# Patient Record
Sex: Male | Born: 1945 | Race: White | Hispanic: No | Smoking: Former smoker
Health system: Southern US, Community
[De-identification: ages and names within clinical notes are randomized; demographics above are authoritative.]

## PROBLEM LIST (undated history)

## (undated) DIAGNOSIS — F329 Major depressive disorder, single episode, unspecified: Secondary | ICD-10-CM

## (undated) DIAGNOSIS — G4730 Sleep apnea, unspecified: Secondary | ICD-10-CM

## (undated) DIAGNOSIS — K219 Gastro-esophageal reflux disease without esophagitis: Secondary | ICD-10-CM

## (undated) DIAGNOSIS — F419 Anxiety disorder, unspecified: Secondary | ICD-10-CM

## (undated) DIAGNOSIS — F32A Depression, unspecified: Secondary | ICD-10-CM

## (undated) DIAGNOSIS — N182 Chronic kidney disease, stage 2 (mild): Secondary | ICD-10-CM

## (undated) DIAGNOSIS — K5790 Diverticulosis of intestine, part unspecified, without perforation or abscess without bleeding: Secondary | ICD-10-CM

## (undated) DIAGNOSIS — J449 Chronic obstructive pulmonary disease, unspecified: Secondary | ICD-10-CM

## (undated) DIAGNOSIS — I10 Essential (primary) hypertension: Secondary | ICD-10-CM

## (undated) DIAGNOSIS — E7800 Pure hypercholesterolemia, unspecified: Secondary | ICD-10-CM

## (undated) DIAGNOSIS — Z9289 Personal history of other medical treatment: Secondary | ICD-10-CM

## (undated) DIAGNOSIS — E079 Disorder of thyroid, unspecified: Secondary | ICD-10-CM

## (undated) DIAGNOSIS — D126 Benign neoplasm of colon, unspecified: Secondary | ICD-10-CM

## (undated) DIAGNOSIS — I2510 Atherosclerotic heart disease of native coronary artery without angina pectoris: Secondary | ICD-10-CM

## (undated) DIAGNOSIS — D649 Anemia, unspecified: Secondary | ICD-10-CM

## (undated) DIAGNOSIS — Z720 Tobacco use: Secondary | ICD-10-CM

## (undated) DIAGNOSIS — M1990 Unspecified osteoarthritis, unspecified site: Secondary | ICD-10-CM

## (undated) DIAGNOSIS — K449 Diaphragmatic hernia without obstruction or gangrene: Secondary | ICD-10-CM

## (undated) HISTORY — DX: Sleep apnea, unspecified: G47.30

## (undated) HISTORY — DX: Essential (primary) hypertension: I10

## (undated) HISTORY — PX: UPPER GASTROINTESTINAL ENDOSCOPY: SHX188

## (undated) HISTORY — DX: Depression, unspecified: F32.A

## (undated) HISTORY — DX: Benign neoplasm of colon, unspecified: D12.6

## (undated) HISTORY — DX: Gastro-esophageal reflux disease without esophagitis: K21.9

## (undated) HISTORY — DX: Personal history of other medical treatment: Z92.89

## (undated) HISTORY — DX: Anemia, unspecified: D64.9

## (undated) HISTORY — DX: Diverticulosis of intestine, part unspecified, without perforation or abscess without bleeding: K57.90

## (undated) HISTORY — DX: Major depressive disorder, single episode, unspecified: F32.9

## (undated) HISTORY — DX: Tobacco use: Z72.0

## (undated) HISTORY — DX: Disorder of thyroid, unspecified: E07.9

## (undated) HISTORY — DX: Chronic obstructive pulmonary disease, unspecified: J44.9

## (undated) HISTORY — DX: Chronic kidney disease, stage 2 (mild): N18.2

## (undated) HISTORY — DX: Anxiety disorder, unspecified: F41.9

## (undated) HISTORY — DX: Atherosclerotic heart disease of native coronary artery without angina pectoris: I25.10

## (undated) HISTORY — PX: COLONOSCOPY: SHX174

## (undated) HISTORY — DX: Unspecified osteoarthritis, unspecified site: M19.90

## (undated) HISTORY — PX: HAND SURGERY: SHX662

## (undated) HISTORY — DX: Diaphragmatic hernia without obstruction or gangrene: K44.9

## (undated) HISTORY — DX: Pure hypercholesterolemia, unspecified: E78.00

---

## 1994-08-06 HISTORY — PX: HERNIA REPAIR: SHX51

## 1997-08-06 HISTORY — PX: CORONARY ARTERY BYPASS GRAFT: SHX141

## 2007-01-07 ENCOUNTER — Ambulatory Visit (HOSPITAL_COMMUNITY): Admission: RE | Admit: 2007-01-07 | Discharge: 2007-01-07 | Payer: Self-pay | Admitting: Cardiovascular Disease

## 2007-02-14 ENCOUNTER — Ambulatory Visit: Payer: Self-pay | Admitting: Emergency Medicine

## 2007-03-24 ENCOUNTER — Ambulatory Visit: Payer: Self-pay | Admitting: Emergency Medicine

## 2007-06-17 ENCOUNTER — Ambulatory Visit: Payer: Self-pay | Admitting: Emergency Medicine

## 2007-09-02 ENCOUNTER — Telehealth (INDEPENDENT_AMBULATORY_CARE_PROVIDER_SITE_OTHER): Payer: Self-pay | Admitting: *Deleted

## 2007-09-04 DIAGNOSIS — Z87891 Personal history of nicotine dependence: Secondary | ICD-10-CM | POA: Insufficient documentation

## 2007-09-04 DIAGNOSIS — I2510 Atherosclerotic heart disease of native coronary artery without angina pectoris: Secondary | ICD-10-CM

## 2007-09-04 DIAGNOSIS — K219 Gastro-esophageal reflux disease without esophagitis: Secondary | ICD-10-CM | POA: Insufficient documentation

## 2007-09-04 DIAGNOSIS — E7800 Pure hypercholesterolemia, unspecified: Secondary | ICD-10-CM

## 2007-09-04 DIAGNOSIS — I25709 Atherosclerosis of coronary artery bypass graft(s), unspecified, with unspecified angina pectoris: Secondary | ICD-10-CM | POA: Insufficient documentation

## 2007-09-04 DIAGNOSIS — E119 Type 2 diabetes mellitus without complications: Secondary | ICD-10-CM

## 2007-09-04 DIAGNOSIS — J449 Chronic obstructive pulmonary disease, unspecified: Secondary | ICD-10-CM

## 2008-01-01 ENCOUNTER — Ambulatory Visit: Payer: Self-pay | Admitting: Emergency Medicine

## 2008-03-17 ENCOUNTER — Telehealth (INDEPENDENT_AMBULATORY_CARE_PROVIDER_SITE_OTHER): Payer: Self-pay | Admitting: *Deleted

## 2008-07-09 ENCOUNTER — Ambulatory Visit: Payer: Self-pay | Admitting: Emergency Medicine

## 2009-01-05 ENCOUNTER — Ambulatory Visit: Payer: Self-pay | Admitting: Emergency Medicine

## 2009-01-21 ENCOUNTER — Ambulatory Visit: Payer: Self-pay | Admitting: Emergency Medicine

## 2009-07-27 ENCOUNTER — Ambulatory Visit: Payer: Self-pay | Admitting: Emergency Medicine

## 2009-11-10 ENCOUNTER — Ambulatory Visit: Payer: Self-pay | Admitting: Emergency Medicine

## 2010-05-23 ENCOUNTER — Ambulatory Visit: Payer: Self-pay | Admitting: Emergency Medicine

## 2010-07-25 ENCOUNTER — Telehealth (INDEPENDENT_AMBULATORY_CARE_PROVIDER_SITE_OTHER): Payer: Self-pay | Admitting: *Deleted

## 2010-09-07 NOTE — Progress Notes (Signed)
Summary: [id number] x 1  Phone Note Call from Patient Call back at Home Phone [phone]   Caller: Patient Call For: byrum Reason for Call: Talk to Nurse Summary of Call: FYI: Patient was to try adding Symbicort 160/4.62mcg 2 puffs two times a day to see if it improves breathing. Patient calling to let Dr. Delton Coombes know symbicort is helping. [facility] [place]  Initial call taken by: Lehman Prom,  July 25, 2010 10:28 AM  Follow-up for Phone Call        [facility] Vernie Murders  July 25, 2010 11:46 AM  Spoke with pt.  He states that his breathing has improved alot since started symbicort with the spiriva.  He states needs rx sent to pharm.  This is okay per last ov instructions- [facility] [place]. Follow-up by: Vernie Murders,  July 25, 2010 2:54 PM    New/Updated Medications: SYMBICORT 160-4.5 MCG/ACT AERO (BUDESONIDE-FORMOTEROL FUMARATE) 2 puffs every 12 hrs, rinse mouth after each use Prescriptions: SYMBICORT 160-4.5 MCG/ACT AERO (BUDESONIDE-FORMOTEROL FUMARATE) 2 puffs every 12 hrs, rinse mouth after each use  #1 x 5   Entered by:   Vernie Murders   Authorized by:   [name] MD   Signed by:   Vernie Murders on 07/25/2010   Method used:   [facility]  [address] [phone][facility] (retail)       [address]       [place], [place]  [address]       Ph: [phone] or [phone]       Fax: [phone]   RxID:   [id number]

## 2010-09-07 NOTE — Assessment & Plan Note (Signed)
Summary: COPD   Visit Type:  Follow-up Primary Provider/Referring Provider:  [facility] [place]  CC:  COPD. The pt restarted Spiriva and says he noticed his breathing was better at first. He does c/o sob with extreme exertion.Marland Kitchen  History of Present Illness: Oscar Robinson is a 65 year old gentleman with known coronary artery disease status post CABG.  Also has a history of significant tobacco abuse and airflow limitation on pulmonary function testing.  He has a positive bronchodilator response.   Here for ROV 01/05/09 -- Has done well in general, does relate an experience when he was mowing a yard. Felt significant exertional dyspnea, not sure if he wheezed. Had to stop to rest. No significant cough. No significant PND or phlegm. Otherwise has felt baseline. Exercises regularly. Taking Symbicort regularly, doesn't bother his throat as badly as before. He has some concern today about the warnings regarding LABA use in asthmatics.   ROV 01/21/09 -- last visit changed Symbicort to Spiriva. He feels that he was better on the Spiriva. Was able to do more, exert more. No new problems since last visit.   ROV 07/27/09 -- Was treated about 2 -3 weeks ago for an acute exacerbation of COPD in setting of a URI. About to finish prednisone and amoxicillin. Now beginning to notice more mucous production for last 2 days, still clear phlegm. Had been on Spiriva, but stopped it when he was given the above + as needed Proventil.   ROV 11/10/09 -- returns for f/u COPD. Last time he was recovering from an AE-COPD. We restarted Spiriva at that visit. He believes that it is probably helping his breathing. He has been able to restart his exercise program. He doesn't have a SABA available at this time.   Current Medications (verified): 1)  Fish Oil 1200 Mg  Caps (Omega-3 Fatty Acids) .Marland Kitchen.. 1 Po Two Times A Day 2)  Metformin Hcl 500 Mg  Tabs (Metformin Hcl) .... Take 1 Tablet By Mouth Two Times A Day 3)   Multivitamins   Tabs (Multiple Vitamin) .... Take 1 Tablet By Mouth Once A Day 4)  Bayer Low Strength 81 Mg  Tbec (Aspirin) .... Take 2 Tabs By Mouth Once Daily 5)  Nexium 40 Mg  Cpdr (Esomeprazole Magnesium) .... Take 1 Tablet By Mouth Once A Day 6)  Toprol Xl 50 Mg  Tb24 (Metoprolol Succinate) .... Take 1 Tablet By Mouth Once A Day 7)  Selenium 100 Mcg  Tabs (Selenium) .... Take 1 Tablet By Mouth Two Times A Day 8)  Altace 10 Mg  Caps (Ramipril) .... Take 1 Tablet By Mouth Once A Day 9)  Welchol 625 Mg  Tabs (Colesevelam Hcl) .... Take 3 Tabs By Mouth Two Times A Day 10)  Humalog 100 Unit/ml  Soln (Insulin Lispro (Human)) .... Use As Directed 11)  Pravachol 40 Mg Tabs (Pravastatin Sodium) .Marland Kitchen.. 1 By Mouth Daily 12)  Doxepin Hcl 50 Mg  Caps (Doxepin Hcl) .... Take 2 Tabs By Mouth At Bedtime 13)  Xanax 0.5 Mg  Tabs (Alprazolam) .... Take 1/2 Tab By Mouth At Bedtime 14)  Lantus 100 Unit/ml  Soln (Insulin Glargine) .... Use As Directed 15)  Spiriva Handihaler 18 Mcg Caps (Tiotropium Bromide Monohydrate) .... Inhale Contents of 1 Capsule Once A Day  Allergies (verified): No Known Drug Allergies  Vital Signs:  Patient profile:   65 year old male Height:      70 inches (177.80 cm) Weight:      191  pounds (86.82 kg) BMI:     27.50 O2 Sat:      95 % on Room air Temp:     98.7 degrees F (37.06 degrees C) oral Pulse rate:   86 / minute BP sitting:   112 / 76  (left arm) Cuff size:   regular  Vitals Entered By: Oscar Robinson CMA (November 10, 2009 3:44 PM)  O2 Sat at Rest %:  95 O2 Flow:  Room air  Physical Exam  General:  well developed, well nourished, in no acute distress Head:  normocephalic and atraumatic Nose:  no deformity, discharge, inflammation, or lesions Mouth:  no deformity or lesions Neck:  no masses, thyromegaly, or abnormal cervical nodes Lungs:  distant, no wheezes Heart:  regular rate and rhythm, S1, S2 without murmurs, rubs, gallops, or clicks Abdomen:  not  examined Msk:  no deformity or scoliosis noted with normal posture Extremities:  no clubbing, cyanosis, edema, or deformity noted Psych:  alert and cooperative; normal mood and affect; normal attention span and concentration   Impression & Recommendations:  Problem # 1:  CHRONIC OBSTRUCTIVE PULMONARY DISEASE (ICD-496) Continue Spiriva + SABA (ProAir given) ROV 6 months or as needed   Medications Added to Medication List This Visit: 1)  Pravachol 40 Mg Tabs (Pravastatin sodium) .Marland Kitchen.. 1 by mouth daily  Other Orders: Est. Patient Level III ([address]109)  Patient Instructions: 1)  Continue Spiriva once daily  2)  Use ProAir 2 puffs up to every 4 hours if needed for shortness of breath 3)  Follow up with Dr Delton Coombes in 6 months or as needed.

## 2010-09-07 NOTE — Assessment & Plan Note (Signed)
Summary: COPD   Visit Type:  Follow-up Copy to:  Dr Sherlyn Lick, Mady Haagensen Primary Jevante Robinson/Referring Oscar Robinson:  [facility] [place]  CC:  6 month follow up, pt is requeting flu vaccine and pneumonia vaccine, pt c/o  sob with exertion, and exercises 5 days a week.  History of Present Illness: [name] is a 65 year old gentleman with known coronary artery disease status post CABG.  Also has a history of significant tobacco abuse and airflow limitation on pulmonary function testing.  He has a positive bronchodilator response.   Here for ROV 01/05/09 -- Has done well in general, does relate an experience when he was mowing a yard. Felt significant exertional dyspnea, not sure if he wheezed. Had to stop to rest. No significant cough. No significant PND or phlegm. Otherwise has felt baseline. Exercises regularly. Taking Symbicort regularly, doesn't bother his throat as badly as before. He has some concern today about the warnings regarding LABA use in asthmatics.   ROV 01/21/09 -- last visit changed Symbicort to Spiriva. He feels that he was better on the Spiriva. Was able to do more, exert more. No new problems since last visit.   ROV 07/27/09 -- Was treated about 2 -3 weeks ago for an acute exacerbation of COPD in setting of a URI. About to finish prednisone and amoxicillin. Now beginning to notice more mucous production for last 2 days, still clear phlegm. Had been on Spiriva, but stopped it when he was given the above + as needed Proventil.   ROV 11/10/09 -- returns for f/u COPD. Last time he was recovering from an AE-COPD. We restarted Spiriva at that visit. He believes that it is probably helping his breathing. He has been able to restart his exercise program. He doesn't have a SABA available at this time.   ROV 05/23/10 -- follows up today for his COPD, dyspnea. He is doing an exercise program. 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 No wheeze or cough during the day. He does cough at night when he lays flat. No real wt gain. No CP. Occas ankle swelling esp when he eats salt.   Preventive Screening-Counseling & Management  Alcohol-Tobacco     Smoking Status: quit     Packs/Day: 2.0     Year Started: 1962     Year Quit: 1999  Current Medications (verified): 1)  Fish Oil 1200 Mg  Caps (Omega-3 Fatty Acids) .Marland Kitchen.. 1 Po Two Times A Day 2)  Metformin Hcl 500 Mg  Tabs (Metformin Hcl) .... Take 2  Tablest By Mouth Two Times A Day 3)  Multivitamins   Tabs (Multiple Vitamin) .... Take 1 Tablet By Mouth Once A Day 4)  Bayer Low Strength 81 Mg  Tbec (Aspirin) .... Take 2 Tabs By Mouth Once Daily 5)  Nexium 40 Mg  Cpdr (Esomeprazole Magnesium) .... Take 1 Tablet By Mouth Once A Day 6)  Toprol Xl 50 Mg  Tb24 (Metoprolol Succinate) .... Take 1 Tablet By Mouth Once A Day 7)  Selenium 100 Mcg  Tabs (Selenium) .... Take 1 Tablet By Mouth Two Times A Day 8)  Altace 10 Mg  Caps (Ramipril) .... Take 1 Tablet By Mouth Once A Day 9)  Welchol 625 Mg  Tabs (Colesevelam Hcl) .... Take 3 Tabs By Mouth Two Times A Day 10)  Humalog 100 Unit/ml  Soln (Insulin Lispro (  Human)) .... Use As Directed 11)  Pravachol 40 Mg Tabs (Pravastatin Sodium) .Marland Kitchen.. 1 By Mouth Daily 12)  Doxepin Hcl 50 Mg  Caps (Doxepin Hcl) .... Take 2 Tabs By Mouth At Bedtime 13)  Xanax 0.5 Mg  Tabs (Alprazolam) .... Take 1/2 Tab By Mouth At Bedtime 14)  Lantus 100 Unit/ml  Soln (Insulin Glargine) .... Use As Directed 15)  Spiriva Handihaler 18 Mcg Caps (Tiotropium Bromide Monohydrate) .... Inhale Contents of 1 Capsule Once A Day  Allergies (verified): No Known Drug Allergies  Social History: Packs/Day:  2.0  Vital Signs:  Patient profile:   65 year old male Height:      70 inches Weight:      184.8 pounds BMI:     26.61 O2 Sat:      94 % on Room air Temp:     98.4 degrees F oral Pulse rate:   91 / minute BP  sitting:   120 / 70  (left arm)  Vitals Entered By: Renold Genta RCP, LPN (May 23, 2010 2:14 PM)  O2 Flow:  Room air  CC: 6 month follow up, pt is requeting flu vaccine and pneumonia vaccine, pt c/o  sob with exertion, exercises 5 days a week Is Patient Diabetic? Yes Comments Medications reviewed with patient Renold Genta RCP, LPN  May 23, 2010 2:14 PM    Physical Exam  General:  well developed, well nourished, in no acute distress Head:  normocephalic and atraumatic Nose:  no deformity, discharge, inflammation, or lesions Mouth:  no deformity or lesions Neck:  no masses, thyromegaly, or abnormal cervical nodes Lungs:  distant, no wheezes Heart:  regular rate and rhythm, S1, S2 without murmurs, rubs, gallops, or clicks Abdomen:  not examined Msk:  no deformity or scoliosis noted with normal posture Extremities:  no clubbing, cyanosis, edema, or deformity noted Psych:  alert and cooperative; normal mood and affect; normal attention span and concentration   Impression & Recommendations:  Problem # 1:  CHRONIC OBSTRUCTIVE PULMONARY DISEASE (ICD-496) Walking oximetry today Continue your Spiriva once daily  We will try adding Symbicort 160/4.49mcg 2 puffs two times a day to see if this improves your breathing. if you feel benefit from the Symbicort please call our ofice so that we can send a prescription to your pharmacy.  Flu Shot and Pneumovax today Follow up with Dr Delton Coombes in 6 months or as needed.   Problem # 2:  CORONARY ARTERY DISEASE (ICD-414.00)  His updated medication list for this problem includes:    Bayer Low Strength 81 Mg Tbec (Aspirin) .Marland Kitchen... Take 2 tabs by mouth once daily    Toprol Xl 50 Mg Tb24 (Metoprolol succinate) .Marland Kitchen... Take 1 tablet by mouth once a day    Altace 10 Mg Caps (Ramipril) .Marland Kitchen... Take 1 tablet by mouth once a day  Medications Added to Medication List This Visit: 1)  Metformin Hcl 500 Mg Tabs (Metformin hcl) .... Take 2  tablest by mouth  two times a day  Other Orders: Est. Patient Level IV (16109) Admin 1st Vaccine (60454) Flu Vaccine 15yrs + (09811) Pneumococcal Vaccine (91478) Admin of Any Addtl Vaccine (29562) Admin 1st Vaccine (State) [phone]) Admin of Any Addtl Vaccine (State) [phone])  Patient Instructions: 1)  Walking oximetry today 2)  Continue your Spiriva once daily  3)  We will try adding Symbicort 160/4.50mcg 2 puffs two times a day to see if this improves your breathing. if you feel benefit from the Symbicort please call  our ofice so that we can send a prescription to your pharmacy.  4)  Flu Shot and Pneumovax today 5)  Follow up with Dr Delton Coombes in 6 months or as needed.  6)  If you continue to have symptoms even on the new medication, we may decide to consult with your cardiologist to insure that they are not associated with your heart.    Pneumovax Vaccine    Vaccine Type: Pneumovax    Site: right deltoid    Mfr: Merck    Dose: 0.5 ml    Route: IM    Given by: Renold Genta RCP, LPN    Exp. Date: 11/20/2012    Lot #: [id number]    VIS given: 07/11/09 version given May 23, 2010.     Flu Vaccine Consent Questions     Do you have a history of severe allergic reactions to this vaccine? no    Any prior history of allergic reactions to egg and/or gelatin? no    Do you have a sensitivity to the preservative Thimersol? no    Do you have a past history of Guillan-Barre Syndrome? no    Do you currently have an acute febrile illness? no    Have you ever had a severe reaction to latex? no    Vaccine information given and explained to patient? yes    Are you currently pregnant? no    Lot Number:[id number]   Exp Date:02/03/2011   Site Given  Left Deltoid IMu1 Armita Galloway RCP, LPN  May 23, 2010 4:10 PM   Orders Added: 1)  Est. Patient Level IV [99214] 2)  Admin 1st Vaccine [90471] 3)  Flu Vaccine 71yrs + [16109] 4)  Pneumococcal Vaccine [90732] 5)  Admin of Any Addtl Vaccine [90472] 6)  Admin  1st Vaccine [facility]) [60454U] 7)  Admin of Any Addtl Vaccine [facility]) [phone]

## 2010-12-05 ENCOUNTER — Other Ambulatory Visit: Payer: Self-pay | Admitting: Emergency Medicine

## 2010-12-19 NOTE — Assessment & Plan Note (Signed)
[facility]                             PULMONARY OFFICE NOTE   NAME:Oscar Robinson                      MRN:          [id number]  DATE:02/14/2007                            DOB:          [birth year]    REASON FOR CONSULTATION:  We were asked by Dr. Susa Griffins to  evaluate [name] for progressive dyspnea.   SUBJECTIVE:  Oscar Robinson is a pleasant 65 year old gentleman with a  history of coronary artery disease status post bypass in 1999, he also  has diabetes mellitus.  He is a former smoker with approximately 60 pack  total history and quit in 1999.  He is referred today for some slowly  progressive dyspnea on exertion.  He noticed that it was becoming  problematic with heavy lifting and heavy exertion about 2 months ago,  but in retrospect he believes that he may have altered his work habits  earlier than that due to difficulties with his breathing.  He is still  able to do cardiovascular exercise at a reasonable pace.  He denies any  cough.  He used to hear some wheezing and he used to cough when he  smoked, but this stopped in 1999.   PAST MEDICAL HISTORY:  1. Diabetes mellitus.  2. Coronary artery disease status post CABG in 1999.  3. Hypercholesterolemia.  4. GERD.   His evaluation of his dyspnea has included stress perfusion imaging  which was reassuring in May of this year.  He has also had pulmonary  function testing performed on January 07, 2007, this showed airflow  limitation with a bronchodilator response as detailed below.  He is  referred for evaluation of his presumed COPD.   ALLERGIES:  NO KNOWN DRUG ALLERGIES.   CURRENT MEDICATIONS:  1. Fish oil 1200 mg daily.  2. Metformin 500 mg b.i.d.  3. Vitamin once daily.  4. Aspirin 243 mg daily.  5. Nexium 40 mg daily.  6. Toprol XL 50 mg daily.  7. Selenium 100 mg b.i.d.  8. Altace 10 mg daily.  9. Welchol 625 mg three tablets b.i.d.  10.Humalog sliding scale insulin.  11.Pravastatin 20 mg daily.  12.Alprazolam 0.5 mg one-half tablet nightly.  13.Lantus insulin 15 units subcutaneously once daily.  14.Doxepin 100 mg nightly.   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  He quit smoking in 1999, he has a 60 pack year  total history.  He occasionally drinks beer or wine.  He denies any  significant occupational exposures or recent travel.   FAMILY HISTORY:  Noncontributory.   REVIEW OF SYSTEMS:  As per the HPI.   PHYSICAL EXAMINATION:  GENERAL:  This is a pleasant, well-appearing  gentleman in no distress.  Weight is 181 pounds, temperature is 97.9,  blood pressure 124/72, heart rate 107, SPO2 96% on room air.  HEENT:  Oropharynx is clear.  NECK:  Without stridor or lymphadenopathy.  LUNGS:  Have a few bilateral lower lobe crackles, there is no wheezing.  HEART:  Regular without murmur.  ABDOMEN:  Soft,  nontender, nondistended, positive bowel sounds.  EXTREMITIES:  Have no cyanosis, clubbing, or edema.  NEUROLOGIC:  He has a nonfocal exam.   Pulmonary function testing performed on January 07, 2007 showed severe  airflow limitation with an FEV1 of 1.56 or 49% of predicted and a  positive bronchodilator response.  Lung volumes and diffusion capacity  were not tested.   IMPRESSION:  1. Newly diagnosed airflow limitation that likely represents chronic      obstructive pulmonary disease with a reactive airways component.  2. Known coronary artery disease with a recent reassuring stress      imaging study.   PLANS:  1. Chest x-ray to establish baseline.  2. I will initiate a trial of Symbicort 160/4.5 mcg two puffs b.i.d.      given his bronchodilator responsiveness on his PFTs.  3. Walking oximetry today to ensure that he does not have occult      desaturation.  4. Followup in one month to assess his progress on a bronchodilator.     [name], MD  Electronically Signed    RSB/MedQ  DD:  03/03/2007  DT: 03/03/2007  Job #: [id number]   cc:   Gerlene Burdock A. Alanda Amass, M.D.  Talmadge Coventry, M.D.

## 2010-12-19 NOTE — Assessment & Plan Note (Signed)
[facility]                             PULMONARY OFFICE NOTE   NAME:Santiesteban, NAFTALI CARCHI                      MRN:          [id number]  DATE:03/24/2007                            DOB:          [birth year]    SUBJECTIVE:  Mr. Pannone is a 65 year old gentleman with history of  coronary disease and airflow limitation identified on pulmonary function  testing back in June of this year.  At our last visit, I started him  empirically on Symbicort 160/4.5 two puffs b.i.d. to see if he derived  any benefit.  He had been having dyspnea with heavy exertion.  He tells  me today that he is not exactly sure whether Symbicort has been helpful  because he has not done the heavy exertion that was giving him problems.  He has been able to do his usual cardiovascular workouts that include a  treadmill and elliptical trainer, but these activities were not giving  him problems when we met.  He has supplemental short-acting beta  agonists that he has not required.   CURRENT MEDICATIONS:  1. Fish oil 1200 mg daily.  2. Metformin 500 mg b.i.d.  3. Vitamin once daily.  4. Aspirin 81 mg three tablets daily.  5. Nexium 40 mg daily.  6. Toprol-XL 50 mg daily.  7. Selenium 100 mg b.i.d.  8. Altace 10 mg daily.  9. Welchol 625 mg three tablets b.i.d.  10.Humalog sliding scale insulin.  11.Pravastatin 20 mg daily.  12.Doxepin 100 mg q.h.s.  13.Alprazolam 0.25 mg q.h.s.  14.Lantus insulin 50 units daily.  15.Symbicort 160/4.5 two puffs b.i.d.   PHYSICAL EXAMINATION:  GENERAL:  This is a pleasant, well-appearing  gentleman who is in no distress.  VITAL SIGNS:  His weight is 183 pounds, temperature 96.9, blood pressure  124/78, heart rate 89, SpO2 98% on room air.  HEENT:  Oropharynx is benign.  Moist.  NECK:  Supple without lymphadenopathy or stridor.  LUNGS:  Clear without any wheezing.  He does have a few lower lobe  inspiratory crackles that clear with deep inspiration.  HEART:  Regular without murmur.  ABDOMEN:  Soft, nontender, nondistended.  Positive bowel sounds.  EXTREMITIES:  No clubbing, cyanosis, or edema.   CHEST X-RAY:  A chest x-ray performed today shows some very mild  interstitial prominence without any evidence of overt pulmonary edema or  obvious infiltrates.  He is status post CABG.   IMPRESSION:  1. COPD with a reactive airways component, currently on Symbicort.  It      is not clear as to whether he has derived any true benefit, but I      suspect that he will need a standing bronchodilator.  I will      continue him on Symbicort at this time, and I have asked him to go      back to performing some of the heavy activity that he was doing      when he first noticed his dyspnea.  I believe this is probably the      best way for [place] to determine whether  the medication is giving him      any true benefit.  If he continues to have shortness of breath with      these activities, then it may be necessary to change him to      anticholinergic medication such as Spiriva.  I will follow up with      him in 3 months to assess our progress.  He will call me to me know      if he is continuing to have shortness of breath with activity.  If      he has any worsening of symptoms, we will see each other sooner.     [name], MD  Electronically Signed    RSB/MedQ  DD: 03/24/2007  DT: 03/24/2007  Job #: [id number]   cc:   Talmadge Coventry, M.D.  Leavy A. Alanda Amass, M.D.

## 2010-12-19 NOTE — Assessment & Plan Note (Signed)
[facility]                             PULMONARY OFFICE NOTE   NAME:Oscar Robinson, Oscar Robinson                      MRN:          [id number]  DATE:06/17/2007                            DOB:          July 07, 1946    PULMONARY FOLLOWUP VISIT   SUBJECTIVE:  Oscar Robinson is a 65 year old gentleman with known coronary  artery disease status post CABG.  Also has a history of significant  tobacco abuse and airflow limitation on pulmonary function testing.  He  has a positive bronchodilator response, and for this reason, I started  him on Symbicort.  He returns today and tells me that he has been able  to be quite active.  In fact, he is currently remodeling a house that he  has purchased, that includes painting, knocking out walls, and other  significant labor.  He wears a mask when it is dusty to avoid inhaled  exposures.  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  He tells me that he does get short of breath when he  does this much exertion.  He has been using his Symbicort reliably.  He  is still not clear as to whether the Symbicort has given him any true  benefit with regard to his breathing or his exertional tolerance.  He  does not have a rescue inhaler.  He has not noticed any wheezing or  other new symptoms.  He has not had any exacerbation since our last  visit.   MEDICATIONS:  1. Fish oil 1200 mg daily.  2. Metformin 500 mg b.i.d.  3. Vitamin once daily.  4. Aspirin 81 mg 3 tablets once daily.  5. Nexium 40 mg daily.  6. Toprol XL 50 mg daily.  7. Selenium 100 mg b.i.d.  8. Altace 10 mg daily.  9. Welchol 625 mg 3 tablets b.i.d.  10.Humalog sliding-scale insulin.  11.Pravastatin 20 mg daily.  12.Doxepin 100 mg nightly.  13.Alprazolam 0.25 mg nightly.  14.Lantus 15 units daily.  15.Symbicort 150/4.5 two puffs b.i.d.   EXAM:  GENERAL:  This is a pleasant well-appearing man who is in no  distress.  His weight is  185 pounds, temperature 97.8, blood pressure 140/86, heart  rate 79, SpO2 99% on room air.  HEENT:  Benign.  He has no oral lesions.  Posterior pharynx is clear.  NECK:  Supple without lymphadenopathy or stridor.  LUNGS:  Distant, but clear to auscultation bilaterally.  He has no  crackles or wheezing.  HEART:  Regular without a murmur.  ABDOMEN:  Benign.  EXTREMITIES:  No cyanosis, clubbing, or edema.   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  He has been treated with Symbicort and has taken it  reliably.  It is still not clear to me whether he has had a true benefit  from being on standing bronchodilator.  I have asked him to stop the  medication for 2 to 3 weeks and to keep track of his exertional  tolerance and his symptoms.  If he misses the  Symbicort, then he will  restart it after 3 weeks has gone by.  I gave him Ventolin to use as a  rescue inhaler in the interim.  He will also keep track of how often he  needs this medication.  He will call my office to let me know if he  restarts the Symbicort.  Otherwise, I will follow up with him in 4  months or sooner if he has any trouble in the interim.     [name], MD  Electronically Signed    RSB/MedQ  DD: 06/17/2007  DT: 06/17/2007  Job #: [id number]   cc:   Oscar Robinson, M.D.  Oscar Robinson, M.D.

## 2011-02-06 ENCOUNTER — Other Ambulatory Visit: Payer: Self-pay | Admitting: Emergency Medicine

## 2011-04-09 ENCOUNTER — Other Ambulatory Visit: Payer: Self-pay | Admitting: Emergency Medicine

## 2011-09-13 ENCOUNTER — Telehealth: Payer: Self-pay | Admitting: Emergency Medicine

## 2011-09-13 NOTE — Telephone Encounter (Signed)
I spoke with the pt. He is c/o sore throat, chest congestion, increased SOB, and productive cough with yellow phlegm/ He was last seen 05-2010. He states that he was given azithromycin yesterday by his PCP but he does not feel any relief.  I advised that the medication has not been in his system long enough to take effect. I advised to continue the medication and also scheduled the pt for an appt to see RB on Monday for f/u. I advised the pt if he gets worse over the weekend to call on call or go to urgent care or ER. Pt states understanding. Carron Curie, CMA

## 2011-09-14 ENCOUNTER — Encounter: Payer: Self-pay | Admitting: Emergency Medicine

## 2011-09-17 ENCOUNTER — Encounter: Payer: Self-pay | Admitting: Emergency Medicine

## 2011-09-17 ENCOUNTER — Ambulatory Visit (INDEPENDENT_AMBULATORY_CARE_PROVIDER_SITE_OTHER): Payer: BC Managed Care – PPO | Admitting: Emergency Medicine

## 2011-09-17 VITALS — BP 136/72 | HR 70 | Temp 97.8°F | Ht 70.0 in | Wt 186.2 lb

## 2011-09-17 DIAGNOSIS — J449 Chronic obstructive pulmonary disease, unspecified: Secondary | ICD-10-CM

## 2011-09-17 MED ORDER — PREDNISONE 20 MG PO TABS: 40.0000 mg | ORAL_TABLET | Freq: Every day | ORAL | Status: AC

## 2011-09-17 NOTE — Progress Notes (Signed)
Oscar Robinson is a 66 year old gentleman with known coronary artery disease status post CABG. Also has a history of significant tobacco abuse and airflow limitation on pulmonary function testing. He has a positive bronchodilator response.   Here for ROV 01/05/09 -- Has done well in general, does relate an experience when he was mowing a yard. Felt significant exertional dyspnea, not sure if he wheezed. Had to stop to rest. No significant cough. No significant PND or phlegm. Otherwise has felt baseline. Exercises regularly. Taking Symbicort regularly, doesn't bother his throat as badly as before. He has some concern today about the warnings regarding LABA use in asthmatics.   ROV 01/21/09 -- last visit changed Symbicort to Spiriva. He feels that he was better on the Spiriva. Was able to do more, exert more. No new problems since last visit.   ROV 07/27/09 -- Was treated about 2 -3 weeks ago for an acute exacerbation of COPD in setting of a URI. About to finish prednisone and amoxicillin. Now beginning to notice more mucous production for last 2 days, still clear phlegm. Had been on Spiriva, but stopped it when he was given the above + as needed Proventil.   ROV 11/10/09 -- returns for f/u COPD. Last time he was recovering from an AE-COPD. We restarted Spiriva at that visit. He believes that it is probably helping his breathing. He has been able to restart his exercise program. He doesn't have a SABA available at this time.   ROV 05/23/10 -- follows up today for his COPD, dyspnea. He is doing an exercise program. 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 No wheeze or cough during the day. He does cough at night when he lays flat. No real wt gain. No CP. Occas ankle swelling esp when he eats salt.   ROV 09/17/11 -- follows up today for his COPD, dyspnea. Last seen 05/2010. Has been doing fairly well, was exposed to  URI about 3 weeks ago, sore throat, congestion that progressed to dyspnea. Was rx with azithro and has improved, cough and breathing both better. Did not receive steroids. Feels back to baseline. He is not very reliable with symbicort because it sometimes gives him sore throat. Tells me also that sometimes he skips the Spiriva and takes the Symbicort (seems to help his breathing more).    EXAM:   Gen: Pleasant, well-nourished, in no distress,  normal affect  ENT: No lesions,  mouth clear,  oropharynx clear, no postnasal drip  Neck: No JVD, no TMG, no carotid bruits  Lungs: No use of accessory muscles, some end-exp wheeze B  Cardiovascular: RRR, heart sounds normal, no murmur or gallops, no peripheral edema  Musculoskeletal: No deformities, no cyanosis or clubbing  Neuro: alert, non focal  Skin: Warm, no lesions or rashes  CHRONIC OBSTRUCTIVE PULMONARY DISEASE Likely had an AE, treated w azithro w some improvement but not back to baseline and w some wheeze. Will give brief pred burst and follow. He is changing back and forth between symbicort and Spiriva whenever his throat gets sore. He thinks the Symbicort helps him more but also causes the throat sx. Will give him Dulera samples to try to see if better tolerated. He will perform the change after he finishes the Pred. Will call us if Elwin Sleight is preferred.

## 2011-09-17 NOTE — Assessment & Plan Note (Signed)
Likely had an AE, treated w azithro w some improvement but not back to baseline and w some wheeze. Will give brief pred burst and follow. He is changing back and forth between symbicort and Spiriva whenever his throat gets sore. He thinks the Symbicort helps him more but also causes the throat sx. Will give him Dulera samples to try to see if better tolerated. He will perform the change after he finishes the Pred. Will call us if Elwin Sleight is preferred.

## 2011-09-17 NOTE — Patient Instructions (Signed)
Take prednisone for 5 days and then stop After the prednisoen is finished, try changing to [facility] 2 puffs twice a day to see if this is easier on your throat. If so then we can consider changing to [facility] - call our office to let us know if you want to switch Stay on Spiriva daily for now; we may decide to stop it  Follow with Dr Delton Coombes in 6 months or sooner if you have any problems

## 2011-09-19 ENCOUNTER — Telehealth: Payer: Self-pay | Admitting: Emergency Medicine

## 2011-09-19 NOTE — Telephone Encounter (Signed)
I spoke pt and he states his BS was high this morning. I advised him that prednisone can do this. Pt states instead of taking both pred tablets at the same time he is wanting to take 1 in the am and 1 in the pm. I advised him that would be finePt voiced his understanding and had no questions

## 2011-10-09 ENCOUNTER — Other Ambulatory Visit: Payer: Self-pay | Admitting: Emergency Medicine

## 2011-12-17 ENCOUNTER — Telehealth: Payer: Self-pay | Admitting: Emergency Medicine

## 2011-12-17 NOTE — Telephone Encounter (Signed)
Have him stay on the symbicort bid and tell him he needs to take it every day including the weekends

## 2011-12-17 NOTE — Telephone Encounter (Signed)
Pt advised. Kaliann Coryell, CMA  

## 2011-12-17 NOTE — Telephone Encounter (Signed)
I spoke with pt and calling to give update on inhalers. he states he can't tell a difference in using the symbicort and dulera. His breathing is better using it then going w/o it. He is currently on the symbicort. He has noticed when he doesn't use it he has a harder time breathing at night. He states he normally does not use his inhalers on the weekend. Please advise RB thanks

## 2012-03-06 ENCOUNTER — Other Ambulatory Visit: Payer: Self-pay | Admitting: Emergency Medicine

## 2012-03-06 MED ORDER — TIOTROPIUM BROMIDE MONOHYDRATE 18 MCG IN CAPS: 18.0000 ug | ORAL_CAPSULE | Freq: Every day | RESPIRATORY_TRACT | Status: DC

## 2012-03-18 ENCOUNTER — Ambulatory Visit (INDEPENDENT_AMBULATORY_CARE_PROVIDER_SITE_OTHER): Payer: Medicare Other | Admitting: Emergency Medicine

## 2012-03-18 ENCOUNTER — Encounter: Payer: Self-pay | Admitting: Emergency Medicine

## 2012-03-18 VITALS — BP 126/72 | HR 90 | Temp 97.9°F | Ht 70.0 in | Wt 189.8 lb

## 2012-03-18 DIAGNOSIS — J449 Chronic obstructive pulmonary disease, unspecified: Secondary | ICD-10-CM

## 2012-03-18 NOTE — Assessment & Plan Note (Addendum)
Fairly stable on current regimen - does have some occas hoarse voice, cough that could relate to symbicort.  - continue the symbicort + spiriva - discussed possible PSG - he wants to wait, reconsider next time - rov 6 mon

## 2012-03-18 NOTE — Progress Notes (Signed)
Oscar Robinson is a 66 year old gentleman with known coronary artery disease status post CABG. Also has a history of significant tobacco abuse and airflow limitation on pulmonary function testing. He has a positive bronchodilator response.   ROV 05/23/10 -- follows up today for his COPD, dyspnea. He is doing an exercise program. 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 No wheeze or cough during the day. He does cough at night when he lays flat. No real wt gain. No CP. Occas ankle swelling esp when he eats salt.   ROV 09/17/11 -- follows up today for his COPD, dyspnea. Last seen 05/2010. Has been doing fairly well, was exposed to URI about 3 weeks ago, sore throat, congestion that progressed to dyspnea. Was rx with azithro and has improved, cough and breathing both better. Did not receive steroids. Feels back to baseline. He is not very reliable with symbicort because it sometimes gives him sore throat. Tells me also that sometimes he skips the Spiriva and takes the Symbicort (seems to help his breathing more).    ROV 03/18/12 -- Hx COPD with BD responsiveness and chronic dyspnea, CAD.  He has been doing fairly well, is having congestion at night, doesn't wake him from sleep. Continues to take Symbicort and Spiriva, sometimes skips the pm dose Symbicort. Uses SABA very rarely. He snores and has had witnessed apneas by his wife.    EXAM:  Filed Vitals:   03/18/12 1454  BP: 126/72  Pulse: 90  Temp: 97.9 F (36.6 C)   Gen: Pleasant, well-nourished, in no distress,  normal affect  ENT: No lesions,  mouth clear,  oropharynx clear, no postnasal drip  Neck: No JVD, no TMG, no carotid bruits  Lungs: No use of accessory muscles, some end-exp wheeze B  Cardiovascular: RRR, heart sounds normal, no murmur or gallops, no peripheral edema  Musculoskeletal: No deformities, no cyanosis or clubbing  Neuro:  alert, non focal  Skin: Warm, no lesions or rashes  CHRONIC OBSTRUCTIVE PULMONARY DISEASE Fairly stable on current regimen - does have some occas hoarse voice, cough that could relate to symbicort.  - continue the symbicort + spiriva - discussed possible PSG - he wants to wait, reconsider next time - rov 6 mon

## 2012-03-18 NOTE — Patient Instructions (Addendum)
Please continue your Spiriva and Symbicort as you are taking them We will discuss possible sleep study next visit.  Follow with Dr Delton Coombes in 6 months or sooner if you have any problems

## 2012-04-03 ENCOUNTER — Other Ambulatory Visit: Payer: Self-pay | Admitting: Orthopedic Surgery

## 2012-04-08 ENCOUNTER — Encounter (HOSPITAL_BASED_OUTPATIENT_CLINIC_OR_DEPARTMENT_OTHER): Payer: Self-pay | Admitting: *Deleted

## 2012-04-08 NOTE — Progress Notes (Signed)
Pt sees cardiology in [place]-will get notes and ekg- Will need [address]

## 2012-04-08 NOTE — Progress Notes (Signed)
Called [facility]-no cxr or ekg in over a yr-pt will come in to get all this done

## 2012-04-09 ENCOUNTER — Ambulatory Visit (HOSPITAL_COMMUNITY): Admission: RE | Admit: 2012-04-09 | Payer: Medicare Other | Source: Ambulatory Visit

## 2012-04-09 ENCOUNTER — Encounter (HOSPITAL_BASED_OUTPATIENT_CLINIC_OR_DEPARTMENT_OTHER)
Admission: RE | Admit: 2012-04-09 | Discharge: 2012-04-09 | Disposition: A | Discharge status: Home / Self Care | Payer: Medicare Other | Source: Ambulatory Visit

## 2012-04-09 ENCOUNTER — Other Ambulatory Visit: Payer: Self-pay | Admitting: Orthopedic Surgery

## 2012-04-09 ENCOUNTER — Ambulatory Visit
Admission: RE | Admit: 2012-04-09 | Discharge: 2012-04-09 | Disposition: A | Discharge status: Home / Self Care | Payer: Medicare Other | Source: Ambulatory Visit | Attending: Orthopedic Surgery | Admitting: Orthopedic Surgery

## 2012-04-09 DIAGNOSIS — Z01811 Encounter for preprocedural respiratory examination: Secondary | ICD-10-CM

## 2012-04-09 LAB — BASIC METABOLIC PANEL
CO2: 27 mEq/L (ref 19–32)
Chloride: 101 mEq/L (ref 96–112)
Creatinine, Ser: 0.99 mg/dL (ref 0.50–1.35)
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
## 2012-04-10 ENCOUNTER — Encounter (HOSPITAL_BASED_OUTPATIENT_CLINIC_OR_DEPARTMENT_OTHER): Payer: Self-pay | Admitting: *Deleted

## 2012-04-10 ENCOUNTER — Ambulatory Visit (HOSPITAL_BASED_OUTPATIENT_CLINIC_OR_DEPARTMENT_OTHER): Payer: Medicare Other | Admitting: *Deleted

## 2012-04-10 ENCOUNTER — Ambulatory Visit (HOSPITAL_BASED_OUTPATIENT_CLINIC_OR_DEPARTMENT_OTHER)
Admission: RE | Admit: 2012-04-10 | Discharge: 2012-04-10 | Disposition: A | Discharge status: Home / Self Care | Payer: Medicare Other | Source: Ambulatory Visit | Attending: Orthopedic Surgery | Admitting: Orthopedic Surgery

## 2012-04-10 ENCOUNTER — Encounter (HOSPITAL_BASED_OUTPATIENT_CLINIC_OR_DEPARTMENT_OTHER)
Admission: RE | Disposition: A | Discharge status: Home / Self Care | Payer: Self-pay | Source: Ambulatory Visit | Attending: Orthopedic Surgery

## 2012-04-10 DIAGNOSIS — Z951 Presence of aortocoronary bypass graft: Secondary | ICD-10-CM | POA: Insufficient documentation

## 2012-04-10 DIAGNOSIS — K219 Gastro-esophageal reflux disease without esophagitis: Secondary | ICD-10-CM | POA: Insufficient documentation

## 2012-04-10 DIAGNOSIS — E119 Type 2 diabetes mellitus without complications: Secondary | ICD-10-CM | POA: Insufficient documentation

## 2012-04-10 DIAGNOSIS — Z01812 Encounter for preprocedural laboratory examination: Secondary | ICD-10-CM | POA: Insufficient documentation

## 2012-04-10 DIAGNOSIS — I2510 Atherosclerotic heart disease of native coronary artery without angina pectoris: Secondary | ICD-10-CM | POA: Insufficient documentation

## 2012-04-10 DIAGNOSIS — M720 Palmar fascial fibromatosis [Dupuytren]: Secondary | ICD-10-CM | POA: Insufficient documentation

## 2012-04-10 DIAGNOSIS — Z01810 Encounter for preprocedural cardiovascular examination: Secondary | ICD-10-CM | POA: Insufficient documentation

## 2012-04-10 DIAGNOSIS — J4489 Other specified chronic obstructive pulmonary disease: Secondary | ICD-10-CM | POA: Insufficient documentation

## 2012-04-10 DIAGNOSIS — Z794 Long term (current) use of insulin: Secondary | ICD-10-CM | POA: Insufficient documentation

## 2012-04-10 DIAGNOSIS — J449 Chronic obstructive pulmonary disease, unspecified: Secondary | ICD-10-CM | POA: Insufficient documentation

## 2012-04-10 HISTORY — PX: DUPUYTREN CONTRACTURE RELEASE: SHX1478

## 2012-04-10 SURGERY — RELEASE, DUPUYTREN CONTRACTURE
Anesthesia: General | Site: Hand | Laterality: Right | Wound class: Clean

## 2012-04-10 MED ORDER — ONDANSETRON HCL 4 MG/2ML IJ SOLN
INTRAMUSCULAR | Status: DC | PRN
  Administered 2012-04-10: 4 mg via INTRAVENOUS

## 2012-04-10 MED ORDER — LACTATED RINGERS IV SOLN
INTRAVENOUS | Status: DC
  Administered 2012-04-10 (×3): via INTRAVENOUS

## 2012-04-10 MED ORDER — CEPHALEXIN 500 MG PO CAPS: 500.0000 mg | ORAL_CAPSULE | Freq: Three times a day (TID) | ORAL | Status: AC

## 2012-04-10 MED ORDER — EPHEDRINE SULFATE 50 MG/ML IJ SOLN
INTRAMUSCULAR | Status: DC | PRN
  Administered 2012-04-10 (×3): 5 mg via INTRAVENOUS

## 2012-04-10 MED ORDER — ROPIVACAINE HCL 5 MG/ML IJ SOLN
INTRAMUSCULAR | Status: DC | PRN
  Administered 2012-04-10: 25 mL

## 2012-04-10 MED ORDER — CHLORHEXIDINE GLUCONATE 4 % EX LIQD: 60.0000 mL | Freq: Once | CUTANEOUS | Status: DC

## 2012-04-10 MED ORDER — MIDAZOLAM HCL 2 MG/2ML IJ SOLN
1.0000 mg | INTRAMUSCULAR | Status: DC | PRN
  Administered 2012-04-10: 2 mg via INTRAVENOUS

## 2012-04-10 MED ORDER — LIDOCAINE HCL (CARDIAC) 20 MG/ML IV SOLN
INTRAVENOUS | Status: DC | PRN
  Administered 2012-04-10: 50 mg via INTRAVENOUS

## 2012-04-10 MED ORDER — LIDOCAINE HCL 1 % IJ SOLN
INTRAMUSCULAR | Status: DC | PRN
  Administered 2012-04-10: 2 mL via INTRADERMAL

## 2012-04-10 MED ORDER — CEFAZOLIN SODIUM-DEXTROSE 2-3 GM-% IV SOLR
2.0000 g | Freq: Once | INTRAVENOUS | Status: AC
  Administered 2012-04-10: 2 g via INTRAVENOUS

## 2012-04-10 MED ORDER — METOCLOPRAMIDE HCL 5 MG/ML IJ SOLN
INTRAMUSCULAR | Status: DC | PRN
  Administered 2012-04-10: 10 mg via INTRAVENOUS

## 2012-04-10 MED ORDER — FENTANYL CITRATE 0.05 MG/ML IJ SOLN
INTRAMUSCULAR | Status: DC | PRN
  Administered 2012-04-10 (×2): 25 ug via INTRAVENOUS

## 2012-04-10 MED ORDER — SILVER SULFADIAZINE 1 % EX CREA
TOPICAL_CREAM | CUTANEOUS | Status: DC | PRN
  Administered 2012-04-10: 1 via TOPICAL

## 2012-04-10 MED ORDER — OXYCODONE-ACETAMINOPHEN 5-325 MG PO TABS: ORAL_TABLET | ORAL | Status: DC

## 2012-04-10 MED ORDER — DEXAMETHASONE SODIUM PHOSPHATE 10 MG/ML IJ SOLN
INTRAMUSCULAR | Status: DC | PRN
  Administered 2012-04-10: 4 mg via INTRAVENOUS

## 2012-04-10 MED ORDER — PROPOFOL 10 MG/ML IV BOLUS
INTRAVENOUS | Status: DC | PRN
  Administered 2012-04-10: 20 mg via INTRAVENOUS
  Administered 2012-04-10: 200 mg via INTRAVENOUS

## 2012-04-10 MED ORDER — FENTANYL CITRATE 0.05 MG/ML IJ SOLN
50.0000 ug | INTRAMUSCULAR | Status: DC | PRN
  Administered 2012-04-10: 100 ug via INTRAVENOUS

## 2012-04-10 SURGICAL SUPPLY — 54 items
BANDAGE ADHESIVE 1X3 (GAUZE/BANDAGES/DRESSINGS) IMPLANT
BANDAGE CONFORM 3  STR LF (GAUZE/BANDAGES/DRESSINGS) ×1 IMPLANT
BANDAGE ELASTIC 3 VELCRO ST LF (GAUZE/BANDAGES/DRESSINGS) ×2 IMPLANT
BANDAGE GAUZE ELAST BULKY 4 IN (GAUZE/BANDAGES/DRESSINGS) ×4 IMPLANT
BLADE MINI RND TIP GREEN BEAV (BLADE) IMPLANT
BLADE SURG 15 STRL LF DISP TIS (BLADE) ×1 IMPLANT
BLADE SURG 15 STRL SS (BLADE) ×2
BNDG CMPR 9X4 STRL LF SNTH (GAUZE/BANDAGES/DRESSINGS) ×1
BNDG CMPR MD 5X2 ELC HKLP STRL (GAUZE/BANDAGES/DRESSINGS) ×1
BNDG ELASTIC 2 VLCR STRL LF (GAUZE/BANDAGES/DRESSINGS) ×1 IMPLANT
BNDG ESMARK 4X9 LF (GAUZE/BANDAGES/DRESSINGS) ×1 IMPLANT
BRUSH SCRUB EZ PLAIN DRY (MISCELLANEOUS) ×2 IMPLANT
CLOTH BEACON ORANGE TIMEOUT ST (SAFETY) ×2 IMPLANT
CORDS BIPOLAR (ELECTRODE) ×2 IMPLANT
COVER MAYO STAND STRL (DRAPES) ×2 IMPLANT
COVER TABLE BACK 60X90 (DRAPES) ×2 IMPLANT
CUFF TOURNIQUET SINGLE 18IN (TOURNIQUET CUFF) ×1 IMPLANT
DECANTER SPIKE VIAL GLASS SM (MISCELLANEOUS) IMPLANT
DRAPE EXTREMITY T 121X128X90 (DRAPE) ×2 IMPLANT
DRAPE SURG 17X23 STRL (DRAPES) ×2 IMPLANT
DRSG EMULSION OIL 3X3 NADH (GAUZE/BANDAGES/DRESSINGS) ×3 IMPLANT
GLOVE BIO SURGEON STRL SZ 6.5 (GLOVE) ×2 IMPLANT
GLOVE BIOGEL M STRL SZ7.5 (GLOVE) ×2 IMPLANT
GLOVE BIOGEL PI IND STRL 6.5 (GLOVE) IMPLANT
GLOVE BIOGEL PI INDICATOR 6.5 (GLOVE) ×1
GLOVE ORTHO TXT STRL SZ7.5 (GLOVE) ×2 IMPLANT
GOWN PREVENTION PLUS XLARGE (GOWN DISPOSABLE) ×3 IMPLANT
GOWN PREVENTION PLUS XXLARGE (GOWN DISPOSABLE) ×4 IMPLANT
LOOP VESSEL MAXI BLUE (MISCELLANEOUS) ×2 IMPLANT
NDL HYPO 25X1 1.5 SAFETY (NEEDLE) IMPLANT
NEEDLE 27GAX1X1/2 (NEEDLE) IMPLANT
NEEDLE HYPO 25X1 1.5 SAFETY (NEEDLE) IMPLANT
NS IRRIG 1000ML POUR BTL (IV SOLUTION) ×2 IMPLANT
PACK BASIN DAY SURGERY FS (CUSTOM PROCEDURE TRAY) ×2 IMPLANT
PAD CAST 3X4 CTTN HI CHSV (CAST SUPPLIES) ×1 IMPLANT
PADDING CAST ABS 4INX4YD NS (CAST SUPPLIES) ×1
PADDING CAST ABS COTTON 4X4 ST (CAST SUPPLIES) ×1 IMPLANT
PADDING CAST COTTON 3X4 STRL (CAST SUPPLIES) ×4
SPLINT PLASTER CAST XFAST 3X15 (CAST SUPPLIES) ×4 IMPLANT
SPLINT PLASTER XTRA FASTSET 3X (CAST SUPPLIES) ×1
SPONGE GAUZE 4X4 12PLY (GAUZE/BANDAGES/DRESSINGS) ×3 IMPLANT
STOCKINETTE 4X48 STRL (DRAPES) ×2 IMPLANT
STRIP CLOSURE SKIN 1/2X4 (GAUZE/BANDAGES/DRESSINGS) ×1 IMPLANT
SUT ETHILON 5 0 P 3 18 (SUTURE) ×3
SUT NYLON ETHILON 5-0 P-3 1X18 (SUTURE) ×2 IMPLANT
SUT SILK 4 0 PS 2 (SUTURE) ×2 IMPLANT
SUT VIC AB 4-0 P2 18 (SUTURE) IMPLANT
SYR 3ML 23GX1 SAFETY (SYRINGE) IMPLANT
SYR BULB 3OZ (MISCELLANEOUS) ×1 IMPLANT
SYR CONTROL 10ML LL (SYRINGE) IMPLANT
TOWEL OR 17X24 6PK STRL BLUE (TOWEL DISPOSABLE) ×4 IMPLANT
TRAY DSU PREP LF (CUSTOM PROCEDURE TRAY) ×2 IMPLANT
UNDERPAD 30X30 INCONTINENT (UNDERPADS AND DIAPERS) ×2 IMPLANT
WATER STERILE IRR 1000ML POUR (IV SOLUTION) ×1 IMPLANT

## 2012-04-10 NOTE — Transfer of Care (Signed)
Immediate Anesthesia Transfer of Care Note  Patient: Oscar Robinson  Procedure(s) Performed: Procedure(s) (LRB) with comments: DUPUYTREN CONTRACTURE RELEASE (Right) - palm, ring and small fingers dupuytrens contracture release  Patient Location: PACU  Anesthesia Type: GA combined with regional for post-op pain  Level of Consciousness: sedated and patient cooperative  Airway & Oxygen Therapy: Patient Spontanous Breathing and Patient connected to face mask oxygen  Post-op Assessment: Report given to PACU RN and Post -op Vital signs reviewed and stable  Post vital signs: Reviewed and stable  Complications: No apparent anesthesia complications

## 2012-04-10 NOTE — Anesthesia Postprocedure Evaluation (Signed)
Anesthesia Post Note  Patient: Oscar Robinson  Procedure(s) Performed: Procedure(s) (LRB): DUPUYTREN CONTRACTURE RELEASE (Right)  Anesthesia type: General  Patient location: PACU  Post pain: Pain level controlled  Post assessment: Patient's Cardiovascular Status Stable  Last Vitals:  Filed Vitals:   04/10/12 1644  BP: 144/77  Pulse: 60  Temp:   Resp: 15    Post vital signs: Reviewed and stable  Level of consciousness: alert  Complications: No apparent anesthesia complications

## 2012-04-10 NOTE — Brief Op Note (Signed)
04/10/2012  3:02 PM  PATIENT:  Oscar Robinson  66 y.o. male  PRE-OPERATIVE DIAGNOSIS:  dupuytrens contracture right palm (long, ring and smalpretendinous fibers), ring finger and recurrent dupuytren's contracture of right small finger   POST-OPERATIVE DIAGNOSIS:  same as preop  PROCEDURE:  Procedure(s) (LRB) with comments: DUPUYTREN CONTRACTURE RELEASE (Right) - palm, ring and small fingers dupuytrens contracture release  SURGEON:  Surgeon(s) and Role:    * Wyn Forster., MD - Primary  PHYSICIAN ASSISTANT:   ASSISTANTS: Mallory Shirk.A-C   ANESTHESIA:   general  EBL:  Total I/O In: 1000 [I.V.:1000] Out: -   BLOOD ADMINISTERED:none  DRAINS: none   LOCAL MEDICATIONS USED:  Ropivacaine plexus block  SPECIMEN:  Excision  DISPOSITION OF SPECIMEN:  PATHOLOGY  COUNTS:  YES  TOURNIQUET:  * Missing tourniquet times found for documented tourniquets in log:  [id number] *  DICTATION: .Other Dictation: Dictation Number [id number]  PLAN OF CARE: Discharge to home after PACU  PATIENT DISPOSITION:  PACU - hemodynamically stable.

## 2012-04-10 NOTE — Anesthesia Preprocedure Evaluation (Addendum)
Anesthesia Evaluation  Patient identified by MRN, date of birth, ID band Patient awake    Reviewed: Allergy & Precautions, H&P , NPO status , Patient's Chart, lab work & pertinent test results, reviewed documented beta blocker date and time   Airway Mallampati: II TM Distance: >3 FB Neck ROM: full    Dental   Pulmonary COPD breath sounds clear to auscultation        Cardiovascular + CAD and + CABG Rhythm:regular     Neuro/Psych negative neurological ROS  negative psych ROS   GI/Hepatic Neg liver ROS, GERD-  Medicated and Controlled,  Endo/Other  negative endocrine ROSdiabetes, Insulin Dependent  Renal/GU negative Renal ROS  negative genitourinary   Musculoskeletal   Abdominal   Peds  Hematology negative hematology ROS (+)   Anesthesia Other Findings See surgeon's H&P   Reproductive/Obstetrics negative OB ROS                           Anesthesia Physical Anesthesia Plan  ASA: III  Anesthesia Plan: General   Post-op Pain Management:    Induction: Intravenous  Airway Management Planned: LMA  Additional Equipment:   Intra-op Plan:   Post-operative Plan: Extubation in OR  Informed Consent: I have reviewed the patients History and Physical, chart, labs and discussed the procedure including the risks, benefits and alternatives for the proposed anesthesia with the patient or authorized representative who has indicated his/her understanding and acceptance.   Dental Advisory Given  Plan Discussed with: CRNA and Surgeon  Anesthesia Plan Comments:         Anesthesia Quick Evaluation

## 2012-04-10 NOTE — Op Note (Signed)
[id number] 

## 2012-04-10 NOTE — Progress Notes (Signed)
Assisted Dr. Frederick with right, ultrasound guided, supraclavicular block. Side rails up, monitors on throughout procedure. See vital signs in flow sheet. Tolerated Procedure well. 

## 2012-04-10 NOTE — Anesthesia Procedure Notes (Addendum)
Anesthesia Regional Block:  Supraclavicular block  Pre-Anesthetic Checklist: ,, timeout performed, Correct Patient, Correct Site, Correct Laterality, Correct Procedure, Correct Position, site marked, Risks and benefits discussed,  Surgical consent,  Pre-op evaluation,  At surgeon's request and post-op pain management  Laterality: Right  Prep: chloraprep       Needles:   Needle Type: Other   (Arrow Echogenic)   Needle Length: 9cm  Needle Gauge: 21    Additional Needles:  Procedures: ultrasound guided Supraclavicular block Narrative:  Start time: 04/10/2012 11:22 AM End time: 04/10/2012 11:30 AM Injection made incrementally with aspirations every 5 mL.  Performed by: Personally  Anesthesiologist: C Frederick  Additional Notes: Ultrasound guidance used to: id relevant anatomy, confirm needle position, local anesthetic spread, avoidance of vascular puncture. Picture saved. No complications. Block performed personally by Janetta Hora. Gelene Mink, MD    Supraclavicular block Procedure Name: LMA Insertion Date/Time: 04/10/2012 12:50 PM Performed by: Meyer Russel Pre-anesthesia Checklist: Patient identified, Emergency Drugs available, Suction available and Patient being monitored Patient Re-evaluated:Patient Re-evaluated prior to inductionOxygen Delivery Method: Circle System Utilized Preoxygenation: Pre-oxygenation with 100% oxygen Intubation Type: IV induction Ventilation: Mask ventilation without difficulty LMA: LMA inserted LMA Size: 4.0 Number of attempts: 1 Airway Equipment and Method: bite block Placement Confirmation: positive ETCO2 and breath sounds checked- equal and bilateral Tube secured with: Tape Dental Injury: Teeth and Oropharynx as per pre-operative assessment

## 2012-04-11 ENCOUNTER — Encounter (HOSPITAL_BASED_OUTPATIENT_CLINIC_OR_DEPARTMENT_OTHER): Payer: Self-pay | Admitting: Orthopedic Surgery

## 2012-04-11 NOTE — Op Note (Signed)
NAME:  KHAMARION, BJELLAND NO.:  [id number]  MEDICAL RECORD NO.:  [id number]  LOCATION:  XRAY                         FACILITY:  [facility]  PHYSICIAN:  Katy Fitch. Ermie Glendenning, M.D. 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  OPERATIONS: 1. Resection of palmar fibromatosis, pretendinous fibers long, ring,     and small fingers, right hand. 2. Resection of a very complex Dupuytren palmar fibromatosis from ring     finger with V-Y advancement flaps. 3. Resection of extremely difficult recurrent Dupuytren fibromatosis     of small finger involving flexion contractures of proximal     interphalangeal and distal interphalangeal joints with arduous     dissection of ulnar neurovascular bundles to relieve flexion     contracture.  OPERATING SURGEON:  Katy Fitch. Tonny Isensee, MD  ASSISTANT:  Marveen Reeks. Dasnoit, PA-C.  ANESTHESIA:  General by LMA supplemented by a ropivacaine plexus block placed preoperatively by Dr. Gelene Mink with ultrasound control.  INDICATIONS:  Krishiv Sandler is a 66 year old gentleman who was referred by his primary care physician and his endocrinologist for evaluation and management of a complex persistent/recurrent Dupuytren palmar fibromatosis involving his right hand.  He has had prior surgery at [facility] involving the palm and small  finger.  He had had left hand surgery performed by Dr. Thedore Mins in [facility] for disease of the palm and small finger.  He returned with a very significant flexion contracture of his right ring finger involving the PIP joint which is now flexed 80 degrees with flexion contractures of the MP joints of the long, ring, and small fingers due to palmar disease in the pretendinous fibers.  He had a 50-degree flexion contracture of his small finger, DIP joint and a 40-degree flexion contracture of the PIP joint due to recurrent disease in the dermis and ulnar aspect of the finger.  Preoperatively, I had a very detailed informed consent with Mr. Mogel reviewing his multiple medical problems as well as the complexity of his Dupuytren palmar fibromatosis predicament.  He is fully aware that he presents an extraordinarily difficult combination of problems including diabetes, severe palmar fibromatosis, coronary artery disease, and obstructive airway disease.  We have advised him that we will make a best effort to palliate his palmar fibromatosis by resection of his palmar disease relieving his MP flexion contractures and correcting his ring finger proximal interphalangeal flexion contracture as best as possible.  He understands that a repeat exploration of the small finger with the abundant scar and profound Dupuytren diathesis that he brings to our office is fraught with significant risk.  Preoperatively, we advised him that he could end up with an ischemic finger or even lose  part or all of the small finger with repeat exploration.  We have no idea as to any technical issues that were experienced with his prior surgery at [facility].  After informed consent, he is brought to the operating at this time anticipating maximum palliation of this predicament.  Questions regarding the anticipated surgery invited and answered in detail.  No guarantees were made or  implied.  PROCEDURE:  Irving Bloor was brought to room #1 of [facility] and placed in supine position on the operating table.  Preoperatively, he was interviewed by Dr. Gelene Mink, who after detailed anesthesia informed consent, placed a ropivacaine plexus block for perioperative comfort.  In room #1 under Dr. Thornton Dales direct supervision, general anesthesia by LMA technique was induced followed by routine Betadine scrub and paint of the right upper extremity.  2 g of Ancef were administered as an IV prophylactic antibiotic preoperatively.  Following routine surgical time-out, the right arm and hand were exsanguinated with an Esmarch bandage and arterial tourniquet inflated to 220 mmHg.  Brunner zigzag incisions were planned with resection of the prior surgical scars were necessary.  The procedure commenced with meticulous dissection of the palmar fascia and the palm identifying the superficial palmar arch and the common digital vessels to the long, ring, and small fingers.  There was profound scarring involving the common digital nerve and artery to the ring and small fingers which required nearly 1 hour of dissection to safely expose the common digital nerve, digital artery, radial proper digital nerve, and artery of the small finger and radial proper digital artery and nerve of the ring finger.  Mr. [place] had extensive disease infiltrating the dermis and had quite a significant involvement of the flexor retinaculum with origin of several spiral bands from the A2 pulley which is an unusual variation of Dupuytren's.  After meticulous resection of all pathologic fascia in the ring finger to the level of the distal middle phalanx, we were able to fully extend the MP and PIP joints to 0 degrees.  No flexor sheath release was accomplished.  The skin wounds were then repaired with a V-Y advancement flap technique with corner sutures of 5-0 nylon and interrupted sutures of  5-0 nylon. Attention was then directed to the small finger, where a very difficult dissection of the ulnar and central aspect of the finger was accomplished identifying the ulnar proper digital nerve and artery invested in profound scar over the proximal phalanx followed by removal of several lateral sheet fascial nodules and a central cord as well as a ulnar fascial nodule that was causing the majority of the flexion contracture of the DIP joint.  Mr. Adelstein's pathologic fibromatosis is infiltrating into the dermis and is particularly difficult at the sites of recurrence.  At some point in the future, he may be benefited by a partial revision amputation of the small finger if he develops recurrent severe flexion contracture of the PIP or DIP joints.  At the conclusion of the dissection of the small finger, the tourniquet was released with immediate capillary refill and recovery of turgor and the pulp.  The skin wounds of the small finger then repaired with a corner suture of 5-0 nylon and interrupted suture of 5-0 nylon.  The wound was held with the hand elevated for approximately 10 minutes.  The tourniquet time was 1 hour and 58 minutes for the complete dissection of the palm and ring and small fingers.  A voluminous gauze dressing was applied with  Adaptic, Silvadene, sterile gauze, sterile Kerlix, Webril, and a volar plaster splint maintaining the MP joints in full extension.  There were no apparent complications.  For aftercare, he will be discharged to the care of a close friend.  He will resume all his routine medications for diabetes, cholesterol, coronary artery disease, obstructive pulmonary disease, and reflux.     Katy Fitch Vani Gunner, M.D.     RVS/MEDQ  D:  04/10/2012  T:  04/11/2012  Job:  [id number]  cc:   Dorisann Frames, M.D. [name], MD

## 2012-09-16 ENCOUNTER — Ambulatory Visit (INDEPENDENT_AMBULATORY_CARE_PROVIDER_SITE_OTHER): Payer: Medicare Other | Admitting: Emergency Medicine

## 2012-09-16 ENCOUNTER — Encounter: Payer: Self-pay | Admitting: Emergency Medicine

## 2012-09-16 VITALS — BP 100/60 | HR 126 | Temp 99.5°F | Ht 68.0 in | Wt 193.2 lb

## 2012-09-16 DIAGNOSIS — J449 Chronic obstructive pulmonary disease, unspecified: Secondary | ICD-10-CM

## 2012-09-16 MED ORDER — DOXYCYCLINE HYCLATE 100 MG PO TABS: 100.0000 mg | ORAL_TABLET | Freq: Two times a day (BID) | ORAL | Status: DC

## 2012-09-16 MED ORDER — PREDNISONE 10 MG PO TABS: ORAL_TABLET | ORAL | Status: DC

## 2012-09-16 NOTE — Patient Instructions (Addendum)
Please start new prednisone prescription and take to completion Take doxycycline 100mg  twice a day for 7 days Try using decongestants that contain either chlorpheniramine or brompheniramine Continue your inhaled medications as you are taking them.  Follow with Dr Delton Coombes in 1 month

## 2012-09-16 NOTE — Progress Notes (Signed)
Oscar Robinson is a 67 year old gentleman with known coronary artery disease status post CABG. Also has a history of significant tobacco abuse and airflow limitation on pulmonary function testing. He has a positive bronchodilator response.   ROV 05/23/10 -- follows up today for his COPD, dyspnea. He is doing an exercise program. 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 No wheeze or cough during the day. He does cough at night when he lays flat. No real wt gain. No CP. Occas ankle swelling esp when he eats salt.   ROV 09/17/11 -- follows up today for his COPD, dyspnea. Last seen 05/2010. Has been doing fairly well, was exposed to URI about 3 weeks ago, sore throat, congestion that progressed to dyspnea. Was rx with azithro and has improved, cough and breathing both better. Did not receive steroids. Feels back to baseline. He is not very reliable with symbicort because it sometimes gives him sore throat. Tells me also that sometimes he skips the Spiriva and takes the Symbicort (seems to help his breathing more).    ROV 03/18/12 --  COPD, dyspnea.  He has been doing fairly well, is having congestion at night, doesn't wake him from sleep. Continues to take Symbicort and Spiriva, sometimes skips the pm dose Symbicort. Uses SABA very rarely. He snores and has had witnessed apneas by his wife.   ROV 09/16/12 -- COPD, dyspnea, BD responsiveness. Presents today noting that he developed cough, congestion, malaise about 10 days ago. PCP dis flu swab > negative. Was started on pred taper a week ago - seemed to help him, then was exposed to person with URI over the last weekend. He is now having more cough again, chest pain from the cough. The phlegm is yellow to pink, no frank blood.     EXAM:  Filed Vitals:   09/16/12 1421  BP: 100/60  Pulse: 126  Temp: 99.5 F (37.5 C)   Gen: Pleasant, well-nourished, in no  distress,  normal affect  ENT: No lesions,  mouth clear,  oropharynx clear, no postnasal drip  Neck: No JVD, no TMG, no carotid bruits  Lungs: No use of accessory muscles, some end-exp wheeze B  Cardiovascular: RRR, heart sounds normal, no murmur or gallops, no peripheral edema  Musculoskeletal: No deformities, no cyanosis or clubbing  Neuro: alert, non focal  Skin: Warm, no lesions or rashes  CHRONIC OBSTRUCTIVE PULMONARY DISEASE With an AE, only partially treated with a pred taper. Likely needs a longer taper and rx for bronchitis.  - doxy x 7 days - longer pred taper - same BDs - rov 1 month

## 2012-09-16 NOTE — Assessment & Plan Note (Signed)
With an AE, only partially treated with a pred taper. Likely needs a longer taper and rx for bronchitis.  - doxy x 7 days - longer pred taper - same BDs - rov 1 month

## 2012-10-20 ENCOUNTER — Ambulatory Visit: Payer: Medicare Other | Admitting: Emergency Medicine

## 2012-10-28 ENCOUNTER — Other Ambulatory Visit: Payer: Self-pay | Admitting: Emergency Medicine

## 2012-11-06 ENCOUNTER — Other Ambulatory Visit: Payer: Self-pay | Admitting: *Deleted

## 2012-11-06 MED ORDER — TIOTROPIUM BROMIDE MONOHYDRATE 18 MCG IN CAPS: 18.0000 ug | ORAL_CAPSULE | Freq: Every day | RESPIRATORY_TRACT | Status: DC

## 2012-11-17 ENCOUNTER — Ambulatory Visit (INDEPENDENT_AMBULATORY_CARE_PROVIDER_SITE_OTHER): Payer: Medicare Other | Admitting: Emergency Medicine

## 2012-11-17 ENCOUNTER — Encounter: Payer: Self-pay | Admitting: Emergency Medicine

## 2012-11-17 VITALS — BP 130/80 | HR 78 | Temp 97.7°F | Ht 69.0 in | Wt 189.4 lb

## 2012-11-17 DIAGNOSIS — J449 Chronic obstructive pulmonary disease, unspecified: Secondary | ICD-10-CM

## 2012-11-17 NOTE — Progress Notes (Signed)
Oscar Robinson is a 67 year old gentleman with known coronary artery disease status post CABG. Also has a history of significant tobacco abuse and airflow limitation on pulmonary function testing. He has a positive bronchodilator response.   ROV 05/23/10 -- follows up today for his COPD, dyspnea. He is doing an exercise program. 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 No wheeze or cough during the day. He does cough at night when he lays flat. No real wt gain. No CP. Occas ankle swelling esp when he eats salt.   ROV 09/17/11 -- follows up today for his COPD, dyspnea. Last seen 05/2010. Has been doing fairly well, was exposed to URI about 3 weeks ago, sore throat, congestion that progressed to dyspnea. Was rx with azithro and has improved, cough and breathing both better. Did not receive steroids. Feels back to baseline. He is not very reliable with symbicort because it sometimes gives him sore throat. Tells me also that sometimes he skips the Spiriva and takes the Symbicort (seems to help his breathing more).    ROV 03/18/12 --  COPD, dyspnea.  He has been doing fairly well, is having congestion at night, doesn't wake him from sleep. Continues to take Symbicort and Spiriva, sometimes skips the pm dose Symbicort. Uses SABA very rarely. He snores and has had witnessed apneas by his wife.   ROV 09/16/12 -- COPD, dyspnea, BD responsiveness. Presents today noting that he developed cough, congestion, malaise about 10 days ago. PCP did flu swab > negative. Was started on pred taper a week ago - seemed to help him, then was exposed to person with URI over the last weekend. He is now having more cough again, chest pain from the cough. The phlegm is yellow to pink, no frank blood.    ROV 11/17/12 -- COPD, dyspnea, BD responsiveness.  F/u after URI's as above, AE-COPD. We extended his pred taper and treated w doxy >>  feels much better, probably back to baseline. He is on spiriva and symbicort. 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 He is coughing rarely, has some mucous production esp when he is supine at night.    EXAM:  Filed Vitals:   11/17/12 1432  BP: 130/80  Pulse: 78  Temp: 97.7 F (36.5 C)   Gen: Pleasant, well-nourished, in no distress,  normal affect  ENT: No lesions,  mouth clear,  oropharynx clear, no postnasal drip  Neck: No JVD, no TMG, no carotid bruits  Lungs: No use of accessory muscles, some end-exp wheeze B  Cardiovascular: RRR, heart sounds normal, no murmur or gallops, no peripheral edema  Musculoskeletal: No deformities, no cyanosis or clubbing  Neuro: alert, non focal  Skin: Warm, no lesions or rashes  CHRONIC OBSTRUCTIVE PULMONARY DISEASE - continue Spiriva + Symbicort - SABA prn.  - continue exercise regimen - 6 mon

## 2012-11-17 NOTE — Patient Instructions (Addendum)
Please continue your Spiriva and Symbicort as you are taking them Use albuterol as needed Follow with Dr Delton Coombes in 6 months or sooner if you have any problems.

## 2012-11-17 NOTE — Assessment & Plan Note (Signed)
-   continue Spiriva + Symbicort - SABA prn.  - continue exercise regimen - 6 mon

## 2013-01-14 ENCOUNTER — Other Ambulatory Visit: Payer: Self-pay | Admitting: Emergency Medicine

## 2013-05-01 ENCOUNTER — Ambulatory Visit (INDEPENDENT_AMBULATORY_CARE_PROVIDER_SITE_OTHER): Payer: Medicare Other | Admitting: Emergency Medicine

## 2013-05-01 ENCOUNTER — Encounter: Payer: Self-pay | Admitting: Emergency Medicine

## 2013-05-01 VITALS — BP 130/90 | HR 94 | Temp 98.4°F | Ht 69.0 in | Wt 193.2 lb

## 2013-05-01 DIAGNOSIS — Z23 Encounter for immunization: Secondary | ICD-10-CM

## 2013-05-01 DIAGNOSIS — J449 Chronic obstructive pulmonary disease, unspecified: Secondary | ICD-10-CM

## 2013-05-01 DIAGNOSIS — I2510 Atherosclerotic heart disease of native coronary artery without angina pectoris: Secondary | ICD-10-CM

## 2013-05-01 NOTE — Patient Instructions (Addendum)
We will perform spirometry and walking oximetry today We will try changing your symbicort to [facility] to see if you prefer this.  We will refer you to see Dr Anne Fu with cardiology  Call Dr Janean Sark at [phone] to initiate an appointment with him Follow with Dr Delton Coombes in 1 month

## 2013-05-01 NOTE — Progress Notes (Signed)
Oscar Robinson is a 67 year old gentleman with known coronary artery disease status post CABG. Also has a history of significant tobacco abuse and airflow limitation on pulmonary function testing. Oscar Robinson has a positive bronchodilator response.   ROV 05/23/10 -- follows up today for his COPD, dyspnea. Oscar Robinson is doing an exercise program. 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 No wheeze or cough during the day. Oscar Robinson does cough at night when Oscar Robinson lays flat. No real wt gain. No CP. Occas ankle swelling esp when Oscar Robinson eats salt.   ROV 09/17/11 -- follows up today for his COPD, dyspnea. Last seen 05/2010. Has been doing fairly well, was exposed to URI about 3 weeks ago, sore throat, congestion that progressed to dyspnea. Was rx with azithro and has improved, cough and breathing both better. Did not receive steroids. Feels back to baseline. Oscar Robinson is not very reliable with symbicort because it sometimes gives him sore throat. Tells me also that sometimes Oscar Robinson skips the Spiriva and takes the Symbicort (seems to help his breathing more).    ROV 03/18/12 --  COPD, dyspnea.  Oscar Robinson has been doing fairly well, is having congestion at night, doesn't wake him from sleep. Continues to take Symbicort and Spiriva, sometimes skips the pm dose Symbicort. Uses SABA very rarely. Oscar Robinson snores and has had witnessed apneas by his wife.   ROV 09/16/12 -- COPD, dyspnea, BD responsiveness. Presents today noting that Oscar Robinson developed cough, congestion, malaise about 10 days ago. PCP did flu swab > negative. Was started on pred taper a week ago - seemed to help him, then was exposed to person with URI over the last weekend. Oscar Robinson is now having more cough again, chest pain from the cough. The phlegm is yellow to pink, no frank blood.    ROV 11/17/12 -- COPD, dyspnea, BD responsiveness.  F/u after URI's as above, AE-COPD. We extended his pred taper and treated w doxy >>  feels much better, probably back to baseline. Oscar Robinson is on spiriva and symbicort. 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 Oscar Robinson is coughing rarely, has some mucous production esp when Oscar Robinson is supine at night.   ROV 05/01/13 -- COPD, dyspnea, BD responsiveness. Oscar Robinson has a hx CAD/CABG in '99. Returns for f/u. Oscar Robinson is noticing more exertional SOB over the last several months. No new cough, wheeze or adjunct symptoms. Denies CP or angina. 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 Oscar Robinson has gained 5-10 lbs. Spiriva + symbicort are his maintenance. Oscar Robinson has a cardiologist in [place], but not sure Oscar Robinson want to follow there.     EXAM:  Filed Vitals:   05/01/13 1132  BP: 130/90  Pulse: 94  Temp: 98.4 F (36.9 C)   Gen: Pleasant, well-nourished, in no distress,  normal affect  ENT: No lesions,  mouth clear,  oropharynx clear, no postnasal drip  Neck: No JVD, no TMG, no carotid bruits  Lungs: No use of accessory muscles, some end-exp wheeze B  Cardiovascular: RRR, heart sounds normal, no murmur or gallops, no peripheral edema  Musculoskeletal: No deformities, no cyanosis or clubbing  Neuro: alert, non focal  Skin: Warm, no lesions or rashes  CHRONIC OBSTRUCTIVE PULMONARY DISEASE - repeat spirometry today to compare w priors - trial change symbicort to breo - walking oximetry today  - will refer to Dr Anne Fu w cardiology to eval of his cad  as a component of dyspnea.  - flu shot  - rov 1  CORONARY ARTERY DISEASE No angina sx, but Oscar Robinson does have exertional dyspnea w hx CABG - Oscar Robinson does not want to be seen in [place], will refer to see Dr Donato Schultz to be evaluated

## 2013-05-01 NOTE — Assessment & Plan Note (Signed)
-   repeat spirometry today to compare w priors - trial change symbicort to breo - walking oximetry today  - will refer to Dr Anne Fu w cardiology to eval of his cad as a component of dyspnea.  - flu shot  - rov 1

## 2013-05-01 NOTE — Assessment & Plan Note (Signed)
No angina sx, but he does have exertional dyspnea w hx CABG - he does not want to be seen in [place], will refer to see Dr Donato Schultz to be evaluated

## 2013-05-20 ENCOUNTER — Encounter: Payer: Self-pay | Admitting: Cardiology

## 2013-05-22 ENCOUNTER — Ambulatory Visit (INDEPENDENT_AMBULATORY_CARE_PROVIDER_SITE_OTHER): Payer: Medicare Other | Admitting: Cardiology

## 2013-05-22 ENCOUNTER — Encounter: Payer: Self-pay | Admitting: Cardiology

## 2013-05-22 VITALS — BP 144/76 | HR 94 | Ht 69.0 in | Wt 189.0 lb

## 2013-05-22 DIAGNOSIS — E119 Type 2 diabetes mellitus without complications: Secondary | ICD-10-CM

## 2013-05-22 DIAGNOSIS — I208 Other forms of angina pectoris: Secondary | ICD-10-CM

## 2013-05-22 DIAGNOSIS — I2510 Atherosclerotic heart disease of native coronary artery without angina pectoris: Secondary | ICD-10-CM

## 2013-05-22 DIAGNOSIS — R0600 Dyspnea, unspecified: Secondary | ICD-10-CM

## 2013-05-22 DIAGNOSIS — Z87891 Personal history of nicotine dependence: Secondary | ICD-10-CM

## 2013-05-22 DIAGNOSIS — J449 Chronic obstructive pulmonary disease, unspecified: Secondary | ICD-10-CM

## 2013-05-22 DIAGNOSIS — R0609 Other forms of dyspnea: Secondary | ICD-10-CM

## 2013-05-22 DIAGNOSIS — E7800 Pure hypercholesterolemia, unspecified: Secondary | ICD-10-CM

## 2013-05-22 MED ORDER — AMLODIPINE BESYLATE 5 MG PO TABS: 5.0000 mg | ORAL_TABLET | Freq: Every day | ORAL | Status: DC

## 2013-05-22 MED ORDER — NITROGLYCERIN 0.4 MG SL SUBL: 0.4000 mg | SUBLINGUAL_TABLET | SUBLINGUAL | Status: DC | PRN

## 2013-05-22 NOTE — Progress Notes (Signed)
[address] [place], [place]  [address] Phone: [phone] Fax:  [phone]  Date:  05/22/2013   ID:  Oscar Robinson, DOB 02/22/1946, MRN [id number]  PCP:  Nonnie Done., MD   History of Present Illness: Oscar Robinson is a 67 y.o. male with coronary artery disease status post bypass surgery 1999, significant tobacco use, COPD with recent increasing shortness of breath, dyspnea on exertion here at the request of Dr. Ortencia Kick for further evaluation.  Dyspnea with exertional with no chest pain, currently moderate in severity, relieved with rest with no obvious exacerbating factors, non-positional. No palpitations. Prior to CABG had symptoms of dyspnea mostly. Fatigue.   Last stress test was about 2 years ago he thinks in [place] at [facility]. [place] Spaniel it was OK.    Wt Readings from Last 3 Encounters:  05/22/13 189 lb (85.73 kg)  05/01/13 193 lb 3.2 oz (87.635 kg)  11/17/12 189 lb 6.4 oz (85.911 kg)     Past Medical History  Diagnosis Date  . GERD (gastroesophageal reflux disease)   . Hypercholesteremia   . COPD (chronic obstructive pulmonary disease)   . Diabetes mellitus   . Tobacco abuse   . Coronary artery disease     Past Surgical History  Procedure Laterality Date  . Coronary artery bypass graft  1999  . Hand surgery      lt palm,ring finger,  . Hernia repair  1996    lt/rt ing hernia  . Dupuytren contracture release  04/10/2012    Procedure: DUPUYTREN CONTRACTURE RELEASE;  Surgeon: Wyn Forster., MD;  Location: [facility];  Service: Orthopedics;  Laterality: Right;  palm, ring and small fingers dupuytrens contracture release    Current Outpatient Prescriptions  Medication Sig Dispense Refill  . ALPRAZolam (XANAX) 0.5 MG tablet Take 0.25 mg by mouth at bedtime.      Marland Kitchen amLODipine (NORVASC) 5 MG tablet Take 5 mg by mouth daily.      Marland Kitchen aspirin 81 MG tablet Take 162 mg by mouth daily.       . budesonide-formoterol  (SYMBICORT) 160-4.5 MCG/ACT inhaler Inhale 2 puffs into the lungs 2 (two) times daily.      Marland Kitchen co-enzyme Q-10 30 MG capsule Take 30 mg by mouth daily.      . colesevelam (WELCHOL) 625 MG tablet Take 1,875 mg by mouth 2 (two) times daily with a meal.      . doxepin (SINEQUAN) 50 MG capsule Take 100 mg by mouth daily.      Marland Kitchen esomeprazole (NEXIUM) 40 MG capsule Take 40 mg by mouth daily.      . Fluticasone Furoate-Vilanterol (BREO ELLIPTA) 100-25 MCG/INH AEPB Inhale into the lungs every morning.      . insulin glargine (LANTUS) 100 UNIT/ML injection Inject into the skin as directed. 25 units      . insulin lispro (HUMALOG) 100 UNIT/ML injection Inject into the skin as directed.      . metFORMIN (GLUCOPHAGE) 500 MG tablet Take 500 mg by mouth daily with breakfast.       . metoprolol succinate (TOPROL-XL) 50 MG 24 hr tablet Take 50 mg by mouth daily. Take with or immediately following a meal.      . Multiple Vitamin (MULTIVITAMIN) capsule Take 1 capsule by mouth daily.      . Omega-3 Fatty Acids (FISH OIL) 1200 MG CAPS Take 1 capsule by mouth 2 (two) times daily.      Marland Kitchen  pravastatin (PRAVACHOL) 40 MG tablet Take 40 mg by mouth daily.      Marland Kitchen PROAIR HFA 108 (90 BASE) MCG/ACT inhaler 1-2 puffs every 4 (four) hours as needed.      . ramipril (ALTACE) 10 MG capsule Take 10 mg by mouth daily.      Marland Kitchen tiotropium (SPIRIVA HANDIHALER) 18 MCG inhalation capsule Place 1 capsule (18 mcg total) into inhaler and inhale daily.  30 capsule  5   No current facility-administered medications for this visit.    Allergies:   No Known Allergies  Social History:  The patient  reports that he quit smoking about 15 years ago. His smoking use included Cigarettes. He has a 70 pack-year smoking history. He has never used smokeless tobacco. He reports that he drinks alcohol.   No family history on file.  ROS:  Please see the history of present illness.   Denies any bleeding, syncope, stroke like symptoms, dysphagia, orthopnea,  PND. No rashes or   All other systems reviewed and negative.   PHYSICAL EXAM: VS:  BP 144/76  Pulse 94  Ht 5\' 9"  (1.753 m)  Wt 189 lb (85.73 kg)  BMI 27.9 kg/m2 Well nourished, well developed, in no acute distress HEENT: normal, [place]/AT, EOMI Neck: no JVD, normal carotid upstroke, no bruit Cardiac:  normal S1, S2; RRR; no murmur Lungs:  clear to auscultation bilaterally, no wheezing, rhonchi or rales although he does have prolonged expiratory phase/distant breath sounds noted Abd: soft, nontender, no hepatomegaly, no bruits Ext: no edema, 2+ distal pulses Skin: warm and dry GU: deferred Neuro: no focal abnormalities noted, AAO x 3  EKG:    sinus rhythm, 73 with nonspecific ST-T wave changes   CATH: ?2002 - May have had collateral flow. No PCI  ASSESSMENT AND PLAN:  1. Angina/shortness of breath-dyspnea on exertion was a previous anginal equivalent for him prior to his bypass surgery in 1999. I would like to check a nuclear stress test to ensure that he does not have any evidence of ischemia. Continue with current aggressive secondary prevention. 2. COPD-Dr. Delton Coombes. Medications reviewed 3. Hypertension-I will refill his amlodipine for him. 4. Angina-nitroglycerin refilled. 5. Coronary artery disease-as described above-prior bypass 1999 6. Diabetes-insulin-dependent, primary physician.  Signed, Donato Schultz, MD [facility]  05/22/2013 11:48 AM

## 2013-05-22 NOTE — Patient Instructions (Addendum)
Your physician has requested that you have en exercise stress myoview. For further information please visit [email or web address] Please follow instruction sheet, as given.  Your physician wants you to follow-up in: 6 months with Dr. Anne Fu. You will receive a reminder letter in the mail two months in advance. If you don't receive a letter, please call our office to schedule the follow-up appointment.  Your physician recommends that you continue on your current medications as directed. Please refer to the Current Medication list given to you today.

## 2013-05-22 NOTE — Addendum Note (Signed)
Addended by: Donato Schultz C on: 05/22/2013 01:22 PM   Modules accepted: Level of Service

## 2013-06-10 ENCOUNTER — Ambulatory Visit: Payer: Medicare Other | Admitting: Emergency Medicine

## 2013-06-11 ENCOUNTER — Ambulatory Visit (HOSPITAL_COMMUNITY): Payer: Medicare Other | Attending: Cardiology | Admitting: Radiology

## 2013-06-11 ENCOUNTER — Encounter: Payer: Self-pay | Admitting: Cardiology

## 2013-06-11 ENCOUNTER — Ambulatory Visit (INDEPENDENT_AMBULATORY_CARE_PROVIDER_SITE_OTHER): Payer: Medicare Other | Admitting: Cardiology

## 2013-06-11 ENCOUNTER — Encounter (HOSPITAL_COMMUNITY): Payer: Medicare Other

## 2013-06-11 ENCOUNTER — Other Ambulatory Visit: Payer: Self-pay | Admitting: Cardiology

## 2013-06-11 VITALS — BP 145/96 | HR 79 | Ht 69.0 in

## 2013-06-11 VITALS — BP 141/82 | Ht 69.0 in | Wt 188.0 lb

## 2013-06-11 DIAGNOSIS — Z87891 Personal history of nicotine dependence: Secondary | ICD-10-CM | POA: Insufficient documentation

## 2013-06-11 DIAGNOSIS — Z01812 Encounter for preprocedural laboratory examination: Secondary | ICD-10-CM

## 2013-06-11 DIAGNOSIS — Z794 Long term (current) use of insulin: Secondary | ICD-10-CM | POA: Insufficient documentation

## 2013-06-11 DIAGNOSIS — J449 Chronic obstructive pulmonary disease, unspecified: Secondary | ICD-10-CM

## 2013-06-11 DIAGNOSIS — Z951 Presence of aortocoronary bypass graft: Secondary | ICD-10-CM | POA: Insufficient documentation

## 2013-06-11 DIAGNOSIS — J4489 Other specified chronic obstructive pulmonary disease: Secondary | ICD-10-CM | POA: Insufficient documentation

## 2013-06-11 DIAGNOSIS — R9439 Abnormal result of other cardiovascular function study: Secondary | ICD-10-CM

## 2013-06-11 DIAGNOSIS — R0602 Shortness of breath: Secondary | ICD-10-CM | POA: Insufficient documentation

## 2013-06-11 DIAGNOSIS — E785 Hyperlipidemia, unspecified: Secondary | ICD-10-CM | POA: Insufficient documentation

## 2013-06-11 DIAGNOSIS — I25810 Atherosclerosis of coronary artery bypass graft(s) without angina pectoris: Secondary | ICD-10-CM

## 2013-06-11 DIAGNOSIS — I10 Essential (primary) hypertension: Secondary | ICD-10-CM | POA: Insufficient documentation

## 2013-06-11 DIAGNOSIS — R0600 Dyspnea, unspecified: Secondary | ICD-10-CM

## 2013-06-11 DIAGNOSIS — I2510 Atherosclerotic heart disease of native coronary artery without angina pectoris: Secondary | ICD-10-CM

## 2013-06-11 DIAGNOSIS — R0609 Other forms of dyspnea: Secondary | ICD-10-CM | POA: Insufficient documentation

## 2013-06-11 DIAGNOSIS — R0989 Other specified symptoms and signs involving the circulatory and respiratory systems: Secondary | ICD-10-CM | POA: Insufficient documentation

## 2013-06-11 DIAGNOSIS — R5381 Other malaise: Secondary | ICD-10-CM | POA: Insufficient documentation

## 2013-06-11 DIAGNOSIS — E119 Type 2 diabetes mellitus without complications: Secondary | ICD-10-CM | POA: Insufficient documentation

## 2013-06-11 DIAGNOSIS — I208 Other forms of angina pectoris: Secondary | ICD-10-CM

## 2013-06-11 LAB — BASIC METABOLIC PANEL
BUN: 13 mg/dL (ref 6–23)
CO2: 29 mEq/L (ref 19–32)
Chloride: 101 mEq/L (ref 96–112)
Glucose, Bld: 165 mg/dL — ABNORMAL HIGH (ref 70–99)
Potassium: 4.5 mEq/L (ref 3.5–5.1)
Sodium: 136 mEq/L (ref 135–145)

## 2013-06-11 LAB — CBC WITH DIFFERENTIAL/PLATELET
Basophils Absolute: 0 10*3/uL (ref 0.0–0.1)
Eosinophils Relative: 2.2 % (ref 0.0–5.0)
HCT: 35.1 % — ABNORMAL LOW (ref 39.0–52.0)
Hemoglobin: 11.4 g/dL — ABNORMAL LOW (ref 13.0–17.0)
Lymphs Abs: 2.3 10*3/uL (ref 0.7–4.0)
MCV: 84.2 fl (ref 78.0–100.0)
Monocytes Absolute: 0.9 10*3/uL (ref 0.1–1.0)
Monocytes Relative: 10.7 % (ref 3.0–12.0)
Neutro Abs: 4.6 10*3/uL (ref 1.4–7.7)
Platelets: 235 10*3/uL (ref 150.0–400.0)
RDW: 16.5 % — ABNORMAL HIGH (ref 11.5–14.6)
WBC: 8.1 10*3/uL (ref 4.5–10.5)

## 2013-06-11 LAB — PROTIME-INR: Prothrombin Time: 10.5 s (ref 10.2–12.4)

## 2013-06-11 MED ORDER — SODIUM CHLORIDE 0.9 % IV SOLN: INTRAVENOUS | Status: DC

## 2013-06-11 MED ORDER — TECHNETIUM TC 99M SESTAMIBI GENERIC - CARDIOLITE
33.0000 | Freq: Once | INTRAVENOUS | Status: AC | PRN
  Administered 2013-06-11: 33 via INTRAVENOUS

## 2013-06-11 MED ORDER — TECHNETIUM TC 99M SESTAMIBI GENERIC - CARDIOLITE
11.0000 | Freq: Once | INTRAVENOUS | Status: AC | PRN
  Administered 2013-06-11: 11 via INTRAVENOUS

## 2013-06-11 NOTE — Patient Instructions (Signed)
Your physician recommends that you have labs today: BMET, CBC w/Diff, PTINR  Your physician recommends that you continue on your current medications as directed. Please refer to the Current Medication list given to you today.  Your physician has requested that you have a  Left cardiac catheterization. Cardiac catheterization is used to diagnose and/or treat various heart conditions. Doctors may recommend this procedure for a number of different reasons. The most common reason is to evaluate chest pain. Chest pain can be a symptom of coronary artery disease (CAD), and cardiac catheterization can show whether plaque is narrowing or blocking your heart's arteries. This procedure is also used to evaluate the valves, as well as measure the blood flow and oxygen levels in different parts of your heart. For further information please visit [email or web address] Please follow instruction sheet, as given.

## 2013-06-11 NOTE — Progress Notes (Signed)
     [address] [place], [place]  [address] Phone: [phone] Fax:  [phone]  Date:  06/11/2013   ID:  Oscar Robinson, DOB 11/25/1945, MRN [id number]  PCP:  SLATOSKY,JOHN J., MD   History of Present Illness: Oscar Robinson is a 67 y.o. male with coronary artery disease status post bypass surgery 1999, significant tobacco use, COPD with recent increasing shortness of breath, dyspnea on exertion here at the request of Dr. Byrum for further evaluation.  Dyspnea with exertional with no chest pain, currently moderate in severity, relieved with rest with no obvious exacerbating factors, non-positional. No palpitations. Prior to CABG had symptoms of dyspnea mostly. Fatigue.   Last stress test was about 2 years ago he thinks in [place] at [facility]. Said it was OK.   Today, 06/11/13 he underwent nuclear stress test where he had marked ST segment depression diffusely during exercise, relieved with rest as well as marked shortness of breath, pale. Blood pressure also decreased during exercise.   Wt Readings from Last 3 Encounters:  06/11/13 188 lb (85.276 kg)  05/22/13 189 lb (85.73 kg)  05/01/13 193 lb 3.2 oz (87.635 kg)     Past Medical History  Diagnosis Date  . GERD (gastroesophageal reflux disease)   . Hypercholesteremia   . COPD (chronic obstructive pulmonary disease)   . Diabetes mellitus   . Tobacco abuse   . Coronary artery disease     Past Surgical History  Procedure Laterality Date  . Coronary artery bypass graft  1999  . Hand surgery      lt palm,ring finger,  . Hernia repair  1996    lt/rt ing hernia  . Dupuytren contracture release  04/10/2012    Procedure: DUPUYTREN CONTRACTURE RELEASE;  Surgeon: Robert V Sypher Jr., MD;  Location: [facility];  Service: Orthopedics;  Laterality: Right;  palm, ring and small fingers dupuytrens contracture release    Current Outpatient Prescriptions  Medication Sig Dispense Refill    . ALPRAZolam (XANAX) 0.5 MG tablet Take 0.25 mg by mouth at bedtime.      . amLODipine (NORVASC) 5 MG tablet Take 1 tablet (5 mg total) by mouth daily.  30 tablet  6  . aspirin 81 MG tablet Take 162 mg by mouth daily.       . co-enzyme Q-10 30 MG capsule Take 30 mg by mouth daily.      . colesevelam (WELCHOL) 625 MG tablet Take 1,875 mg by mouth 2 (two) times daily with a meal.      . doxepin (SINEQUAN) 50 MG capsule Take 100 mg by mouth daily.      . Fluticasone Furoate-Vilanterol (BREO ELLIPTA) 100-25 MCG/INH AEPB Inhale into the lungs every morning.      . insulin glargine (LANTUS) 100 UNIT/ML injection Inject into the skin as directed. 25 units      . insulin lispro (HUMALOG) 100 UNIT/ML injection Inject into the skin as directed.      . metFORMIN (GLUCOPHAGE) 500 MG tablet Take 500 mg by mouth daily with breakfast.       . metoprolol succinate (TOPROL-XL) 50 MG 24 hr tablet Take 50 mg by mouth daily. Take with or immediately following a meal.      . Multiple Vitamin (MULTIVITAMIN) capsule Take 1 capsule by mouth daily.      . nitroGLYCERIN (NITROSTAT) 0.4 MG SL tablet Place 1 tablet (0.4 mg total) under the tongue every 5 (  five) minutes as needed for chest pain.  30 tablet  5  . Omega-3 Fatty Acids (FISH OIL) 1200 MG CAPS Take 1 capsule by mouth 2 (two) times daily.      . pravastatin (PRAVACHOL) 40 MG tablet Take 40 mg by mouth daily.      . ramipril (ALTACE) 10 MG capsule Take 10 mg by mouth daily.      . tiotropium (SPIRIVA HANDIHALER) 18 MCG inhalation capsule Place 1 capsule (18 mcg total) into inhaler and inhale daily.  30 capsule  5   No current facility-administered medications for this visit.    Allergies:   No Known Allergies  Social History:  The patient  reports that he quit smoking about 15 years ago. His smoking use included Cigarettes. He has a 70 pack-year smoking history. He has never used smokeless tobacco. He reports that he drinks alcohol.   Family History   Problem Relation Age of Onset  . Drug abuse Brother     Cocaine  . Heart disease Neg Hx   . Heart failure Neg Hx   . Diabetes Neg Hx   . Hypertension Neg Hx     ROS:  Please see the history of present illness.   Denies any bleeding, syncope, stroke like symptoms, dysphagia, orthopnea, PND. No rashes or   All other systems reviewed and negative.   PHYSICAL EXAM: VS:  BP 145/96  Pulse 79  Ht 5' 9" (1.753 m) Well nourished, well developed, in no acute distress HEENT: normal, [place]/AT, EOMI Neck: no JVD, normal carotid upstroke, no bruit Cardiac:  normal S1, S2; RRR; no murmur Lungs:  clear to auscultation bilaterally, no wheezing, rhonchi or rales although he does have prolonged expiratory phase/distant breath sounds noted Abd: soft, nontender, no hepatomegaly, no bruits Ext: no edema, 2+ distal pulses Skin: warm and dry GU: deferred Neuro: no focal abnormalities noted, AAO x 3  EKG:    sinus rhythm, 73 with nonspecific ST-T wave changes   CATH: ?2002 - May have had collateral flow. No PCI could not find records in Epic.  ASSESSMENT AND PLAN:  1. Angina/shortness of breath-dyspnea on exertion was a previous anginal equivalent for him prior to his bypass surgery in 1999.Nuclear stress test today, exercise treadmill portion, was concerning for multivessel disease with ST segment depression diffusely, blood pressure decrease and market shortness of breath. I will be proceeding with heart catheterization. I discussed with him risks, benefits including stroke, heart attack, death, renal impairment, bleeding. I would like to proceed with femoral artery approach since bypass graft anatomy is currently unknown. Continue with current aggressive secondary prevention. 2. COPD-Dr. Byrum. Medications reviewed 3. Hypertension-stable. 4. Angina-nitroglycerin refilled. 5. Coronary artery disease-as described above-prior bypass 1999 6. Diabetes-insulin-dependent, primary physician.  Signed, Mark  Skains, MD FACC  06/11/2013 3:00 PM     

## 2013-06-11 NOTE — Progress Notes (Signed)
[facility] SITE 3 NUCLEAR MED [address] [place], [place] [address] [phone]    Cardiology Nuclear Med Study  Oscar Robinson is a 67 y.o. male     MRN : [id number]     DOB: May 23, 1946  Procedure Date: 06/11/2013  Nuclear Med Background Indication for Stress Test:  Evaluation for Ischemia and Graft Patency History:  COPD and '99 CABG, 3/4 yrs ago MPI: [place] Cardiac Risk Factors: History of Smoking, Hypertension, IDDM Type 2 and Lipids  Symptoms:  DOE, Fatigue and SOB   Nuclear Pre-Procedure Caffeine/Decaff Intake:  None NPO After: 8:00pm   Lungs:  clear O2 Sat: 99% on room air. IV 0.9% NS with Angio Cath:  22g  IV Site: R Hand  IV Started by:  Bonnita Levan, RN  Chest Size (in):  44 Cup Size: n/a  Height: 5\' 9"  (1.753 m)  Weight:  188 lb (85.276 kg)  BMI:  Body mass index is 27.75 kg/(m^2). Tech Comments:  Check pic    Nuclear Med Study 1 or 2 day study: 1 day  Stress Test Type:  Stress  Reading MD: Donato Schultz, MD  Order Authorizing Provider:  Donato Schultz, MD  Resting Radionuclide: Technetium 73m Sestamibi  Resting Radionuclide Dose: 12 mCi   Stress Radionuclide:  Technetium 79m Sestamibi  Stress Radionuclide Dose: 32 mCi           Stress Protocol Rest HR: 70 Stress HR: 131  Rest BP: 141/82 Stress BP: 161/68  Exercise Time (min): 8:00 METS: 10.10   Predicted Max HR: 154 bpm % Max HR: 85.06 bpm Rate Pressure Product: 10272   Dose of Adenosine (mg):  n/a Dose of Lexiscan: n/a mg  Dose of Atropine (mg): n/a Dose of Dobutamine: n/a mcg/kg/min (at max HR)  Stress Test Technologist: Milana Na, EMT-P  Nuclear Technologist:  Domenic Polite, CNMT     Rest Procedure:  Myocardial perfusion imaging was performed at rest 45 minutes following the intravenous administration of Technetium 82m Sestamibi. Rest ECG: NSR - Normal EKG  Stress Procedure:  The patient exercised on the treadmill utilizing the Bruce Protocol for 8:00 minutes. The patient  stopped due to fatigue, extreme sob, and denied any chest pain.  Technetium 1m Sestamibi was injected at peak exercise and myocardial perfusion imaging was performed after a brief delay. Stress ECG: Significant ST abnormalities consistent with ischemia.  QPS Raw Data Images:  Normal; no motion artifact; normal heart/lung ratio. Stress Images:  There is decreased basal septal uptake at both rest and stress. No ischemia.  Rest Images:  There is decreased basal septal uptake at both rest and stress. No ischemia. Subtraction (SDS):  Possible basal septal scar. No ischemia.  Transient Ischemic Dilatation (Normal <1.22):  0.95 Normal Lung/Heart Ratio (Normal <0.45):  0.31 Normal  Quantitative Gated Spect Images QGS EDV:  85 ml QGS ESV:  37 ml  Impression Exercise Capacity:  Fair exercise capacity. BP Response:  Hypotensive blood pressure response. Clinical Symptoms:  There is dyspnea. ECG Impression:  Significant ST abnormalities consistent with ischemia. Comparison with Prior Nuclear Study: No images to compare  Overall Impression:  High risk stress nuclear study with BP decrease during exercise, ST depression diffusely as well as significant dyspnea on exertion. .  LV Ejection Fraction: 57.  LV Wall Motion:  Septal wall post op changes (Prior CABG)   Discussed in clinic. Will proceed with cath. See note for details.

## 2013-06-12 ENCOUNTER — Encounter (HOSPITAL_COMMUNITY): Payer: Self-pay | Admitting: Pharmacy Technician

## 2013-06-15 ENCOUNTER — Encounter (HOSPITAL_COMMUNITY)
Admission: RE | Disposition: A | Discharge status: Home / Self Care | Payer: Self-pay | Source: Ambulatory Visit | Attending: Cardiology

## 2013-06-15 ENCOUNTER — Other Ambulatory Visit: Payer: Self-pay | Admitting: Cardiology

## 2013-06-15 ENCOUNTER — Ambulatory Visit (HOSPITAL_COMMUNITY)
Admission: RE | Admit: 2013-06-15 | Discharge: 2013-06-15 | Disposition: A | Discharge status: Home / Self Care | Payer: Medicare Other | Source: Ambulatory Visit | Attending: Cardiology | Admitting: Cardiology

## 2013-06-15 DIAGNOSIS — J449 Chronic obstructive pulmonary disease, unspecified: Secondary | ICD-10-CM

## 2013-06-15 DIAGNOSIS — K219 Gastro-esophageal reflux disease without esophagitis: Secondary | ICD-10-CM | POA: Insufficient documentation

## 2013-06-15 DIAGNOSIS — F17200 Nicotine dependence, unspecified, uncomplicated: Secondary | ICD-10-CM | POA: Insufficient documentation

## 2013-06-15 DIAGNOSIS — Z951 Presence of aortocoronary bypass graft: Secondary | ICD-10-CM | POA: Insufficient documentation

## 2013-06-15 DIAGNOSIS — I2510 Atherosclerotic heart disease of native coronary artery without angina pectoris: Secondary | ICD-10-CM

## 2013-06-15 DIAGNOSIS — I10 Essential (primary) hypertension: Secondary | ICD-10-CM | POA: Insufficient documentation

## 2013-06-15 DIAGNOSIS — R0600 Dyspnea, unspecified: Secondary | ICD-10-CM

## 2013-06-15 DIAGNOSIS — R9439 Abnormal result of other cardiovascular function study: Secondary | ICD-10-CM

## 2013-06-15 DIAGNOSIS — I25709 Atherosclerosis of coronary artery bypass graft(s), unspecified, with unspecified angina pectoris: Secondary | ICD-10-CM | POA: Diagnosis present

## 2013-06-15 DIAGNOSIS — R0602 Shortness of breath: Secondary | ICD-10-CM | POA: Insufficient documentation

## 2013-06-15 DIAGNOSIS — I208 Other forms of angina pectoris: Secondary | ICD-10-CM | POA: Diagnosis present

## 2013-06-15 DIAGNOSIS — E7800 Pure hypercholesterolemia, unspecified: Secondary | ICD-10-CM | POA: Insufficient documentation

## 2013-06-15 DIAGNOSIS — I209 Angina pectoris, unspecified: Secondary | ICD-10-CM | POA: Insufficient documentation

## 2013-06-15 DIAGNOSIS — J4489 Other specified chronic obstructive pulmonary disease: Secondary | ICD-10-CM

## 2013-06-15 DIAGNOSIS — Z794 Long term (current) use of insulin: Secondary | ICD-10-CM | POA: Insufficient documentation

## 2013-06-15 DIAGNOSIS — E119 Type 2 diabetes mellitus without complications: Secondary | ICD-10-CM | POA: Insufficient documentation

## 2013-06-15 HISTORY — PX: LEFT HEART CATHETERIZATION WITH CORONARY/GRAFT ANGIOGRAM: SHX5450

## 2013-06-15 SURGERY — LEFT HEART CATHETERIZATION WITH CORONARY/GRAFT ANGIOGRAM
Anesthesia: LOCAL

## 2013-06-15 MED ORDER — SODIUM CHLORIDE 0.9 % IJ SOLN: 3.0000 mL | Freq: Two times a day (BID) | INTRAMUSCULAR | Status: DC

## 2013-06-15 MED ORDER — DIAZEPAM 5 MG PO TABS
5.0000 mg | ORAL_TABLET | ORAL | Status: AC
  Administered 2013-06-15: 5 mg via ORAL
  Filled 2013-06-15: qty 1

## 2013-06-15 MED ORDER — LIDOCAINE HCL (PF) 1 % IJ SOLN
INTRAMUSCULAR | Status: AC
  Filled 2013-06-15: qty 30

## 2013-06-15 MED ORDER — NITROGLYCERIN 0.2 MG/ML ON CALL CATH LAB
INTRAVENOUS | Status: AC
  Filled 2013-06-15: qty 1

## 2013-06-15 MED ORDER — SODIUM CHLORIDE 0.9 % IV SOLN
INTRAVENOUS | Status: DC
  Administered 2013-06-15: 08:00:00 via INTRAVENOUS

## 2013-06-15 MED ORDER — SODIUM CHLORIDE 0.9 % IV SOLN: 250.0000 mL | INTRAVENOUS | Status: DC | PRN

## 2013-06-15 MED ORDER — HEPARIN (PORCINE) IN NACL 2-0.9 UNIT/ML-% IJ SOLN
INTRAMUSCULAR | Status: AC
  Filled 2013-06-15: qty 1000

## 2013-06-15 MED ORDER — ASPIRIN 81 MG PO CHEW
81.0000 mg | CHEWABLE_TABLET | ORAL | Status: AC
  Administered 2013-06-15: 81 mg via ORAL

## 2013-06-15 MED ORDER — MIDAZOLAM HCL 2 MG/2ML IJ SOLN
INTRAMUSCULAR | Status: AC
  Filled 2013-06-15: qty 2

## 2013-06-15 MED ORDER — FENTANYL CITRATE 0.05 MG/ML IJ SOLN
INTRAMUSCULAR | Status: AC
  Filled 2013-06-15: qty 2

## 2013-06-15 MED ORDER — SODIUM CHLORIDE 0.9 % IJ SOLN: 3.0000 mL | INTRAMUSCULAR | Status: DC | PRN

## 2013-06-15 MED ORDER — ONDANSETRON HCL 4 MG/2ML IJ SOLN: 4.0000 mg | Freq: Four times a day (QID) | INTRAMUSCULAR | Status: DC | PRN

## 2013-06-15 MED ORDER — SODIUM CHLORIDE 0.9 % IV SOLN: 1.0000 mL/kg/h | INTRAVENOUS | Status: DC

## 2013-06-15 MED ORDER — ACETAMINOPHEN 325 MG PO TABS: 650.0000 mg | ORAL_TABLET | ORAL | Status: DC | PRN

## 2013-06-15 MED ORDER — ASPIRIN 81 MG PO CHEW
CHEWABLE_TABLET | ORAL | Status: AC
  Administered 2013-06-15: 81 mg via ORAL
  Filled 2013-06-15: qty 1

## 2013-06-15 NOTE — H&P (View-Only) (Signed)
[address] [place], [place]  [address] Phone: [phone] Fax:  [phone]  Date:  06/11/2013   ID:  Oscar Robinson, DOB 09/23/1945, MRN [id number]  PCP:  Nonnie Done., MD   History of Present Illness: Oscar Robinson is a 67 y.o. male with coronary artery disease status post bypass surgery 1999, significant tobacco use, COPD with recent increasing shortness of breath, dyspnea on exertion here at the request of Dr. Delton Coombes for further evaluation.  Dyspnea with exertional with no chest pain, currently moderate in severity, relieved with rest with no obvious exacerbating factors, non-positional. No palpitations. Prior to CABG had symptoms of dyspnea mostly. Fatigue.   Last stress test was about 2 years ago he thinks in [place] at [facility]. [place] Spaniel it was OK.   Today, 06/11/13 he underwent nuclear stress test where he had marked ST segment depression diffusely during exercise, relieved with rest as well as marked shortness of breath, pale. Blood pressure also decreased during exercise.   Wt Readings from Last 3 Encounters:  06/11/13 188 lb (85.276 kg)  05/22/13 189 lb (85.73 kg)  05/01/13 193 lb 3.2 oz (87.635 kg)     Past Medical History  Diagnosis Date  . GERD (gastroesophageal reflux disease)   . Hypercholesteremia   . COPD (chronic obstructive pulmonary disease)   . Diabetes mellitus   . Tobacco abuse   . Coronary artery disease     Past Surgical History  Procedure Laterality Date  . Coronary artery bypass graft  1999  . Hand surgery      lt palm,ring finger,  . Hernia repair  1996    lt/rt ing hernia  . Dupuytren contracture release  04/10/2012    Procedure: DUPUYTREN CONTRACTURE RELEASE;  Surgeon: Wyn Forster., MD;  Location: [facility];  Service: Orthopedics;  Laterality: Right;  palm, ring and small fingers dupuytrens contracture release    Current Outpatient Prescriptions  Medication Sig Dispense Refill    . ALPRAZolam (XANAX) 0.5 MG tablet Take 0.25 mg by mouth at bedtime.      Marland Kitchen amLODipine (NORVASC) 5 MG tablet Take 1 tablet (5 mg total) by mouth daily.  30 tablet  6  . aspirin 81 MG tablet Take 162 mg by mouth daily.       Marland Kitchen co-enzyme Q-10 30 MG capsule Take 30 mg by mouth daily.      . colesevelam (WELCHOL) 625 MG tablet Take 1,875 mg by mouth 2 (two) times daily with a meal.      . doxepin (SINEQUAN) 50 MG capsule Take 100 mg by mouth daily.      . Fluticasone Furoate-Vilanterol (BREO ELLIPTA) 100-25 MCG/INH AEPB Inhale into the lungs every morning.      . insulin glargine (LANTUS) 100 UNIT/ML injection Inject into the skin as directed. 25 units      . insulin lispro (HUMALOG) 100 UNIT/ML injection Inject into the skin as directed.      . metFORMIN (GLUCOPHAGE) 500 MG tablet Take 500 mg by mouth daily with breakfast.       . metoprolol succinate (TOPROL-XL) 50 MG 24 hr tablet Take 50 mg by mouth daily. Take with or immediately following a meal.      . Multiple Vitamin (MULTIVITAMIN) capsule Take 1 capsule by mouth daily.      . nitroGLYCERIN (NITROSTAT) 0.4 MG SL tablet Place 1 tablet (0.4 mg total) under the tongue every 5 (  five) minutes as needed for chest pain.  30 tablet  5  . Omega-3 Fatty Acids (FISH OIL) 1200 MG CAPS Take 1 capsule by mouth 2 (two) times daily.      . pravastatin (PRAVACHOL) 40 MG tablet Take 40 mg by mouth daily.      . ramipril (ALTACE) 10 MG capsule Take 10 mg by mouth daily.      Marland Kitchen tiotropium (SPIRIVA HANDIHALER) 18 MCG inhalation capsule Place 1 capsule (18 mcg total) into inhaler and inhale daily.  30 capsule  5   No current facility-administered medications for this visit.    Allergies:   No Known Allergies  Social History:  The patient  reports that he quit smoking about 15 years ago. His smoking use included Cigarettes. He has a 70 pack-year smoking history. He has never used smokeless tobacco. He reports that he drinks alcohol.   Family History   Problem Relation Age of Onset  . Drug abuse Brother     Cocaine  . Heart disease Neg Hx   . Heart failure Neg Hx   . Diabetes Neg Hx   . Hypertension Neg Hx     ROS:  Please see the history of present illness.   Denies any bleeding, syncope, stroke like symptoms, dysphagia, orthopnea, PND. No rashes or   All other systems reviewed and negative.   PHYSICAL EXAM: VS:  BP 145/96  Pulse 79  Ht 5\' 9"  (1.753 m) Well nourished, well developed, in no acute distress HEENT: normal, [place]/AT, EOMI Neck: no JVD, normal carotid upstroke, no bruit Cardiac:  normal S1, S2; RRR; no murmur Lungs:  clear to auscultation bilaterally, no wheezing, rhonchi or rales although he does have prolonged expiratory phase/distant breath sounds noted Abd: soft, nontender, no hepatomegaly, no bruits Ext: no edema, 2+ distal pulses Skin: warm and dry GU: deferred Neuro: no focal abnormalities noted, AAO x 3  EKG:    sinus rhythm, 73 with nonspecific ST-T wave changes   CATH: ?2002 - May have had collateral flow. No PCI could not find records in Epic.  ASSESSMENT AND PLAN:  1. Angina/shortness of breath-dyspnea on exertion was a previous anginal equivalent for him prior to his bypass surgery in 1999.Nuclear stress test today, exercise treadmill portion, was concerning for multivessel disease with ST segment depression diffusely, blood pressure decrease and market shortness of breath. I will be proceeding with heart catheterization. I discussed with him risks, benefits including stroke, heart attack, death, renal impairment, bleeding. I would like to proceed with femoral artery approach since bypass graft anatomy is currently unknown. Continue with current aggressive secondary prevention. 2. COPD-Dr. Delton Coombes. Medications reviewed 3. Hypertension-stable. 4. Angina-nitroglycerin refilled. 5. Coronary artery disease-as described above-prior bypass 1999 6. Diabetes-insulin-dependent, primary physician.  Signed, Donato Schultz, MD [facility]  06/11/2013 3:00 PM

## 2013-06-15 NOTE — Interval H&P Note (Signed)
History and Physical Interval Note:  06/15/2013 8:57 AM  Oscar Robinson  has presented today for surgery, with the diagnosis of Chest pain  The various methods of treatment have been discussed with the patient and family. After consideration of risks, benefits and other options for treatment, the patient has consented to  Procedure(s): LEFT HEART CATHETERIZATION WITH CORONARY/GRAFT ANGIOGRAM (N/A) as a surgical intervention .  The patient's history has been reviewed, patient examined, no change in status, stable for surgery.  I have reviewed the patient's chart and labs.  Questions were answered to the patient's satisfaction.     SKAINS, MARK

## 2013-06-15 NOTE — Interval H&P Note (Signed)
Cath Lab Visit (complete for each Cath Lab visit)  Clinical Evaluation Leading to the Procedure:   ACS: no  Non-ACS:    Anginal Classification: CCS III  Anti-ischemic medical therapy: Maximal Therapy (2 or more classes of medications)  Non-Invasive Test Results: Intermediate-risk stress test findings: cardiac mortality 1-3%/year (Perfusion normal, but ST depression diffuse with decreased BP during exercise, dyspnea.   Prior CABG: Previous CABG      History and Physical Interval Note:  06/15/2013 9:55 AM  Oscar Robinson  has presented today for surgery, with the diagnosis of Chest pain  The various methods of treatment have been discussed with the patient and family. After consideration of risks, benefits and other options for treatment, the patient has consented to  Procedure(s): LEFT HEART CATHETERIZATION WITH CORONARY/GRAFT ANGIOGRAM (N/A) as a surgical intervention .  The patient's history has been reviewed, patient examined, no change in status, stable for surgery.  I have reviewed the patient's chart and labs.  Questions were answered to the patient's satisfaction.     Arek Spadafore

## 2013-06-15 NOTE — CV Procedure (Signed)
    CARDIAC CATHETERIZATION  PROCEDURE:  Left heart catheterization with selective coronary angiography, left ventriculogram. Selective bypass angiography, LIMA.  INDICATIONS:  67 year old male with coronary artery disease status post. 1999, LIMA to LAD/diagonal sequential with previous high-grade LAD lesion/diagonal lesion who has been experiencing increasing shortness of breath with activity. Has COPD followed by Dr. Delton Coombes. He underwent nuclear stress test which showed no perfusion abnormalities however he had diffuse ST segment depression as well as decreased blood pressure during exercise and significant shortness of breath that was worrisome for global ischemia. He was taken to the cardiac catheterization lab to further evaluate.  The risks, benefits, and details of the procedure were explained to the patient.  The patient verbalized understanding and wanted to proceed.  Informed written consent was obtained.  PROCEDURE TECHNIQUE:  After Xylocaine anesthesia and visualization of the femoral head via fluoroscopy, a 14F sheath was placed in the right femoral artery with a single anterior needle wall stick.   Left coronary angiography was done using a Judkins L4 catheter.  Right coronary angiography was done using a Judkins R4 catheter. This catheter was also used to subselect the LIMA artery after wire guidance into the subclavian. There were no other bypass is present. Left ventriculography was done using a pigtail catheter.    CONTRAST:  Total of 85 ml.  FLOUROSCOPY TIME: 2.1 minutes.   COMPLICATIONS:  None.    HEMODYNAMICS:  Aortic pressure was 100/22mmHg; LV systolic pressure was ; LVEDP .  There was no gradient between the left ventricle and aorta.    ANGIOGRAPHIC DATA:    Left main: Patent left main branching into the LAD as well as circumflex artery. No angiographically significant disease.  Left anterior descending (LAD): There is a lesion at the first diagonal  bifurcation. First diagonal has approximately 80-90% ostial stenosis. The LAD surrounding this diagonal lesion in some views appears only mild in severity. In the RAO caudal view however the LAD lesion surrounding this diagonal branch appears to have stenosis of approximately 70-80%. LIMA to LAD is a fairly small caliber vessel. Competitive flow is noted. Once again, there does not appear to be a perfusion defect along the anteroseptal wall on nuclear stress test indicative of normal flow through this region.  Circumflex artery (CIRC): Widely patent vessel giving rise to 3 obtuse marginal branches. No angiographically significant disease.  Right coronary artery (RCA): Widely patent vessel, dominant. No angiographically significant sees.  LEFT VENTRICULOGRAM:  Left ventricular angiogram was done in the 30 RAO projection and revealed normal left ventricular wall motion and systolic function with an estimated ejection fraction of 60%.   IMPRESSIONS:  1. Native vessel LAD/diagonal bifurcation disease with patent LIMA to LAD/diagonal albeit small caliber vessel. Normal perfusion pattern on nuclear stress. (Concern was ST segment depression as well as decrease in blood pressure during exercise, dyspnea)  2. Normal left ventricular systolic function.  LVEDP 10 mmHg.  Ejection fraction 60%.  RECOMMENDATION:  Continue with medical management. Promoting exercise, antianginal medications continue with metoprolol, amlodipine. Pravastatin. With lack of perfusion abnormality, I would not promote PCI of native LAD or angioplasty of diagonal branch at this juncture.

## 2013-06-15 NOTE — Progress Notes (Signed)
Called the patient left message to have patient call the office. Trying to get clarification as to wether the patient had the Cath. with [facility].

## 2013-06-16 ENCOUNTER — Ambulatory Visit (INDEPENDENT_AMBULATORY_CARE_PROVIDER_SITE_OTHER): Payer: Medicare Other | Admitting: Emergency Medicine

## 2013-06-16 ENCOUNTER — Encounter: Payer: Self-pay | Admitting: Emergency Medicine

## 2013-06-16 ENCOUNTER — Ambulatory Visit: Payer: Medicare Other | Admitting: Emergency Medicine

## 2013-06-16 ENCOUNTER — Telehealth: Payer: Self-pay | Admitting: Cardiology

## 2013-06-16 VITALS — BP 140/80 | HR 110 | Ht 69.0 in | Wt 189.8 lb

## 2013-06-16 DIAGNOSIS — I2510 Atherosclerotic heart disease of native coronary artery without angina pectoris: Secondary | ICD-10-CM

## 2013-06-16 DIAGNOSIS — J449 Chronic obstructive pulmonary disease, unspecified: Secondary | ICD-10-CM

## 2013-06-16 LAB — GLUCOSE, CAPILLARY: Glucose-Capillary: 83 mg/dL (ref 70–99)

## 2013-06-16 NOTE — Assessment & Plan Note (Signed)
-   appreciate Dr Anne Fu help

## 2013-06-16 NOTE — Progress Notes (Signed)
Note- Patient received Sestamibi 11 mCi for the Rest imaging.  Leonia Corona, RT-N

## 2013-06-16 NOTE — Patient Instructions (Signed)
Please continue your Spiriva and Symbicort Follow with Dr Delton Coombes in 4 months or sooner if you have any problems.

## 2013-06-16 NOTE — Telephone Encounter (Signed)
New Problem  Pt is returning call.

## 2013-06-16 NOTE — Progress Notes (Signed)
Oscar Robinson is a 67 year old gentleman with known coronary artery disease status post CABG. Also has a history of significant tobacco abuse and airflow limitation on pulmonary function testing. He has a positive bronchodilator response.   ROV 05/23/10 -- follows up today for his COPD, dyspnea. He is doing an exercise program. 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 No wheeze or cough during the day. He does cough at night when he lays flat. No real wt gain. No CP. Occas ankle swelling esp when he eats salt.   ROV 09/17/11 -- follows up today for his COPD, dyspnea. Last seen 05/2010. Has been doing fairly well, was exposed to URI about 3 weeks ago, sore throat, congestion that progressed to dyspnea. Was rx with azithro and has improved, cough and breathing both better. Did not receive steroids. Feels back to baseline. He is not very reliable with symbicort because it sometimes gives him sore throat. Tells me also that sometimes he skips the Spiriva and takes the Symbicort (seems to help his breathing more).    ROV 03/18/12 --  COPD, dyspnea.  He has been doing fairly well, is having congestion at night, doesn't wake him from sleep. Continues to take Symbicort and Spiriva, sometimes skips the pm dose Symbicort. Uses SABA very rarely. He snores and has had witnessed apneas by his wife.   ROV 09/16/12 -- COPD, dyspnea, BD responsiveness. Presents today noting that he developed cough, congestion, malaise about 10 days ago. PCP did flu swab > negative. Was started on pred taper a week ago - seemed to help him, then was exposed to person with URI over the last weekend. He is now having more cough again, chest pain from the cough. The phlegm is yellow to pink, no frank blood.    ROV 11/17/12 -- COPD, dyspnea, BD responsiveness.  F/u after URI's as above, AE-COPD. We extended his pred taper and treated w doxy >>  feels much better, probably back to baseline. He is on spiriva and symbicort. 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 He is coughing rarely, has some mucous production esp when he is supine at night.   ROV 05/01/13 -- COPD, dyspnea, BD responsiveness. He has a hx CAD/CABG in '99. Returns for f/u. He is noticing more exertional SOB over the last several months. No new cough, wheeze or adjunct symptoms. Denies CP or angina. 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 He has gained 5-10 lbs. Spiriva + symbicort are his maintenance. He has a cardiologist in [place], but not sure he want to follow there.   ROV 06/16/13 -- COPD, dyspnea, BD responsiveness. He has a hx CAD/CABG in '99.  He met and liked Dr Anne Fu, has undergone nuclear stress and now L cath. Trial of breo seems to have been beneficial, but he isn't sure it's a lot better than symbicort.    EXAM:  Filed Vitals:   06/16/13 1128  BP: 140/80  Pulse: 110  Height: 5\' 9"  (1.753 m)  Weight: 189 lb 12.8 oz (86.093 kg)  SpO2: 97%   Gen: Pleasant, well-nourished, in no distress,  normal affect  ENT: No lesions,  mouth clear,  oropharynx clear, no postnasal drip  Neck: No JVD, no TMG, no carotid bruits  Lungs: No use of accessory muscles, some end-exp wheeze B  Cardiovascular: RRR, heart sounds normal, no murmur or gallops, no  peripheral edema  Musculoskeletal: No deformities, no cyanosis or clubbing  Neuro: alert, non focal  Skin: Warm, no lesions or rashes  CHRONIC OBSTRUCTIVE PULMONARY DISEASE - continue symbicort and spiriva - saba prn - rov 4   CORONARY ARTERY DISEASE - appreciate Dr Anne Fu help

## 2013-06-16 NOTE — Assessment & Plan Note (Signed)
-   continue symbicort and spiriva - saba prn - rov 4

## 2013-06-18 NOTE — Telephone Encounter (Signed)
New Problem  Pt called state after the cath he has swelling and bruising near the wound please assist

## 2013-06-18 NOTE — Telephone Encounter (Signed)
Pt calls today b/c he worked out for 20 minutes yesterday on the [place] then experienced swelling at the cath site. He also noted a small bruise at the site. No drainage noted, no fever. He states the swelling is back down today.  Recommended pt exercise less strenuously for the next few days & I would forward this to Dr. Anne Fu for review. Mylo Red RN

## 2013-06-18 NOTE — Telephone Encounter (Signed)
I spoke with pt & he will follow Dr. Anne Fu recommendations Mylo Red RN

## 2013-06-18 NOTE — Telephone Encounter (Signed)
Agree. Easy walking only for the next 3 days. Let us know if worsens.

## 2013-06-18 NOTE — Telephone Encounter (Signed)
New Problem  °Pt called state after the cath he has swelling and bruising near the wound please assist  ° °

## 2013-07-10 ENCOUNTER — Encounter: Payer: Self-pay | Admitting: Cardiology

## 2013-07-10 ENCOUNTER — Ambulatory Visit (INDEPENDENT_AMBULATORY_CARE_PROVIDER_SITE_OTHER): Payer: Medicare Other | Admitting: Cardiology

## 2013-07-10 VITALS — BP 145/91 | HR 86 | Ht 69.5 in | Wt 190.4 lb

## 2013-07-10 DIAGNOSIS — R0600 Dyspnea, unspecified: Secondary | ICD-10-CM

## 2013-07-10 DIAGNOSIS — R0609 Other forms of dyspnea: Secondary | ICD-10-CM

## 2013-07-10 DIAGNOSIS — I2510 Atherosclerotic heart disease of native coronary artery without angina pectoris: Secondary | ICD-10-CM

## 2013-07-10 LAB — BASIC METABOLIC PANEL
BUN: 16 mg/dL (ref 6–23)
CO2: 28 mEq/L (ref 19–32)
Chloride: 102 mEq/L (ref 96–112)
Glucose, Bld: 123 mg/dL — ABNORMAL HIGH (ref 70–99)
Potassium: 4.6 mEq/L (ref 3.5–5.1)
Sodium: 136 mEq/L (ref 135–145)

## 2013-07-10 LAB — HEPATIC FUNCTION PANEL
ALT: 21 U/L (ref 0–53)
AST: 26 U/L (ref 0–37)
Bilirubin, Direct: 0.1 mg/dL (ref 0.0–0.3)
Total Bilirubin: 0.4 mg/dL (ref 0.3–1.2)
Total Protein: 6.9 g/dL (ref 6.0–8.3)

## 2013-07-10 LAB — LIPID PANEL
Cholesterol: 124 mg/dL (ref 0–200)
LDL Cholesterol: 59 mg/dL (ref 0–99)

## 2013-07-10 NOTE — Progress Notes (Signed)
[address] [place], [place]  [address] Phone: [phone] Fax:  [phone]  Date:  07/10/2013   ID:  Oscar Robinson, DOB March 02, 1946, MRN [id number]  PCP:  Nonnie Done., MD   History of Present Illness: Oscar Robinson is a 67 y.o. male with coronary artery disease status post bypass surgery 1999, significant tobacco use, COPD with recent increasing shortness of breath, dyspnea on exertion here at the request of Dr. Delton Coombes for further evaluation.  Dyspnea with exertional with no chest pain, currently moderate in severity, relieved with rest with no obvious exacerbating factors, non-positional. No palpitations. Prior to CABG had symptoms of dyspnea mostly. Fatigue.   Last stress test was about 2 years ago he thinks in [place] at [facility]. York Spaniel it was OK.   06/11/13 he underwent nuclear stress test where he had marked ST segment depression diffusely during exercise, relieved with rest as well as marked shortness of breath, pale. Blood pressure also decreased during exercise.  07/10/13 -native vessel LAD/diagonal bifurcation disease with patent LIMA to LAD/diagonal albeit small caliber vessel. Normal perfusion pattern on nuclear stress test. My concern previously was ST segment depression as well as decrease in blood pressure during exercise. This is described above. Ejection fraction 60%. We decided to continue with medical management. Metoprolol, amlodipine, pravastatin. No PCI.   Wt Readings from Last 3 Encounters:  07/10/13 190 lb 6.4 oz (86.365 kg)  06/16/13 189 lb 12.8 oz (86.093 kg)  06/15/13 188 lb (85.276 kg)     Past Medical History  Diagnosis Date  . GERD (gastroesophageal reflux disease)   . Hypercholesteremia   . COPD (chronic obstructive pulmonary disease)   . Diabetes mellitus   . Tobacco abuse   . Coronary artery disease     Past Surgical History  Procedure Laterality Date  . Coronary artery bypass graft  1999  . Hand  surgery      lt palm,ring finger,  . Hernia repair  1996    lt/rt ing hernia  . Dupuytren contracture release  04/10/2012    Procedure: DUPUYTREN CONTRACTURE RELEASE;  Surgeon: Wyn Forster., MD;  Location: [facility];  Service: Orthopedics;  Laterality: Right;  palm, ring and small fingers dupuytrens contracture release    Current Outpatient Prescriptions  Medication Sig Dispense Refill  . ALPRAZolam (XANAX) 0.5 MG tablet Take 0.25 mg by mouth at bedtime.      Marland Kitchen amLODipine (NORVASC) 5 MG tablet Take 1 tablet (5 mg total) by mouth daily.  30 tablet  6  . aspirin 81 MG tablet Take 162 mg by mouth daily.       . Coenzyme Q10 (CO Q 10) 100 MG CAPS Take 100 mg by mouth daily.      . colesevelam (WELCHOL) 625 MG tablet Take 1,875 mg by mouth 2 (two) times daily with a meal.      . doxepin (SINEQUAN) 50 MG capsule Take 100 mg by mouth daily.      . Esomeprazole Magnesium (NEXIUM PO) Take 22.3 mg by mouth daily. OTC      . insulin glargine (LANTUS) 100 UNIT/ML injection Inject 25 Units into the skin daily.       . insulin lispro (HUMALOG KWIKPEN) 100 UNIT/ML SOPN Inject 8-15 Units into the skin 3 (three) times daily.      . metFORMIN (GLUCOPHAGE) 500 MG tablet Take 500 mg by mouth 2 (two) times daily.       Marland Kitchen  metoprolol succinate (TOPROL-XL) 50 MG 24 hr tablet Take 50 mg by mouth daily. Take with or immediately following a meal.      . Multiple Vitamin (MULTIVITAMIN) capsule Take 1 capsule by mouth daily.      . nitroGLYCERIN (NITROSTAT) 0.4 MG SL tablet Place 1 tablet (0.4 mg total) under the tongue every 5 (five) minutes as needed for chest pain.  30 tablet  5  . Omega-3 Fatty Acids (FISH OIL) 1200 MG CAPS Take 1 capsule by mouth 2 (two) times daily.      . pravastatin (PRAVACHOL) 40 MG tablet Take 40 mg by mouth daily.      . ramipril (ALTACE) 10 MG capsule Take 10 mg by mouth daily.      Marland Kitchen tiotropium (SPIRIVA HANDIHALER) 18 MCG inhalation capsule Place 1 capsule (18 mcg  total) into inhaler and inhale daily.  30 capsule  5   Current Facility-Administered Medications  Medication Dose Route Frequency Provider Last Rate Last Dose  . 0.9 %  sodium chloride infusion   Intravenous Continuous Donato Schultz, MD        Allergies:   No Known Allergies  Social History:  The patient  reports that he quit smoking about 15 years ago. His smoking use included Cigarettes. He has a 70 pack-year smoking history. He has never used smokeless tobacco. He reports that he drinks alcohol.   Family History  Problem Relation Age of Onset  . Drug abuse Brother     Cocaine  . Heart disease Neg Hx   . Heart failure Neg Hx   . Diabetes Neg Hx   . Hypertension Neg Hx     ROS:  Please see the history of present illness.   Denies any bleeding, syncope, stroke like symptoms, dysphagia, orthopnea, PND. No rashes or   All other systems reviewed and negative.   PHYSICAL EXAM: VS:  BP 145/91  Pulse 86  Ht 5' 9.5" (1.765 m)  Wt 190 lb 6.4 oz (86.365 kg)  BMI 27.72 kg/m2 Well nourished, well developed, in no acute distress HEENT: normal, [place]/AT, EOMI Neck: no JVD, normal carotid upstroke, no bruit Cardiac:  normal S1, S2; RRR; no murmur Lungs:  clear to auscultation bilaterally, no wheezing, rhonchi or rales although he does have prolonged expiratory phase/distant breath sounds noted Abd: soft, nontender, no hepatomegaly, no bruits Ext: no edema, 2+ distal pulses Skin: warm and dry GU: deferred Neuro: no focal abnormalities noted, AAO x 3  EKG:    sinus rhythm, 73 with nonspecific ST-T wave changes     ASSESSMENT AND PLAN:  1. Angina/shortness of breath-dyspnea on exertion was a previous anginal equivalent for him prior to his bypass surgery in 1999. cardiac catheterization as above. Reassuring. It is likely that his underlying COPD as driving the majority of his dyspnea. Continue with medical management. Continue with current aggressive secondary prevention. 2. COPD-Dr. Delton Coombes.  Medications reviewed 3. Hypertension-stable. 4. Angina-nitroglycerin refilled. 5. Coronary artery disease-as described above-prior bypass 1999 6. Diabetes-insulin-dependent, primary physician.  Signed, Donato Schultz, MD [facility]  07/10/2013 9:55 AM

## 2013-07-10 NOTE — Patient Instructions (Addendum)
Your physician recommends that you continue on your current medications as directed. Please refer to the Current Medication list given to you today.  Your physician recommends that you have labs today: BMET, Lipid   Your physician wants you to follow-up in: 6 months with Dr. Anne Fu. You will receive a reminder letter in the mail two months in advance. If you don't receive a letter, please call our office to schedule the follow-up appointment.

## 2013-07-16 ENCOUNTER — Ambulatory Visit: Payer: Medicare Other | Admitting: Emergency Medicine

## 2013-07-16 ENCOUNTER — Other Ambulatory Visit: Payer: Self-pay

## 2013-07-16 MED ORDER — RAMIPRIL 10 MG PO CAPS: 10.0000 mg | ORAL_CAPSULE | Freq: Every day | ORAL | Status: DC

## 2013-07-17 ENCOUNTER — Encounter: Payer: Self-pay | Admitting: Cardiology

## 2013-07-22 ENCOUNTER — Other Ambulatory Visit: Payer: Self-pay | Admitting: Emergency Medicine

## 2013-08-28 ENCOUNTER — Other Ambulatory Visit: Payer: Self-pay | Admitting: Emergency Medicine

## 2013-09-03 ENCOUNTER — Other Ambulatory Visit: Payer: Self-pay

## 2013-09-03 MED ORDER — METOPROLOL SUCCINATE ER 50 MG PO TB24: 50.0000 mg | ORAL_TABLET | Freq: Every day | ORAL | Status: DC

## 2013-09-08 ENCOUNTER — Other Ambulatory Visit: Payer: Self-pay | Admitting: *Deleted

## 2013-09-08 MED ORDER — PRAVASTATIN SODIUM 40 MG PO TABS: 40.0000 mg | ORAL_TABLET | Freq: Every day | ORAL | Status: DC

## 2013-11-03 ENCOUNTER — Ambulatory Visit (INDEPENDENT_AMBULATORY_CARE_PROVIDER_SITE_OTHER): Payer: Medicare Other | Admitting: Emergency Medicine

## 2013-11-03 ENCOUNTER — Encounter: Payer: Self-pay | Admitting: Emergency Medicine

## 2013-11-03 ENCOUNTER — Other Ambulatory Visit: Payer: Self-pay

## 2013-11-03 VITALS — BP 140/80 | HR 97 | Ht 70.0 in | Wt 192.6 lb

## 2013-11-03 DIAGNOSIS — J449 Chronic obstructive pulmonary disease, unspecified: Secondary | ICD-10-CM

## 2013-11-03 DIAGNOSIS — J4489 Other specified chronic obstructive pulmonary disease: Secondary | ICD-10-CM

## 2013-11-03 DIAGNOSIS — I2510 Atherosclerotic heart disease of native coronary artery without angina pectoris: Secondary | ICD-10-CM

## 2013-11-03 MED ORDER — COLESEVELAM HCL 625 MG PO TABS: 1875.0000 mg | ORAL_TABLET | Freq: Two times a day (BID) | ORAL | Status: DC

## 2013-11-03 NOTE — Progress Notes (Signed)
[name] is a 68 year old gentleman with known coronary artery disease status post CABG. Also has a history of significant tobacco abuse and airflow limitation on pulmonary function testing. He has a positive bronchodilator response.   ROV 05/23/10 -- follows up today for his COPD, dyspnea. He is doing an exercise program. 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 No wheeze or cough during the day. He does cough at night when he lays flat. No real wt gain. No CP. Occas ankle swelling esp when he eats salt.   ROV 09/17/11 -- follows up today for his COPD, dyspnea. Last seen 05/2010. Has been doing fairly well, was exposed to URI about 3 weeks ago, sore throat, congestion that progressed to dyspnea. Was rx with azithro and has improved, cough and breathing both better. Did not receive steroids. Feels back to baseline. He is not very reliable with symbicort because it sometimes gives him sore throat. Tells me also that sometimes he skips the Spiriva and takes the Symbicort (seems to help his breathing more).    ROV 03/18/12 --  COPD, dyspnea.  He has been doing fairly well, is having congestion at night, doesn't wake him from sleep. Continues to take Symbicort and Spiriva, sometimes skips the pm dose Symbicort. Uses SABA very rarely. He snores and has had witnessed apneas by his wife.   ROV 09/16/12 -- COPD, dyspnea, BD responsiveness. Presents today noting that he developed cough, congestion, malaise about 10 days ago. PCP did flu swab > negative. Was started on pred taper a week ago - seemed to help him, then was exposed to person with URI over the last weekend. He is now having more cough again, chest pain from the cough. The phlegm is yellow to pink, no frank blood.    ROV 11/17/12 -- COPD, dyspnea, BD responsiveness.  F/u after URI's as above, AE-COPD. We extended his pred taper and treated w doxy >>  feels much better, probably back to baseline. He is on spiriva and symbicort. 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 He is coughing rarely, has some mucous production esp when he is supine at night.   ROV 05/01/13 -- COPD, dyspnea, BD responsiveness. He has a hx CAD/CABG in '99. Returns for f/u. He is noticing more exertional SOB over the last several months. No new cough, wheeze or adjunct symptoms. Denies CP or angina. 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 He has gained 5-10 lbs. Spiriva + symbicort are his maintenance. He has a cardiologist in [place], but not sure he want to follow there.   ROV 06/16/13 -- COPD, dyspnea, BD responsiveness. He has a hx CAD/CABG in '99.  He met and liked Dr Marlou Porch, has undergone nuclear stress and now L cath. Trial of breo seems to have been beneficial, but he isn't sure it's a lot better than symbicort.   ROV 11/03/13 -- COPD, dyspnea, BD responsiveness, CAD/CABG. He continues to have dyspnea with exertion about the same as before, not better for sure. Most noticeable with walking an incline. Remains on symbicort and spiriva but only taking the symbicort once a day. He cannot tolerate smoke, fumes. Is sensitive to pollen, dust.   EXAM:  Filed Vitals:   11/03/13 1500  BP: 140/80  Pulse: 97  Height: 5' 10"  (1.778 m)  Weight: 192 lb 9.6 oz (87.363 kg)  SpO2: 95%  Gen: Pleasant, well-nourished, in no distress,  normal affect  ENT: No lesions,  mouth clear,  oropharynx clear, no postnasal drip  Neck: No JVD, no TMG, no carotid bruits  Lungs: No use of accessory muscles, some end-exp wheeze B  Cardiovascular: RRR, heart sounds normal, no murmur or gallops, no peripheral edema  Musculoskeletal: No deformities, no cyanosis or clubbing  Neuro: alert, non focal  Skin: Warm, no lesions or rashes  CHRONIC OBSTRUCTIVE PULMONARY DISEASE - will continue Symbicort and Spiriva, albuterol prn - walking oximetry today - avoid dusts and pollen.  - rov  6  CORONARY ARTERY DISEASE - medical management planned by Dr Marlou Porch.

## 2013-11-03 NOTE — Assessment & Plan Note (Signed)
-   medical management planned by Dr Marlou Porch.

## 2013-11-03 NOTE — Patient Instructions (Addendum)
Walking oximetry today Increase your symbicort to 2 puffs twice a day Continue your spiriva daily We can refer you to see Dr Apolonio Schneiders with [facility] if you would like to see him. Please let us know.  Follow with Dr [name] in 6 months or sooner if you have any problems.

## 2013-11-03 NOTE — Assessment & Plan Note (Signed)
-   will continue Symbicort and Spiriva, albuterol prn - walking oximetry today - avoid dusts and pollen.  - rov 6

## 2013-11-16 ENCOUNTER — Other Ambulatory Visit: Payer: Self-pay | Admitting: Cardiology

## 2013-11-16 MED ORDER — COLESEVELAM HCL 625 MG PO TABS: 1875.0000 mg | ORAL_TABLET | Freq: Two times a day (BID) | ORAL | Status: DC

## 2013-11-18 ENCOUNTER — Ambulatory Visit (INDEPENDENT_AMBULATORY_CARE_PROVIDER_SITE_OTHER): Payer: Medicare Other | Admitting: Cardiology

## 2013-11-18 ENCOUNTER — Encounter: Payer: Self-pay | Admitting: Cardiology

## 2013-11-18 VITALS — BP 142/83 | HR 77 | Ht 70.0 in | Wt 187.0 lb

## 2013-11-18 DIAGNOSIS — R0600 Dyspnea, unspecified: Secondary | ICD-10-CM

## 2013-11-18 DIAGNOSIS — I2510 Atherosclerotic heart disease of native coronary artery without angina pectoris: Secondary | ICD-10-CM

## 2013-11-18 DIAGNOSIS — I2089 Other forms of angina pectoris: Secondary | ICD-10-CM

## 2013-11-18 DIAGNOSIS — E119 Type 2 diabetes mellitus without complications: Secondary | ICD-10-CM

## 2013-11-18 DIAGNOSIS — J449 Chronic obstructive pulmonary disease, unspecified: Secondary | ICD-10-CM

## 2013-11-18 DIAGNOSIS — R0609 Other forms of dyspnea: Secondary | ICD-10-CM

## 2013-11-18 DIAGNOSIS — R0989 Other specified symptoms and signs involving the circulatory and respiratory systems: Secondary | ICD-10-CM

## 2013-11-18 DIAGNOSIS — I208 Other forms of angina pectoris: Secondary | ICD-10-CM

## 2013-11-18 DIAGNOSIS — R1310 Dysphagia, unspecified: Secondary | ICD-10-CM

## 2013-11-18 NOTE — Patient Instructions (Addendum)
Your physician recommends that you continue on your current medications as directed. Please refer to the Current Medication list given to you today.  A chest x-ray takes a picture of the organs and structures inside the chest, including the heart, lungs, and blood vessels. This test can show several things, including, whether the heart is enlarges; whether fluid is building up in the lungs; and whether pacemaker / defibrillator leads are still in place. (To be done at the [facility] office you do not need an app you may walk in from the hours of 8am-5pm)  [place], Oscar Robinson, Oscar Robinson   You have been referred to [place] a scheduler form our office will call you to schedule  Your physician wants you to follow-up in: 6 months You will receive a reminder letter in the mail two months in advance. If you don't receive a letter, please call our office to schedule the follow-up appointment.

## 2013-11-18 NOTE — Progress Notes (Signed)
[facility]. [address] [place], [place]  [address] Phone: [phone] Fax:  [phone]  Date:  11/18/2013   ID:  Oscar Robinson, DOB 04/16/1946, MRN [id number]  PCP:  [name], MD   History of Present Illness: Oscar Robinson is a 68 y.o. male with coronary artery disease status post bypass surgery 1999, significant tobacco use, COPD with recent increasing shortness of breath, dyspnea on exertion here at the request of [name] for further evaluation.  Dyspnea with exertional with no chest pain, currently moderate in severity, relieved with rest with no obvious exacerbating factors, non-positional. No palpitations. Prior to CABG had symptoms of dyspnea mostly. Fatigue.   Last stress test was about 2 years ago he thinks in [place] at [facility]. [name] it was OK.   06/11/13 he underwent nuclear stress test where he had marked ST segment depression diffusely during exercise, relieved with rest as well as marked shortness of breath, pale. Blood pressure also decreased during exercise.  07/10/13 -native vessel LAD/diagonal bifurcation disease with patent LIMA to LAD/diagonal albeit small caliber vessel. Normal perfusion pattern on nuclear stress test. My concern previously was ST segment depression as well as decrease in blood pressure during exercise. This is described above. Ejection fraction 60%. We decided to continue with medical management. Metoprolol, amlodipine, pravastatin. No PCI.  11/18/13-he has been having some complaints of dysphagia, food getting stuck in his upper throat. One of his friends died of esophageal cancer. He is a former smoker. He still having shortness of breath. Getting treated for COPD by [name]. Prior cardiac catheterization reviewed with him.   Wt Readings from Last 3 Encounters:  11/18/13 187 lb (84.823 kg)  11/03/13 192 lb 9.6 oz (87.363 kg)  07/10/13 190 lb 6.4 oz (86.365 kg)     Past Medical History  Diagnosis Date  .  GERD (gastroesophageal reflux disease)   . Hypercholesteremia   . COPD (chronic obstructive pulmonary disease)   . Diabetes mellitus   . Tobacco abuse   . Coronary artery disease     Past Surgical History  Procedure Laterality Date  . Coronary artery bypass graft  1999  . Hand surgery      lt palm,ring finger,  . Hernia repair  1996    lt/rt ing hernia  . Dupuytren contracture release  04/10/2012    Procedure: DUPUYTREN CONTRACTURE RELEASE;  Surgeon: Cammie Sickle., MD;  Location: [facility];  Service: Orthopedics;  Laterality: Right;  palm, ring and small fingers dupuytrens contracture release    Current Outpatient Prescriptions  Medication Sig Dispense Refill  . ALPRAZolam (XANAX) 0.5 MG tablet Take 0.25 mg by mouth at bedtime.      Marland Kitchen amLODipine (NORVASC) 5 MG tablet Take 1 tablet (5 mg total) by mouth daily.  30 tablet  6  . aspirin 81 MG tablet Take 162 mg by mouth daily.       . Coenzyme Q10 (CO Q 10) 100 MG CAPS Take 100 mg by mouth daily.      . colesevelam (WELCHOL) 625 MG tablet Take 3 tablets (1,875 mg total) by mouth 2 (two) times daily with a meal.  360 tablet  3  . doxepin (SINEQUAN) 50 MG capsule Take 100 mg by mouth daily.      . Esomeprazole Magnesium (NEXIUM PO) Take 22.3 mg by mouth daily. OTC      . insulin glargine (LANTUS) 100 UNIT/ML injection Inject  25 Units into the skin daily.       . insulin lispro (HUMALOG KWIKPEN) 100 UNIT/ML SOPN Inject 8-15 Units into the skin 3 (three) times daily.      . metFORMIN (GLUCOPHAGE) 500 MG tablet Take 500 mg by mouth 2 (two) times daily.       . metoprolol succinate (TOPROL-XL) 50 MG 24 hr tablet Take 1 tablet (50 mg total) by mouth daily. Take with or immediately following a meal.  60 tablet  6  . Multiple Vitamin (MULTIVITAMIN) capsule Take 1 capsule by mouth daily.      . nitroGLYCERIN (NITROSTAT) 0.4 MG SL tablet Place 1 tablet (0.4 mg total) under the tongue every 5 (five) minutes as needed for chest  pain.  30 tablet  5  . Omega-3 Fatty Acids (FISH OIL) 1200 MG CAPS Take 1 capsule by mouth 2 (two) times daily.      . pravastatin (PRAVACHOL) 40 MG tablet Take 1 tablet (40 mg total) by mouth daily.  30 tablet  6  . ramipril (ALTACE) 10 MG capsule Take 1 capsule (10 mg total) by mouth daily.  30 capsule  6  . SPIRIVA HANDIHALER 18 MCG inhalation capsule PLACE ONE CAPSULE INTO INHALER AND INHALE DAILY  30 capsule  0  . SYMBICORT 160-4.5 MCG/ACT inhaler 2 puffs 2 (two) times daily.       Current Facility-Administered Medications  Medication Dose Route Frequency Provider Last Rate Last Dose  . 0.9 %  sodium chloride infusion   Intravenous Continuous Candee Furbish, MD        Allergies:   No Known Allergies  Social History:  The patient  reports that he quit smoking about 16 years ago. His smoking use included Cigarettes. He has a 70 pack-year smoking history. He has never used smokeless tobacco. He reports that he drinks alcohol.   Family History  Problem Relation Age of Onset  . Drug abuse Brother     Cocaine  . Heart disease Neg Hx   . Heart failure Neg Hx   . Diabetes Neg Hx   . Hypertension Neg Hx     ROS:  Please see the history of present illness.  Food has been getting food stuck at times, feels like upper throat.  Denies any bleeding, syncope, stroke like symptoms, dysphagia, orthopnea, PND. No rashes or   All other systems reviewed and negative.   PHYSICAL EXAM: VS:  BP 142/83  Pulse 77  Ht 5\' 10"  (1.778 m)  Wt 187 lb (84.823 kg)  BMI 26.83 kg/m2 Well nourished, well developed, in no acute distress HEENT: normal, /AT, EOMI Neck: no JVD, normal carotid upstroke, no bruit Cardiac:  normal S1, S2; RRR; no murmur Lungs:  clear to auscultation bilaterally, no wheezing, rhonchi or rales although he does have prolonged expiratory phase/distant breath sounds noted Abd: soft, nontender, no hepatomegaly, no bruits Ext: no edema, 2+ distal pulses Skin: warm and dry GU:  deferred Neuro: no focal abnormalities noted, AAO x 3, mildly anxious  EKG:    sinus rhythm, 73 with nonspecific ST-T wave changes     ASSESSMENT AND PLAN:  1. Shortness of breath-dyspnea on exertion was a previous anginal equivalent for him prior to his bypass surgery in 1999. cardiac catheterization as above. Reassuring. It is likely that his underlying COPD as driving the majority of his dyspnea. Continue with medical management. Continue with current aggressive secondary prevention. 2. COPD-[name]. Medications reviewed. With his dyspnea, I will check a  chest x-ray. 3. Hypertension-stable. 4. Angina-nitroglycerin refilled. 5. Coronary artery disease-as described above-prior bypass 1999 6. Diabetes-insulin-dependent, primary physician. 7. Dysphagia - will refer to gastroenterology. I did inform him that usually his primary physician would perform this task however he did ask that I go ahead and refer him to a gastroenterologist here in [place].   Signed, Candee Furbish, MD [facility]  11/18/2013 3:41 PM

## 2013-11-19 ENCOUNTER — Encounter: Payer: Self-pay | Admitting: Internal Medicine

## 2013-11-25 ENCOUNTER — Ambulatory Visit (INDEPENDENT_AMBULATORY_CARE_PROVIDER_SITE_OTHER)
Admission: RE | Admit: 2013-11-25 | Discharge: 2013-11-25 | Disposition: A | Discharge status: Home / Self Care | Payer: Medicare Other | Source: Ambulatory Visit | Attending: Cardiology | Admitting: Cardiology

## 2013-11-25 DIAGNOSIS — R0600 Dyspnea, unspecified: Secondary | ICD-10-CM

## 2013-11-25 DIAGNOSIS — R0609 Other forms of dyspnea: Secondary | ICD-10-CM

## 2013-11-25 DIAGNOSIS — R0989 Other specified symptoms and signs involving the circulatory and respiratory systems: Secondary | ICD-10-CM

## 2013-12-21 ENCOUNTER — Other Ambulatory Visit: Payer: Self-pay | Admitting: *Deleted

## 2013-12-21 MED ORDER — AMLODIPINE BESYLATE 5 MG PO TABS: 5.0000 mg | ORAL_TABLET | Freq: Every day | ORAL | Status: DC

## 2014-01-04 ENCOUNTER — Encounter: Payer: Self-pay | Admitting: Internal Medicine

## 2014-01-07 ENCOUNTER — Ambulatory Visit (INDEPENDENT_AMBULATORY_CARE_PROVIDER_SITE_OTHER): Payer: Medicare Other | Admitting: Internal Medicine

## 2014-01-07 ENCOUNTER — Encounter: Payer: Self-pay | Admitting: Internal Medicine

## 2014-01-07 VITALS — BP 140/76 | HR 76 | Ht 70.0 in | Wt 189.8 lb

## 2014-01-07 DIAGNOSIS — Z1211 Encounter for screening for malignant neoplasm of colon: Secondary | ICD-10-CM

## 2014-01-07 DIAGNOSIS — K219 Gastro-esophageal reflux disease without esophagitis: Secondary | ICD-10-CM

## 2014-01-07 DIAGNOSIS — R1310 Dysphagia, unspecified: Secondary | ICD-10-CM

## 2014-01-07 MED ORDER — MOVIPREP 100 G PO SOLR: ORAL | Status: DC

## 2014-01-07 NOTE — Progress Notes (Signed)
Patient ID: Oscar Robinson, male   DOB: Dec 27, 1945, 68 y.o.   MRN: [id number] HPI: Oscar Robinson is a 68 year old male with a past medical history of CAD status post CABG in 1999, tobacco use, COPD, and GERD who seen in consultation at the request of Dr. Wendie Agreste for evaluation of esophageal dysphagia. He is here alone today. He reports trouble with solid food dysphagia over the last year. This has been more noticeable over the last several months. He does take Nexium for chronic reflux disease and reports is controlled heartburn. He reports a good appetite and no weight loss. No early satiety. No odynophagia. Occasionally he has to force solid foods down with liquids, and occasionally liquid well "bubble back up". He reports normal bowel habits without diarrhea or constipation. No blood in his stool or melena. He recalls a normal colonoscopy for screening 10 years ago in [place], [place]. He denies a family history of GI tract malignancy Today he denies recent chest pain or dyspnea. He only seems to be improving. He is using inhalers and is not on oxygen  Past Medical History  Diagnosis Date  . GERD (gastroesophageal reflux disease)   . Hypercholesteremia   . COPD (chronic obstructive pulmonary disease)   . Diabetes mellitus   . Tobacco abuse   . Coronary artery disease   . Anxiety   . Depression     Past Surgical History  Procedure Laterality Date  . Coronary artery bypass graft  1999  . Hand surgery      lt palm,ring finger,  . Hernia repair  1996    lt/rt ing hernia  . Dupuytren contracture release  04/10/2012    Procedure: DUPUYTREN CONTRACTURE RELEASE;  Surgeon: Cammie Sickle., MD;  Location: [facility];  Service: Orthopedics;  Laterality: Right;  palm, ring and small fingers dupuytrens contracture release    Current Outpatient Prescriptions  Medication Sig Dispense Refill  . ALPRAZolam (XANAX) 0.5 MG tablet Take 0.25 mg by mouth at bedtime.      Marland Kitchen  amLODipine (NORVASC) 5 MG tablet Take 1 tablet (5 mg total) by mouth daily.  30 tablet  5  . aspirin 81 MG tablet Take 162 mg by mouth daily.       . Coenzyme Q10 (CO Q 10) 100 MG CAPS Take 100 mg by mouth daily.      . colesevelam (WELCHOL) 625 MG tablet Take 3 tablets (1,875 mg total) by mouth 2 (two) times daily with a meal.  360 tablet  3  . doxepin (SINEQUAN) 50 MG capsule Take 100 mg by mouth daily.      Marland Kitchen doxycycline (VIBRAMYCIN) 100 MG capsule Take 100 mg by mouth 2 (two) times daily.      . Esomeprazole Magnesium (NEXIUM PO) Take 22.3 mg by mouth daily. OTC      . insulin glargine (LANTUS) 100 UNIT/ML injection Inject 25 Units into the skin daily.       . insulin lispro (HUMALOG KWIKPEN) 100 UNIT/ML SOPN Inject 8-15 Units into the skin 3 (three) times daily.      . metFORMIN (GLUCOPHAGE) 500 MG tablet Take 500 mg by mouth 2 (two) times daily.       . metoprolol succinate (TOPROL-XL) 50 MG 24 hr tablet Take 1 tablet (50 mg total) by mouth daily. Take with or immediately following a meal.  60 tablet  6  . Multiple Vitamin (MULTIVITAMIN) capsule Take 1 capsule by mouth daily.      Marland Kitchen  nitroGLYCERIN (NITROSTAT) 0.4 MG SL tablet Place 1 tablet (0.4 mg total) under the tongue every 5 (five) minutes as needed for chest pain.  30 tablet  5  . Omega-3 Fatty Acids (FISH OIL) 1200 MG CAPS Take 1 capsule by mouth 2 (two) times daily.      . pravastatin (PRAVACHOL) 40 MG tablet Take 1 tablet (40 mg total) by mouth daily.  30 tablet  6  . ramipril (ALTACE) 10 MG capsule Take 1 capsule (10 mg total) by mouth daily.  30 capsule  6  . SPIRIVA HANDIHALER 18 MCG inhalation capsule PLACE ONE CAPSULE INTO INHALER AND INHALE DAILY  30 capsule  0  . SYMBICORT 160-4.5 MCG/ACT inhaler 2 puffs 2 (two) times daily.      Marland Kitchen MOVIPREP 100 G SOLR Use per prep instruction  1 kit  0   Current Facility-Administered Medications  Medication Dose Route Frequency Provider Last Rate Last Dose  . 0.9 %  sodium chloride infusion    Intravenous Continuous Candee Furbish, MD        No Known Allergies  Family History  Problem Relation Age of Onset  . Drug abuse Brother     Cocaine  . Heart disease Neg Hx   . Heart failure Neg Hx   . Diabetes Neg Hx   . Hypertension Neg Hx   . Colon cancer Neg Hx   . Esophageal cancer Neg Hx   . Prostate cancer Neg Hx   . Pancreatic cancer Neg Hx   . Kidney disease Neg Hx   . Liver disease Neg Hx     History  Substance Use Topics  . Smoking status: Former Smoker -- 2.00 packs/day for 35 years    Types: Cigarettes    Quit date: 09/27/1997  . Smokeless tobacco: Never Used  . Alcohol Use: Yes     Comment: occ    ROS: As per history of present illness, otherwise negative  BP 140/76  Pulse 76  Ht 5' 10"  (1.778 m)  Wt 189 lb 12.8 oz (86.093 kg)  BMI 27.23 kg/m2 Constitutional: Well-developed and well-nourished. No distress. HEENT: Normocephalic and atraumatic. Oropharynx is clear and moist. No oropharyngeal exudate. Conjunctivae are normal.  No scleral icterus. Neck: Neck supple. Trachea midline. Cardiovascular: Normal rate, regular rhythm and intact distal pulses. No M/R/G Pulmonary/chest: Effort normal and breath sounds normal. No wheezing, rales or rhonchi. Abdominal: Soft, nontender, nondistended. Bowel sounds active throughout.  Extremities: no clubbing, cyanosis, with trace pretibial edema Lymphadenopathy: No cervical adenopathy noted. Neurological: Alert and oriented to person place and time. Skin: Skin is warm and dry. No rashes noted. Psychiatric: Normal mood and affect. Behavior is normal.  RELEVANT LABS AND IMAGING: CBC    Component Value Date/Time   WBC 8.1 06/11/2013 1531   RBC 4.17* 06/11/2013 1531   HGB 11.4* 06/11/2013 1531   HCT 35.1* 06/11/2013 1531   PLT 235.0 06/11/2013 1531   MCV 84.2 06/11/2013 1531   MCHC 32.5 06/11/2013 1531   RDW 16.5* 06/11/2013 1531   LYMPHSABS 2.3 06/11/2013 1531   MONOABS 0.9 06/11/2013 1531   EOSABS 0.2 06/11/2013 1531    BASOSABS 0.0 06/11/2013 1531    CMP     Component Value Date/Time   NA 136 07/10/2013 1014   K 4.6 07/10/2013 1014   CL 102 07/10/2013 1014   CO2 28 07/10/2013 1014   GLUCOSE 123* 07/10/2013 1014   BUN 16 07/10/2013 1014   CREATININE 1.1 07/10/2013 1014   CALCIUM  9.3 07/10/2013 1014   PROT 6.9 07/10/2013 1014   ALBUMIN 3.7 07/10/2013 1014   AST 26 07/10/2013 1014   ALT 21 07/10/2013 1014   ALKPHOS 52 07/10/2013 1014   BILITOT 0.4 07/10/2013 1014   GFRNONAA 84* 04/09/2012 1500   GFRAA >90 04/09/2012 1500    ASSESSMENT/PLAN: 68 year old male with a past medical history of CAD status post CABG in 1999, tobacco use, COPD, and GERD who seen in consultation at the request of Dr. Wendie Agreste for evaluation of esophageal dysphagia.   1.  GERD with progressive esophageal dysphagia -- given his history of reflux disease and now dysphagia, I recommended upper endoscopy. He may require dilation. We discussed EGD including dilation along with the risks and benefits and he is agreeable to proceed. He will continue Nexium once daily for now. He is advised to take this 30 minutes before breakfast. Hold aspirin 5 days before dilation  2.  CRC screening -- last screening colonoscopy 10 years ago. Average risk. He would be due repeat screening at this time. Colonoscopy will be performed on the same day as his upper endoscopy, and after discussion of the risks and benefits he is agreeable to proceed. We will request records from his first colonoscopy.

## 2014-01-07 NOTE — Patient Instructions (Addendum)
You have been scheduled for a Endoscopy/colonoscopy with propofol. Please follow written instructions given to you at your visit today.  Please pick up your prep kit at the pharmacy within the next 1-3 days. If you use inhalers (even only as needed), please bring them with you on the day of your procedure. Your physician has requested that you go to www.startemmi.com and enter the access code given to you at your visit today. This web site gives a general overview about your procedure. However, you should still follow specific instructions given to you by our office regarding your preparation for the procedure.  We have sent the following medications to your pharmacy for you to pick up at your convenience: Moviprep; you were given instructions today at your office visit   [place]

## 2014-01-08 ENCOUNTER — Telehealth: Payer: Self-pay | Admitting: Internal Medicine

## 2014-01-08 NOTE — Telephone Encounter (Signed)
Explained to the patient that he is due for a screening colonoscopy.  He is confused about his instructions.  I went over with him and his wife in detail and all questions answered.  He will keep both procedures as scheduled for Tuesday

## 2014-01-12 ENCOUNTER — Ambulatory Visit (AMBULATORY_SURGERY_CENTER): Payer: Medicare Other | Admitting: Internal Medicine

## 2014-01-12 ENCOUNTER — Other Ambulatory Visit: Payer: Self-pay | Admitting: Gastroenterology

## 2014-01-12 ENCOUNTER — Encounter: Payer: Self-pay | Admitting: Internal Medicine

## 2014-01-12 VITALS — BP 149/81 | HR 73 | Temp 96.8°F | Resp 20 | Ht 70.0 in | Wt 189.0 lb

## 2014-01-12 DIAGNOSIS — K2970 Gastritis, unspecified, without bleeding: Secondary | ICD-10-CM

## 2014-01-12 DIAGNOSIS — D131 Benign neoplasm of stomach: Secondary | ICD-10-CM

## 2014-01-12 DIAGNOSIS — K222 Esophageal obstruction: Secondary | ICD-10-CM

## 2014-01-12 DIAGNOSIS — K2990 Gastroduodenitis, unspecified, without bleeding: Secondary | ICD-10-CM

## 2014-01-12 DIAGNOSIS — D126 Benign neoplasm of colon, unspecified: Secondary | ICD-10-CM

## 2014-01-12 DIAGNOSIS — K219 Gastro-esophageal reflux disease without esophagitis: Secondary | ICD-10-CM

## 2014-01-12 DIAGNOSIS — R1310 Dysphagia, unspecified: Secondary | ICD-10-CM

## 2014-01-12 DIAGNOSIS — Z1211 Encounter for screening for malignant neoplasm of colon: Secondary | ICD-10-CM

## 2014-01-12 LAB — GLUCOSE, CAPILLARY
GLUCOSE-CAPILLARY: 67 mg/dL — AB (ref 70–99)
Glucose-Capillary: 100 mg/dL — ABNORMAL HIGH (ref 70–99)
Glucose-Capillary: 73 mg/dL (ref 70–99)
Glucose-Capillary: 133 mg/dL — ABNORMAL HIGH (ref 70–99)

## 2014-01-12 MED ORDER — SODIUM CHLORIDE 0.9 % IV SOLN: 500.0000 mL | INTRAVENOUS | Status: DC

## 2014-01-12 MED ORDER — ESOMEPRAZOLE MAGNESIUM 40 MG PO CPDR: 40.0000 mg | DELAYED_RELEASE_CAPSULE | Freq: Every day | ORAL | Status: DC

## 2014-01-12 NOTE — Op Note (Signed)
[facility]  [address]. [place], [address]   ENDOSCOPY PROCEDURE REPORT  PATIENT: Oscar Robinson, Oscar Robinson.  MR#: [id number] BIRTHDATE: 07/13/1946 , 67  yrs. old GENDER: Male ENDOSCOPIST: Jerene Bears, MD REFERRED BY:  Cecille Amsterdam, M.D. PROCEDURE DATE:  01/12/2014 PROCEDURE:  EGD w/ biopsy; EGD with balloon dilation ASA CLASS:     Class III INDICATIONS:  Dysphagia.   history of GERD. MEDICATIONS: MAC sedation, administered by CRNA and propofol (Diprivan) 300mg  IV TOPICAL ANESTHETIC: none  DESCRIPTION OF PROCEDURE: After the risks benefits and alternatives of the procedure were thoroughly explained, informed consent was obtained.  The LB [id number] endoscope was introduced through the mouth and advanced to the second portion of the duodenum. Without limitations.  The instrument was slowly withdrawn as the mucosa was fully examined.   ESOPHAGUS: LA Grade C reflux esophagitis was found in the lower third of the esophagus.   A stricture was found 35 cm from the incisors.  There was one small-mouthed diverticulum at the level of the stricture.  The stenosis was traversable with the endoscope. A 4 cm hiatal hernia was noted.   Dilation was performed with TTS balloon under direct visualization to 15 mm and 16.5 mm  STOMACH: There was mild antral gastropathy noted.  Cold forcep biopsies were taken at the antrum and angularis.  1 small 4 mm polyp in the gastric body, biopsy performed and polyp was removed completely.  DUODENUM: The duodenal mucosa showed no abnormalities in the bulb and second portion of the duodenum.  Retroflexed views revealed a hiatal hernia.     The scope was then withdrawn from the patient and the procedure completed.  COMPLICATIONS: There were no complications. ENDOSCOPIC IMPRESSION: 1.   Esophagitis consistent with reflux esophagitis in the lower third of the esophagus 2.   Stricture was found 35 cm from the incisors; dilation to  16.5 mm with TTS balloon 3.   4 cm hiatal hernia 4.   There was mild antral gastropathy noted; small gastric polyp; biopsies 5.   The duodenal mucosa showed no abnormalities in the bulb and second portion of the duodenum  RECOMMENDATIONS: 1.  Increase Nexium to 40 mg twice daily, 30 minutes before the first and last meal of the day for reflux esophagitis 2.  Office follow-up next available 3.  Await pathology results  eSigned:  Jerene Bears, MD 01/12/2014 4:05 PM        CC:The Patient and Cecille Amsterdam, MD

## 2014-01-12 NOTE — Progress Notes (Signed)
Called to room to assist during endoscopic procedure.  Patient ID and intended procedure confirmed with present staff. Received instructions for my participation in the procedure from the performing physician.  

## 2014-01-12 NOTE — Patient Instructions (Signed)
YOU HAD AN ENDOSCOPIC PROCEDURE TODAY AT [facility]: Refer to the procedure report that was given to you for any specific questions about what was found during the examination.  If the procedure report does not answer your questions, please call your gastroenterologist to clarify.  If you requested that your care partner not be given the details of your procedure findings, then the procedure report has been included in a sealed envelope for you to review at your convenience later.  YOU SHOULD EXPECT: Some feelings of bloating in the abdomen. Passage of more gas than usual.  Walking can help get rid of the air that was put into your GI tract during the procedure and reduce the bloating. If you had a lower endoscopy (such as a colonoscopy or flexible sigmoidoscopy) you may notice spotting of blood in your stool or on the toilet paper. If you underwent a bowel prep for your procedure, then you may not have a normal bowel movement for a few days.  DIET: FOLLOW DILATION HANDOUT.  ACTIVITY: Your care partner should take you home directly after the procedure.  You should plan to take it easy, moving slowly for the rest of the day.  You can resume normal activity the day after the procedure however you should NOT DRIVE or use heavy machinery for 24 hours (because of the sedation medicines used during the test).    SYMPTOMS TO REPORT IMMEDIATELY: A gastroenterologist can be reached at any hour.  During normal business hours, 8:30 AM to 5:00 PM Monday through Friday, call [phone].  After hours and on weekends, please call the GI answering service at [phone] who will take a message and have the physician on call contact you.   Following lower endoscopy (colonoscopy or flexible sigmoidoscopy):  Excessive amounts of blood in the stool  Significant tenderness or worsening of abdominal pains  Swelling of the abdomen that is new, acute  Fever of 100F or higher  Following upper  endoscopy (EGD)  Vomiting of blood or coffee ground material  New chest pain or pain under the shoulder blades  Painful or persistently difficult swallowing  New shortness of breath  Fever of 100F or higher  Black, tarry-looking stools  FOLLOW UP: If any biopsies were taken you will be contacted by phone or by letter within the next 1-3 weeks.  Call your gastroenterologist if you have not heard about the biopsies in 3 weeks.  Our staff will call the home number listed on your records the next business day following your procedure to check on you and address any questions or concerns that you may have at that time regarding the information given to you following your procedure. This is a courtesy call and so if there is no answer at the home number and we have not heard from you through the emergency physician on call, we will assume that you have returned to your regular daily activities without incident.  SIGNATURES/CONFIDENTIALITY: You and/or your care partner have signed paperwork which will be entered into your electronic medical record.  These signatures attest to the fact that that the information above on your After Visit Summary has been reviewed and is understood.  Full responsibility of the confidentiality of this discharge information lies with you and/or your care-partner.   Resume medications. Information given on Dilation Diet, Esophagitis, Hiatal Hernia, Polyps, Diverticulosis and High Fiber Diet with discharge instructions.

## 2014-01-12 NOTE — Progress Notes (Addendum)
1600 pt. CBG= 67 NO s/s of hypoglycemia pt. Stated that he feels ok, D5w hung. Dr. Hilarie Fredrickson aware. 1630 CBG=73, 1632 pt. Given 8oz OJ and one pack of graham crackers given. Pt. Stated he feels o.k. 1641 CBG=133. PT. Stable.

## 2014-01-12 NOTE — Progress Notes (Signed)
Report to PACU, RN, vss, BBS= Clear.  

## 2014-01-12 NOTE — Op Note (Signed)
[facility] [address] [place], [address]   COLONOSCOPY PROCEDURE REPORT  PATIENT: Oscar Robinson, Oscar Robinson.  MR#: [id number] BIRTHDATE: 1946/04/07 , 67  yrs. old GENDER: Male ENDOSCOPIST: Jerene Bears, MD PROCEDURE DATE:  01/12/2014 PROCEDURE:   Colonoscopy with snare polypectomy First Screening Colonoscopy - Avg.  risk and is 50 yrs.  old or older - No.  Prior Negative Screening - Now for repeat screening. 10 or more years since last screening  History of Adenoma - Now for follow-up colonoscopy & has been > or = to 3 yrs.  N/A  Polyps Removed Today? Yes. ASA CLASS:   Class III INDICATIONS:average risk screening and Last colonoscopy performed 10 years ago. MEDICATIONS: MAC sedation, administered by CRNA and propofol (Diprivan) 120mg  IV  DESCRIPTION OF PROCEDURE:   After the risks benefits and alternatives of the procedure were thoroughly explained, informed consent was obtained.  A digital rectal exam revealed no rectal mass.   The LB [id number]  endoscope was introduced through the anus and advanced to the cecum, which was identified by both the appendix and ileocecal valve. No adverse events experienced. The quality of the prep was good, using MoviPrep  The instrument was then slowly withdrawn as the colon was fully examined.   COLON FINDINGS: Two sessile polyps ranging between 3-78mm in size were found in the ascending colon and descending colon. Polypectomy was performed using cold snare.  All resections were complete and all polyp tissue was completely retrieved.   Moderate diverticulosis was noted at the splenic flexure, in the descending colon, and sigmoid colon.  Retroflexed views revealed no abnormalities. The time to cecum=2 minutes 39 seconds.  Withdrawal time=8 minutes 54 seconds.  The scope was withdrawn and the procedure completed. COMPLICATIONS: There were no complications.  ENDOSCOPIC IMPRESSION: 1.   Two sessile polyps ranging between  3-46mm in size were found in the ascending colon and descending colon; Polypectomy was performed using cold snare 2.   Moderate diverticulosis was noted at the splenic flexure, in the descending colon, and sigmoid colon  RECOMMENDATIONS: 1.  Await pathology results 2.  High fiber diet 3.  If the polyps removed today are proven to be adenomatous (pre-cancerous) polyps, you will need a repeat colonoscopy in 5 years.  Otherwise you should continue to follow colorectal cancer screening guidelines for "routine risk" patients with colonoscopy in 10 years.  You will receive a letter within 1-2 weeks with the results of your biopsy as well as final recommendations.  Please call my office if you have not received a letter after 3 weeks.  eSigned:  Jerene Bears, MD 01/12/2014 4:10 PM      cc: The Patient and Cecille Amsterdam, MD

## 2014-01-13 ENCOUNTER — Telehealth: Payer: Self-pay | Admitting: *Deleted

## 2014-01-13 NOTE — Telephone Encounter (Signed)
  Follow up Call-  Call back number 01/12/2014  Post procedure Call Back phone  # [phone] hm  Permission to leave phone message Yes     Patient questions:  Do you have a fever, pain , or abdominal swelling? no Pain Score  0 *  Have you tolerated food without any problems? yes  Have you been able to return to your normal activities? yes  Do you have any questions about your discharge instructions: Diet   no Medications  no Follow up visit  no  Do you have questions or concerns about your Care? no  Actions: * If pain score is 4 or above: No action needed, pain <4.

## 2014-01-19 ENCOUNTER — Encounter: Payer: Self-pay | Admitting: Internal Medicine

## 2014-02-08 ENCOUNTER — Other Ambulatory Visit: Payer: Self-pay | Admitting: Emergency Medicine

## 2014-02-18 ENCOUNTER — Other Ambulatory Visit: Payer: Self-pay | Admitting: Emergency Medicine

## 2014-03-09 ENCOUNTER — Encounter: Payer: Self-pay | Admitting: Emergency Medicine

## 2014-03-09 ENCOUNTER — Ambulatory Visit (INDEPENDENT_AMBULATORY_CARE_PROVIDER_SITE_OTHER): Payer: Medicare Other | Admitting: Emergency Medicine

## 2014-03-09 ENCOUNTER — Telehealth: Payer: Self-pay | Admitting: Emergency Medicine

## 2014-03-09 VITALS — BP 142/74 | HR 72 | Temp 97.2°F | Ht 69.0 in | Wt 193.0 lb

## 2014-03-09 DIAGNOSIS — F3289 Other specified depressive episodes: Secondary | ICD-10-CM

## 2014-03-09 DIAGNOSIS — R0989 Other specified symptoms and signs involving the circulatory and respiratory systems: Secondary | ICD-10-CM

## 2014-03-09 DIAGNOSIS — J449 Chronic obstructive pulmonary disease, unspecified: Secondary | ICD-10-CM

## 2014-03-09 DIAGNOSIS — R0609 Other forms of dyspnea: Secondary | ICD-10-CM

## 2014-03-09 DIAGNOSIS — F32A Depression, unspecified: Secondary | ICD-10-CM | POA: Insufficient documentation

## 2014-03-09 DIAGNOSIS — R0600 Dyspnea, unspecified: Secondary | ICD-10-CM

## 2014-03-09 DIAGNOSIS — Z23 Encounter for immunization: Secondary | ICD-10-CM

## 2014-03-09 DIAGNOSIS — F329 Major depressive disorder, single episode, unspecified: Secondary | ICD-10-CM

## 2014-03-09 DIAGNOSIS — I208 Other forms of angina pectoris: Secondary | ICD-10-CM

## 2014-03-09 NOTE — Progress Notes (Signed)
[name] is a 68 year old gentleman with known coronary artery disease status post CABG. Also has a history of significant tobacco abuse and airflow limitation on pulmonary function testing. He has a positive bronchodilator response.   ROV 05/23/10 -- follows up today for his COPD, dyspnea. He is doing an exercise program. 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 No wheeze or cough during the day. He does cough at night when he lays flat. No real wt gain. No CP. Occas ankle swelling esp when he eats salt.   ROV 09/17/11 -- follows up today for his COPD, dyspnea. Last seen 05/2010. Has been doing fairly well, was exposed to URI about 3 weeks ago, sore throat, congestion that progressed to dyspnea. Was rx with azithro and has improved, cough and breathing both better. Did not receive steroids. Feels back to baseline. He is not very reliable with symbicort because it sometimes gives him sore throat. Tells me also that sometimes he skips the Spiriva and takes the Symbicort (seems to help his breathing more).    ROV 03/18/12 --  COPD, dyspnea.  He has been doing fairly well, is having congestion at night, doesn't wake him from sleep. Continues to take Symbicort and Spiriva, sometimes skips the pm dose Symbicort. Uses SABA very rarely. He snores and has had witnessed apneas by his wife.   ROV 09/16/12 -- COPD, dyspnea, BD responsiveness. Presents today noting that he developed cough, congestion, malaise about 10 days ago. PCP did flu swab > negative. Was started on pred taper a week ago - seemed to help him, then was exposed to person with URI over the last weekend. He is now having more cough again, chest pain from the cough. The phlegm is yellow to pink, no frank blood.    ROV 11/17/12 -- COPD, dyspnea, BD responsiveness.  F/u after URI's as above, AE-COPD. We extended his pred taper and treated w doxy >>  feels much better, probably back to baseline. He is on spiriva and symbicort. 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 He is coughing rarely, has some mucous production esp when he is supine at night.   ROV 05/01/13 -- COPD, dyspnea, BD responsiveness. He has a hx CAD/CABG in '99. Returns for f/u. He is noticing more exertional SOB over the last several months. No new cough, wheeze or adjunct symptoms. Denies CP or angina. 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 He has gained 5-10 lbs. Spiriva + symbicort are his maintenance. He has a cardiologist in [place], but not sure he want to follow there.   ROV 06/16/13 -- COPD, dyspnea, BD responsiveness. He has a hx CAD/CABG in '99.  He met and liked Dr Marlou Porch, has undergone nuclear stress and now L cath. Trial of breo seems to have been beneficial, but he isn't sure it's a lot better than symbicort.   ROV 11/03/13 -- COPD, dyspnea, BD responsiveness, CAD/CABG. He continues to have dyspnea with exertion about the same as before, not better for sure. Most noticeable with walking an incline. Remains on symbicort and spiriva but only taking the symbicort once a day. He cannot tolerate smoke, fumes. Is sensitive to pollen, dust.   Acute OV 03/09/14 -- hx of COPD and BD responsiveness, exertional dyspnea. He was evaluated by Dr Marlou Porch since last visit > his cath was reassuring, argued against his dyspnea being related  to coronary disease. He is still having exertional SOB, difficulty w tasks. This is probably worse over the last year. He is on symbicort and spiriva.   EXAM:  Filed Vitals:   03/09/14 1638  BP: 142/74  Pulse: 72  Temp: 97.2 F (36.2 C)  TempSrc: Oral  Height: $Remove'5\' 9"'jcObbBH$  (1.753 m)  Weight: 193 lb (87.544 kg)  SpO2: 98%   Gen: Pleasant, well-nourished, in no distress,  normal affect  ENT: No lesions,  mouth clear,  oropharynx clear, no postnasal drip  Neck: No JVD, no TMG, no carotid bruits  Lungs: No use of accessory muscles, no  wheezes  Cardiovascular: RRR, heart sounds normal, no murmur or gallops, no peripheral edema  Musculoskeletal: No deformities, no cyanosis or clubbing  Neuro: alert, non focal  Skin: Warm, no lesions or rashes   Depression Pt describes more depression, some of it related to his physical condition and limitations. He would like to speak to a therapist.  - I will refer him to [facility]; if they don't do psychotherapy then I will send him to [facility], Dr Molinda Bailiff.   CHRONIC OBSTRUCTIVE PULMONARY DISEASE Severe disease. His dyspnea has increased steadily. I am concerned that this is related to his lung disease. His cards eval was reassuring.  - will repeat his PFT to compare w 2008 - walking oximetry today - Prevnar today - continue same BD, will consider a change next time - if PFT are stable then will order a CPST - rov next available

## 2014-03-09 NOTE — Assessment & Plan Note (Signed)
Pt describes more depression, some of it related to his physical condition and limitations. He would like to speak to a therapist.  - I will refer him to [facility]; if they don't do psychotherapy then I will send him to [facility], Dr Molinda Bailiff.

## 2014-03-09 NOTE — Telephone Encounter (Signed)
Called spoke with pt. appt scheduled to see RB today at 4:30. Nothing further needed

## 2014-03-09 NOTE — Patient Instructions (Signed)
Please continue your Spiriva and Symbicort We will perform walking oximetry today We will repeat your pulmonary function testing  We will give prevnar-13 vaccine today.  Follow with Dr [name] next available with full PFT.

## 2014-03-09 NOTE — Assessment & Plan Note (Addendum)
Severe disease. His dyspnea has increased steadily. I am concerned that this is related to his lung disease. His cards eval was reassuring.  - will repeat his PFT to compare w 2008 - walking oximetry today - Prevnar today - continue same BD, will consider a change next time - if PFT are stable then will order a CPST - rov next available

## 2014-03-15 ENCOUNTER — Telehealth: Payer: Self-pay | Admitting: Emergency Medicine

## 2014-03-15 NOTE — Telephone Encounter (Signed)
lmomtcb x1 

## 2014-03-16 NOTE — Telephone Encounter (Signed)
Called spoke with patient, he has already spoke with [facility] - the call was regarding to the referral to psychiatry made at last ov. Nothing further needed; will sign off.

## 2014-03-17 ENCOUNTER — Telehealth: Payer: Self-pay | Admitting: Emergency Medicine

## 2014-03-17 ENCOUNTER — Other Ambulatory Visit: Payer: Self-pay

## 2014-03-17 MED ORDER — RAMIPRIL 10 MG PO CAPS: 10.0000 mg | ORAL_CAPSULE | Freq: Every day | ORAL | Status: DC

## 2014-03-17 NOTE — Telephone Encounter (Signed)
General 03/16/2014 11:05 AM [name]        Note    Oscar Robinson is not taking new pt's was given name of tom hedding at the same office pt give the name and # to call and set up this appt [place] pt and made him aware of above. Nothing further needed

## 2014-04-01 ENCOUNTER — Ambulatory Visit: Payer: Medicare Other | Admitting: Emergency Medicine

## 2014-04-06 ENCOUNTER — Other Ambulatory Visit: Payer: Self-pay | Admitting: *Deleted

## 2014-04-06 MED ORDER — PRAVASTATIN SODIUM 40 MG PO TABS: 40.0000 mg | ORAL_TABLET | Freq: Every day | ORAL | Status: DC

## 2014-04-07 ENCOUNTER — Ambulatory Visit (INDEPENDENT_AMBULATORY_CARE_PROVIDER_SITE_OTHER): Payer: Medicare Other | Admitting: Emergency Medicine

## 2014-04-07 ENCOUNTER — Encounter: Payer: Self-pay | Admitting: Emergency Medicine

## 2014-04-07 VITALS — BP 118/68 | HR 75 | Ht 69.0 in | Wt 191.0 lb

## 2014-04-07 DIAGNOSIS — I208 Other forms of angina pectoris: Secondary | ICD-10-CM

## 2014-04-07 DIAGNOSIS — R0609 Other forms of dyspnea: Secondary | ICD-10-CM

## 2014-04-07 DIAGNOSIS — R0600 Dyspnea, unspecified: Secondary | ICD-10-CM

## 2014-04-07 DIAGNOSIS — G4733 Obstructive sleep apnea (adult) (pediatric): Secondary | ICD-10-CM

## 2014-04-07 DIAGNOSIS — R0989 Other specified symptoms and signs involving the circulatory and respiratory systems: Secondary | ICD-10-CM

## 2014-04-07 DIAGNOSIS — J449 Chronic obstructive pulmonary disease, unspecified: Secondary | ICD-10-CM

## 2014-04-07 LAB — PULMONARY FUNCTION TEST
DL/VA % pred: 57 %
DL/VA: 2.61 ml/min/mmHg/L
DLCO unc % pred: 47 %
DLCO unc: 14.73 ml/min/mmHg
FEF 25-75 PRE: 0.52 L/s
FEF 25-75 Post: 0.87 L/sec
FEF2575-%CHANGE-POST: 68 %
FEF2575-%Pred-Post: 34 %
FEF2575-%Pred-Pre: 20 %
FEV1-%CHANGE-POST: 25 %
FEV1-%PRED-POST: 49 %
FEV1-%PRED-PRE: 39 %
FEV1-POST: 1.6 L
FEV1-PRE: 1.28 L
FEV1FVC-%CHANGE-POST: 9 %
FEV1FVC-%PRED-PRE: 69 %
FEV6-%Change-Post: 18 %
FEV6-%Pred-Post: 66 %
FEV6-%Pred-Pre: 56 %
FEV6-Post: 2.76 L
FEV6-Pre: 2.34 L
FEV6FVC-%Change-Post: 1 %
FEV6FVC-%PRED-PRE: 101 %
FEV6FVC-%Pred-Post: 103 %
FVC-%Change-Post: 14 %
FVC-%Pred-Post: 65 %
FVC-%Pred-Pre: 57 %
FVC-Post: 2.84 L
FVC-Pre: 2.49 L
PRE FEV6/FVC RATIO: 96 %
Post FEV1/FVC ratio: 56 %
Post FEV6/FVC ratio: 97 %
Pre FEV1/FVC ratio: 51 %
RV % PRED: 145 %
RV: 3.41 L
TLC % pred: 89 %
TLC: 6.1 L

## 2014-04-07 NOTE — Assessment & Plan Note (Signed)
Suspected OSA, will check PSG

## 2014-04-07 NOTE — Patient Instructions (Signed)
Please stop Spiriva anad Symbicort for now We will start 2 new medications >  - Anoro one inhalation daily - Arnuity one inhalation daily.  Use albuterol 2 puffs as needed for shortness of breath We will perform a sleep study to look for sleep apnea.  Follow with Dr [name] in 1 month

## 2014-04-07 NOTE — Assessment & Plan Note (Signed)
Severe copd, clinical and PFT worsening, dynamic hyperinflation.  - stop spiriva and symbicort - start Anoro + Arnuity each qd - rov to discuss results

## 2014-04-07 NOTE — Progress Notes (Signed)
Oscar Robinson is a 68 year old gentleman with known coronary artery disease status post CABG. Also has a history of significant tobacco abuse and airflow limitation on pulmonary function testing. He has a positive bronchodilator response.   ROV 05/23/10 -- follows up today for his COPD, dyspnea. He is doing an exercise program. 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 No wheeze or cough during the day. He does cough at night when he lays flat. No real wt gain. No CP. Occas ankle swelling esp when he eats salt.   ROV 09/17/11 -- follows up today for his COPD, dyspnea. Last seen 05/2010. Has been doing fairly well, was exposed to URI about 3 weeks ago, sore throat, congestion that progressed to dyspnea. Was rx with azithro and has improved, cough and breathing both better. Did not receive steroids. Feels back to baseline. He is not very reliable with symbicort because it sometimes gives him sore throat. Tells me also that sometimes he skips the Spiriva and takes the Symbicort (seems to help his breathing more).    ROV 03/18/12 --  COPD, dyspnea.  He has been doing fairly well, is having congestion at night, doesn't wake him from sleep. Continues to take Symbicort and Spiriva, sometimes skips the pm dose Symbicort. Uses SABA very rarely. He snores and has had witnessed apneas by his wife.   ROV 09/16/12 -- COPD, dyspnea, BD responsiveness. Presents today noting that he developed cough, congestion, malaise about 10 days ago. PCP did flu swab > negative. Was started on pred taper a week ago - seemed to help him, then was exposed to person with URI over the last weekend. He is now having more cough again, chest pain from the cough. The phlegm is yellow to pink, no frank blood.    ROV 11/17/12 -- COPD, dyspnea, BD responsiveness.  F/u after URI's as above, AE-COPD. We extended his pred taper and treated w doxy >>  feels much better, probably back to baseline. He is on spiriva and symbicort. 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 He is coughing rarely, has some mucous production esp when he is supine at night.   ROV 05/01/13 -- COPD, dyspnea, BD responsiveness. He has a hx CAD/CABG in '99. Returns for f/u. He is noticing more exertional SOB over the last several months. No new cough, wheeze or adjunct symptoms. Denies CP or angina. 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 He has gained 5-10 lbs. Spiriva + symbicort are his maintenance. He has a cardiologist in [place], but not sure he want to follow there.   ROV 06/16/13 -- COPD, dyspnea, BD responsiveness. He has a hx CAD/CABG in '99.  He met and liked Dr Marlou Porch, has undergone nuclear stress and now L cath. Trial of breo seems to have been beneficial, but he isn't sure it's a lot better than symbicort.   ROV 11/03/13 -- COPD, dyspnea, BD responsiveness, CAD/CABG. He continues to have dyspnea with exertion about the same as before, not better for sure. Most noticeable with walking an incline. Remains on symbicort and spiriva but only taking the symbicort once a day. He cannot tolerate smoke, fumes. Is sensitive to pollen, dust.   Acute OV 03/09/14 -- hx of COPD and BD responsiveness, exertional dyspnea. He was evaluated by Dr Marlou Porch since last visit > his cath was reassuring, argued against his dyspnea being related  to coronary disease. He is still having exertional SOB, difficulty w tasks. This is probably worse over the last year. He is on symbicort and spiriva.   ROV 04/07/14 -- followup visit for COPD with asthmatic features, exertional dyspnea. He has been managed with Symbicort and Spiriva. He has been more dyspneic, planned to get repeat PFT as below.   Spirometry 01/07/07 - severe airflow limitation with an FEV1 of 1.56 L or 49% of predicted with a positive bronchodilator response. Repeat PFT 04/07/14 showed very severe airflow limitation with an FEV1 of 1.28 L or 49%  dictated that improved to 1.60 L or 49% predicted after bronchodilator. His lungs are hyperinflated and his diffusion capacity is decreased.   EXAM:  Filed Vitals:   04/07/14 1611  BP: 118/68  Pulse: 75  Height: $Remove'5\' 9"'fhGXvHp$  (1.753 m)  Weight: 191 lb (86.637 kg)  SpO2: 96%   Gen: Pleasant, well-nourished, in no distress,  normal affect  ENT: No lesions,  mouth clear,  oropharynx clear, no postnasal drip  Neck: No JVD, no TMG, no carotid bruits  Lungs: No use of accessory muscles, no wheezes  Cardiovascular: RRR, heart sounds normal, no murmur or gallops, no peripheral edema  Musculoskeletal: No deformities, no cyanosis or clubbing  Neuro: alert, non focal  Skin: Warm, no lesions or rashes   CHRONIC OBSTRUCTIVE PULMONARY DISEASE Severe copd, clinical and PFT worsening, dynamic hyperinflation.  - stop spiriva and symbicort - start Anoro + Arnuity each qd - rov to discuss results   Obstructive sleep apnea Suspected OSA, will check PSG

## 2014-04-07 NOTE — Progress Notes (Signed)
PFT done today. 

## 2014-05-03 ENCOUNTER — Other Ambulatory Visit: Payer: Self-pay | Admitting: Cardiology

## 2014-05-06 ENCOUNTER — Other Ambulatory Visit: Payer: Self-pay | Admitting: Cardiology

## 2014-05-07 ENCOUNTER — Ambulatory Visit (INDEPENDENT_AMBULATORY_CARE_PROVIDER_SITE_OTHER): Payer: Medicare Other | Admitting: Emergency Medicine

## 2014-05-07 ENCOUNTER — Encounter: Payer: Self-pay | Admitting: Emergency Medicine

## 2014-05-07 VITALS — BP 150/82 | HR 92 | Temp 97.4°F | Ht 69.0 in | Wt 194.4 lb

## 2014-05-07 DIAGNOSIS — G4733 Obstructive sleep apnea (adult) (pediatric): Secondary | ICD-10-CM

## 2014-05-07 DIAGNOSIS — Z23 Encounter for immunization: Secondary | ICD-10-CM

## 2014-05-07 DIAGNOSIS — J449 Chronic obstructive pulmonary disease, unspecified: Secondary | ICD-10-CM

## 2014-05-07 DIAGNOSIS — I208 Other forms of angina pectoris: Secondary | ICD-10-CM

## 2014-05-07 MED ORDER — ALBUTEROL SULFATE HFA 108 (90 BASE) MCG/ACT IN AERS: 2.0000 | INHALATION_SPRAY | RESPIRATORY_TRACT | Status: DC | PRN

## 2014-05-07 MED ORDER — LOSARTAN POTASSIUM 50 MG PO TABS: 50.0000 mg | ORAL_TABLET | Freq: Every day | ORAL | Status: DC

## 2014-05-07 NOTE — Progress Notes (Signed)
Oscar Robinson is a 68 year old gentleman with known coronary artery disease status post CABG. Also has a history of significant tobacco abuse and airflow limitation on pulmonary function testing. He has a positive bronchodilator response.   ROV 05/23/10 -- follows up today for his COPD, dyspnea. He is doing an exercise program. 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 No wheeze or cough during the day. He does cough at night when he lays flat. No real wt gain. No CP. Occas ankle swelling esp when he eats salt.   ROV 09/17/11 -- follows up today for his COPD, dyspnea. Last seen 05/2010. Has been doing fairly well, was exposed to URI about 3 weeks ago, sore throat, congestion that progressed to dyspnea. Was rx with azithro and has improved, cough and breathing both better. Did not receive steroids. Feels back to baseline. He is not very reliable with symbicort because it sometimes gives him sore throat. Tells me also that sometimes he skips the Spiriva and takes the Symbicort (seems to help his breathing more).    ROV 03/18/12 --  COPD, dyspnea.  He has been doing fairly well, is having congestion at night, doesn't wake him from sleep. Continues to take Symbicort and Spiriva, sometimes skips the pm dose Symbicort. Uses SABA very rarely. He snores and has had witnessed apneas by his wife.   ROV 09/16/12 -- COPD, dyspnea, BD responsiveness. Presents today noting that he developed cough, congestion, malaise about 10 days ago. PCP did flu swab > negative. Was started on pred taper a week ago - seemed to help him, then was exposed to person with URI over the last weekend. He is now having more cough again, chest pain from the cough. The phlegm is yellow to pink, no frank blood.    ROV 11/17/12 -- COPD, dyspnea, BD responsiveness.  F/u after URI's as above, AE-COPD. We extended his pred taper and treated w doxy >>  feels much better, probably back to baseline. He is on spiriva and symbicort. 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 He is coughing rarely, has some mucous production esp when he is supine at night.   ROV 05/01/13 -- COPD, dyspnea, BD responsiveness. He has a hx CAD/CABG in '99. Returns for f/u. He is noticing more exertional SOB over the last several months. No new cough, wheeze or adjunct symptoms. Denies CP or angina. 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 He has gained 5-10 lbs. Spiriva + symbicort are his maintenance. He has a cardiologist in [place], but not sure he want to follow there.   ROV 06/16/13 -- COPD, dyspnea, BD responsiveness. He has a hx CAD/CABG in '99.  He met and liked Dr Marlou Porch, has undergone nuclear stress and now L cath. Trial of breo seems to have been beneficial, but he isn't sure it's a lot better than symbicort.   ROV 11/03/13 -- COPD, dyspnea, BD responsiveness, CAD/CABG. He continues to have dyspnea with exertion about the same as before, not better for sure. Most noticeable with walking an incline. Remains on symbicort and spiriva but only taking the symbicort once a day. He cannot tolerate smoke, fumes. Is sensitive to pollen, dust.   Acute OV 03/09/14 -- hx of COPD and BD responsiveness, exertional dyspnea. He was evaluated by Dr Marlou Porch since last visit > his cath was reassuring, argued against his dyspnea being related  to coronary disease. He is still having exertional SOB, difficulty w tasks. This is probably worse over the last year. He is on symbicort and spiriva.   ROV 04/07/14 -- followup visit for COPD with asthmatic features, exertional dyspnea. He has been managed with Symbicort and Spiriva. He has been more dyspneic, planned to get repeat PFT as below.   Spirometry 01/07/07 - severe airflow limitation with an FEV1 of 1.56 L or 49% of predicted with a positive bronchodilator response. Repeat PFT 04/07/14 showed very severe airflow limitation with an FEV1 of 1.28 L or 49%  dictated that improved to 1.60 L or 49% predicted after bronchodilator. His lungs are hyperinflated and his diffusion capacity is decreased.   ROV 05/07/14 -- followup visit for COPD with asthmatic features, exertional dyspnea. He did a try of Anoro + Arnuity to see if they were better. May have been some improvement but not a large difference.  He is still feeling limited "can't do what I used to". He tells me that he could only carry a 4x4 50 ft without stopping. His voice is hoarse (on ramipril).   EXAM:  Filed Vitals:   05/07/14 1601  BP: 150/82  Pulse: 92  Temp: 97.4 F (36.3 C)  TempSrc: Oral  Height: _0  (1.753 m)  Weight: 194 lb 6.4 oz (88.179 kg)  SpO2: 98%   Gen: Pleasant, well-nourished, in no distress,  normal affect  ENT: No lesions,  mouth clear,  oropharynx clear, no postnasal drip  Neck: No JVD, no TMG, no carotid bruits  Lungs: No use of accessory muscles, no wheezes  Cardiovascular: RRR, heart sounds normal, no murmur or gallops, no peripheral edema  Musculoskeletal: No deformities, no cyanosis or clubbing  Neuro: alert, non focal  Skin: Warm, no lesions or rashes   COPD (chronic obstructive pulmonary disease) Go back to your Spiriva daily and Symbicort twice a day We will start albuterol 2 puffs up to every four hours if needed for shortness of breath.  We will stop ramipril Start losartan 33m daily.  Follow with Dr BLamonte Sakaiin 3 months so we can review your sleep study, or sooner if you have any problems.  Obstructive sleep apnea Sleep study soon, follow up to review.

## 2014-05-07 NOTE — Assessment & Plan Note (Signed)
Go back to your Spiriva daily and Symbicort twice a day We will start albuterol 2 puffs up to every four hours if needed for shortness of breath.  We will stop ramipril Start losartan 50mg  daily.  Follow with Dr [name] in 3 months so we can review your sleep study, or sooner if you have any problems.

## 2014-05-07 NOTE — Patient Instructions (Signed)
Go back to your Spiriva daily and Symbicort twice a day We will start albuterol 2 puffs up to every four hours if needed for shortness of breath.  Get your sleep study as planned.  We will stop ramipril Start losartan 50mg  daily.  Follow with Dr [name] in 3 months so we can review your sleep study, or sooner if you have any problems.

## 2014-05-07 NOTE — Assessment & Plan Note (Signed)
Sleep study soon, follow up to review.

## 2014-05-07 NOTE — Addendum Note (Signed)
Addended by: Mathis Dad on: 05/07/2014 04:51 PM   Modules accepted: Orders

## 2014-05-10 ENCOUNTER — Ambulatory Visit (HOSPITAL_BASED_OUTPATIENT_CLINIC_OR_DEPARTMENT_OTHER): Payer: Medicare Other | Attending: Pulmonary Disease | Admitting: Radiology

## 2014-05-10 VITALS — Ht 70.0 in | Wt 190.0 lb

## 2014-05-10 DIAGNOSIS — I2510 Atherosclerotic heart disease of native coronary artery without angina pectoris: Secondary | ICD-10-CM | POA: Diagnosis not present

## 2014-05-10 DIAGNOSIS — G479 Sleep disorder, unspecified: Secondary | ICD-10-CM | POA: Diagnosis not present

## 2014-05-10 DIAGNOSIS — R0683 Snoring: Secondary | ICD-10-CM | POA: Insufficient documentation

## 2014-05-10 DIAGNOSIS — J449 Chronic obstructive pulmonary disease, unspecified: Secondary | ICD-10-CM | POA: Insufficient documentation

## 2014-05-10 DIAGNOSIS — G4710 Hypersomnia, unspecified: Secondary | ICD-10-CM | POA: Diagnosis not present

## 2014-05-10 DIAGNOSIS — G4761 Periodic limb movement disorder: Secondary | ICD-10-CM | POA: Insufficient documentation

## 2014-05-10 DIAGNOSIS — G4733 Obstructive sleep apnea (adult) (pediatric): Secondary | ICD-10-CM

## 2014-05-10 DIAGNOSIS — E119 Type 2 diabetes mellitus without complications: Secondary | ICD-10-CM | POA: Diagnosis not present

## 2014-05-10 DIAGNOSIS — G4730 Sleep apnea, unspecified: Secondary | ICD-10-CM | POA: Diagnosis present

## 2014-05-11 ENCOUNTER — Other Ambulatory Visit: Payer: Self-pay | Admitting: *Deleted

## 2014-05-11 MED ORDER — ALBUTEROL SULFATE HFA 108 (90 BASE) MCG/ACT IN AERS: 2.0000 | INHALATION_SPRAY | RESPIRATORY_TRACT | Status: DC | PRN

## 2014-05-18 DIAGNOSIS — G4710 Hypersomnia, unspecified: Secondary | ICD-10-CM

## 2014-05-18 NOTE — Sleep Study (Signed)
[place]  NAME: Oscar Robinson DATE OF BIRTH:  12-03-1945 MEDICAL RECORD NUMBER [id number]  LOCATION: [facility]  PHYSICIAN: Chesley Mires, M.D. DATE OF STUDY: 05/10/2014  SLEEP STUDY TYPE: Polysomnogram               REFERRING PHYSICIAN: Collene Gobble, MD  INDICATION FOR STUDY:  Oscar Robinson is a 68 y.o. male who presents to the sleep lab for evaluation of hypersomnia with obstructive sleep apnea.  He reports snoring, sleep disruption, apnea, and daytime sleepiness.  He has a history of COPD, CAD, and DM.  EPWORTH SLEEPINESS SCORE: 2. HEIGHT: 5\' 10"  (177.8 cm)  WEIGHT: 190 lb (86.183 kg)    Body mass index is 27.26 kg/(m^2).  NECK SIZE: 15 in.  MEDICATIONS:  Current Outpatient Prescriptions on File Prior to Visit  Medication Sig Dispense Refill  . ALPRAZolam (XANAX) 0.5 MG tablet Take 0.25 mg by mouth at bedtime.      Marland Kitchen amLODipine (NORVASC) 5 MG tablet Take 1 tablet (5 mg total) by mouth daily.  30 tablet  5  . aspirin 81 MG tablet Take 162 mg by mouth daily.       . Coenzyme Q10 (CO Q 10) 100 MG CAPS Take 100 mg by mouth daily.      . colesevelam (WELCHOL) 625 MG tablet Take 3 tablets (1,875 mg total) by mouth 2 (two) times daily with a meal.  360 tablet  3  . doxepin (SINEQUAN) 50 MG capsule Take 100 mg by mouth daily.      Marland Kitchen esomeprazole (NEXIUM) 40 MG capsule Take 1 capsule (40 mg total) by mouth daily at 12 noon.  30 capsule  6  . insulin glargine (LANTUS) 100 UNIT/ML injection Inject 25 Units into the skin daily.       . insulin lispro (HUMALOG KWIKPEN) 100 UNIT/ML SOPN Inject 8-15 Units into the skin 3 (three) times daily.      Marland Kitchen losartan (COZAAR) 50 MG tablet Take 1 tablet (50 mg total) by mouth daily.  30 tablet  2  . meloxicam (MOBIC) 15 MG tablet Take 15 mg by mouth daily.      . metFORMIN (GLUCOPHAGE) 500 MG tablet Take 500 mg by mouth 2 (two) times daily.       . metoprolol succinate (TOPROL-XL) 50 MG 24 hr tablet Take  1 tablet (50 mg total) by mouth daily. Take with or immediately following a meal.  60 tablet  6  . Multiple Vitamin (MULTIVITAMIN) capsule Take 1 capsule by mouth daily.      . nitroGLYCERIN (NITROSTAT) 0.4 MG SL tablet Place 1 tablet (0.4 mg total) under the tongue every 5 (five) minutes as needed for chest pain.  30 tablet  5  . Omega-3 Fatty Acids (FISH OIL) 1200 MG CAPS Take 1 capsule by mouth 2 (two) times daily.      . pravastatin (PRAVACHOL) 40 MG tablet TAKE ONE TABLET BY MOUTH ONCE DAILY  30 tablet  0  . ramipril (ALTACE) 10 MG capsule Take 1 capsule (10 mg total) by mouth daily.  30 capsule  6  . SPIRIVA HANDIHALER 18 MCG inhalation capsule INHALE ONE DOSE BY MOUTH ONCE DAILY  30 capsule  6  . SYMBICORT 160-4.5 MCG/ACT inhaler 2 puffs 2 (two) times daily.       Current Facility-Administered Medications on File Prior to Visit  Medication Dose Route Frequency Provider Last Rate Last Dose  . 0.9 %  sodium chloride infusion   Intravenous Continuous Candee Furbish, MD        SLEEP ARCHITECTURE:  Total recording time: 415.5 minutes.  Total sleep time was: 242.5 minutes.  Sleep efficiency: 58.4%.  Sleep latency: 11 minutes.  REM latency: 265 minutes.  Stage N1: 17.5%.  Stage N2: 76.1%.  Stage N3: 0%.  Stage R:  6.4%.  Supine sleep: 6 minutes.  Non-supine sleep: 236.5 minutes.  CARDIAC DATA:  Average heart rate: 75 beats per minute. Rhythm strip: sinus rhythm with occasional PAC's.  RESPIRATORY DATA: Average respiratory rate: 15. Snoring: moderate. Average AHI: 2.   Apnea index: 0.  Hypopnea index: 2. Obstructive apnea index: 0.  Central apnea index: 0.  Mixed apnea index: 0. REM AHI: 31.  NREM AHI: 0. Supine AHI: 0. Non-supine AHI: 1.3.  MOVEMENT/PARASOMNIA:  Periodic limb movement: 26.7.  Period limb movements with arousals: 1. Restroom trips: two.  OXYGEN DATA:  Baseline oxygenation: 96%. Lowest SaO2: 87%. Time spent below SaO2 90%: 1.3 minutes. Supplemental oxygen used:  none.  IMPRESSION/ RECOMMENDATION:   While his overall AHI was in normal range, he did appear to have difficulty with sleep disordered breathing during REM sleep.  However, this was not sufficient enough to qualify him for a diagnosis of sleep apnea.  If there is strong suspicion for presence of sleep apnea, then he could return to the sleep lab for a repeat study.  He had an increase in his periodic limb movement index, and clinical correlation would be needed to determine the significance of this.   Chesley Mires, M.D. Diplomate, [employer] of Sleep Medicine  ELECTRONICALLY SIGNED ON:  05/18/2014, 3:27 PM [place] PH: [phone]   FX: [phone] [place]

## 2014-05-21 ENCOUNTER — Other Ambulatory Visit: Payer: Self-pay

## 2014-05-31 ENCOUNTER — Other Ambulatory Visit: Payer: Self-pay | Admitting: Cardiology

## 2014-06-20 ENCOUNTER — Other Ambulatory Visit: Payer: Self-pay | Admitting: Cardiology

## 2014-07-02 ENCOUNTER — Other Ambulatory Visit: Payer: Self-pay | Admitting: Cardiology

## 2014-07-15 ENCOUNTER — Encounter (HOSPITAL_COMMUNITY): Payer: Self-pay | Admitting: Cardiology

## 2014-07-21 ENCOUNTER — Other Ambulatory Visit: Payer: Self-pay | Admitting: Endocrinology

## 2014-07-21 DIAGNOSIS — E041 Nontoxic single thyroid nodule: Secondary | ICD-10-CM

## 2014-07-22 ENCOUNTER — Ambulatory Visit
Admission: RE | Admit: 2014-07-22 | Discharge: 2014-07-22 | Disposition: A | Discharge status: Home / Self Care | Payer: Medicare Other | Source: Ambulatory Visit | Attending: Endocrinology | Admitting: Endocrinology

## 2014-07-22 ENCOUNTER — Other Ambulatory Visit: Payer: Self-pay | Admitting: Cardiology

## 2014-07-22 DIAGNOSIS — E041 Nontoxic single thyroid nodule: Secondary | ICD-10-CM

## 2014-07-26 ENCOUNTER — Other Ambulatory Visit: Payer: Self-pay | Admitting: Endocrinology

## 2014-07-26 DIAGNOSIS — E041 Nontoxic single thyroid nodule: Secondary | ICD-10-CM

## 2014-07-29 ENCOUNTER — Other Ambulatory Visit: Payer: Self-pay | Admitting: Cardiology

## 2014-08-12 ENCOUNTER — Other Ambulatory Visit (HOSPITAL_COMMUNITY)
Admission: RE | Admit: 2014-08-12 | Discharge: 2014-08-12 | Disposition: A | Discharge status: Home / Self Care | Payer: Medicare Other | Source: Ambulatory Visit | Attending: Interventional Radiology | Admitting: Interventional Radiology

## 2014-08-12 ENCOUNTER — Ambulatory Visit
Admission: RE | Admit: 2014-08-12 | Discharge: 2014-08-12 | Disposition: A | Discharge status: Home / Self Care | Payer: Medicare Other | Source: Ambulatory Visit | Attending: Endocrinology | Admitting: Endocrinology

## 2014-08-12 DIAGNOSIS — E041 Nontoxic single thyroid nodule: Secondary | ICD-10-CM | POA: Insufficient documentation

## 2014-08-21 ENCOUNTER — Other Ambulatory Visit: Payer: Self-pay | Admitting: Cardiology

## 2014-08-30 ENCOUNTER — Other Ambulatory Visit: Payer: Self-pay | Admitting: Cardiology

## 2014-09-13 ENCOUNTER — Ambulatory Visit (INDEPENDENT_AMBULATORY_CARE_PROVIDER_SITE_OTHER): Payer: Medicare Other | Admitting: Emergency Medicine

## 2014-09-13 ENCOUNTER — Encounter: Payer: Self-pay | Admitting: Emergency Medicine

## 2014-09-13 VITALS — BP 140/74 | HR 73 | Ht 69.0 in | Wt 194.0 lb

## 2014-09-13 DIAGNOSIS — J449 Chronic obstructive pulmonary disease, unspecified: Secondary | ICD-10-CM

## 2014-09-13 DIAGNOSIS — G4733 Obstructive sleep apnea (adult) (pediatric): Secondary | ICD-10-CM

## 2014-09-13 NOTE — Assessment & Plan Note (Signed)
Sleep study did not show enough events to qualify for obstructive sleep apnea, he did have documented obstructive events when he was in REM sleep. We will consider a repeat sleep study at some point in the future.

## 2014-09-13 NOTE — Progress Notes (Signed)
Oscar Robinson is a 69 year old gentleman with known coronary artery disease status post CABG. Also has a history of significant tobacco abuse and airflow limitation on pulmonary function testing. He has a positive bronchodilator response.   ROV 05/23/10 -- follows up today for his COPD, dyspnea. He is doing an exercise program. 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 No wheeze or cough during the day. He does cough at night when he lays flat. No real wt gain. No CP. Occas ankle swelling esp when he eats salt.   ROV 09/17/11 -- follows up today for his COPD, dyspnea. Last seen 05/2010. Has been doing fairly well, was exposed to URI about 3 weeks ago, sore throat, congestion that progressed to dyspnea. Was rx with azithro and has improved, cough and breathing both better. Did not receive steroids. Feels back to baseline. He is not very reliable with symbicort because it sometimes gives him sore throat. Tells me also that sometimes he skips the Spiriva and takes the Symbicort (seems to help his breathing more).    ROV 03/18/12 --  COPD, dyspnea.  He has been doing fairly well, is having congestion at night, doesn't wake him from sleep. Continues to take Symbicort and Spiriva, sometimes skips the pm dose Symbicort. Uses SABA very rarely. He snores and has had witnessed apneas by his wife.   ROV 09/16/12 -- COPD, dyspnea, BD responsiveness. Presents today noting that he developed cough, congestion, malaise about 10 days ago. PCP did flu swab > negative. Was started on pred taper a week ago - seemed to help him, then was exposed to person with URI over the last weekend. He is now having more cough again, chest pain from the cough. The phlegm is yellow to pink, no frank blood.    ROV 11/17/12 -- COPD, dyspnea, BD responsiveness.  F/u after URI's as above, AE-COPD. We extended his pred taper and treated w doxy >>  feels much better, probably back to baseline. He is on spiriva and symbicort. 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 He is coughing rarely, has some mucous production esp when he is supine at night.   ROV 05/01/13 -- COPD, dyspnea, BD responsiveness. He has a hx CAD/CABG in '99. Returns for f/u. He is noticing more exertional SOB over the last several months. No new cough, wheeze or adjunct symptoms. Denies CP or angina. 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 He has gained 5-10 lbs. Spiriva + symbicort are his maintenance. He has a cardiologist in [place], but not sure he want to follow there.   ROV 06/16/13 -- COPD, dyspnea, BD responsiveness. He has a hx CAD/CABG in '99.  He met and liked Dr Marlou Porch, has undergone nuclear stress and now L cath. Trial of breo seems to have been beneficial, but he isn't sure it's a lot better than symbicort.   ROV 11/03/13 -- COPD, dyspnea, BD responsiveness, CAD/CABG. He continues to have dyspnea with exertion about the same as before, not better for sure. Most noticeable with walking an incline. Remains on symbicort and spiriva but only taking the symbicort once a day. He cannot tolerate smoke, fumes. Is sensitive to pollen, dust.   Acute OV 03/09/14 -- hx of COPD and BD responsiveness, exertional dyspnea. He was evaluated by Dr Marlou Porch since last visit > his cath was reassuring, argued against his dyspnea being related  to coronary disease. He is still having exertional SOB, difficulty w tasks. This is probably worse over the last year. He is on symbicort and spiriva.   ROV 04/07/14 -- followup visit for COPD with asthmatic features, exertional dyspnea. He has been managed with Symbicort and Spiriva. He has been more dyspneic, planned to get repeat PFT as below.   Spirometry 01/07/07 - severe airflow limitation with an FEV1 of 1.56 L or 49% of predicted with a positive bronchodilator response. Repeat PFT 04/07/14 showed very severe airflow limitation with an FEV1 of 1.28 L or 49%  dictated that improved to 1.60 L or 49% predicted after bronchodilator. His lungs are hyperinflated and his diffusion capacity is decreased.   ROV 05/07/14 -- followup visit for COPD with asthmatic features, exertional dyspnea. He did a try of Anoro + Arnuity to see if they were better. May have been some improvement but not a large difference.  He is still feeling limited "can't do what I used to". He tells me that he could only carry a 4x4 50 ft without stopping. His voice is hoarse (on ramipril).   ROV 09/13/14 -- follow-up visit for COPD with a positive bronchodilator response, exertional shortness of breath. Last time we changed him back to Spiriva and Symbicort. We also stopped his ramipril and changed it to losartan.  He had a sleep study done in October that showed evidence for sleep apnea while he was in REM sleep but overall not enough to qualify for official diagnosis of OSA. He is doing a treadmill and an elliptical trainer. Still quite bothered that he can't do the activity that he used to do. He does have a hx of CABG, ? Whether this may limit his exertion. He had a cath end of 2014 that was reassuring.                                                                                                                                       EXAM:  Filed Vitals:   09/13/14 1645  BP: 140/74  Pulse: 73  Height: $Remove'5\' 9"'[id number]$  (1.753 m)  Weight: 194 lb (87.998 kg)  SpO2: 100%   Gen: Pleasant, well-nourished, in no distress,  normal affect  ENT: No lesions,  mouth clear,  oropharynx clear, no postnasal drip  Neck: No JVD, no TMG, no carotid bruits  Lungs: No use of accessory muscles, no wheezes  Cardiovascular: RRR, heart sounds normal, no murmur or gallops, no peripheral edema  Musculoskeletal: No deformities, no cyanosis or clubbing  Neuro: alert, non focal  Skin: Warm, no lesions or rashes   COPD (chronic obstructive pulmonary disease) He initially continues to be very functional on  Spiriva and Symbicort. He is active but he makes note of the fact that he cannot do what he used to be able to do several years ago. I believe that he is actually stable but that he  is concerned about the new limitations and been placed on his exercise tolerance by his lung disease. We have talked about this before and in fact he underwent cardiac catheterization in 2014.   - We will continue his current inhaled medications - Walking oximetry today - Consider pulmonary rehabilitation at some point in the future - ROV 6   Obstructive sleep apnea Sleep study did not show enough events to qualify for obstructive sleep apnea, he did have documented obstructive events when he was in REM sleep. We will consider a repeat sleep study at some point in the future.

## 2014-09-13 NOTE — Assessment & Plan Note (Signed)
He initially continues to be very functional on Spiriva and Symbicort. He is active but he makes note of the fact that he cannot do what he used to be able to do several years ago. I believe that he is actually stable but that he is concerned about the new limitations and been placed on his exercise tolerance by his lung disease. We have talked about this before and in fact he underwent cardiac catheterization in 2014.   - We will continue his current inhaled medications - Walking oximetry today - Consider pulmonary rehabilitation at some point in the future - ROV 6

## 2014-09-13 NOTE — Patient Instructions (Signed)
Please continue your current medications Walking oximetry today Follow with Dr [name] in 6 months or sooner if you have any problems

## 2014-09-28 ENCOUNTER — Other Ambulatory Visit: Payer: Self-pay | Admitting: Cardiology

## 2014-10-14 ENCOUNTER — Other Ambulatory Visit: Payer: Self-pay | Admitting: Cardiology

## 2014-10-18 ENCOUNTER — Other Ambulatory Visit: Payer: Self-pay | Admitting: Cardiology

## 2014-10-19 ENCOUNTER — Other Ambulatory Visit: Payer: Self-pay | Admitting: *Deleted

## 2014-10-19 MED ORDER — PRAVASTATIN SODIUM 40 MG PO TABS: 40.0000 mg | ORAL_TABLET | Freq: Every day | ORAL | Status: DC

## 2014-11-03 ENCOUNTER — Other Ambulatory Visit: Payer: Self-pay | Admitting: *Deleted

## 2014-11-03 MED ORDER — METOPROLOL SUCCINATE ER 50 MG PO TB24: 50.0000 mg | ORAL_TABLET | Freq: Every day | ORAL | Status: DC

## 2014-11-16 ENCOUNTER — Telehealth: Payer: Self-pay | Admitting: Cardiology

## 2014-11-16 NOTE — Telephone Encounter (Signed)
Pt aware orders will be placed at the time of his appointment tomorrow.

## 2014-11-16 NOTE — Telephone Encounter (Signed)
New Message       Pt calling wanting to have labs to check his cholesterol for tomorrow and there is no order in Epic. Please call back and advise.

## 2014-11-17 ENCOUNTER — Encounter: Payer: Self-pay | Admitting: Cardiology

## 2014-11-17 ENCOUNTER — Ambulatory Visit (INDEPENDENT_AMBULATORY_CARE_PROVIDER_SITE_OTHER): Payer: Medicare Other | Admitting: Cardiology

## 2014-11-17 VITALS — BP 128/74 | HR 84 | Ht 69.0 in | Wt 189.1 lb

## 2014-11-17 DIAGNOSIS — I2510 Atherosclerotic heart disease of native coronary artery without angina pectoris: Secondary | ICD-10-CM

## 2014-11-17 DIAGNOSIS — J438 Other emphysema: Secondary | ICD-10-CM

## 2014-11-17 DIAGNOSIS — E7800 Pure hypercholesterolemia, unspecified: Secondary | ICD-10-CM

## 2014-11-17 DIAGNOSIS — R0600 Dyspnea, unspecified: Secondary | ICD-10-CM | POA: Diagnosis not present

## 2014-11-17 DIAGNOSIS — Z87891 Personal history of nicotine dependence: Secondary | ICD-10-CM

## 2014-11-17 NOTE — Patient Instructions (Signed)
Medication Instructions:  Your physician recommends that you continue on your current medications as directed. Please refer to the Current Medication list given to you today.  Follow-Up: Your physician wants you to follow-up in: 6 months. You will receive a reminder letter in the mail two months in advance. If you don't receive a letter, please call our office to schedule the follow-up appointment.

## 2014-11-17 NOTE — Progress Notes (Signed)
[facility]. [address], [address] [place], [place]  [address] Phone: [phone] Fax:  [phone]  Date:  11/17/2014   ID:  Oscar Robinson, DOB [birth year], MRN [id number]  PCP:  [name], MD   History of Present Illness: Oscar Robinson is a 69 y.o. male with coronary artery disease status post bypass surgery 1999, significant tobacco use, COPD with recent increasing shortness of breath, dyspnea on exertion here at the request of [name] for further evaluation.  Dyspnea with exertional with no chest pain, currently moderate in severity, relieved with rest with no obvious exacerbating factors, non-positional. No palpitations. Prior to CABG had symptoms of dyspnea mostly. Fatigue.   Last stress test was about 2 years ago he thinks in [place] at [facility]. [name] it was OK.   06/11/13 he underwent nuclear stress test where he had marked ST segment depression diffusely during exercise, relieved with rest as well as marked shortness of breath, pale. Blood pressure also decreased during exercise.  07/10/13 -native vessel LAD/diagonal bifurcation disease with patent LIMA to LAD/diagonal albeit small caliber vessel. Normal perfusion pattern on nuclear stress test. My concern previously was ST segment depression as well as decrease in blood pressure during exercise. This is described above. Ejection fraction 60%. We decided to continue with medical management. Metoprolol, amlodipine, pravastatin. No PCI.  11/18/13-he has been having some complaints of dysphagia, food getting stuck in his upper throat. One of his friends died of esophageal cancer. He is a former smoker. He still having shortness of breath. Getting treated for COPD by [name]. Prior cardiac catheterization reviewed with him.   11/17/14- still having dyspnea on exertion. No significant changes however over the past year. 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 He is quite active. In review of [name] note, consideration to  pulmonary rehabilitation in future. Prior cardiac catheterization reviewed once again.  Wt Readings from Last 3 Encounters:  11/17/14 189 lb 1.9 oz (85.784 kg)  09/13/14 194 lb (87.998 kg)  05/10/14 190 lb (86.183 kg)     Past Medical History  Diagnosis Date  . GERD (gastroesophageal reflux disease)   . Hypercholesteremia   . COPD (chronic obstructive pulmonary disease)   . Diabetes mellitus   . Tobacco abuse   . Coronary artery disease   . Anxiety   . Depression   . Arthritis     Past Surgical History  Procedure Laterality Date  . Coronary artery bypass graft  1999  . Hand surgery      lt palm,ring finger,  . Hernia repair  1996    lt/rt ing hernia  . Dupuytren contracture release  04/10/2012    Procedure: DUPUYTREN CONTRACTURE RELEASE;  Surgeon: Cammie Sickle., MD;  Location: [facility];  Service: Orthopedics;  Laterality: Right;  palm, ring and small fingers dupuytrens contracture release  . Left heart catheterization with coronary/graft angiogram N/A 06/15/2013    Procedure: LEFT HEART CATHETERIZATION WITH Beatrix Fetters;  Surgeon: Candee Furbish, MD;  Location: [facility];  Service: Cardiovascular;  Laterality: N/A;    Current Outpatient Prescriptions  Medication Sig Dispense Refill  . albuterol (PROAIR HFA) 108 (90 BASE) MCG/ACT inhaler Inhale 2 puffs into the lungs every 4 (four) hours as needed for wheezing or shortness of breath. 1 Inhaler 3  . ALPRAZolam (XANAX) 0.5 MG tablet Take 0.25 mg by mouth at bedtime.    Marland Kitchen amLODipine (NORVASC) 5 MG tablet TAKE ONE TABLET BY  MOUTH ONCE DAILY 30 tablet 2  . aspirin 81 MG tablet Take 162 mg by mouth daily.     . Coenzyme Q10 (CO Q 10) 100 MG CAPS Take 100 mg by mouth daily.    . colesevelam (WELCHOL) 625 MG tablet Take 3 tablets (1,875 mg total) by mouth 2 (two) times daily with a meal. 360 tablet 3  . doxepin (SINEQUAN) 50 MG capsule Take 100 mg by mouth daily.    Marland Kitchen esomeprazole (NEXIUM) 40 MG  capsule Take 1 capsule (40 mg total) by mouth daily at 12 noon. 30 capsule 6  . insulin glargine (LANTUS) 100 UNIT/ML injection Inject 25 Units into the skin daily.     . insulin lispro (HUMALOG KWIKPEN) 100 UNIT/ML SOPN Inject 8-15 Units into the skin 3 (three) times daily.    . metFORMIN (GLUCOPHAGE) 500 MG tablet Take 500 mg by mouth 2 (two) times daily.     . metoprolol succinate (TOPROL-XL) 50 MG 24 hr tablet Take 1 tablet (50 mg total) by mouth daily. Take with or immediately following a meal. 30 tablet 0  . Multiple Vitamin (MULTIVITAMIN) capsule Take 1 capsule by mouth daily.    . nitroGLYCERIN (NITROSTAT) 0.4 MG SL tablet Place 1 tablet (0.4 mg total) under the tongue every 5 (five) minutes as needed for chest pain. 30 tablet 5  . Omega-3 Fatty Acids (FISH OIL) 1200 MG CAPS Take 1 capsule by mouth 2 (two) times daily.    . pravastatin (PRAVACHOL) 40 MG tablet Take 1 tablet (40 mg total) by mouth daily. 30 tablet 0  . ramipril (ALTACE) 10 MG capsule Take 1 capsule (10 mg total) by mouth daily. 30 capsule 6  . SPIRIVA HANDIHALER 18 MCG inhalation capsule INHALE ONE DOSE BY MOUTH ONCE DAILY 30 capsule 6  . SYMBICORT 160-4.5 MCG/ACT inhaler 2 puffs 2 (two) times daily.     Current Facility-Administered Medications  Medication Dose Route Frequency Provider Last Rate Last Dose  . 0.9 %  sodium chloride infusion   Intravenous Continuous Jerline Pain, MD        Allergies:   No Known Allergies  Social History:  The patient  reports that he quit smoking about 17 years ago. His smoking use included Cigarettes. He has a 70 pack-year smoking history. He has never used smokeless tobacco. He reports that he drinks alcohol. He reports that he does not use illicit drugs.   Family History  Problem Relation Age of Onset  . Drug abuse Brother     Cocaine  . Heart disease Neg Hx   . Heart failure Neg Hx   . Diabetes Neg Hx   . Hypertension Neg Hx   . Colon cancer Neg Hx   . Esophageal cancer Neg  Hx   . Prostate cancer Neg Hx   . Pancreatic cancer Neg Hx   . Kidney disease Neg Hx   . Liver disease Neg Hx     ROS:  Please see the history of present illness. Denies any bleeding, syncope, stroke like symptoms, dysphagia, orthopnea, PND. No rashes or   All other systems reviewed and negative.   PHYSICAL EXAM: VS:  BP 128/74 mmHg  Pulse 84  Ht 5\' 9"  (1.753 m)  Wt 189 lb 1.9 oz (85.784 kg)  BMI 27.92 kg/m2 Well nourished, well developed, in no acute distress HEENT: normal, [place]/AT, EOMI Neck: no JVD, normal carotid upstroke, no bruit Cardiac:  normal S1, S2; RRR; no murmur Lungs:  clear to  auscultation bilaterally, no wheezing, rhonchi or rales although he does have prolonged expiratory phase/distant breath sounds noted Abd: soft, nontender, no hepatomegaly, no bruits Ext: no edema, 2+ distal pulses Skin: warm and dry GU: deferred Neuro: no focal abnormalities noted, AAO x 3, mildly anxious  EKG:     11/17/14-normal rhythm, no other abnormalities, very subtle nonspecific T-wave changes in aVL.sinus rhythm, 73 with nonspecific ST-T wave changes     ASSESSMENT AND PLAN:  1. Shortness of breath-dyspnea on exertion was a previous anginal equivalent for him prior to his bypass surgery in 1999. Cardiac catheterization as above. Reassuring. It is likely that his underlying COPD as driving the majority of his dyspnea. (49% FEV1) Continue with medical management. Continue with current aggressive secondary prevention. Thankfully, nuclear stress perfusion did not show any significant defects. 2. COPD-[name]. Medications reviewed.  3. Hypertension-stable. Medications reviewed 4. Angina-nitroglycerin refilled. 5. Coronary artery disease-as described above-prior bypass 1999 6.  hyperlipidemia-excellent lipids. Multidrug regimen noted as above. 7. Diabetes-insulin-dependent, primary physician. 8.  we will see him back in 6 months   Signed, Candee Furbish, MD [facility]  11/17/2014 3:05 PM

## 2014-11-18 ENCOUNTER — Other Ambulatory Visit: Payer: Self-pay | Admitting: Cardiology

## 2014-11-21 ENCOUNTER — Other Ambulatory Visit: Payer: Self-pay | Admitting: Cardiology

## 2014-11-29 ENCOUNTER — Other Ambulatory Visit: Payer: Self-pay | Admitting: Cardiology

## 2014-11-29 MED ORDER — COLESEVELAM HCL 625 MG PO TABS: 1875.0000 mg | ORAL_TABLET | Freq: Two times a day (BID) | ORAL | Status: DC

## 2014-12-02 ENCOUNTER — Other Ambulatory Visit: Payer: Self-pay | Admitting: Emergency Medicine

## 2015-01-11 ENCOUNTER — Other Ambulatory Visit: Payer: Self-pay | Admitting: Cardiology

## 2015-01-20 ENCOUNTER — Other Ambulatory Visit: Payer: Self-pay | Admitting: Emergency Medicine

## 2015-01-31 ENCOUNTER — Other Ambulatory Visit: Payer: Self-pay

## 2015-02-02 ENCOUNTER — Other Ambulatory Visit: Payer: Self-pay | Admitting: Endocrinology

## 2015-02-02 DIAGNOSIS — E049 Nontoxic goiter, unspecified: Secondary | ICD-10-CM

## 2015-03-17 ENCOUNTER — Encounter: Payer: Self-pay | Admitting: Emergency Medicine

## 2015-03-17 ENCOUNTER — Ambulatory Visit (INDEPENDENT_AMBULATORY_CARE_PROVIDER_SITE_OTHER): Payer: Medicare Other | Admitting: Emergency Medicine

## 2015-03-17 VITALS — BP 120/66 | HR 79 | Ht 69.0 in | Wt 189.8 lb

## 2015-03-17 DIAGNOSIS — J438 Other emphysema: Secondary | ICD-10-CM | POA: Diagnosis not present

## 2015-03-17 DIAGNOSIS — I2510 Atherosclerotic heart disease of native coronary artery without angina pectoris: Secondary | ICD-10-CM | POA: Diagnosis not present

## 2015-03-17 DIAGNOSIS — G4733 Obstructive sleep apnea (adult) (pediatric): Secondary | ICD-10-CM | POA: Diagnosis not present

## 2015-03-17 DIAGNOSIS — R0600 Dyspnea, unspecified: Secondary | ICD-10-CM

## 2015-03-17 NOTE — Assessment & Plan Note (Signed)
Both his coronary disease and his COPD appear to be managed. I suspect that he is experiencing progressive exertional dyspnea due to slow loss of lung function with age. All the same like to perform a cardiopulmonary exercise test to see if either is pulmonary or cardiac disease is limiting his function. If so then we will pursue this further

## 2015-03-17 NOTE — Assessment & Plan Note (Signed)
Does not qualify for CPAP

## 2015-03-17 NOTE — Progress Notes (Signed)
Oscar Robinson is a 69 year old gentleman with known coronary artery disease status post CABG. Also has a history of significant tobacco abuse and airflow limitation on pulmonary function testing. He has a positive bronchodilator response.   ROV 05/23/10 -- follows up today for his COPD, dyspnea. He is doing an exercise program. 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 No wheeze or cough during the day. He does cough at night when he lays flat. No real wt gain. No CP. Occas ankle swelling esp when he eats salt.   ROV 09/17/11 -- follows up today for his COPD, dyspnea. Last seen 05/2010. Has been doing fairly well, was exposed to URI about 3 weeks ago, sore throat, congestion that progressed to dyspnea. Was rx with azithro and has improved, cough and breathing both better. Did not receive steroids. Feels back to baseline. He is not very reliable with symbicort because it sometimes gives him sore throat. Tells me also that sometimes he skips the Spiriva and takes the Symbicort (seems to help his breathing more).    ROV 03/18/12 --  COPD, dyspnea.  He has been doing fairly well, is having congestion at night, doesn't wake him from sleep. Continues to take Symbicort and Spiriva, sometimes skips the pm dose Symbicort. Uses SABA very rarely. He snores and has had witnessed apneas by his wife.   ROV 09/16/12 -- COPD, dyspnea, BD responsiveness. Presents today noting that he developed cough, congestion, malaise about 10 days ago. PCP did flu swab > negative. Was started on pred taper a week ago - seemed to help him, then was exposed to person with URI over the last weekend. He is now having more cough again, chest pain from the cough. The phlegm is yellow to pink, no frank blood.    ROV 11/17/12 -- COPD, dyspnea, BD responsiveness.  F/u after URI's as above, AE-COPD. We extended his pred taper and treated w doxy >>  feels much better, probably back to baseline. He is on spiriva and symbicort. 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 He is coughing rarely, has some mucous production esp when he is supine at night.   ROV 05/01/13 -- COPD, dyspnea, BD responsiveness. He has a hx CAD/CABG in '99. Returns for f/u. He is noticing more exertional SOB over the last several months. No new cough, wheeze or adjunct symptoms. Denies CP or angina. 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 He has gained 5-10 lbs. Spiriva + symbicort are his maintenance. He has a cardiologist in [place], but not sure he want to follow there.   ROV 06/16/13 -- COPD, dyspnea, BD responsiveness. He has a hx CAD/CABG in '99.  He met and liked Dr Marlou Porch, has undergone nuclear stress and now L cath. Trial of breo seems to have been beneficial, but he isn't sure it's a lot better than symbicort.   ROV 11/03/13 -- COPD, dyspnea, BD responsiveness, CAD/CABG. He continues to have dyspnea with exertion about the same as before, not better for sure. Most noticeable with walking an incline. Remains on symbicort and spiriva but only taking the symbicort once a day. He cannot tolerate smoke, fumes. Is sensitive to pollen, dust.   Acute OV 03/09/14 -- hx of COPD and BD responsiveness, exertional dyspnea. He was evaluated by Dr Marlou Porch since last visit > his cath was reassuring, argued against his dyspnea being related  to coronary disease. He is still having exertional SOB, difficulty w tasks. This is probably worse over the last year. He is on symbicort and spiriva.   ROV 04/07/14 -- followup visit for COPD with asthmatic features, exertional dyspnea. He has been managed with Symbicort and Spiriva. He has been more dyspneic, planned to get repeat PFT as below.   Spirometry 01/07/07 - severe airflow limitation with an FEV1 of 1.56 L or 49% of predicted with a positive bronchodilator response. Repeat PFT 04/07/14 showed very severe airflow limitation with an FEV1 of 1.28 L or 49%  dictated that improved to 1.60 L or 49% predicted after bronchodilator. His lungs are hyperinflated and his diffusion capacity is decreased.   ROV 05/07/14 -- followup visit for COPD with asthmatic features, exertional dyspnea. He did a try of Anoro + Arnuity to see if they were better. May have been some improvement but not a large difference.  He is still feeling limited "can't do what I used to". He tells me that he could only carry a 4x4 50 ft without stopping. His voice is hoarse (on ramipril).   ROV 09/13/14 -- follow-up visit for COPD with a positive bronchodilator response, exertional shortness of breath. Last time we changed him back to Spiriva and Symbicort. We also stopped his ramipril and changed it to losartan.  He had a sleep study done in October that showed evidence for sleep apnea while he was in REM sleep but overall not enough to qualify for official diagnosis of OSA. He is doing a treadmill and an elliptical trainer. Still quite bothered that he can't do the activity that he used to do. He does have a hx of CABG, ? Whether this may limit his exertion. He had a cath end of 2014 that was reassuring.      ROV 03/17/15 -- history of COPD and asthmatic component with a bronchodilator response.  Also history of mild sleep apnea (did not qualify for therapy).  He follows with Dr Marlou Porch and has had reassuring eval including cath in 2014. He continues to have exertional SOB, does not feel that he is able to do as much as he should be able to do. He has had to slow down. 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 He uses his Symbicort and Spiriva as prescribed.                                                      EXAM:  Filed Vitals:   03/17/15 1554  BP: 120/66  Pulse: 79  Height: $Remove'5\' 9"'jIjMnda$  (1.753 m)  Weight: 189 lb 12.8 oz (86.093 kg)  SpO2: 98%   Gen: Pleasant, well-nourished, in no distress,  normal affect  ENT: No  lesions,  mouth clear,  oropharynx clear, no postnasal drip  Neck: No JVD, no TMG, no carotid bruits  Lungs: No use of accessory muscles, no wheezes  Cardiovascular: RRR, heart sounds normal, no murmur or gallops, no peripheral edema  Musculoskeletal: No deformities, no cyanosis or clubbing  Neuro: alert, non focal  Skin: Warm, no lesions or rashes   COPD (chronic obstructive pulmonary disease) Continue Spiriva and Symbicort  Dyspnea Both his coronary disease and his COPD appear to be managed. I  suspect that he is experiencing progressive exertional dyspnea due to slow loss of lung function with age. All the same like to perform a cardiopulmonary exercise test to see if either is pulmonary or cardiac disease is limiting his function. If so then we will pursue this further  Obstructive sleep apnea Does not qualify for CPAP

## 2015-03-17 NOTE — Patient Instructions (Addendum)
We will perform a cardiopulmonary exercise test Please continue your medications as you have been taking them  Follow with Dr [name] in 2 months or sooner if you have any problems.

## 2015-03-17 NOTE — Assessment & Plan Note (Signed)
Continue Spiriva and Symbicort. 

## 2015-03-24 ENCOUNTER — Ambulatory Visit (HOSPITAL_COMMUNITY): Payer: Medicare Other | Attending: Emergency Medicine

## 2015-03-24 DIAGNOSIS — R0600 Dyspnea, unspecified: Secondary | ICD-10-CM

## 2015-03-29 MED FILL — Medication: Qty: 1 | Status: AC

## 2015-04-02 ENCOUNTER — Other Ambulatory Visit: Payer: Self-pay | Admitting: Emergency Medicine

## 2015-04-25 ENCOUNTER — Telehealth: Payer: Self-pay | Admitting: Emergency Medicine

## 2015-04-25 DIAGNOSIS — R0600 Dyspnea, unspecified: Secondary | ICD-10-CM

## 2015-04-25 NOTE — Telephone Encounter (Signed)
Yes ok to reorder.

## 2015-04-25 NOTE — Telephone Encounter (Signed)
Spoke with pt. He is aware that we are going to reorder this test for him. Order has been placed. Nothing further was needed.

## 2015-04-25 NOTE — Telephone Encounter (Signed)
Patient calling to ask [name] to reschedule his Cardio Exercise Test.  He said that his blood sugars were too high when he was scheduled to have his last CArdio Stress test, so they would not do the test.

## 2015-04-26 ENCOUNTER — Telehealth: Payer: Self-pay | Admitting: Emergency Medicine

## 2015-04-26 NOTE — Telephone Encounter (Signed)
Called and left message with pt's family member to have pt return call.

## 2015-04-26 NOTE — Telephone Encounter (Signed)
Called spoke with [facility] from heart and vasc. They received order for pt repeat cardiopulm stree test. Pt was orginally scheduled for 03/24/15. During test pt became hypoglycemic and went out.  According to notes from the test 03/24/15: Patient's endocrinologist, Dr. Suzette Battiest was contacted and patient was advised to follow up as soon as possible. Dr. Christeen Douglas office has requested [employer] representative contact patient for insulin pump reset  They will not repeat the test until pt see's Dr. Suzette Battiest and is cleared to have this repeated. Please advise RB thanks

## 2015-04-26 NOTE — Telephone Encounter (Signed)
If Oscar Robinson still wants the test, then set him up to see Dr Suzette Battiest to get his OK / Clearance. Once cleared, reschedule the test

## 2015-04-27 NOTE — Telephone Encounter (Signed)
Spoke with pt and advised of Dr [name] recommendations.  Pt verbalized understanding.

## 2015-05-12 ENCOUNTER — Other Ambulatory Visit: Payer: Self-pay | Admitting: Cardiology

## 2015-05-16 ENCOUNTER — Encounter: Payer: Self-pay | Admitting: Cardiology

## 2015-05-16 ENCOUNTER — Ambulatory Visit (INDEPENDENT_AMBULATORY_CARE_PROVIDER_SITE_OTHER): Payer: Medicare Other | Admitting: Cardiology

## 2015-05-16 VITALS — BP 154/76 | HR 98 | Ht 70.0 in | Wt 194.4 lb

## 2015-05-16 DIAGNOSIS — F329 Major depressive disorder, single episode, unspecified: Secondary | ICD-10-CM

## 2015-05-16 DIAGNOSIS — J438 Other emphysema: Secondary | ICD-10-CM | POA: Diagnosis not present

## 2015-05-16 DIAGNOSIS — F32A Depression, unspecified: Secondary | ICD-10-CM

## 2015-05-16 DIAGNOSIS — I2510 Atherosclerotic heart disease of native coronary artery without angina pectoris: Secondary | ICD-10-CM

## 2015-05-16 MED ORDER — FUROSEMIDE 20 MG PO TABS: 20.0000 mg | ORAL_TABLET | Freq: Every day | ORAL | Status: DC

## 2015-05-16 NOTE — Patient Instructions (Signed)
Medication Instructions:  Please start Furosemide 20 mg a day.  Take for 3 days only). The current medical regimen is effective;  continue present plan and medications.  Follow-Up: Follow up in 6 months with Dr. Marlou Porch.  You will receive a letter in the mail 2 months before you are due.  Please call us when you receive this letter to schedule your follow up appointment.  Thank you for choosing [facility]!!

## 2015-05-16 NOTE — Progress Notes (Signed)
Oscar Robinson. [address], [address] [place],   [address] Phone: [phone] Fax:  [phone]  Date:  05/16/2015   ID:  Oscar Robinson, DOB 1945/08/24, MRN [id number]  PCP:  [name], MD   History of Present Illness: Oscar Robinson is a 69 y.o. male with coronary artery disease status post bypass surgery 1999, significant tobacco use, COPD with recent increasing shortness of breath, dyspnea on exertion here at the request of [name] for further evaluation.  Dyspnea with exertional with no chest pain, currently moderate in severity, relieved with rest with no obvious exacerbating factors, non-positional. No palpitations. Prior to CABG had symptoms of dyspnea mostly. Fatigue.   Last stress test was about 2 years ago he thinks in [place] at [facility]. [name] it was OK.   06/11/13 he underwent nuclear stress test where he had marked ST segment depression diffusely during exercise, relieved with rest as well as marked shortness of breath, pale. Blood pressure also decreased during exercise.  07/10/13 -native vessel LAD/diagonal bifurcation disease with patent LIMA to LAD/diagonal albeit small caliber vessel. Normal perfusion pattern on nuclear stress test. My concern previously was ST segment depression as well as decrease in blood pressure during exercise. This is described above. Ejection fraction 60%. We decided to continue with medical management. Metoprolol, amlodipine, pravastatin. No PCI.  11/18/13-he has been having some complaints of dysphagia, food getting stuck in his upper throat. One of his friends died of esophageal cancer. He is a former smoker. He still having shortness of breath. Getting treated for COPD by [name]. Prior cardiac catheterization reviewed with him.   11/17/14- still having dyspnea on exertion. No significant changes however over the past year. He enjoys breeding dogs. He is quite active. In review of [name] note, consideration to  pulmonary rehabilitation in future. Prior cardiac catheterization reviewed once again.   05/16/15- recent episode of severe shortness of breath. Woke up, thought he was going to die. Currently on prednisone taper. Having wild dreams. Lower extremity edema noted. Weight is slightly increased. He is worried about taking doxepin long-term. Would like to talk to a psychiatrist regarding this. He also is worried about undergoing cardiopulmonary exercise test as he "went out" with previous study. Glucose was 26 after he just started his insulin pump.  Wt Readings from Last 3 Encounters:  05/16/15 194 lb 6.4 oz (88.179 kg)  03/17/15 189 lb 12.8 oz (86.093 kg)  11/17/14 189 lb 1.9 oz (85.784 kg)     Past Medical History  Diagnosis Date  . GERD (gastroesophageal reflux disease)   . Hypercholesteremia   . COPD (chronic obstructive pulmonary disease) ([place])   . Diabetes mellitus   . Tobacco abuse   . Coronary artery disease   . Anxiety   . Depression   . Arthritis     Past Surgical History  Procedure Laterality Date  . Coronary artery bypass graft  1999  . Hand surgery      lt palm,ring finger,  . Hernia repair  1996    lt/rt ing hernia  . Dupuytren contracture release  04/10/2012    Procedure: DUPUYTREN CONTRACTURE RELEASE;  Surgeon: Cammie Sickle., MD;  Location: [facility];  Service: Orthopedics;  Laterality: Right;  palm, ring and small fingers dupuytrens contracture release  . Left heart catheterization with coronary/graft angiogram N/A 06/15/2013    Procedure: LEFT HEART CATHETERIZATION WITH Beatrix Fetters;  Surgeon: Candee Furbish, MD;  Location: [facility] CATH [facility];  Service: Cardiovascular;  Laterality: N/A;    Current Outpatient Prescriptions  Medication Sig Dispense Refill  . albuterol (PROAIR HFA) 108 (90 BASE) MCG/ACT inhaler Inhale 2 puffs into the lungs every 4 (four) hours as needed for wheezing or shortness of breath. 1 Inhaler 3  . ALPRAZolam (XANAX) 0.5  MG tablet Take 0.25 mg by mouth at bedtime.    Marland Kitchen amLODipine (NORVASC) 5 MG tablet TAKE ONE TABLET BY MOUTH ONCE DAILY 30 tablet 5  . aspirin 81 MG tablet Take 162 mg by mouth daily.     . Coenzyme Q10 (CO Q 10) 100 MG CAPS Take 100 mg by mouth daily.    . colesevelam (WELCHOL) 625 MG tablet Take 3 tablets (1,875 mg total) by mouth 2 (two) times daily with a meal. 360 tablet 3  . doxepin (SINEQUAN) 50 MG capsule Take 100 mg by mouth daily.    Marland Kitchen esomeprazole (NEXIUM) 40 MG capsule Take 1 capsule (40 mg total) by mouth daily at 12 noon. 30 capsule 6  . insulin glargine (LANTUS) 100 UNIT/ML injection Inject 25 Units into the skin daily.     . insulin lispro (HUMALOG KWIKPEN) 100 UNIT/ML SOPN Inject 8-15 Units into the skin 3 (three) times daily.    . metFORMIN (GLUCOPHAGE) 500 MG tablet Take 500 mg by mouth 2 (two) times daily.     . metoprolol succinate (TOPROL-XL) 50 MG 24 hr tablet TAKE ONE TABLET BY MOUTH ONCE DAILY; TAKE WITH OR IMMEDIATELY FOLLOWING  A MEAL. 30 tablet 10  . Multiple Vitamin (MULTIVITAMIN) capsule Take 1 capsule by mouth daily.    . nitroGLYCERIN (NITROSTAT) 0.4 MG SL tablet Place 1 tablet (0.4 mg total) under the tongue every 5 (five) minutes as needed for chest pain. 30 tablet 5  . Omega-3 Fatty Acids (FISH OIL) 1200 MG CAPS Take 1 capsule by mouth 2 (two) times daily.    . pravastatin (PRAVACHOL) 40 MG tablet TAKE ONE TABLET BY MOUTH ONCE DAILY 30 tablet 5  . predniSONE (DELTASONE) 10 MG tablet     . ramipril (ALTACE) 10 MG capsule TAKE ONE CAPSULE BY MOUTH ONCE DAILY 30 capsule 5  . SPIRIVA HANDIHALER 18 MCG inhalation capsule INHALE ONE DOSE BY MOUTH ONCE DAILY 30 capsule 5  . SYMBICORT 160-4.5 MCG/ACT inhaler 2 puffs 2 (two) times daily.    . SYMBICORT 160-4.5 MCG/ACT inhaler INHALE TWO PUFFS BY MOUTH EVERY 12 HOURS, RINSE  MOUTH  AFTER  EACH  USE 1 Inhaler 5   No current facility-administered medications for this visit.    Allergies:   No Known Allergies  Social  History:  The patient  reports that he quit smoking about 17 years ago. His smoking use included Cigarettes. He has a 70 pack-year smoking history. He has never used smokeless tobacco. He reports that he drinks alcohol. He reports that he does not use illicit drugs.   Family History  Problem Relation Age of Onset  . Drug abuse Brother     Cocaine  . Heart disease Neg Hx   . Heart failure Neg Hx   . Diabetes Neg Hx   . Hypertension Neg Hx   . Colon cancer Neg Hx   . Esophageal cancer Neg Hx   . Prostate cancer Neg Hx   . Pancreatic cancer Neg Hx   . Kidney disease Neg Hx   . Liver disease Neg Hx     ROS:  Please see the history of present  illness. Denies any bleeding, syncope, stroke like symptoms, dysphagia, orthopnea, PND. No rashes or   All other systems reviewed and negative.   PHYSICAL EXAM: VS:  BP 154/76 mmHg  Pulse 98  Ht 5\' 10"  (1.778 m)  Wt 194 lb 6.4 oz (88.179 kg)  BMI 27.89 kg/m2  SpO2 98% Well nourished, well developed, in no acute distress HEENT: normal, [place]/AT, EOMI Neck: no JVD, normal carotid upstroke, no bruit Cardiac:  normal S1, S2; RRR; no murmur Lungs:  clear to auscultation bilaterally, no wheezing, rhonchi or rales although he does have prolonged expiratory phase/distant breath sounds noted Abd: soft, nontender, no hepatomegaly, no bruits Ext: no edema, 2+ distal pulses Skin: warm and dry GU: deferred Neuro: no focal abnormalities noted, AAO x 3, mildly anxious  EKG:     11/17/14-normal rhythm, no other abnormalities, very subtle nonspecific T-wave changes in aVL.sinus rhythm, 73 with nonspecific ST-T wave changes     ASSESSMENT AND PLAN:  1. Shortness of breath-dyspnea on exertion was a previous anginal equivalent for him prior to his bypass surgery in 1999. Cardiac catheterization 2014 as above. Reassuring. It is likely that his underlying COPD as driving the majority of his dyspnea. (49% FEV1) Continue with medical management. Continue with current  aggressive secondary prevention. Thankfully, nuclear stress perfusion did not show any significant defects. He is currently taking prednisone. 2. COPD-[name]. Medications reviewed. Treating lung infection. Prednisone. Anxious with this, sugars challenging to control.  Woke up a few days ago could not breathe. Body was going to die. 3. Hypertension-mildly elevated, on prednisone. Medications reviewed. Lower extremity edema noted. We will give him 3 days of Lasix 20 mg a day. He will have extra if necessary. 4. Angina-nitroglycerin refilled previously. 5. Coronary artery disease-as described above-prior bypass 1999 6.  hyperlipidemia-excellent lipids previously. Multidrug regimen noted as above. 7. Diabetes-insulin-dependent, primary physician. 8.  psychiatry- depression- gave him Dr. Starleen Arms name.  He has been on doxepin for several years. He wants to discuss this medication with psychiatry. 9. We will see him back in 6 months   Signed, Candee Furbish, MD [facility]  05/16/2015 3:28 PM

## 2015-05-19 ENCOUNTER — Ambulatory Visit (INDEPENDENT_AMBULATORY_CARE_PROVIDER_SITE_OTHER): Payer: Medicare Other | Admitting: Emergency Medicine

## 2015-05-19 ENCOUNTER — Encounter: Payer: Self-pay | Admitting: Emergency Medicine

## 2015-05-19 VITALS — BP 130/80 | HR 92 | Ht 68.0 in | Wt 191.0 lb

## 2015-05-19 DIAGNOSIS — J438 Other emphysema: Secondary | ICD-10-CM | POA: Diagnosis not present

## 2015-05-19 DIAGNOSIS — I2510 Atherosclerotic heart disease of native coronary artery without angina pectoris: Secondary | ICD-10-CM

## 2015-05-19 MED ORDER — AZITHROMYCIN 250 MG PO TABS: ORAL_TABLET | ORAL | Status: AC

## 2015-05-19 NOTE — Progress Notes (Signed)
Oscar Robinson is a 69 year old gentleman with known coronary artery disease status post CABG. Also has a history of significant tobacco abuse and airflow limitation on pulmonary function testing. He has a positive bronchodilator response.   ROV 05/23/10 -- follows up today for his COPD, dyspnea. He is doing an exercise program. 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 No wheeze or cough during the day. He does cough at night when he lays flat. No real wt gain. No CP. Occas ankle swelling esp when he eats salt.   ROV 09/17/11 -- follows up today for his COPD, dyspnea. Last seen 05/2010. Has been doing fairly well, was exposed to URI about 3 weeks ago, sore throat, congestion that progressed to dyspnea. Was rx with azithro and has improved, cough and breathing both better. Did not receive steroids. Feels back to baseline. He is not very reliable with symbicort because it sometimes gives him sore throat. Tells me also that sometimes he skips the Spiriva and takes the Symbicort (seems to help his breathing more).    ROV 03/18/12 --  COPD, dyspnea.  He has been doing fairly well, is having congestion at night, doesn't wake him from sleep. Continues to take Symbicort and Spiriva, sometimes skips the pm dose Symbicort. Uses SABA very rarely. He snores and has had witnessed apneas by his wife.   ROV 09/16/12 -- COPD, dyspnea, BD responsiveness. Presents today noting that he developed cough, congestion, malaise about 10 days ago. PCP did flu swab > negative. Was started on pred taper a week ago - seemed to help him, then was exposed to person with URI over the last weekend. He is now having more cough again, chest pain from the cough. The phlegm is yellow to pink, no frank blood.    ROV 11/17/12 -- COPD, dyspnea, BD responsiveness.  F/u after URI's as above, AE-COPD. We extended his pred taper and treated w doxy >>  feels much better, probably back to baseline. He is on spiriva and symbicort. 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 He is coughing rarely, has some mucous production esp when he is supine at night.   ROV 05/01/13 -- COPD, dyspnea, BD responsiveness. He has a hx CAD/CABG in '99. Returns for f/u. He is noticing more exertional SOB over the last several months. No new cough, wheeze or adjunct symptoms. Denies CP or angina. 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 He has gained 5-10 lbs. Spiriva + symbicort are his maintenance. He has a cardiologist in [place], but not sure he want to follow there.   ROV 06/16/13 -- COPD, dyspnea, BD responsiveness. He has a hx CAD/CABG in '99.  He met and liked Dr Marlou Porch, has undergone nuclear stress and now L cath. Trial of breo seems to have been beneficial, but he isn't sure it's a lot better than symbicort.   ROV 11/03/13 -- COPD, dyspnea, BD responsiveness, CAD/CABG. He continues to have dyspnea with exertion about the same as before, not better for sure. Most noticeable with walking an incline. Remains on symbicort and spiriva but only taking the symbicort once a day. He cannot tolerate smoke, fumes. Is sensitive to pollen, dust.   Acute OV 03/09/14 -- hx of COPD and BD responsiveness, exertional dyspnea. He was evaluated by Dr Marlou Porch since last visit > his cath was reassuring, argued against his dyspnea being related  to coronary disease. He is still having exertional SOB, difficulty w tasks. This is probably worse over the last year. He is on symbicort and spiriva.   ROV 04/07/14 -- followup visit for COPD with asthmatic features, exertional dyspnea. He has been managed with Symbicort and Spiriva. He has been more dyspneic, planned to get repeat PFT as below.   Spirometry 01/07/07 - severe airflow limitation with an FEV1 of 1.56 L or 49% of predicted with a positive bronchodilator response. Repeat PFT 04/07/14 showed very severe airflow limitation with an FEV1 of 1.28 L or 49%  dictated that improved to 1.60 L or 49% predicted after bronchodilator. His lungs are hyperinflated and his diffusion capacity is decreased.   ROV 05/07/14 -- followup visit for COPD with asthmatic features, exertional dyspnea. He did a try of Anoro + Arnuity to see if they were better. May have been some improvement but not a large difference.  He is still feeling limited "can't do what I used to". He tells me that he could only carry a 4x4 50 ft without stopping. His voice is hoarse (on ramipril).   ROV 09/13/14 -- follow-up visit for COPD with a positive bronchodilator response, exertional shortness of breath. Last time we changed him back to Spiriva and Symbicort. We also stopped his ramipril and changed it to losartan.  He had a sleep study done in October that showed evidence for sleep apnea while he was in REM sleep but overall not enough to qualify for official diagnosis of OSA. He is doing a treadmill and an elliptical trainer. Still quite bothered that he can't do the activity that he used to do. He does have a hx of CABG, ? Whether this may limit his exertion. He had a cath end of 2014 that was reassuring.      ROV 03/17/15 -- history of COPD and asthmatic component with a bronchodilator response.  Also history of mild sleep apnea (did not qualify for therapy).  He follows with Dr Marlou Porch and has had reassuring eval including cath in 2014. He continues to have exertional SOB, does not feel that he is able to do as much as he should be able to do. He has had to slow down. 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 He uses his Symbicort and Spiriva as prescribed.              ROV 05/19/15 -- follow-up visit for COPD with a bronchodilator response, mild obstructive sleep apnea not on CPAP, chronic exertional dyspnea. I had arranged for him to have a cardio pulmonary exercise test but when he went for this it had to be canceled  due to hypoglycemia. He had just been started on an insulin pump. The test was not rescheduled. He reports today that he has experienced a URI, developed an AE-COPD and is currently finishing pred taper ordered for 2 weeks.  He is better but still has still SOB, no cough. On symbicort and spiriva. He is on lisinopril.     EXAM:  Filed Vitals:   05/19/15 1615  BP: 130/80  Pulse: 92  Height: _0  (1.727 m)  Weight: 191 lb (86.637 kg)  SpO2: 95%   Gen: Pleasant, well-nourished, in no distress,  normal affect  ENT: No lesions,  mouth clear,  oropharynx clear, no postnasal drip  Neck: No JVD, no TMG, no carotid bruits  Lungs: No  use of accessory muscles, no wheezes  Cardiovascular: RRR, heart sounds normal, no murmur or gallops, no peripheral edema  Musculoskeletal: No deformities, no cyanosis or clubbing  Neuro: alert, non focal  Skin: Warm, no lesions or rashes   COPD (chronic obstructive pulmonary disease) With recent exacerbation in the setting of an apparent URI. He was treated appropriately with prednisone but I will like to add azithromycin. We will defer his flu shot until this illness is resolved. We will order his cardio pulmonary exercise test at some point in the future after this has improved  Please finish the prednisone as ordered.  Take azithromycin for 5 days as directed.  Continue your spiriva and symbicort Get your flu shot after this illness passes.  We will revisit ordering a cardiopulmonary exercise test in the future.  Follow with Dr [name] in 1 month

## 2015-05-19 NOTE — Assessment & Plan Note (Signed)
With recent exacerbation in the setting of an apparent URI. He was treated appropriately with prednisone but I will like to add azithromycin. We will defer his flu shot until this illness is resolved. We will order his cardio pulmonary exercise test at some point in the future after this has improved  Please finish the prednisone as ordered.  Take azithromycin for 5 days as directed.  Continue your spiriva and symbicort Get your flu shot after this illness passes.  We will revisit ordering a cardiopulmonary exercise test in the future.  Follow with Dr [name] in 1 month

## 2015-05-19 NOTE — Patient Instructions (Signed)
Please finish the prednisone as ordered.  Take azithromycin for 5 days as directed.  Continue your spiriva and symbicort Get your flu shot after this illness passes.  We will revisit ordering a cardiopulmonary exercise test in the future.  Follow with Dr [name] in 1 month

## 2015-05-20 ENCOUNTER — Other Ambulatory Visit: Payer: Self-pay

## 2015-05-20 ENCOUNTER — Other Ambulatory Visit: Payer: Self-pay | Admitting: Cardiology

## 2015-05-20 MED ORDER — AMLODIPINE BESYLATE 5 MG PO TABS: 5.0000 mg | ORAL_TABLET | Freq: Every day | ORAL | Status: DC

## 2015-06-03 ENCOUNTER — Other Ambulatory Visit: Payer: Self-pay | Admitting: *Deleted

## 2015-06-03 MED ORDER — PRAVASTATIN SODIUM 40 MG PO TABS: 40.0000 mg | ORAL_TABLET | Freq: Every day | ORAL | Status: DC

## 2015-06-22 ENCOUNTER — Encounter: Payer: Self-pay | Admitting: Emergency Medicine

## 2015-06-22 ENCOUNTER — Ambulatory Visit (INDEPENDENT_AMBULATORY_CARE_PROVIDER_SITE_OTHER): Payer: Medicare Other | Admitting: Emergency Medicine

## 2015-06-22 VITALS — BP 132/66 | HR 85 | Ht 69.0 in | Wt 187.8 lb

## 2015-06-22 DIAGNOSIS — R0600 Dyspnea, unspecified: Secondary | ICD-10-CM | POA: Diagnosis not present

## 2015-06-22 DIAGNOSIS — J438 Other emphysema: Secondary | ICD-10-CM

## 2015-06-22 DIAGNOSIS — R0609 Other forms of dyspnea: Secondary | ICD-10-CM | POA: Diagnosis not present

## 2015-06-22 DIAGNOSIS — I2510 Atherosclerotic heart disease of native coronary artery without angina pectoris: Secondary | ICD-10-CM | POA: Diagnosis not present

## 2015-06-22 NOTE — Assessment & Plan Note (Signed)
I suspect this remains related to his COPD plus possibly some degree of deconditioning. I will reorder his cardiopulmonary Exercise testing and we will review when available

## 2015-06-22 NOTE — Progress Notes (Signed)
Oscar Robinson is a 69 year old gentleman with known coronary artery disease status post CABG. Also has a history of significant tobacco abuse and airflow limitation on pulmonary function testing. He has a positive bronchodilator response.   ROV 05/23/10 -- follows up today for his COPD, dyspnea. He is doing an exercise program. 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 No wheeze or cough during the day. He does cough at night when he lays flat. No real wt gain. No CP. Occas ankle swelling esp when he eats salt.   ROV 09/17/11 -- follows up today for his COPD, dyspnea. Last seen 05/2010. Has been doing fairly well, was exposed to URI about 3 weeks ago, sore throat, congestion that progressed to dyspnea. Was rx with azithro and has improved, cough and breathing both better. Did not receive steroids. Feels back to baseline. He is not very reliable with symbicort because it sometimes gives him sore throat. Tells me also that sometimes he skips the Spiriva and takes the Symbicort (seems to help his breathing more).    ROV 03/18/12 --  COPD, dyspnea.  He has been doing fairly well, is having congestion at night, doesn't wake him from sleep. Continues to take Symbicort and Spiriva, sometimes skips the pm dose Symbicort. Uses SABA very rarely. He snores and has had witnessed apneas by his wife.   ROV 09/16/12 -- COPD, dyspnea, BD responsiveness. Presents today noting that he developed cough, congestion, malaise about 10 days ago. PCP did flu swab > negative. Was started on pred taper a week ago - seemed to help him, then was exposed to person with URI over the last weekend. He is now having more cough again, chest pain from the cough. The phlegm is yellow to pink, no frank blood.    ROV 11/17/12 -- COPD, dyspnea, BD responsiveness.  F/u after URI's as above, AE-COPD. We extended his pred taper and treated w doxy >>  feels much better, probably back to baseline. He is on spiriva and symbicort. 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 He is coughing rarely, has some mucous production esp when he is supine at night.   ROV 05/01/13 -- COPD, dyspnea, BD responsiveness. He has a hx CAD/CABG in '99. Returns for f/u. He is noticing more exertional SOB over the last several months. No new cough, wheeze or adjunct symptoms. Denies CP or angina. 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 He has gained 5-10 lbs. Spiriva + symbicort are his maintenance. He has a cardiologist in [place], but not sure he want to follow there.   ROV 06/16/13 -- COPD, dyspnea, BD responsiveness. He has a hx CAD/CABG in '99.  He met and liked Dr Marlou Porch, has undergone nuclear stress and now L cath. Trial of breo seems to have been beneficial, but he isn't sure it's a lot better than symbicort.   ROV 11/03/13 -- COPD, dyspnea, BD responsiveness, CAD/CABG. He continues to have dyspnea with exertion about the same as before, not better for sure. Most noticeable with walking an incline. Remains on symbicort and spiriva but only taking the symbicort once a day. He cannot tolerate smoke, fumes. Is sensitive to pollen, dust.   Acute OV 03/09/14 -- hx of COPD and BD responsiveness, exertional dyspnea. He was evaluated by Dr Marlou Porch since last visit > his cath was reassuring, argued against his dyspnea being related  to coronary disease. He is still having exertional SOB, difficulty w tasks. This is probably worse over the last year. He is on symbicort and spiriva.   ROV 04/07/14 -- followup visit for COPD with asthmatic features, exertional dyspnea. He has been managed with Symbicort and Spiriva. He has been more dyspneic, planned to get repeat PFT as below.   Spirometry 01/07/07 - severe airflow limitation with an FEV1 of 1.56 L or 49% of predicted with a positive bronchodilator response. Repeat PFT 04/07/14 showed very severe airflow limitation with an FEV1 of 1.28 L or 49%  dictated that improved to 1.60 L or 49% predicted after bronchodilator. His lungs are hyperinflated and his diffusion capacity is decreased.   ROV 05/07/14 -- followup visit for COPD with asthmatic features, exertional dyspnea. He did a try of Anoro + Arnuity to see if they were better. May have been some improvement but not a large difference.  He is still feeling limited "can't do what I used to". He tells me that he could only carry a 4x4 50 ft without stopping. His voice is hoarse (on ramipril).   ROV 09/13/14 -- follow-up visit for COPD with a positive bronchodilator response, exertional shortness of breath. Last time we changed him back to Spiriva and Symbicort. We also stopped his ramipril and changed it to losartan.  He had a sleep study done in October that showed evidence for sleep apnea while he was in REM sleep but overall not enough to qualify for official diagnosis of OSA. He is doing a treadmill and an elliptical trainer. Still quite bothered that he can't do the activity that he used to do. He does have a hx of CABG, ? Whether this may limit his exertion. He had a cath end of 2014 that was reassuring.      ROV 03/17/15 -- history of COPD and asthmatic component with a bronchodilator response.  Also history of mild sleep apnea (did not qualify for therapy).  He follows with Dr Marlou Porch and has had reassuring eval including cath in 2014. He continues to have exertional SOB, does not feel that he is able to do as much as he should be able to do. He has had to slow down. 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 He uses his Symbicort and Spiriva as prescribed.              ROV 05/19/15 -- follow-up visit for COPD with a bronchodilator response, mild obstructive sleep apnea not on CPAP, chronic exertional dyspnea. I had arranged for him to have a cardio pulmonary exercise test but when he went for this it had to be canceled  due to hypoglycemia. He had just been started on an insulin pump. The test was not rescheduled. He reports today that he has experienced a URI, developed an AE-COPD and is currently finishing pred taper ordered for 2 weeks.  He is better but still has still SOB, no cough. On symbicort and spiriva. He is on lisinopril.   ROV 06/22/15 -- follow-up visit for COPD with associated bronchodilator response and asthmatic component. He also has mild obstructive sleep apnea not currently on CPAP. Last visit he was more symptomatic with evidence of possible bronchitis in the aftermath of a URI. He had been treated with prednisone and we added azithromycin - he improved significantly, breathing is better.     EXAM:  Filed Vitals:  06/22/15 1434  BP: 132/66  Pulse: 85  Height: 5' 9" (1.753 m)  Weight: 187 lb 12.8 oz (85.186 kg)  SpO2: 97%   Gen: Pleasant, well-nourished, in no distress,  normal affect  ENT: No lesions,  mouth clear,  oropharynx clear, no postnasal drip  Neck: No JVD, no TMG, no carotid bruits  Lungs: No use of accessory muscles, no wheezes  Cardiovascular: RRR, heart sounds normal, no murmur or gallops, no peripheral edema  Musculoskeletal: No deformities, no cyanosis or clubbing  Neuro: alert, non focal  Skin: Warm, no lesions or rashes   COPD (chronic obstructive pulmonary disease) With recent exacerbation and bronchitis. He is improved following treatment with azithromycin and prednisone. We will continue his current inhaler regimen.   Dyspnea I suspect this remains related to his COPD plus possibly some degree of deconditioning. I will reorder his cardiopulmonary Exercise testing and we will review when available

## 2015-06-22 NOTE — Patient Instructions (Addendum)
Please continue your inhaled medications as you have been taking them.  Flu shot today  We will re-order your cardiopulmonary exercise test Follow with Dr [name] in 2 months or sooner if you have any problems. We will review your test at that time.

## 2015-06-22 NOTE — Assessment & Plan Note (Signed)
With recent exacerbation and bronchitis. He is improved following treatment with azithromycin and prednisone. We will continue his current inhaler regimen.

## 2015-07-05 ENCOUNTER — Ambulatory Visit (HOSPITAL_COMMUNITY): Payer: Medicare Other | Attending: Emergency Medicine

## 2015-07-05 DIAGNOSIS — R0600 Dyspnea, unspecified: Secondary | ICD-10-CM | POA: Insufficient documentation

## 2015-07-05 DIAGNOSIS — R0609 Other forms of dyspnea: Secondary | ICD-10-CM

## 2015-07-20 ENCOUNTER — Inpatient Hospital Stay: Admission: RE | Admit: 2015-07-20 | Payer: Medicare Other | Source: Ambulatory Visit

## 2015-07-20 ENCOUNTER — Ambulatory Visit
Admission: RE | Admit: 2015-07-20 | Discharge: 2015-07-20 | Disposition: A | Discharge status: Home / Self Care | Payer: Medicare Other | Source: Ambulatory Visit | Attending: Endocrinology | Admitting: Endocrinology

## 2015-07-20 DIAGNOSIS — E049 Nontoxic goiter, unspecified: Secondary | ICD-10-CM

## 2015-07-21 ENCOUNTER — Encounter: Payer: Self-pay | Admitting: Pulmonary Disease

## 2015-07-21 ENCOUNTER — Ambulatory Visit (INDEPENDENT_AMBULATORY_CARE_PROVIDER_SITE_OTHER): Payer: Medicare Other | Admitting: Pulmonary Disease

## 2015-07-21 ENCOUNTER — Ambulatory Visit (INDEPENDENT_AMBULATORY_CARE_PROVIDER_SITE_OTHER)
Admission: RE | Admit: 2015-07-21 | Discharge: 2015-07-21 | Disposition: A | Discharge status: Home / Self Care | Payer: Medicare Other | Source: Ambulatory Visit | Attending: Pulmonary Disease | Admitting: Pulmonary Disease

## 2015-07-21 VITALS — BP 142/84 | HR 87 | Temp 98.1°F | Ht 69.0 in | Wt 187.0 lb

## 2015-07-21 DIAGNOSIS — J441 Chronic obstructive pulmonary disease with (acute) exacerbation: Secondary | ICD-10-CM | POA: Insufficient documentation

## 2015-07-21 DIAGNOSIS — R05 Cough: Secondary | ICD-10-CM | POA: Diagnosis not present

## 2015-07-21 DIAGNOSIS — R059 Cough, unspecified: Secondary | ICD-10-CM

## 2015-07-21 DIAGNOSIS — I2510 Atherosclerotic heart disease of native coronary artery without angina pectoris: Secondary | ICD-10-CM | POA: Diagnosis not present

## 2015-07-21 MED ORDER — PREDNISONE 10 MG PO TABS: ORAL_TABLET | ORAL | Status: DC

## 2015-07-21 MED ORDER — AZITHROMYCIN 250 MG PO TABS: ORAL_TABLET | ORAL | Status: DC

## 2015-07-21 NOTE — Patient Instructions (Signed)
Take the prednisone as prescribed Take Z-Pak as prescribed Take Mucinex over-the-counter if the chest congestion is difficult to clear We will call you with the results of the chest x-ray If you develop a fever, chest pain, or your breathing gets worse she needs to go to the emergency room Follow-up with [name] as mentioned

## 2015-07-21 NOTE — Assessment & Plan Note (Signed)
Despite adherence to his medications and not smoking unfortunately he's had a second exacerbation of COPD this year. I believe that this was due to a viral illness, but fortunately does not appear to be a flulike illness.  Plan: Prednisone taper Z-Pak again I'm going to get a chest x-ray because this was the second episode this year just to make sure there is nothing else going on Follow-up with [name] He was instructed to go the emergency room if he develops fever, chest pain or if his symptoms get worse.

## 2015-07-21 NOTE — Progress Notes (Signed)
Subjective:    Patient ID: Oscar Robinson, male    DOB: Jul 23, 1946, 69 y.o.   MRN: [id number]  HPI Chief Complaint  Patient presents with  . Acute Visit    RB pt presents with sinus pressure, prod cough with yellow mucus since Monday.     Oscar Robinson has been sick since Monday of this week.  He has been coughing up yellow sputum and has been having some sinus congestion this week.  He has been having more shortness of breath as well.  He was exposed to a child at work who was sick 6 days ago and he has been sick for the last 5 days.  He started with a cough.  He has had chills, no fever.  He is wearing "extra cover" at night.  He is more short of breath, some wheezing but not as bad as before.  No headache, no nausea or vomiting.    Past Medical History  Diagnosis Date  . GERD (gastroesophageal reflux disease)   . Hypercholesteremia   . COPD (chronic obstructive pulmonary disease) ([place])   . Diabetes mellitus   . Tobacco abuse   . Coronary artery disease   . Anxiety   . Depression   . Arthritis       Review of Systems  Constitutional: Positive for chills and fatigue. Negative for fever.  HENT: Negative for postnasal drip and sinus pressure.   Respiratory: Positive for cough, shortness of breath and wheezing.   Cardiovascular: Negative for chest pain, palpitations and leg swelling.       Objective:   Physical Exam Filed Vitals:   07/21/15 1000  BP: 142/84  Pulse: 87  Temp: 98.1 F (36.7 C)  TempSrc: Oral  Height: 5\' 9"  (1.753 m)  Weight: 187 lb (84.823 kg)  SpO2: 100%   RA  Gen: mildly ill appearing HENT: OP clear, TM's clear, neck supple PULM: Wheezing bilaterally, no crackles, normal air movement, normal percussion CV: RRR, no mgr, trace edema GI: BS+, soft, nontender Derm: no cyanosis or rash Psyche: normal mood and affect  Office records from [name] reviewed where he was treated for an improving COPD exacerbation last month No recent chest x-ray images  for review. CBC    Component Value Date/Time   WBC 8.1 06/11/2013 1531   RBC 4.17* 06/11/2013 1531   HGB 11.4* 06/11/2013 1531   HCT 35.1* 06/11/2013 1531   PLT 235.0 06/11/2013 1531   MCV 84.2 06/11/2013 1531   MCHC 32.5 06/11/2013 1531   RDW 16.5* 06/11/2013 1531   LYMPHSABS 2.3 06/11/2013 1531   MONOABS 0.9 06/11/2013 1531   EOSABS 0.2 06/11/2013 1531   BASOSABS 0.0 06/11/2013 1531         Assessment & Plan:  COPD exacerbation ([place]) Despite adherence to his medications and not smoking unfortunately he's had a second exacerbation of COPD this year. I believe that this was due to a viral illness, but fortunately does not appear to be a flulike illness.  Plan: Prednisone taper Z-Pak again I'm going to get a chest x-ray because this was the second episode this year just to make sure there is nothing else going on Follow-up with [name] He was instructed to go the emergency room if he develops fever, chest pain or if his symptoms get worse.     Current outpatient prescriptions:  .  albuterol (PROAIR HFA) 108 (90 BASE) MCG/ACT inhaler, Inhale 2 puffs into the lungs every 4 (four) hours as needed  for wheezing or shortness of breath., Disp: 1 Inhaler, Rfl: 3 .  ALPRAZolam (XANAX) 0.5 MG tablet, Take 0.25 mg by mouth at bedtime., Disp: , Rfl:  .  amLODipine (NORVASC) 5 MG tablet, Take 1 tablet (5 mg total) by mouth daily., Disp: 30 tablet, Rfl: 11 .  aspirin 81 MG tablet, Take 162 mg by mouth daily. , Disp: , Rfl:  .  Coenzyme Q10 (CO Q 10) 100 MG CAPS, Take 100 mg by mouth daily., Disp: , Rfl:  .  colesevelam (WELCHOL) 625 MG tablet, Take 3 tablets (1,875 mg total) by mouth 2 (two) times daily with a meal., Disp: 360 tablet, Rfl: 3 .  doxepin (SINEQUAN) 50 MG capsule, Take 100 mg by mouth daily., Disp: , Rfl:  .  esomeprazole (NEXIUM) 40 MG capsule, Take 1 capsule (40 mg total) by mouth daily at 12 noon., Disp: 30 capsule, Rfl: 6 .  HUMALOG 100 UNIT/ML injection, , Disp: ,  Rfl:  .  insulin lispro (HUMALOG KWIKPEN) 100 UNIT/ML SOPN, Inject 8-15 Units into the skin 3 (three) times daily., Disp: , Rfl:  .  metFORMIN (GLUCOPHAGE) 500 MG tablet, Take 500 mg by mouth 2 (two) times daily. , Disp: , Rfl:  .  metoprolol succinate (TOPROL-XL) 50 MG 24 hr tablet, TAKE ONE TABLET BY MOUTH ONCE DAILY; TAKE WITH OR IMMEDIATELY FOLLOWING  A MEAL., Disp: 30 tablet, Rfl: 10 .  Multiple Vitamin (MULTIVITAMIN) capsule, Take 1 capsule by mouth daily., Disp: , Rfl:  .  nitroGLYCERIN (NITROSTAT) 0.4 MG SL tablet, Place 1 tablet (0.4 mg total) under the tongue every 5 (five) minutes as needed for chest pain., Disp: 30 tablet, Rfl: 5 .  Omega-3 Fatty Acids (FISH OIL) 1200 MG CAPS, Take 1 capsule by mouth 2 (two) times daily., Disp: , Rfl:  .  pravastatin (PRAVACHOL) 40 MG tablet, Take 1 tablet (40 mg total) by mouth daily., Disp: 90 tablet, Rfl: 3 .  ramipril (ALTACE) 10 MG capsule, TAKE ONE CAPSULE BY MOUTH ONCE DAILY, Disp: 30 capsule, Rfl: 5 .  SPIRIVA HANDIHALER 18 MCG inhalation capsule, INHALE ONE DOSE BY MOUTH ONCE DAILY, Disp: 30 capsule, Rfl: 5 .  SYMBICORT 160-4.5 MCG/ACT inhaler, INHALE TWO PUFFS BY MOUTH EVERY 12 HOURS, RINSE  MOUTH  AFTER  EACH  USE, Disp: 1 Inhaler, Rfl: 5 .  azithromycin (ZITHROMAX) 250 MG tablet, 500mg  po day one, then 250 po daily x4 days, Disp: 6 each, Rfl: 0 .  predniSONE (DELTASONE) 10 MG tablet, Take 40mg  po daily for 3 days, then take 30mg  po daily for 3 days, then take 20mg  po daily for two days, then take 10mg  po daily for 2 days, Disp: 27 tablet, Rfl: 0

## 2015-08-01 ENCOUNTER — Other Ambulatory Visit: Payer: Self-pay | Admitting: Cardiology

## 2015-08-02 ENCOUNTER — Other Ambulatory Visit: Payer: Self-pay | Admitting: *Deleted

## 2015-08-02 MED ORDER — COLESEVELAM HCL 625 MG PO TABS: 1875.0000 mg | ORAL_TABLET | Freq: Two times a day (BID) | ORAL | Status: DC

## 2015-08-09 ENCOUNTER — Encounter: Payer: Self-pay | Admitting: Adult Health

## 2015-08-09 ENCOUNTER — Ambulatory Visit (INDEPENDENT_AMBULATORY_CARE_PROVIDER_SITE_OTHER): Payer: Medicare Other | Admitting: Adult Health

## 2015-08-09 ENCOUNTER — Telehealth: Payer: Self-pay | Admitting: Emergency Medicine

## 2015-08-09 VITALS — BP 112/70 | HR 98 | Temp 98.6°F | Ht 68.0 in | Wt 184.4 lb

## 2015-08-09 DIAGNOSIS — J441 Chronic obstructive pulmonary disease with (acute) exacerbation: Secondary | ICD-10-CM | POA: Diagnosis not present

## 2015-08-09 MED ORDER — DOXYCYCLINE HYCLATE 100 MG PO TABS: 100.0000 mg | ORAL_TABLET | Freq: Two times a day (BID) | ORAL | Status: DC

## 2015-08-09 NOTE — Telephone Encounter (Signed)
Spoke with pt. He has been added to TP's scheduled at 11:30am. Nothing further was needed.

## 2015-08-09 NOTE — Assessment & Plan Note (Signed)
Recurrent flare -on ACE inhibitor  Recent cxr w/ no acute process May need to change ACE as it can cause cough   Plan  Doxycycline 100mg  Twice daily  , take with food.  Mucinex DM Twice daily  As needed  Cough/congestion  Eat yogurt while on antibiotic.  Discuss with Primary MD that Altace may be causing your cough to be worse.  Please contact office for sooner follow up if symptoms do not improve or worsen or seek emergency care  Follow up [name]  In 6-8 weeks and As needed

## 2015-08-09 NOTE — Progress Notes (Signed)
Subjective:    Patient ID: Oscar Robinson, male    DOB: Mar 15, 1946, 70 y.o.   MRN: [id number]  HPI 70 year old male former smoker with COPD   08/09/2015  Acute OV  Pt presents for an acute office visit.  Patient complains of chest congestion, prod cough (green phlem), wheezing, chest tx, increase SOB, slight PND/nasal congestion for 5 days .  He had similar symptoms 2 weeks ago, tx w/ Zpack and prednisone on 12/15 . Got better . CXR neg  Remains on symbicort and spiriva .  On Ace inhibitor , discussed that this may cause his cough to be worse.  Says he was exposed to friend that was sick . He believes this is where he got sick from.  He denies any chest pain, orthopnea, PND, or increased leg swelling.    Past Medical History  Diagnosis Date  . GERD (gastroesophageal reflux disease)   . Hypercholesteremia   . COPD (chronic obstructive pulmonary disease) ([employer])   . Diabetes mellitus   . Tobacco abuse   . Coronary artery disease   . Anxiety   . Depression   . Arthritis     Current Outpatient Prescriptions on File Prior to Visit  Medication Sig Dispense Refill  . albuterol (PROAIR HFA) 108 (90 BASE) MCG/ACT inhaler Inhale 2 puffs into the lungs every 4 (four) hours as needed for wheezing or shortness of breath. 1 Inhaler 3  . ALPRAZolam (XANAX) 0.5 MG tablet Take 0.25 mg by mouth at bedtime.    Marland Kitchen amLODipine (NORVASC) 5 MG tablet Take 1 tablet (5 mg total) by mouth daily. 30 tablet 11  . aspirin 81 MG tablet Take 162 mg by mouth daily.     . Coenzyme Q10 (CO Q 10) 100 MG CAPS Take 100 mg by mouth daily.    . colesevelam (WELCHOL) 625 MG tablet Take 3 tablets (1,875 mg total) by mouth 2 (two) times daily with a meal. 360 tablet 3  . doxepin (SINEQUAN) 50 MG capsule Take 100 mg by mouth daily.    Marland Kitchen esomeprazole (NEXIUM) 40 MG capsule Take 1 capsule (40 mg total) by mouth daily at 12 noon. 30 capsule 6  . HUMALOG 100 UNIT/ML injection With meals    . metFORMIN (GLUCOPHAGE) 500 MG  tablet Take 500 mg by mouth 2 (two) times daily.     . metoprolol succinate (TOPROL-XL) 50 MG 24 hr tablet TAKE ONE TABLET BY MOUTH ONCE DAILY; TAKE WITH OR IMMEDIATELY FOLLOWING  A MEAL. 30 tablet 10  . Multiple Vitamin (MULTIVITAMIN) capsule Take 1 capsule by mouth daily.    . nitroGLYCERIN (NITROSTAT) 0.4 MG SL tablet Place 1 tablet (0.4 mg total) under the tongue every 5 (five) minutes as needed for chest pain. 30 tablet 5  . Omega-3 Fatty Acids (FISH OIL) 1200 MG CAPS Take 1 capsule by mouth 2 (two) times daily.    . pravastatin (PRAVACHOL) 40 MG tablet Take 1 tablet (40 mg total) by mouth daily. 90 tablet 3  . ramipril (ALTACE) 10 MG capsule TAKE ONE CAPSULE BY MOUTH ONCE DAILY 30 capsule 5  . SPIRIVA HANDIHALER 18 MCG inhalation capsule INHALE ONE DOSE BY MOUTH ONCE DAILY 30 capsule 5  . SYMBICORT 160-4.5 MCG/ACT inhaler INHALE TWO PUFFS BY MOUTH EVERY 12 HOURS, RINSE  MOUTH  AFTER  EACH  USE 1 Inhaler 5   No current facility-administered medications on file prior to visit.    Review of Systems Constitutional:   No  weight  loss, night sweats,  Fevers, chills, fatigue, or  lassitude.  HEENT:   No headaches,  Difficulty swallowing,  Tooth/dental problems, or  Sore throat,                No sneezing, itching, ear ache, + nasal congestion, post nasal drip,   CV:  No chest pain,  Orthopnea, PND, swelling in lower extremities, anasarca, dizziness, palpitations, syncope.   GI  No heartburn, indigestion, abdominal pain, nausea, vomiting, diarrhea, change in bowel habits, loss of appetite, bloody stools.   Resp   No chest wall deformity  Skin: no rash or lesions.  GU: no dysuria, change in color of urine, no urgency or frequency.  No flank pain, no hematuria   MS:  No joint pain or swelling.  No decreased range of motion.  No back pain.  Psych:  No change in mood or affect. No depression or anxiety.  No memory loss.          Objective:   Physical Exam Filed Vitals:   08/09/15  1156  BP: 112/70  Pulse: 98  Temp: 98.6 F (37 C)  TempSrc: Oral  Height: 5\' 8"  (1.727 m)  Weight: 184 lb 6.4 oz (83.643 kg)  SpO2: 98%   GEN: A/Ox3; pleasant , NAD, elderly   HEENT:  [place]/AT,  EACs-clear, TMs-wnl, NOSE-clear, THROAT-clear, no lesions, no postnasal drip or exudate noted.   NECK:  Supple w/ fair ROM; no JVD; normal carotid impulses w/o bruits; no thyromegaly or nodules palpated; no lymphadenopathy.  RESP  Decreased BS in basesno accessory muscle use, no dullness to percussion  CARD:  RRR, no m/r/g  , tr  peripheral edema, pulses intact, no cyanosis or clubbing.  GI:   Soft & nt; nml bowel sounds; no organomegaly or masses detected.  Musco: Warm bil, no deformities or joint swelling noted.   Neuro: alert, no focal deficits noted.    Skin: Warm, no lesions or rashes       Assessment & Plan:

## 2015-08-09 NOTE — Patient Instructions (Signed)
Doxycycline 100mg  Twice daily  , take with food.  Mucinex DM Twice daily  As needed  Cough/congestion  Eat yogurt while on antibiotic.  Discuss with Primary MD that Altace may be causing your cough to be worse.  Please contact office for sooner follow up if symptoms do not improve or worsen or seek emergency care  Follow up [name]  In 6-8 weeks and As needed

## 2015-08-23 ENCOUNTER — Ambulatory Visit (INDEPENDENT_AMBULATORY_CARE_PROVIDER_SITE_OTHER): Payer: Medicare Other | Admitting: Emergency Medicine

## 2015-08-23 ENCOUNTER — Encounter: Payer: Self-pay | Admitting: Emergency Medicine

## 2015-08-23 VITALS — BP 140/80 | HR 83 | Ht 69.0 in | Wt 188.0 lb

## 2015-08-23 DIAGNOSIS — J438 Other emphysema: Secondary | ICD-10-CM

## 2015-08-23 MED ORDER — TIOTROPIUM BROMIDE-OLODATEROL 2.5-2.5 MCG/ACT IN AERS: 2.0000 | INHALATION_SPRAY | Freq: Every day | RESPIRATORY_TRACT | Status: DC

## 2015-08-23 NOTE — Progress Notes (Signed)
Mr. Bucko is a 70 year old gentleman with known coronary artery disease status post CABG. Also has a history of significant tobacco abuse and airflow limitation on pulmonary function testing. He has a positive bronchodilator response.    ROV 09/13/14 -- follow-up visit for COPD with a positive bronchodilator response, exertional shortness of breath. Last time we changed him back to Spiriva and Symbicort. We also stopped his ramipril and changed it to losartan.  He had a sleep study done in October that showed evidence for sleep apnea while he was in REM sleep but overall not enough to qualify for official diagnosis of OSA. He is doing a treadmill and an elliptical trainer. Still quite bothered that he can't do the activity that he used to do. He does have a hx of CABG, ? Whether this may limit his exertion. He had a cath end of 2014 that was reassuring.      ROV 03/17/15 -- history of COPD and asthmatic component with a bronchodilator response.  Also history of mild sleep apnea (did not qualify for therapy).  He follows with Dr Marlou Porch and has had reassuring eval including cath in 2014. He continues to have exertional SOB, does not feel that he is able to do as much as he should be able to do. He has had to slow down. 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 He uses his Symbicort and Spiriva as prescribed.              ROV 05/19/15 -- follow-up visit for COPD with a bronchodilator response, mild obstructive sleep apnea not on CPAP, chronic exertional dyspnea. I had arranged for him to have a cardio pulmonary exercise test but when he went for this it had to be canceled due to hypoglycemia. He had just been started on an insulin pump. The test was not rescheduled. He reports today that he has experienced a URI, developed an AE-COPD and is currently finishing pred taper ordered for 2 weeks.  He is better but still has still SOB, no  cough. On symbicort and spiriva. He is on lisinopril.   ROV 06/22/15 -- follow-up visit for COPD with associated bronchodilator response and asthmatic component. He also has mild obstructive sleep apnea not currently on CPAP. Last visit he was more symptomatic with evidence of possible bronchitis in the aftermath of a URI. He had been treated with prednisone and we added azithromycin - he improved significantly, breathing is better.   ROV 08/23/15 -- Mr. Scurti has a history of COPD with an asthmatic component and bronchodilator response. He also has obstructive sleep apnea not on CPAP. He has been seen twice since our last visit once in December and once to weeks ago and treated with prednisone and antibiotics. He had been exposed to URI and caused apparent flares. He felt worse on the pred due to edema tremor etc.  He underwent a cardiac pulmonary exercise test 07/08/15 that was consistent with a ventilatory limitation and intact cardiac reserve. I reviewed personally and explained the results to him. He is on symbicort and spiriva. He has tried breo before - did mnot ultimately feel better on it.     EXAM:  Filed Vitals:   08/23/15 1458  BP: 140/80  Pulse: 83  Height: 5\' 9"  (1.753 m)  Weight: 188 lb (85.276 kg)  SpO2: 98%   Gen: Pleasant, well-nourished, in no distress,  normal  affect  ENT: No lesions,  mouth clear,  oropharynx clear, no postnasal drip  Neck: No JVD, no TMG, no carotid bruits  Lungs: No use of accessory muscles, no wheezes  Cardiovascular: RRR, heart sounds normal, no murmur or gallops, no peripheral edema  Musculoskeletal: No deformities, no cyanosis or clubbing  Neuro: alert, non focal  Skin: Warm, no lesions or rashes   COPD (chronic obstructive pulmonary disease) We've documented on cardio pulmonary exercise test in that he has a ventilatory limitation. He did have cardiac reserve which suggests that his COPD is the explanation for his exertional dyspnea.  There is no indication that he desaturated with exercise. I've explained to him that he is maximally medicated with bronchodilators and that this may not change beyond getting the best cardio pulmonary condition he can achieve. He understands this. We will do a trial of change in his stiolto to see if he gets more benefit from this.

## 2015-08-23 NOTE — Addendum Note (Signed)
Addended by: Desmond Dike C on: 08/23/2015 03:30 PM   Modules accepted: Orders

## 2015-08-23 NOTE — Assessment & Plan Note (Signed)
We've documented on cardio pulmonary exercise test in that he has a ventilatory limitation. He did have cardiac reserve which suggests that his COPD is the explanation for his exertional dyspnea. There is no indication that he desaturated with exercise. I've explained to him that he is maximally medicated with bronchodilators and that this may not change beyond getting the best cardio pulmonary condition he can achieve. He understands this. We will do a trial of change in his stiolto to see if he gets more benefit from this.

## 2015-08-23 NOTE — Patient Instructions (Addendum)
Stop Spiriva and symbicort We will do a trial of Stiolto once a day to see if it is superior to Spiriva / Symbicort.  Take albuterol 2 puffs up to every 4 hours if needed for shortness of breath.  Follow with Dr [name] in 2 months or sooner if you have any problems.

## 2015-09-30 ENCOUNTER — Ambulatory Visit: Payer: Medicare Other | Admitting: Emergency Medicine

## 2015-10-03 ENCOUNTER — Ambulatory Visit: Payer: Medicare Other | Admitting: Adult Health

## 2015-11-04 ENCOUNTER — Encounter: Payer: Self-pay | Admitting: Emergency Medicine

## 2015-11-04 ENCOUNTER — Ambulatory Visit (INDEPENDENT_AMBULATORY_CARE_PROVIDER_SITE_OTHER): Payer: Medicare Other | Admitting: Emergency Medicine

## 2015-11-04 VITALS — BP 116/60 | HR 73 | Ht 69.0 in | Wt 185.0 lb

## 2015-11-04 DIAGNOSIS — J438 Other emphysema: Secondary | ICD-10-CM

## 2015-11-04 DIAGNOSIS — G4733 Obstructive sleep apnea (adult) (pediatric): Secondary | ICD-10-CM | POA: Diagnosis not present

## 2015-11-04 MED ORDER — PREDNISONE 10 MG PO TABS: ORAL_TABLET | ORAL | Status: DC

## 2015-11-04 MED ORDER — LEVOFLOXACIN 500 MG PO TABS: 500.0000 mg | ORAL_TABLET | Freq: Every day | ORAL | Status: DC

## 2015-11-04 NOTE — Assessment & Plan Note (Addendum)
No improvement with stiolto. Continue spiriva and symbicort.  appears to have an acute exacerbation in setting of URI  > will treat with levaquin and pred taper.  Follow with TP in 2-3 weeks to insure resolution

## 2015-11-04 NOTE — Assessment & Plan Note (Signed)
Not currently on CPAP

## 2015-11-04 NOTE — Patient Instructions (Signed)
Please continue Spiriva and Symbicort as you have been taking them Please take Levaquin 500 mg daily for the next 7 days until completely gone Please take prednisone taper as directed until completely gone Please follow up with our nurse practitioner in 2-3 weeks to ensure that you have improved Follow with Dr [name] in 4 months or sooner if you have any problems.

## 2015-11-04 NOTE — Progress Notes (Signed)
Mr. Bennink is a 70 year old gentleman with known coronary artery disease status post CABG. Also has a history of significant tobacco abuse and airflow limitation on pulmonary function testing. He has a positive bronchodilator response.    ROV 09/13/14 -- follow-up visit for COPD with a positive bronchodilator response, exertional shortness of breath. Last time we changed him back to Spiriva and Symbicort. We also stopped his ramipril and changed it to losartan.  He had a sleep study done in October that showed evidence for sleep apnea while he was in REM sleep but overall not enough to qualify for official diagnosis of OSA. He is doing a treadmill and an elliptical trainer. Still quite bothered that he can't do the activity that he used to do. He does have a hx of CABG, ? Whether this may limit his exertion. He had a cath end of 2014 that was reassuring.      ROV 03/17/15 -- history of COPD and asthmatic component with a bronchodilator response.  Also history of mild sleep apnea (did not qualify for therapy).  He follows with Dr Marlou Porch and has had reassuring eval including cath in 2014. He continues to have exertional SOB, does not feel that he is able to do as much as he should be able to do. He has had to slow down. 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 He uses his Symbicort and Spiriva as prescribed.              ROV 05/19/15 -- follow-up visit for COPD with a bronchodilator response, mild obstructive sleep apnea not on CPAP, chronic exertional dyspnea. I had arranged for him to have a cardio pulmonary exercise test but when he went for this it had to be canceled due to hypoglycemia. He had just been started on an insulin pump. The test was not rescheduled. He reports today that he has experienced a URI, developed an AE-COPD and is currently finishing pred taper ordered for 2 weeks.  He is better but still has still SOB, no  cough. On symbicort and spiriva. He is on lisinopril.   ROV 06/22/15 -- follow-up visit for COPD with associated bronchodilator response and asthmatic component. He also has mild obstructive sleep apnea not currently on CPAP. Last visit he was more symptomatic with evidence of possible bronchitis in the aftermath of a URI. He had been treated with prednisone and we added azithromycin - he improved significantly, breathing is better.   ROV 08/23/15 -- Mr. Choate has a history of COPD with an asthmatic component and bronchodilator response. He also has obstructive sleep apnea not on CPAP. He has been seen twice since our last visit once in December and once to weeks ago and treated with prednisone and antibiotics. He had been exposed to URI and caused apparent flares. He felt worse on the pred due to edema tremor etc.  He underwent a cardiac pulmonary exercise test 07/08/15 that was consistent with a ventilatory limitation and intact cardiac reserve. I reviewed personally and explained the results to him. He is on symbicort and spiriva. He has tried breo before - did mnot ultimately feel better on it.   ROV 11/04/15 -- patient with a history of COPD and positive bronchodilator response. He also has a history of sleep apnea that has not been treated with CPAP. We performed cardiopulmonary exercise testing that was consistent with a ventilatory limitation on  his breathing and exertional tolerance. At his last visit we tried changing Spiriva plus Symbicort to Stiolto to see if he would benefit. He did not feel that it helped him as much as the prior combination, so he is now back to Spiriva and Symbicort. He has been able to be active. He was exposed to a URI recently, has started to have cough, sputum, no fevers, some fatigue and aches, possibly a chill.     EXAM:  Filed Vitals:   11/04/15 1457  BP: 116/60  Pulse: 73  Height: 5\' 9"  (1.753 m)  Weight: 185 lb (83.915 kg)  SpO2: 94%   Gen: Pleasant,  well-nourished, in no distress,  normal affect  ENT: No lesions,  mouth clear,  oropharynx clear, no postnasal drip  Neck: No JVD, no TMG, no carotid bruits  Lungs: No use of accessory muscles, no wheezes  Cardiovascular: RRR, heart sounds normal, no murmur or gallops, no peripheral edema  Musculoskeletal: No deformities, no cyanosis or clubbing  Neuro: alert, non focal  Skin: Warm, no lesions or rashes   Obstructive sleep apnea Not currently on CPAP  COPD (chronic obstructive pulmonary disease) No improvement with stiolto. Continue spiriva and symbicort.  appears to have an acute exacerbation in setting of URI  > will treat with levaquin and pred taper.  Follow with TP in 2-3 weeks to insure resolution

## 2015-11-07 ENCOUNTER — Other Ambulatory Visit: Payer: Self-pay | Admitting: Cardiology

## 2015-11-08 ENCOUNTER — Other Ambulatory Visit: Payer: Self-pay | Admitting: Cardiology

## 2015-11-25 ENCOUNTER — Encounter: Payer: Self-pay | Admitting: Adult Health

## 2015-11-25 ENCOUNTER — Ambulatory Visit (INDEPENDENT_AMBULATORY_CARE_PROVIDER_SITE_OTHER): Payer: Medicare Other | Admitting: Adult Health

## 2015-11-25 VITALS — BP 126/62 | HR 106 | Temp 98.2°F | Ht 68.0 in | Wt 181.0 lb

## 2015-11-25 DIAGNOSIS — J449 Chronic obstructive pulmonary disease, unspecified: Secondary | ICD-10-CM

## 2015-11-25 NOTE — Assessment & Plan Note (Signed)
Continue on current regimen  Follow up in 3-4 months with [name] and As needed.

## 2015-11-25 NOTE — Progress Notes (Signed)
Subjective:    Patient ID: Oscar Robinson, male    DOB: 1946-04-30, 70 y.o.   MRN: [id number]  HPI 70 year old male former smoker with severe COPD with a positive bronchodilator response.  Patient has mild OSA not on CPAP  Patient has coronary artery disease status post CABG  11/25/2015 Follow-up: COPD  Patient returns for a three-week follow-up. Patient was seen last visit by [name] for a COPD exacerbation. He was treated with Levaquin and a prednisone taper. Patient is feeling better with decreased shortness of breath and cough. He denies any fever, hemoptysis, orthopnea, PND or leg swelling. Patient does get winded with prolonged walking. Has some residual cough mainly dry.    Past Medical History  Diagnosis Date  . GERD (gastroesophageal reflux disease)   . Hypercholesteremia   . COPD (chronic obstructive pulmonary disease) ([place])   . Diabetes mellitus   . Tobacco abuse   . Coronary artery disease   . Anxiety   . Depression   . Arthritis    Current Outpatient Prescriptions on File Prior to Visit  Medication Sig Dispense Refill  . albuterol (PROAIR HFA) 108 (90 BASE) MCG/ACT inhaler Inhale 2 puffs into the lungs every 4 (four) hours as needed for wheezing or shortness of breath. 1 Inhaler 3  . ALPRAZolam (XANAX) 0.5 MG tablet Take 0.25 mg by mouth at bedtime.    Marland Kitchen amLODipine (NORVASC) 5 MG tablet Take 1 tablet (5 mg total) by mouth daily. 30 tablet 11  . aspirin 81 MG tablet Take 162 mg by mouth daily.     . Coenzyme Q10 (CO Q 10) 100 MG CAPS Take 100 mg by mouth daily.    . colesevelam (WELCHOL) 625 MG tablet Take 3 tablets (1,875 mg total) by mouth 2 (two) times daily with a meal. 360 tablet 3  . doxepin (SINEQUAN) 50 MG capsule Take 100 mg by mouth daily.    Marland Kitchen esomeprazole (NEXIUM) 40 MG capsule Take 1 capsule (40 mg total) by mouth daily at 12 noon. 30 capsule 6  . HUMALOG 100 UNIT/ML injection With meals    . metFORMIN (GLUCOPHAGE) 500 MG tablet Take 500 mg by mouth  2 (two) times daily.     . metoprolol succinate (TOPROL-XL) 50 MG 24 hr tablet TAKE ONE TABLET BY MOUTH ONCE DAILY TAKE  WITH  OR  IMMEDIATELY  FOLLOWING  A  MEAL 30 tablet 1  . Multiple Vitamin (MULTIVITAMIN) capsule Take 1 capsule by mouth daily.    . nitroGLYCERIN (NITROSTAT) 0.4 MG SL tablet Place 1 tablet (0.4 mg total) under the tongue every 5 (five) minutes as needed for chest pain. 30 tablet 5  . Omega-3 Fatty Acids (FISH OIL) 1200 MG CAPS Take 1 capsule by mouth 2 (two) times daily.    . pravastatin (PRAVACHOL) 40 MG tablet Take 1 tablet (40 mg total) by mouth daily. 90 tablet 3  . ramipril (ALTACE) 10 MG capsule TAKE ONE CAPSULE BY MOUTH ONCE DAILY 30 capsule 5  . SPIRIVA HANDIHALER 18 MCG inhalation capsule INHALE ONE DOSE BY MOUTH ONCE DAILY 30 capsule 5  . SYMBICORT 160-4.5 MCG/ACT inhaler INHALE TWO PUFFS BY MOUTH EVERY 12 HOURS, RINSE  MOUTH  AFTER  EACH  USE 1 Inhaler 5   No current facility-administered medications on file prior to visit.       Review of Systems Constitutional:   No  weight loss, night sweats,  Fevers, chills, fatigue, or  lassitude.  HEENT:   No  headaches,  Difficulty swallowing,  Tooth/dental problems, or  Sore throat,                No sneezing, itching, ear ache, nasal congestion, post nasal drip,   CV:  No chest pain,  Orthopnea, PND, swelling in lower extremities, anasarca, dizziness, palpitations, syncope.   GI  No heartburn, indigestion, abdominal pain, nausea, vomiting, diarrhea, change in bowel habits, loss of appetite, bloody stools.   Resp:   No chest wall deformity  Skin: no rash or lesions.  GU: no dysuria, change in color of urine, no urgency or frequency.  No flank pain, no hematuria   MS:  No joint pain or swelling.  No decreased range of motion.  No back pain.  Psych:  No change in mood or affect. No depression or anxiety.  No memory loss.         Objective:   Physical Exam  Filed Vitals:   11/25/15 1519  BP: 126/62    Pulse: 106  Temp: 98.2 F (36.8 C)  TempSrc: Oral  Height: 5\' 8"  (1.727 m)  Weight: 181 lb (82.101 kg)  SpO2: 96%    GEN: A/Ox3; pleasant , NAD, elderly   HEENT:  [place]/AT,  EACs-clear, TMs-wnl, NOSE-clear, THROAT-clear, no lesions, no postnasal drip or exudate noted.   NECK:  Supple w/ fair ROM; no JVD; normal carotid impulses w/o bruits; no thyromegaly or nodules palpated; no lymphadenopathy.  RESP  Decreased BS in bases , .no accessory muscle use, no dullness to percussion  CARD:  RRR, no m/r/g  , no peripheral edema, pulses intact, no cyanosis or clubbing.  GI:   Soft & nt; nml bowel sounds; no organomegaly or masses detected.  Musco: Warm bil, no deformities or joint swelling noted.   Neuro: alert, no focal deficits noted.    Skin: Warm, no lesions or rashes  Tammy Parrett NP-C  [facility]  11/25/2015       Assessment & Plan:

## 2015-11-25 NOTE — Patient Instructions (Addendum)
Continue on current regimen  Follow up in 3-4 months with [name] and As needed.

## 2015-12-05 ENCOUNTER — Encounter: Payer: Self-pay | Admitting: Cardiology

## 2015-12-05 ENCOUNTER — Ambulatory Visit: Payer: Medicare Other | Admitting: Cardiology

## 2015-12-05 ENCOUNTER — Ambulatory Visit (INDEPENDENT_AMBULATORY_CARE_PROVIDER_SITE_OTHER): Payer: Medicare Other | Admitting: Cardiology

## 2015-12-05 VITALS — BP 130/70 | HR 86 | Ht 69.0 in | Wt 181.1 lb

## 2015-12-05 DIAGNOSIS — R0600 Dyspnea, unspecified: Secondary | ICD-10-CM

## 2015-12-05 DIAGNOSIS — J438 Other emphysema: Secondary | ICD-10-CM

## 2015-12-05 DIAGNOSIS — R0602 Shortness of breath: Secondary | ICD-10-CM | POA: Diagnosis not present

## 2015-12-05 DIAGNOSIS — E7800 Pure hypercholesterolemia, unspecified: Secondary | ICD-10-CM

## 2015-12-05 DIAGNOSIS — Z87891 Personal history of nicotine dependence: Secondary | ICD-10-CM

## 2015-12-05 DIAGNOSIS — I2510 Atherosclerotic heart disease of native coronary artery without angina pectoris: Secondary | ICD-10-CM | POA: Diagnosis not present

## 2015-12-05 NOTE — Patient Instructions (Signed)
Medication Instructions:  The current medical regimen is effective;  continue present plan and medications.  Testing/Procedures: Your physician has requested that you have an echocardiogram. Echocardiography is a painless test that uses sound waves to create images of your heart. It provides your doctor with information about the size and shape of your heart and how well your heart's chambers and valves are working. This procedure takes approximately one hour. There are no restrictions for this procedure.  Follow-Up: Follow up in 6 months with Dr. Skains.  You will receive a letter in the mail 2 months before you are due.  Please call us when you receive this letter to schedule your follow up appointment.  If you need a refill on your cardiac medications before your next appointment, please call your pharmacy.  Thank you for choosing [facility]!!       

## 2015-12-05 NOTE — Progress Notes (Signed)
[name]. [address], [place]  [address] Phone: [phone] Fax:  [phone]  Date:  12/05/2015   ID:  Oscar Robinson, DOB 08-16-1945, MRN [id number]  PCP:  [name], MD   History of Present Illness: Oscar Robinson is a 70 y.o. male with coronary artery disease status post bypass surgery 1999, significant tobacco use, COPD with recent increasing shortness of breath, dyspnea on exertion here at the request of [name] for further evaluation.  Dyspnea with exertional with no chest pain, currently moderate in severity, relieved with rest with no obvious exacerbating factors, non-positional. No palpitations. Prior to CABG had symptoms of dyspnea mostly. Fatigue.   Last stress test was about 2 years ago he thinks in [place] at [facility]. [name] it was OK.   06/11/13 he underwent nuclear stress test where he had marked ST segment depression diffusely during exercise, relieved with rest as well as marked shortness of breath, pale. Blood pressure also decreased during exercise.  07/10/13 -native vessel LAD/diagonal bifurcation disease with patent LIMA to LAD/diagonal albeit small caliber vessel. Normal perfusion pattern on nuclear stress test. My concern previously was ST segment depression as well as decrease in blood pressure during exercise. This is described above. Ejection fraction 60%. We decided to continue with medical management. Metoprolol, amlodipine, pravastatin. No PCI.  11/18/13-he has been having some complaints of dysphagia, food getting stuck in his upper throat. One of his friends died of esophageal cancer. He is a former smoker. He still having shortness of breath. Getting treated for COPD by [name]. Prior cardiac catheterization reviewed with him.   11/17/14- still having dyspnea on exertion. No significant changes however over the past year. He enjoys breeding dogs. He is quite active. In review of [name] note, consideration to  pulmonary rehabilitation in future. Prior cardiac catheterization reviewed once again.   05/16/15- recent episode of severe shortness of breath. Woke up, thought he was going to die. Currently on prednisone taper. Having wild dreams. Lower extremity edema noted. Weight is slightly increased. He is worried about taking doxepin long-term. Would like to talk to a psychiatrist regarding this. He also is worried about undergoing cardiopulmonary exercise test as he "went out" with previous study. Glucose was 26 after he just started his insulin pump.  12/05/15-overall feeling okay. Shortness of breath chronic from COPD. Has been seeing [name], [name]. Underwent cardiopulmonary exercise test on 07/08/15 which showed fairly significant ventilatory obstruction. Chest x-ray on 07/21/15 shows COPD without any evidence of pneumonia. No chest pain. Does well.  Wt Readings from Last 3 Encounters:  12/05/15 181 lb 1.9 oz (82.155 kg)  11/25/15 181 lb (82.101 kg)  11/04/15 185 lb (83.915 kg)     Past Medical History  Diagnosis Date  . GERD (gastroesophageal reflux disease)   . Hypercholesteremia   . COPD (chronic obstructive pulmonary disease) ([place])   . Diabetes mellitus   . Tobacco abuse   . Coronary artery disease   . Anxiety   . Depression   . Arthritis     Past Surgical History  Procedure Laterality Date  . Coronary artery bypass graft  1999  . Hand surgery      lt palm,ring finger,  . Hernia repair  1996    lt/rt ing hernia  . Dupuytren contracture release  04/10/2012    Procedure: DUPUYTREN CONTRACTURE RELEASE;  Surgeon: Cammie Sickle., MD;  Location: [facility];  Service: Orthopedics;  Laterality: Right;  palm, ring and small fingers dupuytrens contracture release  . Left heart catheterization with coronary/graft angiogram N/A 06/15/2013    Procedure: LEFT HEART CATHETERIZATION WITH Beatrix Fetters;  Surgeon: Candee Furbish, MD;  Location: [facility] C[facility] LAB;  Service:  Cardiovascular;  Laterality: N/A;    Current Outpatient Prescriptions  Medication Sig Dispense Refill  . albuterol (PROAIR HFA) 108 (90 BASE) MCG/ACT inhaler Inhale 2 puffs into the lungs every 4 (four) hours as needed for wheezing or shortness of breath. 1 Inhaler 3  . ALPRAZolam (XANAX) 0.5 MG tablet Take 0.25 mg by mouth at bedtime.    Marland Kitchen amLODipine (NORVASC) 5 MG tablet Take 1 tablet (5 mg total) by mouth daily. 30 tablet 11  . aspirin 81 MG tablet Take 162 mg by mouth daily.     . Coenzyme Q10 (CO Q 10) 100 MG CAPS Take 100 mg by mouth daily.    . colesevelam (WELCHOL) 625 MG tablet Take 3 tablets (1,875 mg total) by mouth 2 (two) times daily with a meal. 360 tablet 3  . doxepin (SINEQUAN) 50 MG capsule Take 100 mg by mouth daily.    Marland Kitchen esomeprazole (NEXIUM) 40 MG capsule Take 1 capsule (40 mg total) by mouth daily at 12 noon. 30 capsule 6  . HUMALOG 100 UNIT/ML injection With meals/has a pump    . metFORMIN (GLUCOPHAGE) 500 MG tablet Take 500 mg by mouth 2 (two) times daily.     . metoprolol succinate (TOPROL-XL) 50 MG 24 hr tablet TAKE ONE TABLET BY MOUTH ONCE DAILY TAKE  WITH  OR  IMMEDIATELY  FOLLOWING  A  MEAL 30 tablet 1  . Multiple Vitamin (MULTIVITAMIN) capsule Take 1 capsule by mouth daily.    . nitroGLYCERIN (NITROSTAT) 0.4 MG SL tablet Place 1 tablet (0.4 mg total) under the tongue every 5 (five) minutes as needed for chest pain. 30 tablet 5  . Omega-3 Fatty Acids (FISH OIL) 1200 MG CAPS Take 1 capsule by mouth 2 (two) times daily.    . pravastatin (PRAVACHOL) 40 MG tablet Take 1 tablet (40 mg total) by mouth daily. 90 tablet 3  . ramipril (ALTACE) 10 MG capsule TAKE ONE CAPSULE BY MOUTH ONCE DAILY 30 capsule 5  . SPIRIVA HANDIHALER 18 MCG inhalation capsule INHALE ONE DOSE BY MOUTH ONCE DAILY 30 capsule 5  . SYMBICORT 160-4.5 MCG/ACT inhaler INHALE TWO PUFFS BY MOUTH EVERY 12 HOURS, RINSE  MOUTH  AFTER  EACH  USE 1 Inhaler 5   No current facility-administered medications for  this visit.    Allergies:   No Known Allergies  Social History:  The patient  reports that he quit smoking about 18 years ago. His smoking use included Cigarettes. He has a 70 pack-year smoking history. He has never used smokeless tobacco. He reports that he drinks alcohol. He reports that he does not use illicit drugs.   Family History  Problem Relation Age of Onset  . Drug abuse Brother     Cocaine  . Heart disease Neg Hx   . Heart failure Neg Hx   . Diabetes Neg Hx   . Hypertension Neg Hx   . Colon cancer Neg Hx   . Esophageal cancer Neg Hx   . Prostate cancer Neg Hx   . Pancreatic cancer Neg Hx   . Kidney disease Neg Hx   . Liver disease Neg Hx     ROS:  Please see the history of present illness. Denies  any bleeding, syncope, stroke like symptoms, dysphagia, orthopnea, PND. No rashes or   All other systems reviewed and negative.   PHYSICAL EXAM: VS:  BP 130/70 mmHg  Pulse 86  Ht 5\' 9"  (1.753 m)  Wt 181 lb 1.9 oz (82.155 kg)  BMI 26.73 kg/m2 Well nourished, well developed, in no acute distress HEENT: normal, [place]/AT, EOMI Neck: no JVD, normal carotid upstroke, no bruit Cardiac:  normal S1, S2; RRR; no murmur Lungs:  clear to auscultation bilaterally, mild wheezing, no rhonchi or rales although he does have prolonged expiratory phase/distant breath sounds noted Abd: soft, nontender, no hepatomegaly, no bruits Ext: no edema, 2+ distal pulses Skin: warm and dry GU: deferred Neuro: no focal abnormalities noted, AAO x 3, mildly anxious  EKG:   EKG was done today. 12/05/15-sinus rhythm, 86, no other significant changes. Nonspecific ST-T wave changes.  11/17/14-normal rhythm, no other abnormalities, very subtle nonspecific T-wave changes in aVL.sinus rhythm, 73 with nonspecific ST-T wave changes     ASSESSMENT AND PLAN:  Shortness of breath-dyspnea on exertion was a previous anginal equivalent for him prior to his bypass surgery in 1999. Cardiac catheterization 2014 as above.  Reassuring. It is likely that his underlying COPD as driving the majority of his dyspnea. (49% FEV1) Continue with medical management. Continue with current aggressive secondary prevention. Thankfully, nuclear stress perfusion did not show any significant defects. He is currently taking prednisone.  COPD-[name]. Medications reviewed.   Hypertension-overall well controlled.  Angina-nitroglycerin refilled previously.  Coronary artery disease-as described above-prior CABG 1999. Checking echocardiogram.  hyperlipidemia-excellent lipids previously. Multidrug regimen noted as above.  Diabetes-insulin-dependent, primary physician.  psychiatry- depression- gave him Dr. Starleen Arms name previously. I don't think that handed up talking with him.  He has been on doxepin for several years.   We will see him back in 6 months at his request  Signed, Candee Furbish, MD [facility]  12/05/2015 3:57 PM

## 2015-12-16 ENCOUNTER — Ambulatory Visit (HOSPITAL_COMMUNITY): Payer: Medicare Other | Attending: Cardiovascular Disease

## 2015-12-16 ENCOUNTER — Other Ambulatory Visit: Payer: Self-pay

## 2015-12-16 DIAGNOSIS — E785 Hyperlipidemia, unspecified: Secondary | ICD-10-CM | POA: Diagnosis not present

## 2015-12-16 DIAGNOSIS — Z951 Presence of aortocoronary bypass graft: Secondary | ICD-10-CM | POA: Diagnosis not present

## 2015-12-16 DIAGNOSIS — R0602 Shortness of breath: Secondary | ICD-10-CM | POA: Insufficient documentation

## 2015-12-16 DIAGNOSIS — E119 Type 2 diabetes mellitus without complications: Secondary | ICD-10-CM | POA: Insufficient documentation

## 2015-12-16 DIAGNOSIS — I2510 Atherosclerotic heart disease of native coronary artery without angina pectoris: Secondary | ICD-10-CM | POA: Insufficient documentation

## 2015-12-16 DIAGNOSIS — R0600 Dyspnea, unspecified: Secondary | ICD-10-CM | POA: Diagnosis present

## 2015-12-16 DIAGNOSIS — Z87891 Personal history of nicotine dependence: Secondary | ICD-10-CM | POA: Diagnosis not present

## 2015-12-20 ENCOUNTER — Other Ambulatory Visit: Payer: Self-pay | Admitting: Emergency Medicine

## 2015-12-22 ENCOUNTER — Telehealth: Payer: Self-pay | Admitting: Cardiology

## 2015-12-22 NOTE — Telephone Encounter (Signed)
Follow Up   Pt is returning the call for results

## 2015-12-26 NOTE — Telephone Encounter (Signed)
Received this message today.  Left message for pt to c/b for results

## 2015-12-26 NOTE — Telephone Encounter (Signed)
Follow Up ° °Pt returned call//  °

## 2015-12-26 NOTE — Telephone Encounter (Signed)
Reviewed results of echo with pt who states understanding.

## 2016-01-05 ENCOUNTER — Other Ambulatory Visit: Payer: Self-pay | Admitting: Cardiology

## 2016-01-24 ENCOUNTER — Encounter: Payer: Self-pay | Admitting: Adult Health

## 2016-01-24 ENCOUNTER — Ambulatory Visit (INDEPENDENT_AMBULATORY_CARE_PROVIDER_SITE_OTHER)
Admission: RE | Admit: 2016-01-24 | Discharge: 2016-01-24 | Disposition: A | Discharge status: Home / Self Care | Payer: Medicare Other | Source: Ambulatory Visit | Attending: Adult Health | Admitting: Adult Health

## 2016-01-24 ENCOUNTER — Ambulatory Visit (INDEPENDENT_AMBULATORY_CARE_PROVIDER_SITE_OTHER): Payer: Medicare Other | Admitting: Adult Health

## 2016-01-24 VITALS — BP 140/70 | HR 77 | Temp 97.7°F | Ht 70.0 in | Wt 183.0 lb

## 2016-01-24 DIAGNOSIS — J441 Chronic obstructive pulmonary disease with (acute) exacerbation: Secondary | ICD-10-CM

## 2016-01-24 DIAGNOSIS — I2510 Atherosclerotic heart disease of native coronary artery without angina pectoris: Secondary | ICD-10-CM | POA: Diagnosis not present

## 2016-01-24 MED ORDER — AMOXICILLIN-POT CLAVULANATE 875-125 MG PO TABS: 1.0000 | ORAL_TABLET | Freq: Two times a day (BID) | ORAL | Status: AC

## 2016-01-24 MED ORDER — LEVALBUTEROL HCL 0.63 MG/3ML IN NEBU
0.6300 mg | INHALATION_SOLUTION | Freq: Once | RESPIRATORY_TRACT | Status: AC
  Administered 2016-01-24: 0.63 mg via RESPIRATORY_TRACT

## 2016-01-24 NOTE — Addendum Note (Signed)
Addended by: Osa Craver on: 01/24/2016 02:54 PM   Modules accepted: Orders

## 2016-01-24 NOTE — Patient Instructions (Addendum)
Augmentin Twice daily  , take with food.  Finish your prednisone as directed.  Chest xray today .  Mucinex DM Twice daily  As needed  Cough/congestion  Eat yogurt while on antibiotic.  Discuss with primary MD that Altace may be aggravating your cough .  Please contact office for sooner follow up if symptoms do not improve or worsen or seek emergency care  Follow up [name]  In 2-3 months and As needed

## 2016-01-24 NOTE — Assessment & Plan Note (Addendum)
Slow to resolve COPD flare  ? ACE related cough  Check cxr  Xopenex neb. treatment given in office  Plan  Augmentin Twice daily  , take with food.  Finish your prednisone as directed.  Chest xray today .  Mucinex DM Twice daily  As needed  Cough/congestion  Eat yogurt while on antibiotic.  Discuss with primary MD that Altace may be aggravating your cough .  Please contact office for sooner follow up if symptoms do not improve or worsen or seek emergency care  Follow up [name]  In 2-3 months and As needed

## 2016-01-24 NOTE — Progress Notes (Signed)
Subjective:    Patient ID: Oscar Robinson, male    DOB: 1946/06/27, 70 y.o.   MRN: [id number]  HPI 70 year old male former smoker with severe COPD with a positive bronchodilator response.  Patient has mild OSA not on CPAP  Patient has coronary artery disease status post CABG  01/24/2016 Acute OV : COPD  Patient presents for an acute office visit. . Patient complains of prod cough with green mucus, chest congestion, SOB and wheezing. Denies any sinus pressure/drainage, fever, nausea or vomiting.  Seen PCP 1 week ago, given prednisone taper . Has few days left on taper.  Cough is only minimally improved. Complains of persistent green mucus.  Denies chest pain , orthpnea, hemoptysis, edema or fever.  Remains on Spiriva and Symbicort.     Past Medical History  Diagnosis Date  . GERD (gastroesophageal reflux disease)   . Hypercholesteremia   . COPD (chronic obstructive pulmonary disease) ([place])   . Diabetes mellitus   . Tobacco abuse   . Coronary artery disease   . Anxiety   . Depression   . Arthritis    Current Outpatient Prescriptions on File Prior to Visit  Medication Sig Dispense Refill  . albuterol (PROAIR HFA) 108 (90 BASE) MCG/ACT inhaler Inhale 2 puffs into the lungs every 4 (four) hours as needed for wheezing or shortness of breath. 1 Inhaler 3  . ALPRAZolam (XANAX) 0.5 MG tablet Take 0.25 mg by mouth at bedtime.    Marland Kitchen amLODipine (NORVASC) 5 MG tablet Take 1 tablet (5 mg total) by mouth daily. 30 tablet 11  . aspirin 81 MG tablet Take 162 mg by mouth daily.     . Coenzyme Q10 (CO Q 10) 100 MG CAPS Take 100 mg by mouth daily.    . colesevelam (WELCHOL) 625 MG tablet Take 3 tablets (1,875 mg total) by mouth 2 (two) times daily with a meal. 360 tablet 3  . doxepin (SINEQUAN) 50 MG capsule Take 100 mg by mouth daily.    Marland Kitchen esomeprazole (NEXIUM) 40 MG capsule Take 1 capsule (40 mg total) by mouth daily at 12 noon. 30 capsule 6  . HUMALOG 100 UNIT/ML injection With meals/has a  pump    . metFORMIN (GLUCOPHAGE) 500 MG tablet Take 500 mg by mouth 2 (two) times daily.     . metoprolol succinate (TOPROL-XL) 50 MG 24 hr tablet TAKE ONE TABLET BY MOUTH ONCE DAILY WITH OR IMMEDIATELY FOLLOWING A MEAL 30 tablet 11  . Multiple Vitamin (MULTIVITAMIN) capsule Take 1 capsule by mouth daily.    . nitroGLYCERIN (NITROSTAT) 0.4 MG SL tablet Place 1 tablet (0.4 mg total) under the tongue every 5 (five) minutes as needed for chest pain. 30 tablet 5  . Omega-3 Fatty Acids (FISH OIL) 1200 MG CAPS Take 1 capsule by mouth 2 (two) times daily.    . pravastatin (PRAVACHOL) 40 MG tablet Take 1 tablet (40 mg total) by mouth daily. 90 tablet 3  . ramipril (ALTACE) 10 MG capsule TAKE ONE CAPSULE BY MOUTH ONCE DAILY 30 capsule 5  . SPIRIVA HANDIHALER 18 MCG inhalation capsule INHALE ONE DOSE BY MOUTH ONCE DAILY 30 capsule 5  . SYMBICORT 160-4.5 MCG/ACT inhaler INHALE TWO PUFFS BY MOUTH EVERY 12 HOURS. RINSE MOUTH AFTER EACH USE 1 Inhaler 5   No current facility-administered medications on file prior to visit.       Review of Systems Constitutional:   No  weight loss, night sweats,  Fevers, chills, fatigue, or  lassitude.  HEENT:   No headaches,  Difficulty swallowing,  Tooth/dental problems, or  Sore throat,                No sneezing, itching, ear ache, + nasal congestion, post nasal drip,   CV:  No chest pain,  Orthopnea, PND, swelling in lower extremities, anasarca, dizziness, palpitations, syncope.   GI  No heartburn, indigestion, abdominal pain, nausea, vomiting, diarrhea, change in bowel habits, loss of appetite, bloody stools.   Resp:   No chest wall deformity  Skin: no rash or lesions.  GU: no dysuria, change in color of urine, no urgency or frequency.  No flank pain, no hematuria   MS:  No joint pain or swelling.  No decreased range of motion.  No back pain.  Psych:  No change in mood or affect. No depression or anxiety.  No memory loss.         Objective:    Physical Exam  Filed Vitals:   01/24/16 1412  BP: 140/70  Pulse: 77  Temp: 97.7 F (36.5 C)  TempSrc: Oral  Height: 5\' 10"  (1.778 m)  Weight: 183 lb (83.008 kg)  SpO2: 98%    GEN: A/Ox3; pleasant , NAD, elderly   HEENT:  [place]/AT,  EACs-clear, TMs-wnl, NOSE-clear, THROAT-clear, no lesions, no postnasal drip or exudate noted.   NECK:  Supple w/ fair ROM; no JVD; normal carotid impulses w/o bruits; no thyromegaly or nodules palpated; no lymphadenopathy.  RESP  Few trace exp wheezes , no accessory muscle use, no dullness to percussion  CARD:  RRR, no m/r/g  , no peripheral edema, pulses intact, no cyanosis or clubbing.  GI:   Soft & nt; nml bowel sounds; no organomegaly or masses detected.  Musco: Warm bil, no deformities or joint swelling noted.   Neuro: alert, no focal deficits noted.    Skin: Warm, no lesions or rashes  Tammy Parrett NP-C  [facility]  01/24/2016       Assessment & Plan:

## 2016-01-26 NOTE — Progress Notes (Signed)
Quick Note:  Called and spoke with pt. Reviewed results and recs. Pt voiced understanding and had no further questions. ______ 

## 2016-02-02 ENCOUNTER — Telehealth: Payer: Self-pay | Admitting: *Deleted

## 2016-02-02 NOTE — Telephone Encounter (Signed)
Reviewed information with Dr Marlou Porch.  While he is not convinced the cough is related to Altace he does get orders to stop it and try Losartan 50 mg a day.

## 2016-02-02 NOTE — Telephone Encounter (Signed)
I would be fine trying losartan 50 mg once a day just in case he has any semblance of ACE inhibitor cough.  Candee Furbish, MD

## 2016-02-02 NOTE — Telephone Encounter (Signed)
Pt walked in on 6/26 at 4:10 requesting "taking something besides Altace". Called to speak with patient who reports Dr [name] and his assistant told him his cough may be coming from taking altace.  He reports he has the cough with cold like s/s usually.  It has occurred mostly during the winter months and he feels like he "kept something all the time" this winter.  He reports he coughs occasionally, every now and then, some times it is productive if he has a cold or infection and sometimes it is non-productive.  He also reports sometimes he cough 3 or 4 times a night when he lays down to go to sleep.  Advised I will review with Dr Marlou Porch and call him back.

## 2016-02-03 MED ORDER — LOSARTAN POTASSIUM 50 MG PO TABS: 50.0000 mg | ORAL_TABLET | Freq: Every day | ORAL | Status: DC

## 2016-02-03 NOTE — Telephone Encounter (Signed)
Pt aware and new RX will be sent into pharmacy as instructed.

## 2016-02-17 ENCOUNTER — Telehealth: Payer: Self-pay | Admitting: Cardiology

## 2016-02-17 NOTE — Telephone Encounter (Signed)
Okay to proceed with surgery from a cardiology perspective. Be careful with pulmonary status, COPD.  Oscar Furbish, MD

## 2016-02-17 NOTE — Telephone Encounter (Signed)
Printed and taken to MR to be faxed 

## 2016-02-17 NOTE — Telephone Encounter (Signed)
New message      Request for surgical clearance:  1. What type of surgery is being performed? Left finger finger fasciexctomy  2. When is this surgery scheduled?  02-23-16  3. Are there any medications that need to be held prior to surgery and how long?  Cardiac clearance only  Name of physician performing surgery? Dr Leanora Cover 4. What is your office phone and fax number?  Fax [phone]

## 2016-02-23 ENCOUNTER — Other Ambulatory Visit: Payer: Self-pay | Admitting: Orthopedic Surgery

## 2016-03-02 ENCOUNTER — Ambulatory Visit: Payer: Medicare Other | Admitting: Emergency Medicine

## 2016-03-13 ENCOUNTER — Ambulatory Visit (INDEPENDENT_AMBULATORY_CARE_PROVIDER_SITE_OTHER): Payer: Medicare Other | Admitting: Emergency Medicine

## 2016-03-13 ENCOUNTER — Encounter: Payer: Self-pay | Admitting: Emergency Medicine

## 2016-03-13 DIAGNOSIS — I2510 Atherosclerotic heart disease of native coronary artery without angina pectoris: Secondary | ICD-10-CM

## 2016-03-13 DIAGNOSIS — J441 Chronic obstructive pulmonary disease with (acute) exacerbation: Secondary | ICD-10-CM | POA: Diagnosis not present

## 2016-03-13 DIAGNOSIS — G4733 Obstructive sleep apnea (adult) (pediatric): Secondary | ICD-10-CM

## 2016-03-13 NOTE — Patient Instructions (Addendum)
Please Spiriva and Symbicort as you are taking them  Take albuterol 2 puffs up to every 4 hours if needed for shortness of breath.  Get the flu shot in the Fall You pneumonia shots are up to date.  Follow with Dr [name] in 4 months or sooner if you have any problems.

## 2016-03-13 NOTE — Assessment & Plan Note (Signed)
Please Spiriva and Symbicort as you are taking them  Take albuterol 2 puffs up to every 4 hours if needed for shortness of breath.  Get the flu shot in the Fall You pneumonia shots are up to date.  Follow with Dr [name] in 4 months or sooner if you have any problems.

## 2016-03-13 NOTE — Assessment & Plan Note (Signed)
Not able to tolerate CPAP

## 2016-03-13 NOTE — Progress Notes (Signed)
Oscar Robinson is a 70 year old gentleman with known coronary artery disease status post CABG. Also has a history of significant tobacco abuse and airflow limitation on pulmonary function testing. He has a positive bronchodilator response. Severe COPD. He has OSA, not on CPAP.    ROV 08/23/15 -- Oscar Robinson has a history of COPD with an asthmatic component and bronchodilator response. He also has obstructive sleep apnea not on CPAP. He has been seen twice since our last visit once in December and once to weeks ago and treated with prednisone and antibiotics. He had been exposed to URI and caused apparent flares. He felt worse on the pred due to edema tremor etc.  He underwent a cardiac pulmonary exercise test 07/08/15 that was consistent with a ventilatory limitation and intact cardiac reserve. I reviewed personally and explained the results to him. He is on symbicort and spiriva. He has tried breo before - did mnot ultimately feel better on it.   ROV 11/04/15 -- patient with a history of COPD and positive bronchodilator response. He also has a history of sleep apnea that has not been treated with CPAP. We performed cardiopulmonary exercise testing that was consistent with a ventilatory limitation on his breathing and exertional tolerance. At his last visit we tried changing Spiriva plus Symbicort to Stiolto to see if he would benefit. He did not feel that it helped him as much as the prior combination, so he is now back to Spiriva and Symbicort. He has been able to be active. He was exposed to a URI recently, has started to have cough, sputum, no fevers, some fatigue and aches, possibly a chill.   ROV 03/13/16 --  Follow-up visit for patient with severe COPD and possible bronchodilator response, frequent acute exacerbations (at least 4 in the last year), coronary disease status post CABG, and obstructive sleep apnea not on CPAP.  He has had more sick exposures through work in the last year, believes that this has  contributed to his freq flares. He is on spiriva and symbicort. He has exertional dyspnea, is still able to exercise some but probably less than 4-5 yrs ago. He uses albuterol about 1-2x a week. Last flare was in June, better now after rx. Follows also w Dr Marlou Porch.    EXAM:  Vitals:   03/13/16 1326  BP: 130/70  BP Location: Left Arm  Cuff Size: Normal  Pulse: 80  SpO2: 98%  Weight: 188 lb (85.3 kg)  Height: 5\' 8"  (1.727 m)   Gen: Pleasant, well-nourished, in no distress,  normal affect  ENT: No lesions,  mouth clear,  oropharynx clear, no postnasal drip  Neck: No JVD, no TMG, no carotid bruits  Lungs: No use of accessory muscles, no wheezes  Cardiovascular: RRR, heart sounds normal, no murmur or gallops, no peripheral edema  Musculoskeletal: No deformities, no cyanosis or clubbing  Neuro: alert, non focal  Skin: Warm, no lesions or rashes   COPD (chronic obstructive pulmonary disease) Please Spiriva and Symbicort as you are taking them  Take albuterol 2 puffs up to every 4 hours if needed for shortness of breath.  Get the flu shot in the Fall You pneumonia shots are up to date.  Follow with Dr [name] in 4 months or sooner if you have any problems.  Obstructive sleep apnea Not able to tolerate CPAP    Baltazar Apo, MD, PhD 03/13/2016, 1:51 PM [facility] [phone] or if no answer [phone]

## 2016-04-03 ENCOUNTER — Other Ambulatory Visit: Payer: Self-pay | Admitting: Cardiology

## 2016-04-14 ENCOUNTER — Other Ambulatory Visit: Payer: Self-pay | Admitting: Emergency Medicine

## 2016-05-09 ENCOUNTER — Telehealth: Payer: Self-pay | Admitting: Emergency Medicine

## 2016-05-09 ENCOUNTER — Encounter: Payer: Self-pay | Admitting: Acute Care

## 2016-05-09 ENCOUNTER — Telehealth: Payer: Self-pay | Admitting: Acute Care

## 2016-05-09 ENCOUNTER — Ambulatory Visit (INDEPENDENT_AMBULATORY_CARE_PROVIDER_SITE_OTHER)
Admission: RE | Admit: 2016-05-09 | Discharge: 2016-05-09 | Disposition: A | Discharge status: Home / Self Care | Payer: Medicare Other | Source: Ambulatory Visit | Attending: Acute Care | Admitting: Acute Care

## 2016-05-09 ENCOUNTER — Ambulatory Visit (INDEPENDENT_AMBULATORY_CARE_PROVIDER_SITE_OTHER): Payer: Medicare Other | Admitting: Acute Care

## 2016-05-09 VITALS — BP 124/68 | HR 102 | Temp 98.6°F | Ht 68.0 in | Wt 186.0 lb

## 2016-05-09 DIAGNOSIS — J449 Chronic obstructive pulmonary disease, unspecified: Secondary | ICD-10-CM

## 2016-05-09 DIAGNOSIS — R0602 Shortness of breath: Secondary | ICD-10-CM

## 2016-05-09 DIAGNOSIS — I2510 Atherosclerotic heart disease of native coronary artery without angina pectoris: Secondary | ICD-10-CM

## 2016-05-09 LAB — NITRIC OXIDE: Nitric Oxide: 6

## 2016-05-09 MED ORDER — LEVALBUTEROL HCL 0.63 MG/3ML IN NEBU
0.6300 mg | INHALATION_SOLUTION | Freq: Once | RESPIRATORY_TRACT | Status: AC
Start: 2016-05-09 — End: 2016-05-09
  Administered 2016-05-09: 0.63 mg via RESPIRATORY_TRACT

## 2016-05-09 MED ORDER — PREDNISONE 10 MG PO TABS: ORAL_TABLET | ORAL | 0 refills | Status: DC

## 2016-05-09 NOTE — Telephone Encounter (Signed)
Please call Oscar Robinson and have him come in for an earlier follow up in 2 weeks to ensure he is improving. Thanks so much!!

## 2016-05-09 NOTE — Patient Instructions (Addendum)
It is nice to meet you today. CXR today. FENO today in the office  We will call you with results. Prednisone taper; 10 mg tablets: 4 tabs x 1 days, 3 tabs x 1 days, 2 tabs x 1 days 1 tab x 1 day then stop. Continue your Symbicort 2 puffs twice daily. Continue your Spiriva once daily. Continue your Albuterol as needed for shortness of breath or wheezing. Follow up with Oscar Robinson in December as is scheduled.  Please contact office for sooner follow up if symptoms do not improve or worsen or seek emergency care

## 2016-05-09 NOTE — Telephone Encounter (Signed)
Spoke with pt. He would like to be seen today. Pt has been scheduled to see SG today at 3:30pm. Nothing further was needed.

## 2016-05-09 NOTE — Assessment & Plan Note (Addendum)
Dyspnea after exposure to pollen 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 May be multifactorial; exposure/ COPD/Deconditioning. Plan: Neb treatment in the office today. Add Claritin 10 mg daily. CXR today. FENO today in the office  We will call you with results. Prednisone taper; 10 mg tablets: 4 tabs x 1 days, 3 tabs x 1 days, 2 tabs x 1 days 1 tab x 1 day then stop. Continue your Symbicort 2 puffs twice daily. Continue your Spiriva once daily. Use your rescue inhaler as needed for shortness of breath or wheezing as needed up to every 4-6 hours. Go to the hospital for worsening shortness of breath that does not resolve. Continue Nexium daily Follow up with [name] in December as is scheduled.  Please contact office for sooner follow up if symptoms do not improve or worsen or seek emergency care

## 2016-05-09 NOTE — Progress Notes (Signed)
History of Present Illness Oscar Robinson is a 70 y.o. 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  Patient has mild OSA not on CPAP. Patient has coronary artery disease status post CABG.   05/09/2016 Acute OV: Pt. Presents to the office today with increased shortness of breath x 1 day in duration. 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 He was wearing a mask but he felt he inhaled some of this. The shortness of breath started after exposure to the allergens.He states he could not breath well last night, and almost called 911. He used his albuterol inhaler x 1 last night.He states he is  better this afternoon.He does not have a cough,or sputum production.He denies chest pain, fever, orthopnea or hemoptysis.No leg pain or swelling.He is compliant with his Symbicort, Spiriva,and his Proventil.He is anxious.    Tests  FENO 05/07/2016>> 6 CXR 05/09/2016>> pending   Past medical hx Past Medical History:  Diagnosis Date  . Anxiety   . Arthritis   . COPD (chronic obstructive pulmonary disease) ([place])   . Coronary artery disease   . Depression   . Diabetes mellitus   . GERD (gastroesophageal reflux disease)   . Hypercholesteremia   . Tobacco abuse      Past surgical hx, Family hx, Social hx all reviewed.  Current Outpatient Prescriptions on File Prior to Visit  Medication Sig  . albuterol (PROAIR HFA) 108 (90 BASE) MCG/ACT inhaler Inhale 2 puffs into the lungs every 4 (four) hours as needed for wheezing or shortness of breath.  . ALPRAZolam (XANAX) 0.5 MG tablet Take 0.25 mg by mouth at bedtime.  Marland Kitchen amLODipine (NORVASC) 5 MG tablet Take 1 tablet (5 mg total) by mouth daily.  Marland Kitchen aspirin 81 MG tablet Take 162 mg by mouth daily.   . Coenzyme Q10 (CO Q 10) 100 MG CAPS Take 100 mg by mouth daily.  . colesevelam (WELCHOL) 625 MG tablet Take 3 tablets (1,875 mg total) by mouth 2 (two) times daily  with a meal.  . doxepin (SINEQUAN) 50 MG capsule Take 100 mg by mouth daily.  Marland Kitchen esomeprazole (NEXIUM) 40 MG capsule Take 1 capsule (40 mg total) by mouth daily at 12 noon.  Marland Kitchen HUMALOG 100 UNIT/ML injection With meals/has a pump  . losartan (COZAAR) 50 MG tablet Take 1 tablet (50 mg total) by mouth daily.  . metFORMIN (GLUCOPHAGE) 500 MG tablet Take 500 mg by mouth 2 (two) times daily.   . metoprolol succinate (TOPROL-XL) 50 MG 24 hr tablet TAKE ONE TABLET BY MOUTH ONCE DAILY WITH OR IMMEDIATELY FOLLOWING A MEAL  . Multiple Vitamin (MULTIVITAMIN) capsule Take 1 capsule by mouth daily.  . nitroGLYCERIN (NITROSTAT) 0.4 MG SL tablet Place 1 tablet (0.4 mg total) under the tongue every 5 (five) minutes as needed for chest pain.  . Omega-3 Fatty Acids (FISH OIL) 1200 MG CAPS Take 1 capsule by mouth 2 (two) times daily.  . pravastatin (PRAVACHOL) 40 MG tablet Take 1 tablet (40 mg total) by mouth daily.  Marland Kitchen SPIRIVA HANDIHALER 18 MCG inhalation capsule INHALE ONE DOSE BY MOUTH ONCE DAILY  . SYMBICORT 160-4.5 MCG/ACT inhaler INHALE TWO PUFFS BY MOUTH EVERY 12 HOURS. RINSE MOUTH AFTER EACH USE   No current facility-administered medications on file prior to visit.      No Known Allergies  Review Of Systems:  Constitutional:   No  weight loss, night sweats,  Fevers, chills, fatigue, or  lassitude.  HEENT:   No headaches,  Difficulty swallowing,  Tooth/dental problems, or  Sore throat,                + sneezing, no itching, ear ache, +nasal congestion, +post nasal drip,   CV:  No chest pain,  Orthopnea, PND, swelling in lower extremities, anasarca, dizziness, palpitations, syncope.   GI  No heartburn,+  Indigestion,no  abdominal pain, nausea, vomiting, diarrhea, change in bowel habits, loss of appetite, bloody stools.   Resp: + shortness of breath with exertion or at rest.  No excess mucus, no productive cough,  No non-productive cough,  No coughing up of blood.  No change in color of mucus.  No   wheezing.  No chest wall deformity  Skin: no rash or lesions.  GU: no dysuria, change in color of urine, no urgency or frequency.  No flank pain, no hematuria   MS:  No joint pain or swelling.  No decreased range of motion.  No back pain.  Psych:  No change in mood or affect. No depression or anxiety.  No memory loss.   Vital Signs BP 124/68 (BP Location: Left Arm, Cuff Size: Normal)   Pulse (!) 102   Temp 98.6 F (37 C) (Oral)   Ht 5\' 8"  (1.727 m)   Wt 186 lb (84.4 kg)   SpO2 98%   BMI 28.28 kg/m    Physical Exam:  General- No distress,  A&Ox3, anxious ENT: No sinus tenderness, TM clear, pale nasal mucosa, no oral exudate,no post nasal drip, no LAN Cardiac: S1, S2, regular rate and rhythm, no murmur Chest: + wheeze/no  rales/ dullness; no accessory muscle use, no nasal flaring, no sternal retractions Abd.: Soft Non-tender Ext: No clubbing cyanosis, trace edema Neuro:  normal strength Skin: No rashes, warm and dry Psych: normal mood and behavior   Assessment/Plan  Dyspnea Dyspnea after exposure to pollen 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 May be multifactorial; exposure/ COPD/Deconditioning. Plan: Neb treatment in the office today. Add Claritin 10 mg daily. CXR today. FENO today in the office  We will call you with results. Prednisone taper; 10 mg tablets: 4 tabs x 1 days, 3 tabs x 1 days, 2 tabs x 1 days 1 tab x 1 day then stop. Continue your Symbicort 2 puffs twice daily. Continue your Spiriva once daily. Use your rescue inhaler as needed for shortness of breath or wheezing as needed up to every 4-6 hours. Go to the hospital for worsening shortness of breath that does not resolve. Continue Nexium daily Follow up with [name] in December as is scheduled.  Please contact office for sooner follow up if symptoms do not improve or worsen or seek emergency care      Magdalen Spatz, NP 05/09/2016  10:03 PM

## 2016-05-10 NOTE — Telephone Encounter (Signed)
LVM for pt to return call

## 2016-05-10 NOTE — Telephone Encounter (Signed)
Pt returned call. Scheduled 2 week follow up with SG on 05/23/16 at 2:30. Pt voiced understanding and had no further questions.

## 2016-05-14 ENCOUNTER — Telehealth: Payer: Self-pay | Admitting: Emergency Medicine

## 2016-05-14 MED ORDER — LEVOFLOXACIN 500 MG PO TABS: 500.0000 mg | ORAL_TABLET | Freq: Every day | ORAL | 0 refills | Status: DC

## 2016-05-14 NOTE — Telephone Encounter (Signed)
RX sent to preferred pharmacy. Pt aware & voiced understanding. Nothing further needed.  

## 2016-05-14 NOTE — Telephone Encounter (Signed)
ATC pt , no answer  wcb 

## 2016-05-14 NOTE — Telephone Encounter (Signed)
Pt states that she feels that he has picked up a bug and feels like he may be getting sick again.  Pt having mucus production, dark in color([place]/brown), cough and little wheezing. Pt states that the symptoms started again 05/13/16. Completed the Prednisone on Saturday.  Taking all meds as directed.  Pt requesting something be called into [facility] in [place].  Pt wants to wait for RB to answer this afternoon.  Please advise Dr [name]. Thanks.   05/09/16 It is nice to meet you today. CXR today. FENO today in the office  We will call you with results. Prednisone taper; 10 mg tablets: 4 tabs x 1 days, 3 tabs x 1 days, 2 tabs x 1 days 1 tab x 1 day then stop. Continue your Symbicort 2 puffs twice daily. Continue your Spiriva once daily. Continue your Albuterol as needed for shortness of breath or wheezing. Follow up with [name] in December as is scheduled.  Please contact office for sooner follow up if symptoms do not improve or worsen or seek emergency care

## 2016-05-14 NOTE — Telephone Encounter (Signed)
Please have him take levaquin 500mg  qd x 7 days

## 2016-05-23 ENCOUNTER — Ambulatory Visit (INDEPENDENT_AMBULATORY_CARE_PROVIDER_SITE_OTHER): Payer: Medicare Other | Admitting: Acute Care

## 2016-05-23 ENCOUNTER — Encounter: Payer: Self-pay | Admitting: Acute Care

## 2016-05-23 VITALS — BP 112/60 | HR 81 | Ht 68.0 in | Wt 186.2 lb

## 2016-05-23 DIAGNOSIS — Z23 Encounter for immunization: Secondary | ICD-10-CM

## 2016-05-23 DIAGNOSIS — I2510 Atherosclerotic heart disease of native coronary artery without angina pectoris: Secondary | ICD-10-CM

## 2016-05-23 DIAGNOSIS — J449 Chronic obstructive pulmonary disease, unspecified: Secondary | ICD-10-CM

## 2016-05-23 MED ORDER — ALBUTEROL SULFATE (2.5 MG/3ML) 0.083% IN NEBU: 2.5000 mg | INHALATION_SOLUTION | Freq: Four times a day (QID) | RESPIRATORY_TRACT | 12 refills | Status: DC | PRN

## 2016-05-23 NOTE — Assessment & Plan Note (Signed)
Slow to resolve COPD Exacerbation Significantly improved after treatment with Levaquin 500 mg x 1 week and pred taper. Plan: Flu shot today. We will prescribe nebulizer  Treatments today. We will send an order for a nebulizer machine to your DME. We will send in an order for nebulizer treatments :  Albuterol up to every 6 hours as needed for shortness of breath or wheezing. Continue taking your Symbicort 2 puffs twice daily. Continue your Spiriva once daily. Use your rescue inhaler as needed for shortness of breath or wheezing as needed up to every 4-6 hours. Go to the hospital for worsening shortness of breath that does not resolve. Continue Nexium daily Follow up with [name] in December as is scheduled.  Please contact office for sooner follow up if symptoms do not improve or worsen or seek emergency care

## 2016-05-23 NOTE — Patient Instructions (Addendum)
It is good to see you today.  Flu shot today. We will prescribe nebulizer  Treatments today. We will send an order for a nebulizer machine to your DME. We will send in an order for nebulizer treatments :  Albuterol up to every 6 hours as needed for shortness of breath or wheezing. Continue taking your Symbicort 2 puffs twice daily. Continue your Spiriva once daily. Use your rescue inhaler as needed for shortness of breath or wheezing as needed up to every 4-6 hours. Go to the hospital for worsening shortness of breath that does not resolve. Continue Nexium daily Follow up with [name] in December as is scheduled.  Please contact office for sooner follow up if symptoms do not improve or worsen or seek emergency care

## 2016-05-23 NOTE — Progress Notes (Signed)
History of Present Illness Oscar Robinson is a 70 y.o. 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  Patient has mild OSA not on CPAP. Patient has coronary artery disease status post CABG.   05/23/2016 Acute Follow Up: Pr. Presents to the office for follow up of acute bronchitis.He was seen 05/09/2016 and was completing a course of prednisone when he started getting sick again. He called the office on 05/14/2016 and was prescribed Levaquin 500 mg x 7 days. He returns today stating that he is better and back to his baseline. He has a cough, which is his baseline, with clear secretions. His shortness of breath has improved and is almost back to baseline.  Tests 05/09/2016 IMPRESSION: No acute cardiopulmonary disease.  Past medical hx Past Medical History:  Diagnosis Date  . Anxiety   . Arthritis   . COPD (chronic obstructive pulmonary disease) ([place])   . Coronary artery disease   . Depression   . Diabetes mellitus   . GERD (gastroesophageal reflux disease)   . Hypercholesteremia   . Tobacco abuse      Past surgical hx, Family hx, Social hx all reviewed.  Current Outpatient Prescriptions on File Prior to Visit  Medication Sig  . albuterol (PROAIR HFA) 108 (90 BASE) MCG/ACT inhaler Inhale 2 puffs into the lungs every 4 (four) hours as needed for wheezing or shortness of breath.  . ALPRAZolam (XANAX) 0.5 MG tablet Take 0.25 mg by mouth at bedtime.  Marland Kitchen amLODipine (NORVASC) 5 MG tablet Take 1 tablet (5 mg total) by mouth daily.  Marland Kitchen aspirin 81 MG tablet Take 162 mg by mouth daily.   . Coenzyme Q10 (CO Q 10) 100 MG CAPS Take 100 mg by mouth daily.  . colesevelam (WELCHOL) 625 MG tablet Take 3 tablets (1,875 mg total) by mouth 2 (two) times daily with a meal.  . doxepin (SINEQUAN) 50 MG capsule Take 100 mg by mouth daily.  Marland Kitchen esomeprazole (NEXIUM) 40 MG capsule Take 1 capsule (40 mg total) by mouth daily at 12 noon.  Marland Kitchen HUMALOG 100 UNIT/ML injection With  meals/has a pump  . losartan (COZAAR) 50 MG tablet Take 1 tablet (50 mg total) by mouth daily.  . metFORMIN (GLUCOPHAGE) 500 MG tablet Take 500 mg by mouth 2 (two) times daily.   . metoprolol succinate (TOPROL-XL) 50 MG 24 hr tablet TAKE ONE TABLET BY MOUTH ONCE DAILY WITH OR IMMEDIATELY FOLLOWING A MEAL  . Multiple Vitamin (MULTIVITAMIN) capsule Take 1 capsule by mouth daily.  . nitroGLYCERIN (NITROSTAT) 0.4 MG SL tablet Place 1 tablet (0.4 mg total) under the tongue every 5 (five) minutes as needed for chest pain.  . Omega-3 Fatty Acids (FISH OIL) 1200 MG CAPS Take 1 capsule by mouth 2 (two) times daily.  . pravastatin (PRAVACHOL) 40 MG tablet Take 1 tablet (40 mg total) by mouth daily.  Marland Kitchen SPIRIVA HANDIHALER 18 MCG inhalation capsule INHALE ONE DOSE BY MOUTH ONCE DAILY  . SYMBICORT 160-4.5 MCG/ACT inhaler INHALE TWO PUFFS BY MOUTH EVERY 12 HOURS. RINSE MOUTH AFTER EACH USE   No current facility-administered medications on file prior to visit.      No Known Allergies  Review Of Systems:  Constitutional:   No  weight loss, night sweats,  Fevers, chills, fatigue, or  lassitude.  HEENT:   No headaches,  Difficulty swallowing,  Tooth/dental problems, or  Sore throat,  No sneezing, itching, ear ache,+ nasal congestion, post nasal drip,   CV:  No chest pain,  Orthopnea, PND, swelling in lower extremities, anasarca, dizziness, palpitations, syncope.   GI  No heartburn, indigestion, abdominal pain, nausea, vomiting, diarrhea, change in bowel habits, loss of appetite, bloody stools.   Resp: + but improving shortness of breath with exertion less at rest.  No excess mucus, + productive cough,  No non-productive cough,  No coughing up of blood.  No change in color of mucus.  No wheezing.  No chest wall deformity  Skin: no rash or lesions.  GU: no dysuria, change in color of urine, no urgency or frequency.  No flank pain, no hematuria   MS:  No joint pain or swelling.  No decreased  range of motion.  No back pain.  Psych:  No change in mood or affect. No depression or anxiety.  No memory loss.   Vital Signs BP 112/60 (BP Location: Left Arm, Cuff Size: Normal)   Pulse 81   Ht 5\' 8"  (1.727 m)   Wt 186 lb 3.2 oz (84.5 kg)   SpO2 100%   BMI 28.31 kg/m    Physical Exam:  General- No distress,  A&Ox3, pleasant ENT: No sinus tenderness, TM clear, pale nasal mucosa, no oral exudate,no post nasal drip, no LAN Cardiac: S1, S2, regular rate and rhythm, no murmur Chest: No wheeze/ rales/ diminished per bases; no accessory muscle use, no nasal flaring, no sternal retractions Abd.: Soft Non-tender Ext: No clubbing cyanosis, edema Neuro:  normal strength Skin: No rashes, warm and dry Psych: normal mood and behavior   Assessment/Plan  COPD (chronic obstructive pulmonary disease) Slow to resolve COPD Exacerbation Significantly improved after treatment with Levaquin 500 mg x 1 week and pred taper. Plan: Flu shot today. We will prescribe nebulizer  Treatments today. We will send an order for a nebulizer machine to your DME. We will send in an order for nebulizer treatments :  Albuterol up to every 6 hours as needed for shortness of breath or wheezing. Continue taking your Symbicort 2 puffs twice daily. Continue your Spiriva once daily. Use your rescue inhaler as needed for shortness of breath or wheezing as needed up to every 4-6 hours. Go to the hospital for worsening shortness of breath that does not resolve. Continue Nexium daily Follow up with [name] in December as is scheduled.  Please contact office for sooner follow up if symptoms do not improve or worsen or seek emergency care      Magdalen Spatz, NP 05/23/2016  3:09 PM

## 2016-05-25 ENCOUNTER — Telehealth: Payer: Self-pay | Admitting: Acute Care

## 2016-05-25 NOTE — Telephone Encounter (Signed)
Called and spoke with the pharmacist and she stated that [employer] will not allow them to fill the albuterol with the as needed in the sig.  This has been changed and she noted the account so they could ship this out to the pt.    They did fax over a form that needs to be signed by SG.  Will forward to lindsay to follow up on form.

## 2016-05-28 ENCOUNTER — Ambulatory Visit: Payer: Medicare Other | Admitting: Cardiology

## 2016-05-28 MED ORDER — ALBUTEROL SULFATE (2.5 MG/3ML) 0.083% IN NEBU: 2.5000 mg | INHALATION_SOLUTION | Freq: Four times a day (QID) | RESPIRATORY_TRACT | 12 refills | Status: DC

## 2016-05-28 NOTE — Telephone Encounter (Signed)
Ria Comment did you receive the form that was supposed to be faxed?

## 2016-05-28 NOTE — Telephone Encounter (Signed)
This form has not been received. A new prescription has been sent [place] with the "prn" on the prescription.

## 2016-05-28 NOTE — Progress Notes (Deleted)
[facility]. [address], [place]  [address] Phone: [phone] Fax:  [phone]  Date:  05/28/2016   ID:  Oscar Robinson, DOB [birth year], MRN [id number]  PCP:  [name], MD   History of Present Illness: Oscar Robinson is a 70 y.o. male with coronary artery disease status post bypass surgery 1999, significant tobacco use, COPD with shortness of breath, dyspnea on exertion here for follow up . [name] pulmonary.  Prior to CABG had symptoms of dyspnea mostly. Fatigue.    06/11/13 he underwent nuclear stress test where he had marked ST segment depression diffusely during exercise, relieved with rest as well as marked shortness of breath, pale. Blood pressure also decreased during exercise. Subsequently had cath (see below)  07/10/13 -native vessel LAD/diagonal bifurcation disease with patent LIMA to LAD/diagonal albeit small caliber vessel. Normal perfusion pattern on nuclear stress test. My concern previously was ST segment depression as well as decrease in blood pressure during exercise. This is described above. Ejection fraction 60%. We decided to continue with medical management. Metoprolol, amlodipine, pravastatin. No PCI.  11/18/13-he has been having some complaints of dysphagia, food getting stuck in his upper throat. One of his friends died of esophageal cancer. He is a former smoker. He still having shortness of breath. Getting treated for COPD by [name]. Prior cardiac catheterization reviewed with him.   11/17/14- still having dyspnea on exertion. No significant changes however over the past year. He enjoys breeding dogs. He is quite active. In review of [name] note, consideration to pulmonary rehabilitation in future. Prior cardiac catheterization reviewed once again.   05/16/15- recent episode of severe shortness of breath. Woke up, thought he was going to die. Currently on prednisone taper. Having wild dreams. Lower extremity edema noted.  Weight is slightly increased. He is worried about taking doxepin long-term. Would like to talk to a psychiatrist regarding this. He also is worried about undergoing cardiopulmonary exercise test as he "went out" with previous study. Glucose was 26 after he just started his insulin pump.  12/05/15-overall feeling okay. Shortness of breath chronic from COPD. Has been seeing [name], [name]. Underwent cardiopulmonary exercise test on 07/08/15 which showed fairly significant ventilatory obstruction. Chest x-ray on 07/21/15 shows COPD without any evidence of pneumonia. No chest pain. Does well.  Wt Readings from Last 3 Encounters:  05/23/16 186 lb 3.2 oz (84.5 kg)  05/09/16 186 lb (84.4 kg)  03/13/16 188 lb (85.3 kg)     Past Medical History:  Diagnosis Date  . Anxiety   . Arthritis   . COPD (chronic obstructive pulmonary disease) ([place])   . Coronary artery disease   . Depression   . Diabetes mellitus   . GERD (gastroesophageal reflux disease)   . Hypercholesteremia   . Tobacco abuse     Past Surgical History:  Procedure Laterality Date  . CORONARY ARTERY BYPASS GRAFT  1999  . 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 HAND SURGERY     lt palm,ring finger,  . HERNIA REPAIR  1996   lt/rt ing hernia  . LEFT HEART CATHETERIZATION WITH CORONARY/GRAFT ANGIOGRAM N/A 06/15/2013   Procedure: LEFT HEART CATHETERIZATION WITH Beatrix Fetters;  Surgeon: Candee Furbish, MD;  Location: [facility] CATH LAB;  Service: Cardiovascular;  Laterality: N/A;    Current Outpatient Prescriptions  Medication Sig Dispense Refill  . albuterol (PROAIR HFA) 108 (90 BASE) MCG/ACT inhaler Inhale 2 puffs into the lungs every 4 (four) hours as needed for wheezing or shortness of breath. 1 Inhaler 3  .  albuterol (PROVENTIL) (2.5 MG/3ML) 0.083% nebulizer solution Take 3 mLs (2.5 mg total) by nebulization every 6 (six) hours as needed for wheezing or shortness of breath. 360 mL 12  . ALPRAZolam (XANAX) 0.5 MG tablet Take 0.25 mg by mouth at bedtime.    Marland Kitchen amLODipine (NORVASC) 5 MG tablet Take 1 tablet (5 mg total) by mouth daily. 30 tablet 11  . aspirin 81 MG tablet Take 162 mg by mouth daily.     . Coenzyme Q10 (CO Q 10) 100 MG CAPS Take 100 mg by mouth daily.    . colesevelam (WELCHOL) 625 MG tablet Take 3 tablets (1,875 mg total) by mouth 2 (two) times daily with a meal. 180 tablet 11  . doxepin (SINEQUAN) 50 MG capsule Take 100 mg by mouth daily.    Marland Kitchen esomeprazole (NEXIUM) 40 MG capsule Take 1 capsule (40 mg total) by mouth daily at 12 noon. 30 capsule 6  . HUMALOG 100 UNIT/ML injection With meals/has a pump    . losartan (COZAAR) 50 MG tablet Take 1 tablet (50 mg total) by mouth daily. 90 tablet 3  . metFORMIN (GLUCOPHAGE) 500 MG tablet Take 500 mg by mouth 2 (two) times daily.     . metoprolol succinate (TOPROL-XL) 50 MG 24 hr tablet TAKE ONE TABLET BY MOUTH ONCE DAILY WITH OR IMMEDIATELY FOLLOWING A MEAL 30 tablet 11  . Multiple Vitamin (MULTIVITAMIN) capsule Take 1 capsule by mouth daily.    . nitroGLYCERIN (NITROSTAT) 0.4 MG SL tablet Place 1 tablet (0.4 mg total) under the tongue every 5 (five) minutes as needed for chest pain. 30 tablet 5  . Omega-3 Fatty Acids (FISH OIL) 1200 MG CAPS Take 1 capsule by mouth 2 (two) times daily.    . pravastatin (PRAVACHOL) 40 MG tablet Take 1 tablet (40 mg total) by mouth daily. 90 tablet 3  . SPIRIVA HANDIHALER 18 MCG inhalation capsule INHALE ONE DOSE BY MOUTH ONCE DAILY 30 capsule 5  . SYMBICORT 160-4.5 MCG/ACT inhaler INHALE TWO PUFFS BY MOUTH EVERY 12 HOURS. RINSE MOUTH AFTER EACH USE 1 Inhaler 5   No current facility-administered medications for this visit.     Allergies:   No Known Allergies  Social History:  The patient  reports that he  quit smoking about 18 years ago. His smoking use included Cigarettes. He has a 70.00 pack-year smoking history. He has never used smokeless tobacco. He reports that he drinks alcohol. He reports that he does not use drugs.   Family History  Problem Relation Age of Onset  . Drug abuse Brother     Cocaine  . Heart disease Neg Hx   . Heart failure Neg Hx   . Diabetes Neg Hx   . Hypertension Neg Hx   . Colon cancer Neg Hx   . Esophageal cancer Neg Hx   . Prostate cancer Neg Hx   . Pancreatic cancer Neg Hx   . Kidney disease Neg Hx   . Liver disease Neg Hx     ROS:  Please see the history of present illness. Denies any bleeding, syncope, stroke like symptoms, dysphagia, orthopnea, PND. No rashes or   All other systems reviewed and negative.  PHYSICAL EXAM: VS:  There were no vitals taken for this visit. Well nourished, well developed, in no acute distress  HEENT: normal, [place]/AT, EOMI Neck: no JVD, normal carotid upstroke, no bruit Cardiac:  normal S1, S2; RRR; no murmur  Lungs:  clear to auscultation bilaterally, mild wheezing, no rhonchi or rales although he does have prolonged expiratory phase/distant breath sounds noted Abd: soft, nontender, no hepatomegaly, no bruits  Ext: no edema, 2+ distal pulses Skin: warm and dry  GU: deferred Neuro: no focal abnormalities noted, AAO x 3, mildly anxious  EKG:   EKG was done today. 12/05/15-sinus rhythm, 86, no other significant changes. Nonspecific ST-T wave changes.  11/17/14-normal rhythm, no other abnormalities, very subtle nonspecific T-wave changes in aVL.sinus rhythm, 73 with nonspecific ST-T wave changes    Cardiac cath 06/15/13:  LEFT VENTRICULOGRAM:  Left ventricular angiogram was done in the 30 RAO projection and revealed normal left ventricular wall motion and systolic function with an estimated ejection fraction of 60%.   IMPRESSIONS:  1. Native vessel LAD/diagonal bifurcation disease with patent LIMA to LAD/diagonal albeit  small caliber vessel. Normal perfusion pattern on nuclear stress. (Concern was ST segment depression as well as decrease in blood pressure during exercise, dyspnea)  2. Normal left ventricular systolic function.  LVEDP 10 mmHg.  Ejection fraction 60%.  RECOMMENDATION:  Continue with medical management. Promoting exercise, antianginal medications continue with metoprolol, amlodipine. Pravastatin. With lack of perfusion abnormality, I would not promote PCI of native LAD or angioplasty of diagonal branch at this juncture.    ASSESSMENT AND PLAN:  Shortness of breath-dyspnea on exertion was a previous anginal equivalent for him prior to his bypass surgery in 1999. Cardiac catheterization 2014 as above. Reassuring. It is likely that his underlying COPD as driving the majority of his dyspnea. (49% FEV1) Continue with medical management. Continue with current aggressive secondary prevention. Thankfully, nuclear stress perfusion did not show any significant defects. He is currently taking prednisone.  COPD-[name]. Medications reviewed.   Hypertension-overall well controlled.  Angina-nitroglycerin refilled previously.  Coronary artery disease-as described above-prior CABG 1999. Checking echocardiogram.  hyperlipidemia-excellent lipids previously. Multidrug regimen noted as above.  Diabetes-insulin-dependent, primary physician.  psychiatry- depression- gave him Dr. Starleen Arms name previously. I don't think that he ended up talking with him.  He has been on doxepin for several years.   We will see him back in 6 months at his request  Signed, Candee Furbish, MD [facility]  05/28/2016 6:54 AM

## 2016-05-29 ENCOUNTER — Other Ambulatory Visit: Payer: Self-pay | Admitting: Cardiology

## 2016-05-30 NOTE — Progress Notes (Signed)
Cardiology Office Note   Date:  05/31/2016   ID:  Oscar Robinson, DOB [birth year], MRN [id number]  PCP:  [name], MD  Cardiologist:  Dr. Marlou Porch     Chief Complaint  Patient presents with  . Coronary Artery Disease      History of Present Illness: Oscar Robinson is a 71 y.o. male who presents for CAD follow up.   He has a hx of coronary artery disease status post bypass surgery 1999, significant tobacco use, COPD with recent increasing shortness of breath,  06/11/13 he underwent nuclear stress test where he had marked ST segment depression diffusely during exercise, relieved with rest as well as marked shortness of breath, pale. Blood pressure also decreased during exercise.  07/10/13 -native vessel LAD/diagonal bifurcation disease with patent LIMA to LAD/diagonal albeit small caliber vessel. Normal perfusion pattern on nuclear stress test. My concern previously was ST segment depression as well as decrease in blood pressure during exercise. This is described above. Ejection fraction 60%. We decided to continue with medical management. Metoprolol, amlodipine, pravastatin. No PCI.    Last visit "12/05/15-overall feeling okay. Shortness of breath chronic from COPD. Has been seeing [name], [name]. Underwent cardiopulmonary exercise test on 07/08/15 which showed fairly significant ventilatory obstruction. Chest x-ray on 07/21/15 shows COPD without any evidence of pneumonia."  Today recent bronchitis. He had run out of nexium and was taking rolaids, but indigestion continued once back on nexium no further pain.  No chest pain. Episodic SOB.  Pt mostly worried about his memory.  He has to write everything down.  He forgot appointment recently and is very upset.  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  He does pay his bills and takes his meds.  His BP is elevated today but on all other recent visits BP was well controlled.  He will check BP for a week and let us know if >  [id number] systolic.    Past Medical History:  Diagnosis Date  . Anxiety   . Arthritis   . COPD (chronic obstructive pulmonary disease) ([place])   . Coronary artery disease   . Depression   . Diabetes mellitus   . GERD (gastroesophageal reflux disease)   . Hypercholesteremia   . Tobacco abuse     Past Surgical History:  Procedure Laterality Date  . CORONARY ARTERY BYPASS GRAFT  1999  . 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 HAND SURGERY     lt palm,ring finger,  . HERNIA REPAIR  1996   lt/rt ing hernia  . LEFT HEART CATHETERIZATION WITH CORONARY/GRAFT ANGIOGRAM N/A 06/15/2013   Procedure: LEFT HEART CATHETERIZATION WITH Beatrix Fetters;  Surgeon: Candee Furbish, MD;  Location[facility] C[facility] LAB;  Service: Cardiovascular;  Laterality: N/A;     Current Outpatient Prescriptions  Medication Sig Dispense Refill  . albuterol (PROAIR HFA) 108 (90 BASE) MCG/ACT inhaler Inhale 2 puffs into the lungs every 4 (four) hours as needed for wheezing or shortness of breath. 1 Inhaler 3  . albuterol (PROVENTIL) (2.5 MG/3ML) 0.083% nebulizer solution Take 3 mLs (2.5 mg total) by nebulization every 6 (six) hours. 360 mL 12  . ALPRAZolam (XANAX) 0.5 MG tablet Take 0.25 mg by mouth at bedtime.    Marland Kitchen amLODipine (NORVASC) 5 MG tablet  TAKE ONE TABLET BY MOUTH ONCE DAILY 90 tablet 1  . aspirin 81 MG tablet Take 162 mg by mouth daily.     . Coenzyme Q10 (CO Q 10) 100 MG CAPS Take 100 mg by mouth daily.    . colesevelam (WELCHOL) 625 MG tablet Take 3 tablets (1,875 mg total) by mouth 2 (two) times daily with a meal. 180 tablet 11  . doxepin (SINEQUAN) 50 MG capsule Take 100 mg by mouth daily.    Marland Kitchen esomeprazole (NEXIUM) 40 MG capsule Take 1 capsule (40 mg total) by mouth daily at 12 noon. 30  capsule 6  . HUMALOG 100 UNIT/ML injection With meals/has a pump    . losartan (COZAAR) 50 MG tablet Take 1 tablet (50 mg total) by mouth daily. 90 tablet 3  . metFORMIN (GLUCOPHAGE) 500 MG tablet Take 500 mg by mouth 2 (two) times daily.     . metoprolol succinate (TOPROL-XL) 50 MG 24 hr tablet TAKE ONE TABLET BY MOUTH ONCE DAILY WITH OR IMMEDIATELY FOLLOWING A MEAL 30 tablet 11  . Multiple Vitamin (MULTIVITAMIN) capsule Take 1 capsule by mouth daily.    . nitroGLYCERIN (NITROSTAT) 0.4 MG SL tablet Place 1 tablet (0.4 mg total) under the tongue every 5 (five) minutes as needed for chest pain. 30 tablet 5  . Omega-3 Fatty Acids (FISH OIL) 1200 MG CAPS Take 1 capsule by mouth 2 (two) times daily.    . pravastatin (PRAVACHOL) 40 MG tablet Take 1 tablet (40 mg total) by mouth daily. 90 tablet 3  . SPIRIVA HANDIHALER 18 MCG inhalation capsule INHALE ONE DOSE BY MOUTH ONCE DAILY 30 capsule 5  . SYMBICORT 160-4.5 MCG/ACT inhaler INHALE TWO PUFFS BY MOUTH EVERY 12 HOURS. RINSE MOUTH AFTER EACH USE 1 Inhaler 5   No current facility-administered medications for this visit.     Allergies:   Review of patient's allergies indicates no known allergies.    Social History:  The patient  reports that he quit smoking about 18 years ago. His smoking use included Cigarettes. He has a 70.00 pack-year smoking history. He has never used smokeless tobacco. He reports that he drinks alcohol. He reports that he does not use drugs.   Family History:  The patient's family history includes Drug abuse in his brother.    ROS:  General:no colds or fevers, no weight changes Skin:no rashes or ulcers HEENT:no blurred vision, no congestion CV:see HPI PUL:see HPI GI:no diarrhea constipation or melena, no indigestion GU:no hematuria, no dysuria MS:no joint pain, no claudication Neuro:no syncope, no lightheadedness, forgetfullness Endo:+ diabetes stable, no thyroid disease  Wt Readings from Last 3 Encounters:  05/31/16  191 lb 6.4 oz (86.8 kg)  05/23/16 186 lb 3.2 oz (84.5 kg)  05/09/16 186 lb (84.4 kg)     PHYSICAL EXAM: VS:  BP (!) 152/72   Pulse 76   Ht 5\' 8"  (1.727 m)   Wt 191 lb 6.4 oz (86.8 kg)   SpO2 99%   BMI 29.10 kg/m  , BMI Body mass index is 29.1 kg/m. General:Pleasant affect, NAD Skin:Warm and dry, brisk capillary refill HEENT:normocephalic, sclera clear, mucus membranes moist Neck:supple, no JVD, no bruits  Heart:S1S2 RRR without murmur, gallup, rub or click Lungs:clear without rales, rhonchi, or wheezes [id number], non tender, + BS, do not palpate liver spleen or masses Ext:no lower ext edema, 2+ pedal pulses, 2+ radial pulses Neuro:alert and oriented X 3, MAE, follows commands, + facial symmetry    EKG:  EKG is  NOT ordered today.    Recent Labs: No results found for requested labs within last 8760 hours.    Lipid Panel    Component Value Date/Time   CHOL 124 07/10/2013 1014   TRIG 77.0 07/10/2013 1014   HDL 50.00 07/10/2013 1014   CHOLHDL 2 07/10/2013 1014   VLDL 15.4 07/10/2013 1014   LDLCALC 59 07/10/2013 1014       Other studies Reviewed: Additional studies/ records that were reviewed today include: . Echo 12/2015 Study Conclusions  - Left ventricle: The cavity size was normal. Systolic function was   normal. The estimated ejection fraction was in the range of 55%   to 60%. Wall motion was normal; there were no regional wall   motion abnormalities. Left ventricular diastolic function   parameters were normal. - Mitral valve: Systolic bowing without prolapse.   ASSESSMENT AND PLAN:  1.  CAD- no angina, + indigestion off nexium.  2.  HTN elevated today, but previous recent OV stable BP, he will keep a log daily and call if systolic BP > [id number]  3.  COPD followed by Pulmonary  4.  Hyperlipidemia, will check in next 1-2 weeks  5.  Memory issues will check labs, if normal will reuer to Neurology for eval.    Follow up with Dr. Marlou Porch in 6 months  unless chest pain or other issues.   Current medicines are reviewed with the patient today.  The patient Has no concerns regarding medicines.  The following changes have been made:  See above Labs/ tests ordered today include:see above  Disposition:   FU:  see above  Signed, Cecilie Kicks, NP  05/31/2016 9:48 PM    [facility] [place], [place] [place], [place] Phone: [phone]; Fax: [phone]

## 2016-05-31 ENCOUNTER — Ambulatory Visit (INDEPENDENT_AMBULATORY_CARE_PROVIDER_SITE_OTHER): Payer: Medicare Other | Admitting: Cardiology

## 2016-05-31 ENCOUNTER — Encounter: Payer: Self-pay | Admitting: Cardiology

## 2016-05-31 VITALS — BP 152/72 | HR 76 | Ht 68.0 in | Wt 191.4 lb

## 2016-05-31 DIAGNOSIS — I2510 Atherosclerotic heart disease of native coronary artery without angina pectoris: Secondary | ICD-10-CM

## 2016-05-31 DIAGNOSIS — R413 Other amnesia: Secondary | ICD-10-CM | POA: Diagnosis not present

## 2016-05-31 DIAGNOSIS — E7800 Pure hypercholesterolemia, unspecified: Secondary | ICD-10-CM | POA: Diagnosis not present

## 2016-05-31 DIAGNOSIS — I10 Essential (primary) hypertension: Secondary | ICD-10-CM

## 2016-05-31 DIAGNOSIS — R5383 Other fatigue: Secondary | ICD-10-CM | POA: Diagnosis not present

## 2016-05-31 NOTE — Patient Instructions (Addendum)
Medication Instructions:  Your physician recommends that you continue on your current medications as directed. Please refer to the Current Medication list given to you today.   Labwork: 1 WEEK:  FASTING LIPID, CMP, CBC, B12, FOLATE, & TSH  Testing/Procedures: None ordered  Follow-Up: Your physician wants you to follow-up in: [place] will receive a reminder letter in the mail two months in advance. If you don't receive a letter, please call our office to schedule the follow-up appointment.   Any Other Special Instructions Will Be Listed Below (If Applicable).  1.  Check your blood pressure 1 X a daily for 1 week and call our office if it is higher than 130 on the top 2.  Have your friend, Mr. Marcello Moores, call and make an appointment.    If you need a refill on your cardiac medications before your next appointment, please call your pharmacy.

## 2016-06-05 ENCOUNTER — Other Ambulatory Visit: Payer: Medicare Other | Admitting: *Deleted

## 2016-06-05 DIAGNOSIS — R5383 Other fatigue: Secondary | ICD-10-CM

## 2016-06-05 DIAGNOSIS — E7800 Pure hypercholesterolemia, unspecified: Secondary | ICD-10-CM

## 2016-06-05 DIAGNOSIS — R413 Other amnesia: Secondary | ICD-10-CM

## 2016-06-05 LAB — CBC
HEMATOCRIT: 30.2 % — AB (ref 38.5–50.0)
Hemoglobin: 8.8 g/dL — ABNORMAL LOW (ref 13.2–17.1)
MCH: 21.3 pg — ABNORMAL LOW (ref 27.0–33.0)
MCHC: 29.1 g/dL — ABNORMAL LOW (ref 32.0–36.0)
MCV: 72.9 fL — AB (ref 80.0–100.0)
MPV: 9.5 fL (ref 7.5–12.5)
PLATELETS: 332 10*3/uL (ref 140–400)
RBC: 4.14 MIL/uL — AB (ref 4.20–5.80)
RDW: 18.5 % — AB (ref 11.0–15.0)
WBC: 6 10*3/uL (ref 3.8–10.8)

## 2016-06-05 LAB — COMPREHENSIVE METABOLIC PANEL
ALBUMIN: 3.9 g/dL (ref 3.6–5.1)
ALT: 15 U/L (ref 9–46)
AST: 19 U/L (ref 10–35)
Alkaline Phosphatase: 45 U/L (ref 40–115)
BUN: 22 mg/dL (ref 7–25)
CALCIUM: 9.3 mg/dL (ref 8.6–10.3)
CHLORIDE: 103 mmol/L (ref 98–110)
CO2: 25 mmol/L (ref 20–31)
CREATININE: 1.26 mg/dL — AB (ref 0.70–1.25)
Glucose, Bld: 140 mg/dL — ABNORMAL HIGH (ref 65–99)
Potassium: 4.7 mmol/L (ref 3.5–5.3)
Sodium: 136 mmol/L (ref 135–146)
TOTAL PROTEIN: 6.3 g/dL (ref 6.1–8.1)
Total Bilirubin: 0.4 mg/dL (ref 0.2–1.2)

## 2016-06-05 LAB — TSH: TSH: 4.56 mIU/L — ABNORMAL HIGH (ref 0.40–4.50)

## 2016-06-05 LAB — LIPID PANEL
CHOL/HDL RATIO: 2.2 ratio (ref <=5.0)
CHOLESTEROL: 127 mg/dL (ref 125–200)
HDL: 58 mg/dL (ref >=40)
LDL Cholesterol: 51 mg/dL (ref <130)
TRIGLYCERIDES: 89 mg/dL (ref <150)
VLDL: 18 mg/dL (ref <30)

## 2016-06-05 LAB — VITAMIN B12: VITAMIN B 12: 399 pg/mL (ref 200–1100)

## 2016-06-06 LAB — FOLATE: Folate: >24 ng/mL (ref >5.4)

## 2016-06-07 ENCOUNTER — Other Ambulatory Visit: Payer: Self-pay | Admitting: Cardiology

## 2016-06-13 ENCOUNTER — Telehealth: Payer: Self-pay | Admitting: *Deleted

## 2016-06-13 DIAGNOSIS — R413 Other amnesia: Secondary | ICD-10-CM

## 2016-06-13 NOTE — Telephone Encounter (Signed)
-----   Message from Isaiah Serge, NP sent at 06/05/2016  4:28 PM EDT ----- Labs normal, thyroid very mildly elevated but not enough to treat.  BP readings are elvated, increase the amlodipine to 10 mg daily and please arrange Neuro appt for memory loss.  Thanks.

## 2016-06-13 NOTE — Telephone Encounter (Signed)
Pt decided to have a out pt ref to Neuro for memory loss.  Referral put in EPIC.

## 2016-06-14 ENCOUNTER — Telehealth: Payer: Self-pay | Admitting: Emergency Medicine

## 2016-06-14 ENCOUNTER — Ambulatory Visit (INDEPENDENT_AMBULATORY_CARE_PROVIDER_SITE_OTHER): Payer: Medicare Other | Admitting: Nurse Practitioner

## 2016-06-14 ENCOUNTER — Encounter: Payer: Self-pay | Admitting: Nurse Practitioner

## 2016-06-14 VITALS — BP 140/78 | HR 82 | Ht 69.0 in | Wt 187.0 lb

## 2016-06-14 DIAGNOSIS — D509 Iron deficiency anemia, unspecified: Secondary | ICD-10-CM | POA: Diagnosis not present

## 2016-06-14 DIAGNOSIS — I2510 Atherosclerotic heart disease of native coronary artery without angina pectoris: Secondary | ICD-10-CM

## 2016-06-14 MED ORDER — NA SULFATE-K SULFATE-MG SULF 17.5-3.13-1.6 GM/177ML PO SOLN: 1.0000 | ORAL | 0 refills | Status: DC

## 2016-06-14 NOTE — Progress Notes (Signed)
     HPI: Patient is a 70 year old male with COPD,  DM, CAD, status post remote bypass. He is known to Dr. Ardis Hughs for a history of adenomatous colon polyps June 2015. Patient referred by PCP Dr. Cecille Amsterdam, for evaluation of anemia. Hemoglobin November 2014 was 11.4, now down to 8.8 with MCV of 73 . Ferritin is 8. Patient has some memory deficits but feels pretty certain he takes Gabriel Earing powders less than once a month for headaches. He takes 2 baby aspirin every day. No other NSAIDs. Patient has no abdominal pain, nausea, or bowel changes. Over the last couple of months patient has found it increasingly difficult to exercise which she typically does 5 times a day. He is short of breath and complains of fatigue with exertion  Past Medical History:  Diagnosis Date  . Anemia   . Anxiety   . Arthritis   . COPD (chronic obstructive pulmonary disease) ([place])   . Coronary artery disease   . Depression   . Diabetes mellitus   . GERD (gastroesophageal reflux disease)   . Hypercholesteremia   . Thyroid disease   . Tobacco abuse     Patient's surgical history, family medical history, social history, medications and allergies were all reviewed in Epic    Physical Exam: BP 140/78   Pulse 82   Ht 5\' 9"  (1.753 m)   Wt 187 lb (84.8 kg)   BMI 27.62 kg/m   GENERAL: well developed white male  in NAD PSYCH: :Pleasant, cooperative, normal affect HEENT: Normocephalic, conjunctiva pink, mucous membranes moist, neck supple without masses CARDIAC:  RRR,  No murmur heard, no peripheral edema PULM: Normal respiratory effort, lungs CTA bilaterally, no wheezing ABDOMEN:  soft, nontender, nondistended, no obvious masses, no hepatomegaly,  normal bowel sounds Rectal : heme negative SKIN:  turgor, no lesions seen Musculoskeletal:  Normal muscle tone, normal strength NEURO: Alert and oriented x 3, he has some memory deficits  ASSESSMENT and PLAN:  1. 40 year of male with new iron deficiency anemia. He  takes 162mg  ASA daily and an occasional goody powder,, no overt GI bleeding and negative hemoccult by PCP. -He is less than 3 years out from colonoscopy. At this point I will schedule him for an EGD. If negative then will consider repeat colonoscopy, +/- capsule endo.  The risks and benefits of EGD were discussed and the patient agrees to proceed. Will contact Endocrinologist about instructions for insulin pump management since patient will be NPO for EGD -No more goody powders  -continue PPI  2.  DM has insulin pump, COPD, and CAD / remote CABG.   3. Hypothyroidism, recent TSH 4.56. On synthroid  Cc:  Cecille Amsterdam, MD  Tye Savoy  06/14/2016, 11:00 AM

## 2016-06-14 NOTE — Telephone Encounter (Signed)
Oscar Robinson 1946-01-19 [id number]  Dear Dr.Bindubal   Tye Savoy, NP has scheduled the above patient for an endoscopy at [facility] gastroenterology on 06-26-16.  Our records show that he/she is on insulin therapy via an insulin pump.  Our endoscopy prep protocol requires that:  the patient must be on a clear liquid diet the morning of the procedure  the patient must be NPO for 3 hours prior to the procedure   Please advise [place] of any adjustments that need to be made to the patient's insulin pump therapy prior to the above procedure date.    Please route or fax back this completed form to me at [phone] .  If you have any question, please call me at [phone].  Thank you for your help with this matter.  Sincerely,     Desiree B., CMA

## 2016-06-14 NOTE — Patient Instructions (Addendum)

## 2016-06-19 NOTE — Progress Notes (Signed)
I agree with the above note, plan 

## 2016-06-21 NOTE — Telephone Encounter (Signed)
Received fax from Dr. Lacy Duverney office with directions for insulin pump as follows: Set temporary basel rate at 50% starting 3 hrs before procuedure and restart standard basel rate when he is taking PO again.   Patient informed and also asked me to clarify some prep instructions that were confusing. Patient verbalized understanding.

## 2016-06-25 ENCOUNTER — Encounter: Payer: Self-pay | Admitting: Nurse Practitioner

## 2016-06-26 ENCOUNTER — Ambulatory Visit (AMBULATORY_SURGERY_CENTER): Payer: Medicare Other | Admitting: Gastroenterology

## 2016-06-26 ENCOUNTER — Encounter: Payer: Self-pay | Admitting: Gastroenterology

## 2016-06-26 VITALS — BP 140/69 | HR 72 | Temp 97.3°F | Resp 21 | Ht 69.0 in | Wt 187.0 lb

## 2016-06-26 DIAGNOSIS — D509 Iron deficiency anemia, unspecified: Secondary | ICD-10-CM | POA: Diagnosis present

## 2016-06-26 DIAGNOSIS — K449 Diaphragmatic hernia without obstruction or gangrene: Secondary | ICD-10-CM | POA: Diagnosis not present

## 2016-06-26 DIAGNOSIS — K2210 Ulcer of esophagus without bleeding: Secondary | ICD-10-CM | POA: Diagnosis not present

## 2016-06-26 LAB — GLUCOSE, CAPILLARY
GLUCOSE-CAPILLARY: 94 mg/dL (ref 65–99)
GLUCOSE-CAPILLARY: 152 mg/dL — AB (ref 65–99)

## 2016-06-26 MED ORDER — ESOMEPRAZOLE MAGNESIUM 40 MG PO CPDR: 40.0000 mg | DELAYED_RELEASE_CAPSULE | Freq: Two times a day (BID) | ORAL | 11 refills | Status: DC

## 2016-06-26 MED ORDER — SODIUM CHLORIDE 0.9 % IV SOLN: 500.0000 mL | INTRAVENOUS | Status: AC

## 2016-06-26 NOTE — Op Note (Signed)
[place] Patient Name: Oscar Robinson Procedure Date: 06/26/2016 8:36 AM MRN: [id number] Endoscopist: Milus Banister , MD Age: 70 Referring MD:  Date of Birth: Jul 07, 1946 Gender: Male Account #: [id number] Procedure:                Upper GI endoscopy Indications:              Iron deficiency anemia Medicines:                Monitored Anesthesia Care Procedure:                Pre-Anesthesia Assessment:                           - Prior to the procedure, a History and Physical                            was performed, and patient medications and                            allergies were reviewed. The patient's tolerance of                            previous anesthesia was also reviewed. The risks                            and benefits of the procedure and the sedation                            options and risks were discussed with the patient.                            All questions were answered, and informed consent                            was obtained. Prior Anticoagulants: The patient has                            taken no previous anticoagulant or antiplatelet                            agents. ASA Grade Assessment: II - A patient with                            mild systemic disease. After reviewing the risks                            and benefits, the patient was deemed in                            satisfactory condition to undergo the procedure.                           After obtaining informed consent, the endoscope was  passed under direct vision. Throughout the                            procedure, the patient's blood pressure, pulse, and                            oxygen saturations were monitored continuously. The                            Model GIF-[id number]) scope was introduced                            through the mouth, and advanced to the second part                            of duodenum. The upper GI  endoscopy was                            accomplished without difficulty. The patient                            tolerated the procedure well. Scope In: Scope Out: Findings:                 The GE junction today looks very similar to GE                            junction during 2015 EGD (Dr. Hilarie Fredrickson); LA Grade C                            (one or more mucosal breaks continuous between tops                            of 2 or more mucosal folds, less than 75%                            circumference) ulcerative esophagitis was found.                            The GE junction was slightly strictured (allowed                            scope passage easily however) and there was a small                            diverticulum just above the GE junction.                           4cm hiatal hernia, otherwise normal stomach without                            Cameron's erosions.  The examined duodenum was normal.                           Biopsies for histology were taken with a cold                            forceps in the second portion of the duodenum for                            evaluation of celiac disease. Complications:            No immediate complications. Estimated blood loss:                            None. Estimated Blood Loss:     Estimated blood loss: none. Impression:               - LA Grade C reflux esophagitis.                           - Normal stomach.                           - Normal examined duodenum.                           - Biopsies were taken with a cold forceps for                            evaluation of celiac disease. Recommendation:           - Patient has a contact number available for                            emergencies. The signs and symptoms of potential                            delayed complications were discussed with the                            patient. Return to normal activities tomorrow.                             Written discharge instructions were provided to the                            patient.                           - Resume previous diet.                           - Please increase your nexium to twice daily dosing                            (20-30 min prior to BF and dinner meals). A new  script was called in today.                           - Please start once daily OC iron supplement                            (ferrous sulfate 325mg )                           - Office will arrange for follow up with Dr. Hilarie Fredrickson                            or Nevin Bloodgood in 2-3 months.                           - Await pathology results. Milus Banister, MD 06/26/2016 8:56:25 AM This report has been signed electronically.

## 2016-06-26 NOTE — Progress Notes (Signed)
Called to room to assist during endoscopic procedure.  Patient ID and intended procedure confirmed with present staff. Received instructions for my participation in the procedure from the performing physician.  

## 2016-06-26 NOTE — Patient Instructions (Signed)
YOU HAD AN ENDOSCOPIC PROCEDURE TODAY AT [facility]:   Refer to the procedure report that was given to you for any specific questions about what was found during the examination.  If the procedure report does not answer your questions, please call your gastroenterologist to clarify.  If you requested that your care partner not be given the details of your procedure findings, then the procedure report has been included in a sealed envelope for you to review at your convenience later.  YOU SHOULD EXPECT: Some feelings of bloating in the abdomen. Passage of more gas than usual.  Walking can help get rid of the air that was put into your GI tract during the procedure and reduce the bloating. If you had a lower endoscopy (such as a colonoscopy or flexible sigmoidoscopy) you may notice spotting of blood in your stool or on the toilet paper. If you underwent a bowel prep for your procedure, you may not have a normal bowel movement for a few days.  Please Note:  You might notice some irritation and congestion in your nose or some drainage.  This is from the oxygen used during your procedure.  There is no need for concern and it should clear up in a day or so.  SYMPTOMS TO REPORT IMMEDIATELY:   Following lower endoscopy (colonoscopy or flexible sigmoidoscopy):  Excessive amounts of blood in the stool  Significant tenderness or worsening of abdominal pains  Swelling of the abdomen that is new, acute  Fever of 100F or higher   Following upper endoscopy (EGD)  Vomiting of blood or coffee ground material  New chest pain or pain under the shoulder blades  Painful or persistently difficult swallowing  New shortness of breath  Fever of 100F or higher  Black, tarry-looking stools  For urgent or emergent issues, a gastroenterologist can be reached at any hour by calling [phone].   DIET:  We do recommend a small meal at first, but then you may proceed to your regular diet.  Drink  plenty of fluids but you should avoid alcoholic beverages for 24 hours.  ACTIVITY:  You should plan to take it easy for the rest of today and you should NOT DRIVE or use heavy machinery until tomorrow (because of the sedation medicines used during the test).    FOLLOW UP: Our staff will call the number listed on your records the next business day following your procedure to check on you and address any questions or concerns that you may have regarding the information given to you following your procedure. If we do not reach you, we will leave a message.  However, if you are feeling well and you are not experiencing any problems, there is no need to return our call.  We will assume that you have returned to your regular daily activities without incident.  If any biopsies were taken you will be contacted by phone or by letter within the next 1-3 weeks.  Please call us at [phone] if you have not heard about the biopsies in 3 weeks.    SIGNATURES/CONFIDENTIALITY: You and/or your care partner have signed paperwork which will be entered into your electronic medical record.  These signatures attest to the fact that that the information above on your After Visit Summary has been reviewed and is understood.  Full responsibility of the confidentiality of this discharge information lies with you and/or your care-partner.YOU HAD AN ENDOSCOPIC PROCEDURE TODAY AT [facility]:  Refer to the procedure report that was given to you for any specific questions about what was found during the examination.  If the procedure report does not answer your questions, please call your gastroenterologist to clarify.  If you requested that your care partner not be given the details of your procedure findings, then the procedure report has been included in a sealed envelope for you to review at your convenience later.  YOU SHOULD EXPECT: Some feelings of bloating in the abdomen. Passage of more gas than  usual.  Walking can help get rid of the air that was put into your GI tract during the procedure and reduce the bloating. If you had a lower endoscopy (such as a colonoscopy or flexible sigmoidoscopy) you may notice spotting of blood in your stool or on the toilet paper. If you underwent a bowel prep for your procedure, you may not have a normal bowel movement for a few days.  Please Note:  You might notice some irritation and congestion in your nose or some drainage.  This is from the oxygen used during your procedure.  There is no need for concern and it should clear up in a day or so.  SYMPTOMS TO REPORT IMMEDIATELY:   Following upper endoscopy (EGD)  Vomiting of blood or coffee ground material  New chest pain or pain under the shoulder blades  Painful or persistently difficult swallowing  New shortness of breath  Fever of 100F or higher  Black, tarry-looking stools  For urgent or emergent issues, a gastroenterologist can be reached at any hour by calling [phone].  Increase NExium to twice daily (20 -30 min before breakfast and dinner). Start Iron supplement Ferrous Sulfate 325 mg daily.   DIET:  We do recommend a small meal at first, but then you may proceed to your regular diet.  Drink plenty of fluids but you should avoid alcoholic beverages for 24 hours.  ACTIVITY:  You should plan to take it easy for the rest of today and you should NOT DRIVE or use heavy machinery until tomorrow (because of the sedation medicines used during the test).    FOLLOW UP: Our staff will call the number listed on your records the next business day following your procedure to check on you and address any questions or concerns that you may have regarding the information given to you following your procedure. If we do not reach you, we will leave a message.  However, if you are feeling well and you are not experiencing any problems, there is no need to return our call.  We will assume that you have  returned to your regular daily activities without incident.  If any biopsies were taken you will be contacted by phone or by letter within the next 1-3 weeks.  Please call us at [phone] if you have not heard about the biopsies in 3 weeks.    SIGNATURES/CONFIDENTIALITY: You and/or your care partner have signed paperwork which will be entered into your electronic medical record.  These signatures attest to the fact that that the information above on your After Visit Summary has been reviewed and is understood.  Full responsibility of the confidentiality of this discharge information lies with you and/or your care-partner.  Thank you for letting us take care of your healthcare needs today.

## 2016-06-26 NOTE — Progress Notes (Signed)
Patient awakening,vss,report to rn 

## 2016-06-27 ENCOUNTER — Other Ambulatory Visit: Payer: Self-pay

## 2016-06-27 ENCOUNTER — Telehealth: Payer: Self-pay

## 2016-06-27 ENCOUNTER — Telehealth: Payer: Self-pay | Admitting: Gastroenterology

## 2016-06-27 ENCOUNTER — Telehealth: Payer: Self-pay | Admitting: *Deleted

## 2016-06-27 NOTE — Telephone Encounter (Signed)
Can you please call him to clarify what meds he is asking about?

## 2016-06-27 NOTE — Telephone Encounter (Signed)
  Follow up Call-  Call back number 06/26/2016 01/12/2014  Post procedure Call Back phone  # [phone] hm  Permission to leave phone message Yes Yes  Some recent data might be hidden     Patient questions:  Do you have a fever, pain , or abdominal swelling? No. Pain Score  0 *  Have you tolerated food without any problems? Yes.    Have you been able to return to your normal activities? Yes.    Do you have any questions about your discharge instructions: Diet   No. Medications  No. Follow up visit  No.  Do you have questions or concerns about your Care? No.  Actions: * If pain score is 4 or above: No action needed, pain <4.

## 2016-06-27 NOTE — Telephone Encounter (Signed)
  Follow up Call-  Call back number 06/26/2016 01/12/2014  Post procedure Call Back phone  # [phone] hm  Permission to leave phone message Yes Yes  Some recent data might be hidden     Patient questions:  Do you have a fever, pain , or abdominal swelling? No. Pain Score  0 *  Have you tolerated food without any problems? Yes.    Have you been able to return to your normal activities? Yes.    Do you have any questions about your discharge instructions: Diet   No. Medications  No. Follow up visit  No.  Do you have questions or concerns about your Care? No.  Actions: * If pain score is 4 or above: No action needed, pain <4.

## 2016-06-27 NOTE — Telephone Encounter (Signed)
Pt calling for Normal saline script to be sent to [facility]. Cannot find documentation regarding order. Please advise.

## 2016-07-02 ENCOUNTER — Encounter: Payer: Self-pay | Admitting: Gastroenterology

## 2016-07-02 ENCOUNTER — Telehealth: Payer: Self-pay | Admitting: Gastroenterology

## 2016-07-02 NOTE — Telephone Encounter (Signed)
The pt has been informed that the nexium should be increased to twice daily dosing and a prescription was sent to his mail order pharmacy.  He will call if he has not received this by the time his current prescription runs out.

## 2016-07-02 NOTE — Telephone Encounter (Signed)
Incoming call:  Pt states [facility] in [place] has not received prescriptions for stronger Nexium.

## 2016-07-02 NOTE — Telephone Encounter (Signed)
See alternate note  

## 2016-07-09 ENCOUNTER — Ambulatory Visit: Payer: Self-pay | Admitting: Neurology

## 2016-07-19 ENCOUNTER — Encounter: Payer: Self-pay | Admitting: Emergency Medicine

## 2016-07-19 ENCOUNTER — Ambulatory Visit (INDEPENDENT_AMBULATORY_CARE_PROVIDER_SITE_OTHER): Payer: Medicare Other | Admitting: Emergency Medicine

## 2016-07-19 DIAGNOSIS — I2510 Atherosclerotic heart disease of native coronary artery without angina pectoris: Secondary | ICD-10-CM | POA: Diagnosis not present

## 2016-07-19 DIAGNOSIS — B9789 Other viral agents as the cause of diseases classified elsewhere: Secondary | ICD-10-CM

## 2016-07-19 DIAGNOSIS — J069 Acute upper respiratory infection, unspecified: Secondary | ICD-10-CM | POA: Diagnosis not present

## 2016-07-19 DIAGNOSIS — N390 Urinary tract infection, site not specified: Secondary | ICD-10-CM | POA: Insufficient documentation

## 2016-07-19 DIAGNOSIS — J449 Chronic obstructive pulmonary disease, unspecified: Secondary | ICD-10-CM | POA: Diagnosis not present

## 2016-07-19 NOTE — Patient Instructions (Signed)
Continue your spiriva and symbicort as you are taking them  Take albuterol 2 puffs up to every 4 hours if needed for shortness of breath.  [place] e medications like vitamin C, zinc, tylenol cold & flu as needed for the upper respiratory infection Watch for evidence of a flare-up of your COPD, especially more cough, more mucous, a change in color of your mucous, fevers, malaise. If you develop these things then call our office.  Follow with Dr [name] in 4 months or sooner if you have any problems.

## 2016-07-19 NOTE — Assessment & Plan Note (Signed)
[place] e medications like vitamin C, zinc, tylenol cold & flu as needed for the upper respiratory infection Watch for evidence of a flare-up of your COPD, especially more cough, more mucous, a change in color of your mucous, fevers, malaise. If you develop these things then call our office.  Follow with Dr [name] in 4 months or sooner if you have any problems.

## 2016-07-19 NOTE — Progress Notes (Signed)
Oscar Robinson is a 70 year old gentleman with known coronary artery disease status post CABG. Also has a history of significant tobacco abuse and airflow limitation on pulmonary function testing. He has a positive bronchodilator response. Severe COPD. He has OSA, not on CPAP.   ROV 07/19/16 -- this is a follow-up visit for severe COPD. He also has a history of untreated obstructive sleep apnea and coronary artery disease. He had an acute exacerbation in October and was treated with antibiotics and prednisone. He improved to baseline. We have tried him in the past on Stiolto but he did not feel that this was any better then Spiriva and Symbicort. He's currently on the latter 2 medications. Feels that he is back to his baseline. He was exposed to a URI last week, has developed some scratchy throat, may have a cold coming. Uses albuterol a few times a week. He was able to work today.    EXAM:  Vitals:   07/19/16 1336  BP: 126/76  BP Location: Right Arm  Patient Position: Sitting  Cuff Size: Normal  Pulse: 89  SpO2: 95%  Weight: 188 lb 12.8 oz (85.6 kg)  Height: 5\' 8"  (1.727 m)   Gen: Pleasant, well-nourished, in no distress,  normal affect  ENT: No lesions,  mouth clear,  oropharynx clear, no postnasal drip  Neck: No JVD, no TMG, no carotid bruits  Lungs: No use of accessory muscles, no wheezes  Cardiovascular: RRR, heart sounds normal, no murmur or gallops, no peripheral edema  Musculoskeletal: No deformities, no cyanosis or clubbing  Neuro: alert, non focal  Skin: Warm, no lesions or rashes   URI (upper respiratory infection) [place] e medications like vitamin C, zinc, tylenol cold & flu as needed for the upper respiratory infection Watch for evidence of a flare-up of your COPD, especially more cough, more mucous, a change in color of your mucous, fevers, malaise. If you develop these things then call our office.  Follow with Dr [name] in 4 months or sooner if you have any problems.  COPD  (chronic obstructive pulmonary disease) Reviewed the signs and symptoms of exacerbation with him today since he is dealing with an upper rest or infection. I do not think he's currently exacerbated but he is at high risk  Continue your spiriva and symbicort as you are taking them  Take albuterol 2 puffs up to every 4 hours if needed for shortness of breath.     Baltazar Apo, MD, PhD 07/19/2016, 1:50 PM [facility] [phone] or if no answer [phone]

## 2016-07-19 NOTE — Assessment & Plan Note (Signed)
Reviewed the signs and symptoms of exacerbation with him today since he is dealing with an upper rest or infection. I do not think he's currently exacerbated but he is at high risk  Continue your spiriva and symbicort as you are taking them  Take albuterol 2 puffs up to every 4 hours if needed for shortness of breath.

## 2016-07-20 ENCOUNTER — Ambulatory Visit: Payer: Medicare Other

## 2016-07-29 ENCOUNTER — Other Ambulatory Visit: Payer: Self-pay | Admitting: Emergency Medicine

## 2016-08-08 ENCOUNTER — Encounter: Payer: Self-pay | Admitting: *Deleted

## 2016-08-30 ENCOUNTER — Telehealth: Payer: Self-pay | Admitting: Cardiology

## 2016-08-30 NOTE — Telephone Encounter (Signed)
New Message  Pt voiced wanting to know if he could come in for labs due to his hemoglobin was low.  Advised pt about appt due for 4.28.18 and we could schedule, but pt wants labs done prior too April hopefully this month or next month.  Please f/u with pt

## 2016-08-30 NOTE — Telephone Encounter (Signed)
Patient advised to follow up with his PCP about his low hemoglobin which was the recommendation of Cecilie Kicks, NP when his last CBC was drawn (06/05/16). Patient states he has not seen his PCP since the last lab draw and that he would follow up with him.

## 2016-08-30 NOTE — Telephone Encounter (Signed)
Left message to call back  

## 2016-09-06 ENCOUNTER — Encounter: Payer: Self-pay | Admitting: Internal Medicine

## 2016-09-06 ENCOUNTER — Other Ambulatory Visit: Payer: Self-pay

## 2016-09-06 ENCOUNTER — Other Ambulatory Visit (INDEPENDENT_AMBULATORY_CARE_PROVIDER_SITE_OTHER): Payer: Medicare Other

## 2016-09-06 ENCOUNTER — Ambulatory Visit (INDEPENDENT_AMBULATORY_CARE_PROVIDER_SITE_OTHER): Payer: Medicare Other | Admitting: Internal Medicine

## 2016-09-06 VITALS — BP 134/70 | HR 80 | Ht 69.5 in | Wt 189.0 lb

## 2016-09-06 DIAGNOSIS — D509 Iron deficiency anemia, unspecified: Secondary | ICD-10-CM

## 2016-09-06 DIAGNOSIS — K2100 Gastro-esophageal reflux disease with esophagitis, without bleeding: Secondary | ICD-10-CM

## 2016-09-06 DIAGNOSIS — D500 Iron deficiency anemia secondary to blood loss (chronic): Secondary | ICD-10-CM | POA: Diagnosis not present

## 2016-09-06 DIAGNOSIS — R195 Other fecal abnormalities: Secondary | ICD-10-CM | POA: Diagnosis not present

## 2016-09-06 LAB — CBC WITH DIFFERENTIAL/PLATELET
BASOS ABS: 0.1 10*3/uL (ref 0.0–0.1)
BASOS PCT: 1 % (ref 0.0–3.0)
EOS ABS: 0.3 10*3/uL (ref 0.0–0.7)
Eosinophils Relative: 3.6 % (ref 0.0–5.0)
HCT: 31.8 % — ABNORMAL LOW (ref 39.0–52.0)
HEMOGLOBIN: 9.9 g/dL — AB (ref 13.0–17.0)
LYMPHS PCT: 27.1 % (ref 12.0–46.0)
Lymphs Abs: 2.2 10*3/uL (ref 0.7–4.0)
MCHC: 31.1 g/dL (ref 30.0–36.0)
MCV: 71.3 fl — ABNORMAL LOW (ref 78.0–100.0)
MONO ABS: 1 10*3/uL (ref 0.1–1.0)
Monocytes Relative: 12.6 % — ABNORMAL HIGH (ref 3.0–12.0)
Neutro Abs: 4.4 10*3/uL (ref 1.4–7.7)
Neutrophils Relative %: 55.7 % (ref 43.0–77.0)
Platelets: 253 10*3/uL (ref 150.0–400.0)
RBC: 4.46 Mil/uL (ref 4.22–5.81)
RDW: 21.5 % — AB (ref 11.5–15.5)
WBC: 8 10*3/uL (ref 4.0–10.5)

## 2016-09-06 LAB — IBC PANEL
IRON: 48 ug/dL (ref 42–165)
SATURATION RATIOS: 8.7 % — AB (ref 20.0–50.0)
Transferrin: 393 mg/dL — ABNORMAL HIGH (ref 212.0–360.0)

## 2016-09-06 LAB — FERRITIN: FERRITIN: 6.2 ng/mL — AB (ref 22.0–322.0)

## 2016-09-06 MED ORDER — DIPHENOXYLATE-ATROPINE 2.5-0.025 MG PO TABS: 1.0000 | ORAL_TABLET | Freq: Three times a day (TID) | ORAL | 2 refills | Status: DC | PRN

## 2016-09-06 MED ORDER — INTEGRA 62.5-62.5-40-3 MG PO CAPS: 1.0000 | ORAL_CAPSULE | Freq: Every day | ORAL | 2 refills | Status: DC

## 2016-09-06 NOTE — Progress Notes (Signed)
Subjective:    Patient ID: Oscar Robinson, male    DOB: 1945/08/29, 71 y.o.   MRN: [id number]  HPI Oscar Robinson is a 71 year old male with a past medical history of reflux esophagitis, adenomatous colon polyps relatively new iron deficiency anemia who is here for follow-up. He also has a history of diabetes, CAD status post CABG and mild COPD. He was seen by Tye Savoy, NP in November 2017 and came for upper endoscopy and colonoscopy which were performed by Dr. Ardis Hughs on 06/26/2016.  EGD revealed LA grade C reflux esophagitis with mild stricturing and a diverticulum above the GE junction. A 4 cm hiatal hernia without Cameron's erosions and a normal duodenum. Duodenal biopsies were normal. Colonoscopy revealed 2 polyps in the ascending and descending colon less than 5 mm which were removed with cold snare. These were tubular adenomas. There is moderate diverticulosis in the left colon.  He has been on Nexium 20 mg twice daily since this endoscopy and taking oral iron over-the-counter. He recently switched his oral iron tablet to Geritol containing iron because he developed loose stools with this medication. Loose stools or not daily but on some days he will have 2-3 loose stools with some fecal smearing. He's had darker stools on iron. No visible blood or melena. He denies heartburn and heartburn has been better with the second dose of Nexium. Denies dysphagia. Denies nausea or vomiting. Reports good appetite.  Was taking what sounds like an ACE inhibitor but this was changed due to nocturnal cough. The nocturnal cough has resolved.   Review of Systems As per history of present illness, otherwise negative  Current Medications, Allergies, Past Medical History, Past Surgical History, Family History and Social History were reviewed in [employer] record.     Objective:   Physical Exam BP 134/70 (BP Location: Left Arm, Patient Position: Sitting, Cuff Size: Normal)    Pulse 80   Ht 5' 9.5" (1.765 m) Comment: height measured without shoes  Wt 189 lb (85.7 kg)   BMI 27.51 kg/m  Constitutional: Well-developed and well-nourished. No distress. HEENT: Normocephalic and atraumatic. Oropharynx is clear and moist. No oropharyngeal exudate. Conjunctivae are normal.  No scleral icterus. Neck: Neck supple. Trachea midline. Cardiovascular: Normal rate, regular rhythm and intact distal pulses. No M/R/G Pulmonary/chest: Effort normal and breath sounds normal. No wheezing, rales or rhonchi. Abdominal: Soft, nontender, nondistended. Bowel sounds active throughout. Umbilical hernia which is reducible and nontender Extremities: no clubbing, cyanosis, or edema Neurological: Alert and oriented to person place and time. Skin: Skin is warm and dry. No rashes noted. Psychiatric: Normal mood and affect. Behavior is normal.  CBC    Component Value Date/Time   WBC 6.0 06/05/2016 1055   RBC 4.14 (L) 06/05/2016 1055   HGB 8.8 (L) 06/05/2016 1055   HCT 30.2 (L) 06/05/2016 1055   PLT 332 06/05/2016 1055   MCV 72.9 (L) 06/05/2016 1055   MCH 21.3 (L) 06/05/2016 1055   MCHC 29.1 (L) 06/05/2016 1055   RDW 18.5 (H) 06/05/2016 1055   LYMPHSABS 2.3 06/11/2013 1531   MONOABS 0.9 06/11/2013 1531   EOSABS 0.2 06/11/2013 1531   BASOSABS 0.0 06/11/2013 1531   CMP     Component Value Date/Time   NA 136 06/05/2016 1055   K 4.7 06/05/2016 1055   CL 103 06/05/2016 1055   CO2 25 06/05/2016 1055   GLUCOSE 140 (H) 06/05/2016 1055   BUN 22 06/05/2016 1055   CREATININE 1.26 (H) 06/05/2016  1055   CALCIUM 9.3 06/05/2016 1055   PROT 6.3 06/05/2016 1055   ALBUMIN 3.9 06/05/2016 1055   AST 19 06/05/2016 1055   ALT 15 06/05/2016 1055   ALKPHOS 45 06/05/2016 1055   BILITOT 0.4 06/05/2016 1055   GFRNONAA 84 (L) 04/09/2012 1500   GFRAA >90 04/09/2012 1500    Assessment & Plan:  71 year old male with a past medical history of reflux esophagitis, adenomatous colon polyps relatively new  iron deficiency anemia who is here for follow-up  1. Reflux esophagitis -- in part related to hiatal hernia. We'll continue Nexium 20 mg twice a day before meals to hopefully improve the esophagitis. To this point this is the most likely source for the GI blood loss leading to IDA. Antireflux diet and reflux precautions recommended.  2. Iron deficiency anemia -- repeat CBC and iron studies today. Change Geritol to Integra 1 capsule daily hopefully this will lessen loose stools. If iron study persist after 3 months of oral iron replacement then would recommend video capsule endoscopy. Return in 3 months to discuss  3. Loose stools -- colonoscopy reviewed with the patient. Perhaps this is related oral iron but we discussed that this is not the most common reaction to iron replacement. Changing iron supplementation as above. Lomotil 1 tab 3 times a day when necessary for loose stools  4. History of adenomatous colon polyps -- repeat colonoscopy November 2022  25 minutes spent with the patient today. Greater than 50% was spent in counseling and coordination of care with the patient

## 2016-09-06 NOTE — Patient Instructions (Addendum)
Continue Nexium 20 mg twice daily before meals.  We have sent the following medications to your pharmacy for you to pick up at your convenience: Integra Lomotil   We have sent the following medications to your pharmacy for you to pick up at your convenience: CBC, IBC, Ferritin  If you are age 70 or older, your body mass index should be between 23-30. Your Body mass index is 27.51 kg/m. If this is out of the aforementioned range listed, please consider follow up with your Primary Care Provider.  If you are age 67 or younger, your body mass index should be between 19-25. Your Body mass index is 27.51 kg/m. If this is out of the aformentioned range listed, please consider follow up with your Primary Care Provider.

## 2016-09-11 DIAGNOSIS — I10 Essential (primary) hypertension: Secondary | ICD-10-CM | POA: Diagnosis not present

## 2016-09-11 DIAGNOSIS — E1165 Type 2 diabetes mellitus with hyperglycemia: Secondary | ICD-10-CM | POA: Diagnosis not present

## 2016-09-11 DIAGNOSIS — E049 Nontoxic goiter, unspecified: Secondary | ICD-10-CM | POA: Diagnosis not present

## 2016-09-11 DIAGNOSIS — E7800 Pure hypercholesterolemia, unspecified: Secondary | ICD-10-CM | POA: Diagnosis not present

## 2016-10-22 DIAGNOSIS — F411 Generalized anxiety disorder: Secondary | ICD-10-CM | POA: Diagnosis not present

## 2016-10-22 DIAGNOSIS — K210 Gastro-esophageal reflux disease with esophagitis: Secondary | ICD-10-CM | POA: Diagnosis not present

## 2016-10-22 DIAGNOSIS — E119 Type 2 diabetes mellitus without complications: Secondary | ICD-10-CM | POA: Diagnosis not present

## 2016-10-22 DIAGNOSIS — D649 Anemia, unspecified: Secondary | ICD-10-CM | POA: Diagnosis not present

## 2016-10-22 DIAGNOSIS — E784 Other hyperlipidemia: Secondary | ICD-10-CM | POA: Diagnosis not present

## 2016-10-22 DIAGNOSIS — I10 Essential (primary) hypertension: Secondary | ICD-10-CM | POA: Diagnosis not present

## 2016-10-22 DIAGNOSIS — Z79899 Other long term (current) drug therapy: Secondary | ICD-10-CM | POA: Diagnosis not present

## 2016-10-30 ENCOUNTER — Telehealth: Payer: Self-pay | Admitting: Acute Care

## 2016-10-31 NOTE — Telephone Encounter (Signed)
A fax was sent to [facility] patient at [phone] for a confirmation order. The order is for albuterol 0.083% 76ml to use four times a day with quantity of 120. Confirmation fax was received. Nothing further is needed.

## 2016-11-13 ENCOUNTER — Encounter: Payer: Self-pay | Admitting: Emergency Medicine

## 2016-11-13 ENCOUNTER — Ambulatory Visit (INDEPENDENT_AMBULATORY_CARE_PROVIDER_SITE_OTHER): Payer: Medicare Other | Admitting: Emergency Medicine

## 2016-11-13 DIAGNOSIS — J449 Chronic obstructive pulmonary disease, unspecified: Secondary | ICD-10-CM | POA: Diagnosis not present

## 2016-11-13 DIAGNOSIS — G4733 Obstructive sleep apnea (adult) (pediatric): Secondary | ICD-10-CM

## 2016-11-13 NOTE — Assessment & Plan Note (Signed)
Please continue your Spiriva and Symbicort as you are taking them  Take albuterol either nebulized or 2 puffs up to every 4 hours if needed for shortness of breath.  Call our office if you develop allergy symptoms and we can recommend treatment for this. Follow with Dr [name] in 4 months or sooner if you have any problems.

## 2016-11-13 NOTE — Assessment & Plan Note (Signed)
Untreated  

## 2016-11-13 NOTE — Patient Instructions (Signed)
Please continue your Spiriva and Symbicort as you are taking them  Take albuterol either nebulized or 2 puffs up to every 4 hours if needed for shortness of breath.  Please continue your Nexium 40mg  daily Call our office if you develop allergy symptoms and we can recommend treatment for this. Follow with Dr [name] in 4 months or sooner if you have any problems.

## 2016-11-13 NOTE — Progress Notes (Signed)
Mr. Zelek is a 71 year old gentleman with known coronary artery disease status post CABG. Also has a history of significant tobacco abuse and airflow limitation on pulmonary function testing. He has a positive bronchodilator response. Severe COPD. He has OSA, not on CPAP.   ROV 07/19/16 -- this is a follow-up visit for severe COPD. He also has a history of untreated obstructive sleep apnea and coronary artery disease. He had an acute exacerbation in October and was treated with antibiotics and prednisone. He improved to baseline. We have tried him in the past on Stiolto but he did not feel that this was any better then Spiriva and Symbicort. He's currently on the latter 2 medications. Feels that he is back to his baseline. He was exposed to a URI last week, has developed some scratchy throat, may have a cold coming. Uses albuterol a few times a week. He was able to work today.  ROV 11/13/16 -- patient with a history of severe COPD, untreated OSA. He returns today a for a regular follow-up visit.  He did well through the winter, did not have any flares, no URI, etc. Remains on Spiriva and Symbicort, feels that they work well. He exercises almost every day. He is getting ready to plant fields, drives the tractor. He rarely uses albuterol, but feels that he benefits when he does need it. Remains on nexium. not currently on allergy meds.    EXAM:  Vitals:   11/13/16 1419  BP: 124/72  Pulse: 72  SpO2: 100%  Weight: 185 lb 12.8 oz (84.3 kg)  Height: 5\' 8"  (1.727 m)   Gen: Pleasant, well-nourished, in no distress,  normal affect  ENT: No lesions,  mouth clear,  oropharynx clear, no postnasal drip  Neck: No JVD, no TMG, no carotid bruits  Lungs: No use of accessory muscles, no wheezes  Cardiovascular: RRR, heart sounds normal, no murmur or gallops, no peripheral edema  Musculoskeletal: No deformities, no cyanosis or clubbing  Neuro: alert, non focal  Skin: Warm, no lesions or  rashes   Obstructive sleep apnea Untreated  COPD (chronic obstructive pulmonary disease) Please continue your Spiriva and Symbicort as you are taking them  Take albuterol either nebulized or 2 puffs up to every 4 hours if needed for shortness of breath.  Call our office if you develop allergy symptoms and we can recommend treatment for this. Follow with Dr [name] in 4 months or sooner if you have any problems.    Baltazar Apo, MD, PhD 11/13/2016, 2:49 PM [facility] [phone] or if no answer [phone]

## 2016-11-27 ENCOUNTER — Encounter: Payer: Self-pay | Admitting: Cardiology

## 2016-11-27 ENCOUNTER — Ambulatory Visit (INDEPENDENT_AMBULATORY_CARE_PROVIDER_SITE_OTHER): Payer: Medicare Other | Admitting: Cardiology

## 2016-11-27 VITALS — BP 126/68 | HR 75 | Ht 68.0 in | Wt 184.0 lb

## 2016-11-27 DIAGNOSIS — I2510 Atherosclerotic heart disease of native coronary artery without angina pectoris: Secondary | ICD-10-CM

## 2016-11-27 DIAGNOSIS — J438 Other emphysema: Secondary | ICD-10-CM

## 2016-11-27 DIAGNOSIS — E7800 Pure hypercholesterolemia, unspecified: Secondary | ICD-10-CM

## 2016-11-27 NOTE — Patient Instructions (Signed)

## 2016-11-27 NOTE — Progress Notes (Signed)
[facility] [address] [place], [place]  [address] Phone: [phone] Fax:  [phone]  Date:  11/27/2016   ID:  Oscar Robinson, DOB May 20, 1946, MRN [id number]  PCP:  [name], MD   History of Present Illness: Oscar Robinson is a 71 y.o. male with coronary artery disease status post bypass surgery 1999, significant tobacco use, COPD with recent increasing shortness of breath, dyspnea on exertion here at the request of [name] for further evaluation.  Dyspnea with exertional with no chest pain, currently moderate in severity, relieved with rest with no obvious exacerbating factors, non-positional. No palpitations. Prior to CABG had symptoms of dyspnea mostly. Fatigue.   Last stress test was about 2 years ago he thinks in [place] at [facility]. [name] it was OK.   06/11/13 he underwent nuclear stress test where he had marked ST segment depression diffusely during exercise, relieved with rest as well as marked shortness of breath, pale. Blood pressure also decreased during exercise.  07/10/13 -native vessel LAD/diagonal bifurcation disease with patent LIMA to LAD/diagonal albeit small caliber vessel. Normal perfusion pattern on nuclear stress test. My concern previously was ST segment depression as well as decrease in blood pressure during exercise. This is described above. Ejection fraction 60%. We decided to continue with medical management. Metoprolol, amlodipine, pravastatin. No PCI.  11/18/13-he has been having some complaints of dysphagia, food getting stuck in his upper throat. One of his friends died of esophageal cancer. He is a former smoker. He still having shortness of breath. Getting treated for COPD by [name]. Prior cardiac catheterization reviewed with him.   11/17/14- still having dyspnea on exertion. No significant changes however over the past year. He enjoys breeding dogs. He is quite active. In review of [name] note, consideration to  pulmonary rehabilitation in future. Prior cardiac catheterization reviewed once again.   05/16/15- recent episode of severe shortness of breath. Woke up, thought he was going to die. Currently on prednisone taper. Having wild dreams. Lower extremity edema noted. Weight is slightly increased. He is worried about taking doxepin long-term. Would like to talk to a psychiatrist regarding this. He also is worried about undergoing cardiopulmonary exercise test as he "went out" with previous study. Glucose was 26 after he just started his insulin pump.  12/05/15-overall feeling okay. Shortness of breath chronic from COPD. Has been seeing [name], [name]. Underwent cardiopulmonary exercise test on 07/08/15 which showed fairly significant ventilatory obstruction. Chest x-ray on 07/21/15 shows COPD without any evidence of pneumonia. No chest pain. Does well.  11/27/16 - feels tired. Been dx with iron def. Has diarrhea. Denies any anginal symptoms. Has overall been doing fairly well. [name] saw him last and diagnosed iron deficiency anemia. He has been seen in gastroenterology.  Wt Readings from Last 3 Encounters:  11/27/16 184 lb (83.5 kg)  11/13/16 185 lb 12.8 oz (84.3 kg)  09/06/16 189 lb (85.7 kg)     Past Medical History:  Diagnosis Date  . Anemia   . Anxiety   . Arthritis   . COPD (chronic obstructive pulmonary disease) ([place])   . Coronary artery disease   . Depression   . Diabetes mellitus   . Diverticulosis   . GERD (gastroesophageal reflux disease)   . Hiatal hernia   . Hypercholesteremia   . Hypertension   . Sleep apnea    had sleep study and negative per pt  . Thyroid disease   .  Tobacco abuse   . Tubular adenoma of colon     Past Surgical History:  Procedure Laterality Date  . COLONOSCOPY    . CORONARY ARTERY BYPASS GRAFT  1999  . 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 HAND SURGERY     lt palm,ring finger,  . HAND SURGERY Left    pinky finger  . HERNIA REPAIR  1996   lt/rt ing hernia  . LEFT HEART CATHETERIZATION WITH CORONARY/GRAFT ANGIOGRAM N/A 06/15/2013   Procedure: LEFT HEART CATHETERIZATION WITH Beatrix Fetters;  Surgeon: Candee Furbish, MD;  Location[facility] C[facility];  Service: Cardiovascular;  Laterality: N/A;  . UPPER GASTROINTESTINAL ENDOSCOPY      Current Outpatient Prescriptions  Medication Sig Dispense Refill  . albuterol (PROAIR HFA) 108 (90 BASE) MCG/ACT inhaler Inhale 2 puffs into the lungs every 4 (four) hours as needed for wheezing or shortness of breath. 1 Inhaler 3  . albuterol (PROVENTIL) (2.5 MG/3ML) 0.083% nebulizer solution Take 3 mLs (2.5 mg total) by nebulization every 6 (six) hours. 360 mL 12  . ALPRAZolam (XANAX) 0.5 MG tablet Take 0.5 mg by mouth at bedtime.     Marland Kitchen amLODipine (NORVASC) 5 MG tablet TAKE ONE TABLET BY MOUTH ONCE DAILY 90 tablet 1  . aspirin 81 MG tablet Take 162 mg by mouth daily.     . Coenzyme Q10 (CO Q 10) 100 MG CAPS Take 100 mg by mouth daily.    . colesevelam (WELCHOL) 625 MG tablet Take 3 tablets (1,875 mg total) by mouth 2 (two) times daily with a meal. 180 tablet 11  . diphenoxylate-atropine (LOMOTIL) 2.5-0.025 MG tablet Take 1 tablet by mouth 3 (three) times daily as needed for diarrhea or loose stools. 60 tablet 2  . doxepin (SINEQUAN) 50 MG capsule Take 100 mg by mouth daily.    Marland Kitchen esomeprazole (NEXIUM) 20 MG capsule Take 20 mg by mouth 2 (two) times daily.    . Fe Fum-FePoly-Vit C-Vit B3 (INTEGRA) 62.5-62.5-40-3 MG CAPS Take 1 capsule by mouth daily. 30 capsule 2  . HUMALOG 100 UNIT/ML injection With meals/has a pump    . Iron-Vitamins (GERITOL COMPLETE) TABS Take 1 tablet by mouth daily.    Marland Kitchen levothyroxine (SYNTHROID, LEVOTHROID) 50 MCG tablet Take 1 tablet by mouth every  morning.    Marland Kitchen losartan (COZAAR) 50 MG tablet Take 1 tablet (50 mg total) by mouth daily. 90 tablet 3  . metFORMIN (GLUCOPHAGE) 500 MG tablet Take 500 mg by mouth 2 (two) times daily.     . metoprolol succinate (TOPROL-XL) 50 MG 24 hr tablet TAKE ONE TABLET BY MOUTH ONCE DAILY WITH OR IMMEDIATELY FOLLOWING A MEAL 30 tablet 11  . nitroGLYCERIN (NITROSTAT) 0.4 MG SL tablet Place 1 tablet (0.4 mg total) under the tongue every 5 (five) minutes as needed for chest pain. 30 tablet 5  . Omega-3 Fatty Acids (FISH OIL PO) Take 350 mg by mouth 2 (two) times daily.    . pravastatin (PRAVACHOL) 40 MG tablet TAKE ONE TABLET BY MOUTH ONCE DAILY 90 tablet 3  . SPIRIVA HANDIHALER 18 MCG inhalation capsule INHALE ONE DOSE BY MOUTH ONCE DAILY 30 capsule 5  . SYMBICORT 160-4.5 MCG/ACT inhaler INHALE TWO PUFFS BY MOUTH EVERY 12 HOURS RINSE  MOUTH  AFTER  EACH  USE 1  Inhaler 5   Current Facility-Administered Medications  Medication Dose Route Frequency Provider Last Rate Last Dose  . 0.9 %  sodium chloride infusion  500 mL Intravenous Continuous Milus Banister, MD        Allergies:   No Known Allergies  Social History:  The patient  reports that he quit smoking about 19 years ago. His smoking use included Cigarettes. He has a 70.00 pack-year smoking history. He has never used smokeless tobacco. He reports that he drinks alcohol. He reports that he does not use drugs.   Family History  Problem Relation Age of Onset  . Drug abuse Brother     Cocaine  . Heart disease Neg Hx   . Heart failure Neg Hx   . Diabetes Neg Hx   . Hypertension Neg Hx   . Colon cancer Neg Hx   . Esophageal cancer Neg Hx   . Prostate cancer Neg Hx   . Pancreatic cancer Neg Hx   . Kidney disease Neg Hx   . Liver disease Neg Hx   . Stomach cancer Neg Hx   . Rectal cancer Neg Hx     ROS:  Unless specified above, all other review of systems negative  PHYSICAL EXAM: VS:  BP 126/68   Pulse 75   Ht 5\' 8"  (1.727 m)   Wt 184 lb (83.5  kg)   BMI 27.98 kg/m  GEN: Well nourished, well developed, in no acute distress  HEENT: normal  Neck: no JVD, carotid bruits, or masses Cardiac: RRR; no murmurs, rubs, or gallops,no edema  Respiratory:  clear to auscultation bilaterally, normal work of breathing GI: soft, nontender, nondistended, + BS MS: no deformity or atrophy  Skin: warm and dry, no rash Neuro:  Alert and Oriented x 3, Strength and sensation are intact Psych: euthymic mood, full affect   EKG:   EKG was done today. 11/27/16 shows sinus rhythm 75 nonspecific T-wave changes personally viewed-prior 12/05/15-sinus rhythm, 86, no other significant changes. Nonspecific ST-T wave changes.  11/17/14-normal rhythm, no other abnormalities, very subtle nonspecific T-wave changes in aVL.sinus rhythm, 73 with nonspecific ST-T wave changes     ASSESSMENT AND PLAN:  Shortness of breath -Chronic. 49% FEV1. [name]. Stable. He still remains quite active. -dyspnea on exertion was a previous anginal equivalent for him prior to his bypass surgery in 1999. Cardiac catheterization 2014 as above. Reassuring. It is likely that his underlying COPD as driving the majority of his dyspnea. (49% FEV1) Continue with medical management. Continue with current aggressive secondary prevention. Thankfully, nuclear stress perfusion did not show any significant defects.  COPD-[name] has been managing. Medications reviewed.   Hypertension-well-controlled  Angina-has not needed to use nitroglycerin for some time. Doing well..  Coronary artery disease-prior bypass surgery 1999. Echocardiogram with normal EF.  hyperlipidemia-lipids, continue current meds..  Diabetes-continue through primary care team  psychiatry- depression-  He has been on doxepin for several years.   We will see him back in 12 months  Signed, Candee Furbish, MD [facility]  11/27/2016 2:48 PM

## 2016-12-03 ENCOUNTER — Other Ambulatory Visit: Payer: Self-pay | Admitting: Cardiology

## 2016-12-03 ENCOUNTER — Telehealth: Payer: Self-pay

## 2016-12-03 MED ORDER — AMLODIPINE BESYLATE 5 MG PO TABS: 5.0000 mg | ORAL_TABLET | Freq: Every day | ORAL | 1 refills | Status: DC

## 2016-12-03 NOTE — Telephone Encounter (Signed)
Patient refill request sent to pharmacy

## 2016-12-04 ENCOUNTER — Telehealth: Payer: Self-pay

## 2016-12-04 NOTE — Telephone Encounter (Signed)
Reminder letter mailed to pt. 

## 2016-12-04 NOTE — Telephone Encounter (Signed)
-----   Message from Algernon Huxley, RN sent at 09/06/2016  2:35 PM EST ----- Regarding: Labs Pt needs labs, orders in epic

## 2016-12-10 ENCOUNTER — Other Ambulatory Visit (INDEPENDENT_AMBULATORY_CARE_PROVIDER_SITE_OTHER): Payer: Medicare Other

## 2016-12-10 DIAGNOSIS — E7800 Pure hypercholesterolemia, unspecified: Secondary | ICD-10-CM | POA: Diagnosis not present

## 2016-12-10 DIAGNOSIS — D509 Iron deficiency anemia, unspecified: Secondary | ICD-10-CM

## 2016-12-10 DIAGNOSIS — Z9641 Presence of insulin pump (external) (internal): Secondary | ICD-10-CM | POA: Diagnosis not present

## 2016-12-10 DIAGNOSIS — E1165 Type 2 diabetes mellitus with hyperglycemia: Secondary | ICD-10-CM | POA: Diagnosis not present

## 2016-12-10 DIAGNOSIS — E049 Nontoxic goiter, unspecified: Secondary | ICD-10-CM | POA: Diagnosis not present

## 2016-12-10 DIAGNOSIS — I10 Essential (primary) hypertension: Secondary | ICD-10-CM | POA: Diagnosis not present

## 2016-12-10 LAB — CBC WITH DIFFERENTIAL/PLATELET
BASOS PCT: 0.9 % (ref 0.0–3.0)
Basophils Absolute: 0.1 10*3/uL (ref 0.0–0.1)
EOS PCT: 2.8 % (ref 0.0–5.0)
Eosinophils Absolute: 0.2 10*3/uL (ref 0.0–0.7)
HCT: 39 % (ref 39.0–52.0)
Hemoglobin: 12.6 g/dL — ABNORMAL LOW (ref 13.0–17.0)
LYMPHS ABS: 2 10*3/uL (ref 0.7–4.0)
LYMPHS PCT: 28.7 % (ref 12.0–46.0)
MCHC: 32.3 g/dL (ref 30.0–36.0)
MCV: 87 fl (ref 78.0–100.0)
MONOS PCT: 10.8 % (ref 3.0–12.0)
Monocytes Absolute: 0.7 10*3/uL (ref 0.1–1.0)
NEUTROS ABS: 3.9 10*3/uL (ref 1.4–7.7)
NEUTROS PCT: 56.8 % (ref 43.0–77.0)
PLATELETS: 182 10*3/uL (ref 150.0–400.0)
RBC: 4.49 Mil/uL (ref 4.22–5.81)
RDW: 21.1 % — AB (ref 11.5–15.5)
WBC: 6.8 10*3/uL (ref 4.0–10.5)

## 2016-12-10 LAB — FERRITIN: Ferritin: 12.8 ng/mL — ABNORMAL LOW (ref 22.0–322.0)

## 2016-12-10 LAB — IBC PANEL
IRON: 111 ug/dL (ref 42–165)
Saturation Ratios: 28.5 % (ref 20.0–50.0)
TRANSFERRIN: 278 mg/dL (ref 212.0–360.0)

## 2016-12-14 ENCOUNTER — Other Ambulatory Visit: Payer: Self-pay

## 2016-12-14 DIAGNOSIS — D508 Other iron deficiency anemias: Secondary | ICD-10-CM | POA: Diagnosis not present

## 2016-12-14 DIAGNOSIS — D500 Iron deficiency anemia secondary to blood loss (chronic): Secondary | ICD-10-CM

## 2016-12-14 DIAGNOSIS — D509 Iron deficiency anemia, unspecified: Secondary | ICD-10-CM

## 2016-12-20 ENCOUNTER — Telehealth: Payer: Self-pay | Admitting: Internal Medicine

## 2016-12-20 NOTE — Telephone Encounter (Signed)
Pt states he has a bad sinus infection. Cancelled his capsule endo appt for Monday and states he will call and reschedule the procedure when he feels better. Dr. Hilarie Fredrickson notified.

## 2017-01-03 DIAGNOSIS — E02 Subclinical iodine-deficiency hypothyroidism: Secondary | ICD-10-CM | POA: Diagnosis not present

## 2017-01-03 DIAGNOSIS — E1165 Type 2 diabetes mellitus with hyperglycemia: Secondary | ICD-10-CM | POA: Diagnosis not present

## 2017-01-03 DIAGNOSIS — E7800 Pure hypercholesterolemia, unspecified: Secondary | ICD-10-CM | POA: Diagnosis not present

## 2017-01-09 ENCOUNTER — Other Ambulatory Visit: Payer: Self-pay | Admitting: Cardiology

## 2017-01-25 ENCOUNTER — Telehealth: Payer: Self-pay | Admitting: Emergency Medicine

## 2017-01-25 MED ORDER — PREDNISONE 10 MG PO TABS: ORAL_TABLET | ORAL | 0 refills | Status: DC

## 2017-01-25 NOTE — Telephone Encounter (Signed)
Would treat him for an acute exacerbation, also let him kno that if sx worsen in any way or if he has problems through the weekend he needs to go to the ED. If he is not improving next week then we need to see him   pred > Take 40mg  daily for 3 days, then 30mg  daily for 3 days, then 20mg  daily for 3 days, then 10mg  daily for 3 days, then stop

## 2017-01-25 NOTE — Telephone Encounter (Signed)
Spoke with pt. He is aware of RB's recommendations. Rx has been sent in. Nothing further was needed.

## 2017-01-25 NOTE — Telephone Encounter (Signed)
Called and spoke with pt and he stated that he thought that he was dying last night.  He stated that " this was the worst night of my life."  He was having SOB that woke up him last night.  He did use 2 neb tx throughout the night.  He stated that he was having chills, freezing and the only thing that he could do was take a hot shower to warm up.  He stated that he had dry heaves in the shower but never did throw up.  His stomach is sore today.  He did say that the symbicort helped him last night. He stated that he feels terrible today.  RB please advise. thanks

## 2017-01-28 ENCOUNTER — Other Ambulatory Visit: Payer: Self-pay | Admitting: Cardiology

## 2017-03-11 DIAGNOSIS — E049 Nontoxic goiter, unspecified: Secondary | ICD-10-CM | POA: Diagnosis not present

## 2017-03-11 DIAGNOSIS — E7800 Pure hypercholesterolemia, unspecified: Secondary | ICD-10-CM | POA: Diagnosis not present

## 2017-03-11 DIAGNOSIS — E1165 Type 2 diabetes mellitus with hyperglycemia: Secondary | ICD-10-CM | POA: Diagnosis not present

## 2017-03-11 DIAGNOSIS — E02 Subclinical iodine-deficiency hypothyroidism: Secondary | ICD-10-CM | POA: Diagnosis not present

## 2017-03-11 DIAGNOSIS — I10 Essential (primary) hypertension: Secondary | ICD-10-CM | POA: Diagnosis not present

## 2017-03-25 ENCOUNTER — Other Ambulatory Visit: Payer: Self-pay | Admitting: Emergency Medicine

## 2017-03-27 ENCOUNTER — Other Ambulatory Visit (INDEPENDENT_AMBULATORY_CARE_PROVIDER_SITE_OTHER): Payer: Medicare Other

## 2017-03-27 DIAGNOSIS — D509 Iron deficiency anemia, unspecified: Secondary | ICD-10-CM | POA: Diagnosis not present

## 2017-03-27 LAB — IBC PANEL
IRON: 86 ug/dL (ref 42–165)
SATURATION RATIOS: 23 % (ref 20.0–50.0)
TRANSFERRIN: 267 mg/dL (ref 212.0–360.0)

## 2017-03-27 LAB — CBC WITH DIFFERENTIAL/PLATELET
Basophils Absolute: 0 10*3/uL (ref 0.0–0.1)
Basophils Relative: 0.5 % (ref 0.0–3.0)
EOS PCT: 1.6 % (ref 0.0–5.0)
Eosinophils Absolute: 0.1 10*3/uL (ref 0.0–0.7)
HEMATOCRIT: 38.5 % — AB (ref 39.0–52.0)
HEMOGLOBIN: 12.7 g/dL — AB (ref 13.0–17.0)
LYMPHS PCT: 18.2 % (ref 12.0–46.0)
Lymphs Abs: 1.6 10*3/uL (ref 0.7–4.0)
MCHC: 32.9 g/dL (ref 30.0–36.0)
MCV: 97.5 fl (ref 78.0–100.0)
MONOS PCT: 10.8 % (ref 3.0–12.0)
Monocytes Absolute: 1 10*3/uL (ref 0.1–1.0)
Neutro Abs: 6.1 10*3/uL (ref 1.4–7.7)
Neutrophils Relative %: 68.9 % (ref 43.0–77.0)
Platelets: 196 10*3/uL (ref 150.0–400.0)
RBC: 3.95 Mil/uL — ABNORMAL LOW (ref 4.22–5.81)
RDW: 14.4 % (ref 11.5–15.5)
WBC: 8.8 10*3/uL (ref 4.0–10.5)

## 2017-03-27 LAB — FERRITIN: Ferritin: 19.4 ng/mL — ABNORMAL LOW (ref 22.0–322.0)

## 2017-03-29 ENCOUNTER — Other Ambulatory Visit: Payer: Self-pay

## 2017-03-29 DIAGNOSIS — D509 Iron deficiency anemia, unspecified: Secondary | ICD-10-CM

## 2017-04-01 ENCOUNTER — Ambulatory Visit: Payer: Medicare Other | Admitting: Emergency Medicine

## 2017-04-02 ENCOUNTER — Ambulatory Visit (INDEPENDENT_AMBULATORY_CARE_PROVIDER_SITE_OTHER): Payer: Medicare Other | Admitting: Emergency Medicine

## 2017-04-02 ENCOUNTER — Encounter: Payer: Self-pay | Admitting: Emergency Medicine

## 2017-04-02 DIAGNOSIS — I2510 Atherosclerotic heart disease of native coronary artery without angina pectoris: Secondary | ICD-10-CM | POA: Diagnosis not present

## 2017-04-02 DIAGNOSIS — J449 Chronic obstructive pulmonary disease, unspecified: Secondary | ICD-10-CM

## 2017-04-02 NOTE — Patient Instructions (Addendum)
Please continue your Spiriva and Symbicort as you are taking them  Keep albuterol available to use 2 puffs up to every 4 hours if needed for shortness of breath.  Flu shot today Follow with Dr [name] in 6 months or sooner if you have any problems

## 2017-04-02 NOTE — Assessment & Plan Note (Signed)
Please continue your Spiriva and Symbicort as you are taking them  Keep albuterol available to use 2 puffs up to every 4 hours if needed for shortness of breath.  Flu shot today Follow with Dr [name] in 6 months or sooner if you have any problems

## 2017-04-02 NOTE — Progress Notes (Signed)
[name] is a 71 year old gentleman with known coronary artery disease status post CABG. Also has a history of significant tobacco abuse and airflow limitation on pulmonary function testing. He has a positive bronchodilator response. Severe COPD. He has OSA, not on CPAP.   ROV 07/19/16 -- this is a follow-up visit for severe COPD. He also has a history of untreated obstructive sleep apnea and coronary artery disease. He had an acute exacerbation in October and was treated with antibiotics and prednisone. He improved to baseline. We have tried him in the past on Stiolto but he did not feel that this was any better then Spiriva and Symbicort. He's currently on the latter 2 medications. Feels that he is back to his baseline. He was exposed to a URI last week, has developed some scratchy throat, may have a cold coming. Uses albuterol a few times a week. He was able to work today.  ROV 11/13/16 -- patient with a history of severe COPD, untreated OSA. He returns today a for a regular follow-up visit.  He did well through the winter, did not have any flares, no URI, etc. Remains on Spiriva and Symbicort, feels that they work well. He exercises almost every day. He is getting ready to plant fields, drives the tractor. He rarely uses albuterol, but feels that he benefits when he does need it. Remains on nexium. not currently on allergy meds.   ROV 04/02/17 -- this follow-up visit for severe COPD, untreated obstructive sleep apnea. Also with GERD and allergic rhinitis. He has been managed on Spiriva and Symbicort. Uses albuterol rarely, usually he just rests and gets better.  He tells me that yesterday he was installing a door in the heat, developed significant dyspnea. He does continue to exercise, do treadmill. Minimal cough, mucous. Occasional wheeze. Had a flare with pred in June '18.    EXAM:  Vitals:   04/02/17 1518 04/02/17 1521  BP:  122/68  Pulse:  78  SpO2:  97%  Weight: 186 lb (84.4 kg)   Height: 5'  9" (1.753 m)    Gen: Pleasant, well-nourished, in no distress,  normal affect  ENT: No lesions,  mouth clear,  oropharynx clear, no postnasal drip  Neck: No JVD, no TMG, no carotid bruits  Lungs: No use of accessory muscles, no wheezes  Cardiovascular: RRR, heart sounds normal, no murmur or gallops, no peripheral edema  Musculoskeletal: No deformities, no cyanosis or clubbing  Neuro: alert, non focal  Skin: Warm, no lesions or rashes   COPD (chronic obstructive pulmonary disease) Please continue your Spiriva and Symbicort as you are taking them  Keep albuterol available to use 2 puffs up to every 4 hours if needed for shortness of breath.  Flu shot today Follow with Dr [name] in 6 months or sooner if you have any problems    Baltazar Apo, MD, PhD 04/02/2017, 3:48 PM [facility] [phone] or if no answer [phone]

## 2017-04-03 ENCOUNTER — Ambulatory Visit: Payer: Medicare Other | Admitting: Emergency Medicine

## 2017-04-04 ENCOUNTER — Other Ambulatory Visit: Payer: Self-pay | Admitting: Cardiology

## 2017-04-12 ENCOUNTER — Other Ambulatory Visit: Payer: Self-pay | Admitting: Internal Medicine

## 2017-04-22 DIAGNOSIS — J029 Acute pharyngitis, unspecified: Secondary | ICD-10-CM | POA: Diagnosis not present

## 2017-06-04 ENCOUNTER — Other Ambulatory Visit: Payer: Self-pay | Admitting: Cardiology

## 2017-06-06 ENCOUNTER — Other Ambulatory Visit: Payer: Self-pay | Admitting: Cardiology

## 2017-06-10 ENCOUNTER — Other Ambulatory Visit: Payer: Self-pay

## 2017-06-10 DIAGNOSIS — E049 Nontoxic goiter, unspecified: Secondary | ICD-10-CM | POA: Diagnosis not present

## 2017-06-10 DIAGNOSIS — E1165 Type 2 diabetes mellitus with hyperglycemia: Secondary | ICD-10-CM | POA: Diagnosis not present

## 2017-06-10 DIAGNOSIS — E02 Subclinical iodine-deficiency hypothyroidism: Secondary | ICD-10-CM | POA: Diagnosis not present

## 2017-06-10 DIAGNOSIS — E7800 Pure hypercholesterolemia, unspecified: Secondary | ICD-10-CM | POA: Diagnosis not present

## 2017-06-10 DIAGNOSIS — Z9641 Presence of insulin pump (external) (internal): Secondary | ICD-10-CM | POA: Diagnosis not present

## 2017-06-10 DIAGNOSIS — I10 Essential (primary) hypertension: Secondary | ICD-10-CM | POA: Diagnosis not present

## 2017-06-10 MED ORDER — PRAVASTATIN SODIUM 40 MG PO TABS: 40.0000 mg | ORAL_TABLET | Freq: Every day | ORAL | 3 refills | Status: DC

## 2017-06-16 ENCOUNTER — Other Ambulatory Visit: Payer: Self-pay | Admitting: Emergency Medicine

## 2017-07-19 ENCOUNTER — Other Ambulatory Visit (INDEPENDENT_AMBULATORY_CARE_PROVIDER_SITE_OTHER): Payer: Medicare Other

## 2017-07-19 DIAGNOSIS — D509 Iron deficiency anemia, unspecified: Secondary | ICD-10-CM

## 2017-07-19 LAB — CBC WITH DIFFERENTIAL/PLATELET
Basophils Absolute: 0 10*3/uL (ref 0.0–0.1)
Basophils Relative: 0.6 % (ref 0.0–3.0)
EOS ABS: 0.2 10*3/uL (ref 0.0–0.7)
EOS PCT: 2.9 % (ref 0.0–5.0)
HCT: 42.5 % (ref 39.0–52.0)
Hemoglobin: 13.8 g/dL (ref 13.0–17.0)
LYMPHS ABS: 2.1 10*3/uL (ref 0.7–4.0)
Lymphocytes Relative: 31.1 % (ref 12.0–46.0)
MCHC: 32.4 g/dL (ref 30.0–36.0)
MCV: 95.2 fl (ref 78.0–100.0)
Monocytes Absolute: 0.7 10*3/uL (ref 0.1–1.0)
Monocytes Relative: 11.2 % (ref 3.0–12.0)
NEUTROS PCT: 54.2 % (ref 43.0–77.0)
Neutro Abs: 3.6 10*3/uL (ref 1.4–7.7)
Platelets: 209 10*3/uL (ref 150.0–400.0)
RBC: 4.46 Mil/uL (ref 4.22–5.81)
RDW: 14.1 % (ref 11.5–15.5)
WBC: 6.6 10*3/uL (ref 4.0–10.5)

## 2017-07-19 LAB — IBC PANEL
IRON: 138 ug/dL (ref 42–165)
SATURATION RATIOS: 34.3 % (ref 20.0–50.0)
Transferrin: 287 mg/dL (ref 212.0–360.0)

## 2017-07-19 LAB — FERRITIN: Ferritin: 22 ng/mL (ref 22.0–322.0)

## 2017-07-22 ENCOUNTER — Other Ambulatory Visit: Payer: Self-pay

## 2017-07-22 DIAGNOSIS — D509 Iron deficiency anemia, unspecified: Secondary | ICD-10-CM

## 2017-09-30 ENCOUNTER — Ambulatory Visit (INDEPENDENT_AMBULATORY_CARE_PROVIDER_SITE_OTHER): Payer: Medicare Other | Admitting: Emergency Medicine

## 2017-09-30 ENCOUNTER — Encounter: Payer: Self-pay | Admitting: Emergency Medicine

## 2017-09-30 DIAGNOSIS — J449 Chronic obstructive pulmonary disease, unspecified: Secondary | ICD-10-CM | POA: Diagnosis not present

## 2017-09-30 NOTE — Patient Instructions (Addendum)
Please continue your Spiriva and Symbicort as you have been taking them. Keep albuterol available to use either 2 puffs or 1 nebulizer treatment as needed for shortness of breath, wheezing, chest tightness. Continue Nexium twice a day as you have been taking it. Follow-up with [name] in 6 months or sooner if you have any problems.

## 2017-09-30 NOTE — Assessment & Plan Note (Signed)
He does still have breathing limitations but overall he has been doing well.  He remains active.  He works and does have an exercise regimen.  We will continue Spiriva and Symbicort as he has been taking them.  He uses albuterol rarely.  We will refill his HFA today.

## 2017-09-30 NOTE — Progress Notes (Signed)
Oscar Robinson is a 72 year old gentleman with known coronary artery disease status post CABG. Also has a history of significant tobacco abuse and airflow limitation on pulmonary function testing. He has a positive bronchodilator response. Severe COPD. He has OSA, not on CPAP.   ROV 07/19/16 -- this is a follow-up visit for severe COPD. He also has a history of untreated obstructive sleep apnea and coronary artery disease. He had an acute exacerbation in October and was treated with antibiotics and prednisone. He improved to baseline. We have tried him in the past on Stiolto but he did not feel that this was any better then Spiriva and Symbicort. He's currently on the latter 2 medications. Feels that he is back to his baseline. He was exposed to a URI last week, has developed some scratchy throat, may have a cold coming. Uses albuterol a few times a week. He was able to work today.  ROV 11/13/16 -- patient with a history of severe COPD, untreated OSA. He returns today a for a regular follow-up visit.  He did well through the winter, did not have any flares, no URI, etc. Remains on Spiriva and Symbicort, feels that they work well. He exercises almost every day. He is getting ready to plant fields, drives the tractor. He rarely uses albuterol, but feels that he benefits when he does need it. Remains on nexium. not currently on allergy meds.   ROV 04/02/17 -- this follow-up visit for severe COPD, untreated obstructive sleep apnea. Also with GERD and allergic rhinitis. He has been managed on Spiriva and Symbicort. Uses albuterol rarely, usually he just rests and gets better.  He tells me that yesterday he was installing a door in the heat, developed significant dyspnea. He does continue to exercise, do treadmill. Minimal cough, mucous. Occasional wheeze. Had a flare with pred in June '18.   ROV 09/30/17 --patient has a history of severe COPD, untreated sleep apnea, allergic rhinitis, GERD.  I last saw him in August.  He  is currently managed on Spiriva and Symbicort.  He uses albuterol approximately once a month. He is having some cough productive of clear to white mucous. Waxes and wanes - some days are worse than other.  No clear pattern or exacerbators. His exercise routine has decreased some - he has been working more on guiding hunts at work.  No flares since last time.    EXAM:  Vitals:   09/30/17 1430  BP: 132/90  Pulse: 85  SpO2: 98%  Weight: 179 lb (81.2 kg)  Height: 5' 8.5" (1.74 m)   Gen: Pleasant, well-nourished, in no distress,  normal affect  ENT: No lesions,  mouth clear,  oropharynx clear, no postnasal drip  Neck: No JVD, no stridor  Lungs: No use of accessory muscles, no wheezes  Cardiovascular: RRR, heart sounds normal, no murmur or gallops, no peripheral edema  Musculoskeletal: No deformities, no cyanosis or clubbing  Neuro: alert, non focal  Skin: Warm, no lesions or rashes   COPD (chronic obstructive pulmonary disease) He does still have breathing limitations but overall he has been doing well.  He remains active.  He works and does have an exercise regimen.  We will continue Spiriva and Symbicort as he has been taking them.  He uses albuterol rarely.  We will refill his HFA today.    Baltazar Apo, MD, PhD 09/30/2017, 3:04 PM [facility] [phone] or if no answer [phone]

## 2017-10-04 ENCOUNTER — Other Ambulatory Visit: Payer: Self-pay | Admitting: Cardiology

## 2017-10-10 DIAGNOSIS — E1165 Type 2 diabetes mellitus with hyperglycemia: Secondary | ICD-10-CM | POA: Diagnosis not present

## 2017-10-10 DIAGNOSIS — E02 Subclinical iodine-deficiency hypothyroidism: Secondary | ICD-10-CM | POA: Diagnosis not present

## 2017-10-10 DIAGNOSIS — I10 Essential (primary) hypertension: Secondary | ICD-10-CM | POA: Diagnosis not present

## 2017-10-10 DIAGNOSIS — E7800 Pure hypercholesterolemia, unspecified: Secondary | ICD-10-CM | POA: Diagnosis not present

## 2017-10-10 DIAGNOSIS — Z9641 Presence of insulin pump (external) (internal): Secondary | ICD-10-CM | POA: Diagnosis not present

## 2017-10-10 DIAGNOSIS — E049 Nontoxic goiter, unspecified: Secondary | ICD-10-CM | POA: Diagnosis not present

## 2017-10-11 ENCOUNTER — Other Ambulatory Visit: Payer: Self-pay

## 2017-10-11 ENCOUNTER — Telehealth: Payer: Self-pay | Admitting: Cardiology

## 2017-10-11 MED ORDER — COLESEVELAM HCL 625 MG PO TABS: 1875.0000 mg | ORAL_TABLET | Freq: Two times a day (BID) | ORAL | 3 refills | Status: DC

## 2017-10-11 NOTE — Telephone Encounter (Signed)
The pt is advised that since we have no record of him having to take name brand Welchol and he does not know why he has to have brand name that I am sending a RX for generic Colesevelam to be filled at [facility]. He is advised to try and take the Colesevelam and if he has any unpleasant side effects or concerns to call us back and let us know so we can update his chart to show that he has to take brand name Welchol.  He verbalized understanding and thanked me for calling him.  I called the pts pharmacy, [facility], and was advised that the generic Colesevelam has just become available and that is why the pt got the letter from his insurance company.  I called the pt back and explained that the reason he has always gotten name brand Welchol is because Colesevelam has just become available. He verbalized understanding and again thanked me for calling him.

## 2017-10-11 NOTE — Telephone Encounter (Signed)
Walk In pt Form-[employer] paper dropped off placed in Dr.Skains doc box.

## 2017-10-24 DIAGNOSIS — J209 Acute bronchitis, unspecified: Secondary | ICD-10-CM | POA: Diagnosis not present

## 2017-11-07 ENCOUNTER — Telehealth: Payer: Self-pay | Admitting: *Deleted

## 2017-11-07 NOTE — Telephone Encounter (Addendum)
Prior authorization done for patients Oscar Robinson, sent to [employer]. Prior note in 2019 states patient can take generic COLESEVELAM. Noted this on PA.

## 2017-11-07 NOTE — Telephone Encounter (Signed)
Received fax from [employer] that a prior authorization for COLESEVELAM is not needed. Will notify pharmacy.

## 2017-12-03 ENCOUNTER — Other Ambulatory Visit: Payer: Self-pay | Admitting: Cardiology

## 2017-12-13 ENCOUNTER — Other Ambulatory Visit: Payer: Self-pay | Admitting: Emergency Medicine

## 2018-01-06 ENCOUNTER — Other Ambulatory Visit: Payer: Self-pay | Admitting: Cardiology

## 2018-01-15 ENCOUNTER — Other Ambulatory Visit: Payer: Self-pay | Admitting: Cardiology

## 2018-01-20 ENCOUNTER — Other Ambulatory Visit: Payer: Self-pay | Admitting: Cardiology

## 2018-01-21 ENCOUNTER — Other Ambulatory Visit (INDEPENDENT_AMBULATORY_CARE_PROVIDER_SITE_OTHER): Payer: Medicare Other

## 2018-01-21 ENCOUNTER — Other Ambulatory Visit: Payer: Self-pay | Admitting: Cardiology

## 2018-01-21 DIAGNOSIS — D509 Iron deficiency anemia, unspecified: Secondary | ICD-10-CM | POA: Diagnosis not present

## 2018-01-21 LAB — CBC WITH DIFFERENTIAL/PLATELET
BASOS ABS: 0.1 10*3/uL (ref 0.0–0.1)
Basophils Relative: 0.9 % (ref 0.0–3.0)
EOS ABS: 0.2 10*3/uL (ref 0.0–0.7)
Eosinophils Relative: 3 % (ref 0.0–5.0)
HCT: 41.3 % (ref 39.0–52.0)
HEMOGLOBIN: 13.7 g/dL (ref 13.0–17.0)
Lymphocytes Relative: 26.6 % (ref 12.0–46.0)
Lymphs Abs: 1.9 10*3/uL (ref 0.7–4.0)
MCHC: 33.1 g/dL (ref 30.0–36.0)
MCV: 93.7 fl (ref 78.0–100.0)
Monocytes Absolute: 0.8 10*3/uL (ref 0.1–1.0)
Monocytes Relative: 10.6 % (ref 3.0–12.0)
Neutro Abs: 4.2 10*3/uL (ref 1.4–7.7)
Neutrophils Relative %: 58.9 % (ref 43.0–77.0)
Platelets: 229 10*3/uL (ref 150.0–400.0)
RBC: 4.41 Mil/uL (ref 4.22–5.81)
RDW: 14.5 % (ref 11.5–15.5)
WBC: 7.1 10*3/uL (ref 4.0–10.5)

## 2018-01-21 LAB — IBC PANEL
Iron: 80 ug/dL (ref 42–165)
Saturation Ratios: 19.5 % — ABNORMAL LOW (ref 20.0–50.0)
Transferrin: 293 mg/dL (ref 212.0–360.0)

## 2018-01-21 LAB — FERRITIN: Ferritin: 17.1 ng/mL — ABNORMAL LOW (ref 22.0–322.0)

## 2018-01-22 ENCOUNTER — Other Ambulatory Visit: Payer: Self-pay

## 2018-01-22 DIAGNOSIS — D509 Iron deficiency anemia, unspecified: Secondary | ICD-10-CM

## 2018-01-27 ENCOUNTER — Telehealth: Payer: Self-pay | Admitting: Cardiology

## 2018-01-27 NOTE — Telephone Encounter (Signed)
New Message   Pt c/o Shortness Of Breath: STAT if SOB developed within the last 24 hours or pt is noticeably SOB on the phone  1. Are you currently SOB (can you hear that pt is SOB on the phone)? no  2. How long have you been experiencing SOB? Always had it but its gotten worse   3. Are you SOB when sitting or when up moving around? Moving around  4. Are you currently experiencing any other symptoms? Fatigue

## 2018-01-27 NOTE — Telephone Encounter (Signed)
Spoke with patient who is c/o increasing SOB and fatigue.  He denies any CP but is concerned b/c his CABG "was 20 yrs ago."  He is unsure if his SOB and fatigue are from something going on with his heart or if it's r/t his severe COPD.  He continues to use medications as listed and by his report, they are still effective.  He reports his s/s have been getting worse for the past 6 months and wants to be seen ASAP for evaluation.  Scheduled appt at 11 am 6/25 with Melina Copa.  Pt is aware and states understanding.

## 2018-01-28 ENCOUNTER — Encounter: Payer: Self-pay | Admitting: Physician Assistant

## 2018-01-28 ENCOUNTER — Other Ambulatory Visit: Payer: Self-pay

## 2018-01-28 ENCOUNTER — Ambulatory Visit (INDEPENDENT_AMBULATORY_CARE_PROVIDER_SITE_OTHER): Payer: Medicare Other | Admitting: Physician Assistant

## 2018-01-28 ENCOUNTER — Ambulatory Visit
Admission: RE | Admit: 2018-01-28 | Discharge: 2018-01-28 | Disposition: A | Discharge status: Home / Self Care | Payer: Medicare Other | Source: Ambulatory Visit | Attending: Physician Assistant | Admitting: Physician Assistant

## 2018-01-28 ENCOUNTER — Ambulatory Visit (HOSPITAL_COMMUNITY): Payer: Medicare Other | Attending: Cardiovascular Disease

## 2018-01-28 VITALS — BP 132/76 | HR 74 | Ht 68.5 in | Wt 184.0 lb

## 2018-01-28 DIAGNOSIS — E785 Hyperlipidemia, unspecified: Secondary | ICD-10-CM | POA: Diagnosis not present

## 2018-01-28 DIAGNOSIS — Z8249 Family history of ischemic heart disease and other diseases of the circulatory system: Secondary | ICD-10-CM | POA: Insufficient documentation

## 2018-01-28 DIAGNOSIS — R5383 Other fatigue: Secondary | ICD-10-CM | POA: Diagnosis not present

## 2018-01-28 DIAGNOSIS — R0600 Dyspnea, unspecified: Secondary | ICD-10-CM | POA: Diagnosis not present

## 2018-01-28 DIAGNOSIS — R0602 Shortness of breath: Secondary | ICD-10-CM

## 2018-01-28 DIAGNOSIS — E1122 Type 2 diabetes mellitus with diabetic chronic kidney disease: Secondary | ICD-10-CM | POA: Insufficient documentation

## 2018-01-28 DIAGNOSIS — I2510 Atherosclerotic heart disease of native coronary artery without angina pectoris: Secondary | ICD-10-CM

## 2018-01-28 DIAGNOSIS — J449 Chronic obstructive pulmonary disease, unspecified: Secondary | ICD-10-CM | POA: Diagnosis not present

## 2018-01-28 DIAGNOSIS — G4733 Obstructive sleep apnea (adult) (pediatric): Secondary | ICD-10-CM | POA: Insufficient documentation

## 2018-01-28 DIAGNOSIS — I10 Essential (primary) hypertension: Secondary | ICD-10-CM | POA: Diagnosis not present

## 2018-01-28 DIAGNOSIS — Z951 Presence of aortocoronary bypass graft: Secondary | ICD-10-CM | POA: Diagnosis not present

## 2018-01-28 DIAGNOSIS — Z8673 Personal history of transient ischemic attack (TIA), and cerebral infarction without residual deficits: Secondary | ICD-10-CM | POA: Insufficient documentation

## 2018-01-28 DIAGNOSIS — N189 Chronic kidney disease, unspecified: Secondary | ICD-10-CM | POA: Insufficient documentation

## 2018-01-28 DIAGNOSIS — Z87891 Personal history of nicotine dependence: Secondary | ICD-10-CM | POA: Diagnosis not present

## 2018-01-28 DIAGNOSIS — I129 Hypertensive chronic kidney disease with stage 1 through stage 4 chronic kidney disease, or unspecified chronic kidney disease: Secondary | ICD-10-CM | POA: Diagnosis not present

## 2018-01-28 LAB — ECHOCARDIOGRAM COMPLETE
Height: 68.5 in
Weight: 2944 oz

## 2018-01-28 MED ORDER — ISOSORBIDE MONONITRATE ER 30 MG PO TB24: 30.0000 mg | ORAL_TABLET | Freq: Every day | ORAL | 3 refills | Status: DC

## 2018-01-28 NOTE — Patient Instructions (Addendum)
Medication Instructions:  Your physician has recommended you make the following change in your medication: 1.  START Imdur 30 mg taking 1 tablet daily   Labwork: TODAY;  BMET, PRO BNP, TSH, CBC, & FREE T4  Testing/Procedures: Your physician has requested that you have an echocardiogram. Echocardiography is a painless test that uses sound waves to create images of your heart. It provides your doctor with information about the size and shape of your heart and how well your heart's chambers and valves are working. This procedure takes approximately one hour. There are no restrictions for this procedure.  A chest x-ray takes a picture of the organs and structures inside the chest, including the heart, lungs, and blood vessels. This test can show several things, including, whether the heart is enlarges; whether fluid is building up in the lungs; and whether pacemaker / defibrillator leads are still in place.   Follow-Up: Your physician recommends that you schedule a follow-up appointment in: 1-2 WEEKS AFTER THE ECHOCARDIOGRAM, WITH DR. Marlou Porch OR AN APP ON HIS CARE TEAM WHEN HE IS IN THE OFFICE.   Any Other Special Instructions Will Be Listed Below (If Applicable).  Echocardiogram An echocardiogram, or echocardiography, uses sound waves (ultrasound) to produce an image of your heart. The echocardiogram is simple, painless, obtained within a short period of time, and offers valuable information to your health care provider. The images from an echocardiogram can provide information such as:  Evidence of coronary artery disease (CAD).  Heart size.  Heart muscle function.  Heart valve function.  Aneurysm detection.  Evidence of a past heart attack.  Fluid buildup around the heart.  Heart muscle thickening.  Assess heart valve function.  Tell a health care provider about:  Any allergies you have.  All medicines you are taking, including vitamins, herbs, eye drops, creams, and  over-the-counter medicines.  Any problems you or family members have had with anesthetic medicines.  Any blood disorders you have.  Any surgeries you have had.  Any medical conditions you have.  Whether you are pregnant or may be pregnant. What happens before the procedure? No special preparation is needed. Eat and drink normally. What happens during the procedure?  In order to produce an image of your heart, gel will be applied to your chest and a wand-like tool (transducer) will be moved over your chest. The gel will help transmit the sound waves from the transducer. The sound waves will harmlessly bounce off your heart to allow the heart images to be captured in real-time motion. These images will then be recorded.  You may need an IV to receive a medicine that improves the quality of the pictures. What happens after the procedure? You may return to your normal schedule including diet, activities, and medicines, unless your health care provider tells you otherwise. This information is not intended to replace advice given to you by your health care provider. Make sure you discuss any questions you have with your health care provider. Document Released: 07/20/2000 Document Revised: 03/10/2016 Document Reviewed: 03/30/2013 Elsevier Interactive Patient Education  2017 [place].    Chest X-Ray A chest X-ray is a painless test that uses radiation to create images of the structures inside of your chest. Chest X-rays are used to look for many health conditions, including heart failure, pneumonia, tuberculosis, rib fractures, breathing disorders, and cancer. They may be used to diagnose chest pain, constant coughing, or trouble breathing. Tell a health care provider about:  Any allergies you have.  All medicines you are taking, including vitamins, herbs, eye drops, creams, and over-the-counter medicines.  Any surgeries you have had.  Any medical conditions you have.  Whether you  are pregnant or may be pregnant. What are the risks? Getting a chest X-ray is a safe procedure. However, you will be exposed to a small amount of radiation. Being exposed to too much radiation over a lifetime can increase the risk of cancer. This risk is small, but it may occur if you have many X-rays throughout your life. What happens before the procedure?  You may be asked to remove glasses, jewelry, and any other metal objects.  You will be asked to undress from the waist up. You may be given a hospital gown to wear.  You may be asked to wear a protective lead apron to protect parts of your body from radiation. What happens during the procedure?  You will be asked to stand still as each picture is taken to get the best possible images.  You will be asked to take a deep breath and hold your breath for a few seconds.  The X-ray machine will create a picture of your chest using a tiny burst of radiation. This is painless.  More pictures may be taken from other angles. Typically, one picture will be taken while you face the X-ray camera, and another picture will be taken from the side while you stand. If you cannot stand, you may be asked to lie down. The procedure may vary among health care providers and hospitals. What happens after the procedure?  The X-ray(s) will be reviewed by your health care provider or an X-ray (radiology) specialist.  It is up to you to get your test results. Ask your health care provider, or the department that is doing the test, when your results will be ready.  Your health care provider will tell you if you need more tests or a follow-up exam. Keep all follow-up visits as told by your health care provider. This is important. Summary  A chest X-ray is a safe, painless test that is used to examine the inside of the chest, heart, and lungs.  You will need to undress from the waist up and remove jewelry and metal objects before the procedure.  You will be  exposed to a small amount of radiation during the procedure.  The X-ray machine will take one or more pictures of your chest while you remain as still as possible.  Later, a health care provider or specialist will review the test results with you. This information is not intended to replace advice given to you by your health care provider. Make sure you discuss any questions you have with your health care provider. Document Released: 09/18/2016 Document Revised: 09/18/2016 Document Reviewed: 09/18/2016 Elsevier Interactive Patient Education  [employer].  If you need a refill on your cardiac medications before your next appointment, please call your pharmacy.

## 2018-01-28 NOTE — Progress Notes (Addendum)
Cardiology Office Note    Date:  01/28/2018  ID:  Oscar Robinson, DOB 1945-12-18, MRN [id number] PCP:  [name], MD  Cardiologist:  Candee Furbish, MD   Chief Complaint: shortness of breath  History of Present Illness:  Oscar Robinson is a 72 y.o. male with history of CAD s/p CABG 1999, longstanding tobacco abuse, ?CKD stage II, COPD, h/o dysphagia, iron deficiency, anxiety, arthritis, depression, DM, diverticulosis, GERD, hiatal hernia, HTN, HLD who presents for evaluation of SOB and fatigue. He is simultaneously followed by pulm for significant COPD. Last cath 06/2013 - native vessel LAD/diagonal bifurcation disease with patent LIMA to LAD/diagonal albeit small caliber vessel, normal perfusion pattern on nuclear stress, (Concern was ST segment depression as well as decrease in blood pressure during exercise, dyspnea), normal LVEDP and EF 60%. He underwent cardiopulmonary exercise test on 07/08/15 which showed fairly significant ventilatory obstruction. Last echo 12/2015 showed 32-95%, normal diastolic parameters, mitral valve systolic bowing w/o prolapse. Last labs 01/2018: Hgb 13.7, 2017 - TSH 4.56, Cr 1.26, K 4.7, glucose 140, LFTs OK, LDL 51.  He last saw Dr. Marlou Porch 11/2016 at which time continued medical therapy was recommended for his dyspnea for which COPD was felt to be a significant driver. He was advised to f/u in 1 year but he is frustrated he does not believe he was contacted to schedule this. 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 He continues to walk on a treadmill regularly at 33mph and also does the elliptical. However, he reports since 10/2017 he feels more SOB with exertion and more worn out after activity. He also recalls 2 nights coming back to bed from the bathroom where he was more SOB than usual and had to take 2 neb treatments. He denies any CP. He does get mild LE edema which he states is unchanged. Denies any change in weight although this is up  5lb by our system. He has faint wheezing on exam which he states is common. No hemoptysis.  Past Medical History:  Diagnosis Date  . Anemia   . Anxiety   . Arthritis   . CKD (chronic kidney disease), stage II   . COPD (chronic obstructive pulmonary disease) ([place])   . Coronary artery disease    a. s/p CABG 1999.  . Depression   . Diabetes mellitus   . Diverticulosis   . GERD (gastroesophageal reflux disease)   . Hiatal hernia   . Hypercholesteremia   . Hypertension   . Sleep apnea    had sleep study and negative per pt  . Thyroid disease   . Tobacco abuse   . Tubular adenoma of colon     Past Surgical History:  Procedure Laterality Date  . COLONOSCOPY    . CORONARY ARTERY BYPASS GRAFT  1999  . 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 HAND SURGERY     lt palm,ring finger,  . HAND SURGERY Left    pinky finger  . HERNIA REPAIR  1996   lt/rt ing hernia  . LEFT HEART CATHETERIZATION WITH CORONARY/GRAFT ANGIOGRAM N/A 06/15/2013   Procedure: LEFT HEART CATHETERIZATION WITH Beatrix Fetters;  Surgeon: Candee Furbish, MD;  Location[facility];  Service: Cardiovascular;  Laterality: N/A;  . UPPER GASTROINTESTINAL ENDOSCOPY  Current Medications: Current Meds  Medication Sig  . albuterol (PROAIR HFA) 108 (90 BASE) MCG/ACT inhaler Inhale 2 puffs into the lungs every 4 (four) hours as needed for wheezing or shortness of breath.  Marland Kitchen albuterol (PROVENTIL) (2.5 MG/3ML) 0.083% nebulizer solution Take 3 mLs (2.5 mg total) by nebulization every 6 (six) hours.  . ALPRAZolam (XANAX) 0.5 MG tablet Take 0.5 mg by mouth at bedtime.   Marland Kitchen amLODipine (NORVASC) 5 MG tablet TAKE 1 TABLET BY MOUTH ONCE DAILY  . aspirin 81 MG tablet Take 162 mg by mouth daily.   .  Coenzyme Q10 (CO Q 10) 100 MG CAPS Take 100 mg by mouth daily.  . colesevelam (WELCHOL) 625 MG tablet Take 3 tablets (1,875 mg total) by mouth 2 (two) times daily with a meal.  . doxepin (SINEQUAN) 50 MG capsule Take 100 mg by mouth daily.  Marland Kitchen esomeprazole (NEXIUM) 20 MG capsule Take 20 mg by mouth 2 (two) times daily.  . Fe Fum-FePoly-Vit C-Vit B3 (INTEGRA) 62.5-62.5-40-3 MG CAPS TAKE ONE CAPSULE BY MOUTH ONCE DAILY  . HUMALOG 100 UNIT/ML injection With meals/has a pump  . Iron-Vitamins (GERITOL COMPLETE) TABS Take 1 tablet by mouth daily.  Marland Kitchen levothyroxine (SYNTHROID, LEVOTHROID) 50 MCG tablet Take 1 tablet by mouth every morning.  Marland Kitchen losartan (COZAAR) 50 MG tablet TAKE 1 TABLET BY MOUTH ONCE DAILY  . metFORMIN (GLUCOPHAGE) 500 MG tablet Take 500 mg by mouth 2 (two) times daily.   . metoprolol succinate (TOPROL-XL) 50 MG 24 hr tablet TAKE 1 TABLET BY MOUTH ONCE DAILY WITH MEAL OR IMMEDIATELY FOLLOWING A MEAL  . nitroGLYCERIN (NITROSTAT) 0.4 MG SL tablet Place 1 tablet (0.4 mg total) under the tongue every 5 (five) minutes as needed for chest pain.  . Omega-3 Fatty Acids (FISH OIL PO) Take 350 mg by mouth 2 (two) times daily.  . pravastatin (PRAVACHOL) 40 MG tablet Take 1 tablet (40 mg total) daily by mouth.  . SPIRIVA HANDIHALER 18 MCG inhalation capsule INHALE ONE DOSE BY MOUTH ONCE DAILY  . SYMBICORT 160-4.5 MCG/ACT inhaler INHALE 2 PUFFS BY MOUTH EVERY 12 HOURS RINSE  MOUTH  AFTER  EVERY  USE   Current Facility-Administered Medications for the 01/28/18 encounter (Office Visit) with Oscar Pitter, PA-C  Medication  . 0.9 %  sodium chloride infusion    Allergies:   Patient has no known allergies.   Social History   Socioeconomic History  . Marital status: Divorced    Spouse name: Not on file  . Number of children: 1  . Years of education: Not on file  . Highest education level: Not on file  Occupational History  . Occupation: Copywriter, advertising  Social Needs    . Financial resource strain: Not on file  . Food insecurity:    Worry: Not on file    Inability: Not on file  . Transportation needs:    Medical: Not on file    Non-medical: Not on file  Tobacco Use  . Smoking status: Former Smoker    Packs/day: 2.00    Years: 35.00    Pack years: 70.00    Types: Cigarettes    Last attempt to quit: 09/27/1997    Years since quitting: 20.3  . Smokeless tobacco: Never Used  Substance and Sexual Activity  . Alcohol use: Yes    Comment: occ  . Drug use: No  . Sexual activity: Not on file  Lifestyle  . Physical activity:  Days per week: Not on file    Minutes per session: Not on file  . Stress: Not on file  Relationships  . Social connections:    Talks on phone: Not on file    Gets together: Not on file    Attends religious service: Not on file    Active member of club or organization: Not on file    Attends meetings of clubs or organizations: Not on file    Relationship status: Not on file  Other Topics Concern  . Not on file  Social History Narrative  . Not on file     Family History:  The patient's family history includes Drug abuse in his brother. There is no history of Heart disease, Heart failure, Diabetes, Hypertension, Colon cancer, Esophageal cancer, Prostate cancer, Pancreatic cancer, Kidney disease, Liver disease, Stomach cancer, or Rectal cancer.  ROS:   Please see the history of present illness.  All other systems are reviewed and otherwise negative.    PHYSICAL EXAM:   VS:  BP 132/76   Pulse 74   Ht 5' 8.5" (1.74 m)   Wt 184 lb (83.5 kg)   SpO2 96%   BMI 27.57 kg/m   BMI: Body mass index is 27.57 kg/m. GEN: Well nourished, well developed WM, in no acute distress HEENT: normocephalic, atraumatic Neck: no JVD, carotid bruits, or masses Cardiac: RRR; no murmurs, rubs, or gallops, trace BLE pitting edema  Respiratory:  Moderate air movement but faint expiratory wheezing noted throughout, normal work of  breathing GI: soft, nontender, nondistended, + BS MS: no deformity or atrophy Skin: warm and dry, no rash Neuro:  Alert and Oriented x 3, Strength and sensation are intact, follows commands Psych: euthymic mood, full affect  Wt Readings from Last 3 Encounters:  01/28/18 184 lb (83.5 kg)  09/30/17 179 lb (81.2 kg)  04/02/17 186 lb (84.4 kg)      Studies/Labs Reviewed:   EKG:  EKG was ordered today and personally reviewed by me and demonstrates NSR 74bpm, nonspecific T wave changes similar to 2018  Recent Labs: 01/21/2018: Hemoglobin 13.7; Platelets 229.0   Lipid Panel    Component Value Date/Time   CHOL 127 06/05/2016 1055   TRIG 89 06/05/2016 1055   HDL 58 06/05/2016 1055   CHOLHDL 2.2 06/05/2016 1055   VLDL 18 06/05/2016 1055   LDLCALC 51 06/05/2016 1055    Additional studies/ records that were reviewed today include: Summarized above.   ASSESSMENT & PLAN:   1. Shortness of breath/fatigue - in this patient population it is difficult to discern cardiac vs pulm pathology. Traditionally this has been felt driven by COPD, but he reports a change in symptoms since 10/2017. In 2014 his stress EKG was abnormal but perfusion was normal. I considered repeat testing but the patient really felt the treadmill was cumbersome at higher inclines/speeds - he is willing to try if needed but would like to avoid if possible. He is a poor candidate for Lexiscan given his active wheezing and I'm not sure he would tolerate dobutamine well but it does remain an option. Cardiac CTA could potentially be another option for evaluation. I reviewed case with Dr. Curt Bears (DOD). Will plan a trial of Imdur 30mg  daily for anti-anginal therapy, check labs, echo and CXR. If workup otherwise unrevealing and no response to Imdur, would recommend discussing how feasible cardiac CT would be in patient (versus proceeding with updated cath for definitive evaluation). Patient feels well today. ER precautions reviewed. I  told him I also thought it would be helpful for him to update [name] on how he's been feeling especially in light of wheezing - will route this note his way as well. 2. CAD - as above, add Imdur. 3. HTN - BP upper limits of normal, follow with addition of Imdur. 4. Hyperlipidemia - it is not clear to me why he is on pravastatin as opposed to more potent statin, but would prefer to make one med change at a time given his generalized fatigue. Can consider changing to atorvastatin in the future. Lipids are followed by PCP.  Disposition: F/u with Dr. Wilber Oliphant team APP/me in 1-2 weeks after testing.   Medication Adjustments/Labs and Tests Ordered: Current medicines are reviewed at length with the patient today.  Concerns regarding medicines are outlined above. Medication changes, Labs and Tests ordered today are summarized above and listed in the Patient Instructions accessible in Encounters.   Signed, Oscar Pitter, PA-C  01/28/2018 11:22 AM    [facility] [address], [place], [place]  [address] Phone: [phone]; Fax: [phone]

## 2018-01-29 ENCOUNTER — Other Ambulatory Visit: Payer: Medicare Other | Admitting: *Deleted

## 2018-01-29 DIAGNOSIS — R0602 Shortness of breath: Secondary | ICD-10-CM | POA: Diagnosis not present

## 2018-01-29 DIAGNOSIS — R5383 Other fatigue: Secondary | ICD-10-CM | POA: Diagnosis not present

## 2018-01-30 ENCOUNTER — Ambulatory Visit (INDEPENDENT_AMBULATORY_CARE_PROVIDER_SITE_OTHER): Payer: Medicare Other | Admitting: Emergency Medicine

## 2018-01-30 ENCOUNTER — Encounter: Payer: Self-pay | Admitting: Emergency Medicine

## 2018-01-30 ENCOUNTER — Other Ambulatory Visit: Payer: Self-pay | Admitting: Cardiology

## 2018-01-30 DIAGNOSIS — G4733 Obstructive sleep apnea (adult) (pediatric): Secondary | ICD-10-CM | POA: Diagnosis not present

## 2018-01-30 DIAGNOSIS — I2510 Atherosclerotic heart disease of native coronary artery without angina pectoris: Secondary | ICD-10-CM

## 2018-01-30 DIAGNOSIS — J449 Chronic obstructive pulmonary disease, unspecified: Secondary | ICD-10-CM

## 2018-01-30 DIAGNOSIS — F329 Major depressive disorder, single episode, unspecified: Secondary | ICD-10-CM

## 2018-01-30 DIAGNOSIS — R0602 Shortness of breath: Secondary | ICD-10-CM

## 2018-01-30 DIAGNOSIS — F32A Depression, unspecified: Secondary | ICD-10-CM

## 2018-01-30 LAB — CBC
Hematocrit: 38.4 % (ref 37.5–51.0)
Hemoglobin: 13.2 g/dL (ref 13.0–17.7)
MCH: 32.4 pg (ref 26.6–33.0)
MCHC: 34.4 g/dL (ref 31.5–35.7)
MCV: 94 fL (ref 79–97)
PLATELETS: 253 10*3/uL (ref 150–450)
RBC: 4.08 x10E6/uL — ABNORMAL LOW (ref 4.14–5.80)
RDW: 14.2 % (ref 12.3–15.4)
WBC: 5.7 10*3/uL (ref 3.4–10.8)

## 2018-01-30 LAB — T4, FREE: FREE T4: 1.11 ng/dL (ref 0.82–1.77)

## 2018-01-30 LAB — BASIC METABOLIC PANEL
BUN/Creatinine Ratio: 13 (ref 10–24)
BUN: 15 mg/dL (ref 8–27)
CO2: 23 mmol/L (ref 20–29)
Calcium: 9.3 mg/dL (ref 8.6–10.2)
Chloride: 105 mmol/L (ref 96–106)
Creatinine, Ser: 1.15 mg/dL (ref 0.76–1.27)
GFR, EST AFRICAN AMERICAN: 74 mL/min/{1.73_m2} (ref >59)
GFR, EST NON AFRICAN AMERICAN: 64 mL/min/{1.73_m2} (ref >59)
Glucose: 131 mg/dL — ABNORMAL HIGH (ref 65–99)
Potassium: 4.8 mmol/L (ref 3.5–5.2)
SODIUM: 141 mmol/L (ref 134–144)

## 2018-01-30 LAB — PRO B NATRIURETIC PEPTIDE: NT-Pro BNP: 73 pg/mL (ref 0–376)

## 2018-01-30 LAB — TSH: TSH: 3.78 u[IU]/mL (ref 0.450–4.500)

## 2018-01-30 MED ORDER — FLUTICASONE-UMECLIDIN-VILANT 100-62.5-25 MCG/INH IN AEPB: 1.0000 | INHALATION_SPRAY | Freq: Every day | RESPIRATORY_TRACT | 0 refills | Status: DC

## 2018-01-30 NOTE — Assessment & Plan Note (Signed)
There is a lot of crossover between his shortness of breath, and malaise/lack of motivation.  He brings this up and wonders if depression is part of his overall syndrome.  This may be the case but it does appear that he has true dyspnea with certain exercise.  Hard to blame this on depression.

## 2018-01-30 NOTE — Assessment & Plan Note (Signed)
It is been a long time since we address this.  Question whether his fatigue, lack of energy may relate to untreated OSA.  Could consider reevaluating depending on the work-up as we go forward.

## 2018-01-30 NOTE — Progress Notes (Signed)
Oscar Robinson is a 72 year old gentleman with known coronary artery disease status post CABG. Also has a history of significant tobacco abuse and airflow limitation on pulmonary function testing. Oscar Robinson has a positive bronchodilator response. Severe COPD. Oscar Robinson has OSA, not on CPAP.   ROV 09/30/17 --patient has a history of severe COPD, untreated sleep apnea, allergic rhinitis, GERD.  Oscar Robinson last saw Oscar Robinson in August.  Oscar Robinson is currently managed on Spiriva and Symbicort.  Oscar Robinson uses albuterol approximately once a month. Oscar Robinson is having some cough productive of clear to white mucous. Waxes and wanes - some days are worse than other.  No clear pattern or exacerbators. 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  No flares since last time.   Acute OV 01/30/18 --this is an acute office visit for 72 year old man who Oscar Robinson follow for severe COPD.  Oscar Robinson also has untreated obstructive sleep apnea, CAD/CABG (1999), iron deficiency, hypertension, allergic rhinitis and GERD. Oscar Robinson reports that Oscar Robinson has had more exertional dyspnea, had a very active March and around that time Oscar Robinson noted some progressive exertional SOB. Oscar Robinson feels lack of energy - wonders to himself if maybe Oscar Robinson may have some depression. Oscar Robinson notices dyspnea with raking, with walking an incline. Oscar Robinson is on spiriva and symbicort. Oscar Robinson can't remember when his last flare was. Oscar Robinson coughs very rarely.   Last cardiopulmonary exercise test was 07/08/2015, Showed that most of his exercise limitation was due to to ventilatory defect  Echocardiogram from 01/28/2018 was reviewed today and shows intact left ventricular function with a normal RV size and function, estimated PASP 23 mmHg.   CBC, TSH and BMP normal on 01/30/18 CXR 6/25 >> hyperinflated, no infiltrates.    EXAM:  Vitals:   01/30/18 1622  BP: 130/86  Pulse: 81  SpO2: 96%  Weight: 183 lb 12.8 oz (83.4 kg)  Height: 5\' 8"  (1.727 m)   Gen: Pleasant, well-nourished, in no distress,  normal  affect  ENT: No lesions,  mouth clear,  oropharynx clear, no postnasal drip  Neck: No JVD, no stridor  Lungs: No use of accessory muscles, no wheezes  Cardiovascular: RRR, heart sounds normal, no murmur or gallops, no peripheral edema  Musculoskeletal: No deformities, no cyanosis or clubbing  Neuro: alert, non focal  Skin: Warm, no lesions or rashes   Dyspnea Continued dyspnea on exertion.  Oscar Robinson is quite active, is able to walk indefinitely, do chores around the farm, lead hunts, but Oscar Robinson is also able to elicit dyspnea with all this activity.  Oscar Robinson believes that his functional capacity is decreased and Oscar Robinson notices this especially since March of this year.  His LV function is stable by echocardiogram, CBC, TSH, BMP and chest x-ray are all reassuring.  I do believe that his limitation is most likely due to his severe COPD.  Oscar Robinson explained to Oscar Robinson that at some point the severity of his obstruction is going to limit his activity.  We both hope that now is not the time.  Oscar Robinson will check a walking oximetry with vigorous walking today to see if Oscar Robinson can make Oscar Robinson desaturate  If so then oxygen with heavy activity may be indicated.  Oscar Robinson will also try changing his Spiriva and Symbicort over to Trelegy to see if Oscar Robinson will benefit.  COPD (chronic obstructive pulmonary disease) Will try changing his Symbicort and Spiriva to Trelegy for at least a month to see if Oscar Robinson gets more benefit.  No clear  indication to repeat his pulmonary function testing at this time.  His most recent cardiopulmonary exercise test does point to his ventilatory limitation as the cause of his dyspnea and his inability to exercise  Obstructive sleep apnea It is been a long time since we address this.  Question whether his fatigue, lack of energy may relate to untreated OSA.  Could consider reevaluating depending on the work-up as we go forward.  Depression There is a lot of crossover between his shortness of breath, and malaise/lack of motivation.  Oscar Robinson  brings this up and wonders if depression is part of his overall syndrome.  This may be the case but it does appear that Oscar Robinson has true dyspnea with certain exercise.  Hard to blame this on depression.    Baltazar Apo, MD, PhD 01/30/2018, 4:54 PM [facility] [phone] or if no answer [phone]

## 2018-01-30 NOTE — Patient Instructions (Signed)
Please stop Spiriva and Symbicort for now. We will try starting Trelegy 1 inhalation once a day.  Remember to rinse and gargle after using this medication. Keep your albuterol available to use 2 puffs up to every 4 hours if needed for shortness of breath, chest tightness, wheezing. Walking oximetry on room air today. Follow with APP in about 1 month to discuss whether you have benefited on the Trelegy.  Follow with Dr [name] in 6 months or sooner if you have any problems

## 2018-01-30 NOTE — Assessment & Plan Note (Signed)
Will try changing his Symbicort and Spiriva to Trelegy for at least a month to see if he gets more benefit.  No clear indication to repeat his pulmonary function testing at this time.  His most recent cardiopulmonary exercise test does point to his ventilatory limitation as the cause of his dyspnea and his inability to exercise

## 2018-01-30 NOTE — Assessment & Plan Note (Signed)
Continued dyspnea on exertion.  He is quite active, is able to walk indefinitely, do chores around the farm, lead hunts, but he is also able to elicit dyspnea with all this activity.  He believes that his functional capacity is decreased and he notices this especially since March of this year.  His LV function is stable by echocardiogram, CBC, TSH, BMP and chest x-ray are all reassuring.  I do believe that his limitation is most likely due to his severe COPD.  I explained to him that at some point the severity of his obstruction is going to limit his activity.  We both hope that now is not the time.  I will check a walking oximetry with vigorous walking today to see if I can make him desaturate  If so then oxygen with heavy activity may be indicated.  I will also try changing his Spiriva and Symbicort over to Trelegy to see if he will benefit.

## 2018-01-31 ENCOUNTER — Ambulatory Visit: Payer: Medicare Other | Admitting: Emergency Medicine

## 2018-02-05 ENCOUNTER — Other Ambulatory Visit: Payer: Self-pay | Admitting: Cardiology

## 2018-02-10 ENCOUNTER — Encounter: Payer: Self-pay | Admitting: Physician Assistant

## 2018-02-10 DIAGNOSIS — E1165 Type 2 diabetes mellitus with hyperglycemia: Secondary | ICD-10-CM | POA: Diagnosis not present

## 2018-02-10 DIAGNOSIS — E02 Subclinical iodine-deficiency hypothyroidism: Secondary | ICD-10-CM | POA: Diagnosis not present

## 2018-02-10 DIAGNOSIS — E049 Nontoxic goiter, unspecified: Secondary | ICD-10-CM | POA: Diagnosis not present

## 2018-02-10 DIAGNOSIS — I10 Essential (primary) hypertension: Secondary | ICD-10-CM | POA: Diagnosis not present

## 2018-02-10 DIAGNOSIS — E7800 Pure hypercholesterolemia, unspecified: Secondary | ICD-10-CM | POA: Diagnosis not present

## 2018-02-10 DIAGNOSIS — Z9641 Presence of insulin pump (external) (internal): Secondary | ICD-10-CM | POA: Diagnosis not present

## 2018-02-14 ENCOUNTER — Encounter: Payer: Self-pay | Admitting: Cardiology

## 2018-02-17 ENCOUNTER — Other Ambulatory Visit: Payer: Self-pay | Admitting: Cardiology

## 2018-02-19 NOTE — Progress Notes (Signed)
Cardiology Office Note   Date:  02/21/2018   ID:  Oscar Robinson, DOB 02-Mar-1946, MRN [id number]  PCP:  [name], MD  Cardiologist:  Dr. Marlou Porch     Chief Complaint  Patient presents with  . Shortness of Breath    DOE anginal equivelent      History of Present Illness: Oscar Robinson is a 72 y.o. male who presents for CAD with hx CABG and follow up visit for on going DOE and decreasing energy.  He has a history of CAD s/p CABG 1999, longstanding tobacco abuse, ?CKD stage II, COPD, h/o dysphagia, iron deficiency, anxiety, arthritis, depression, DM, diverticulosis, GERD, hiatal hernia, HTN, HLD who presents for evaluation of SOB and fatigue. He is simultaneously followed by pulm for significant COPD. Last cath 06/2013 - native vessel LAD/diagonal bifurcation disease with patent LIMA to LAD/diagonal albeit small caliber vessel, normal perfusion pattern on nuclear stress, (Concern was ST segment depression as well as decrease in blood pressure during exercise, dyspnea), normal LVEDP and EF 60%. He underwent cardiopulmonary exercise test on 07/08/15 which showed fairly significant ventilatory obstruction. Last echo 12/2015 showed 72-09%, normal diastolic parameters, mitral valve systolic bowing w/o prolapse  On last visit he had DOE, more fatigue, and using nebs in night after walking to [place].   It was felt beginning IMdurand seeing how pt did. He did not believe he could walk on treadmill and lexiscan would increase wheezing.  He is back today for eval.    Echo with EF 55-65%,  PA pk pressure 23 mmHg  CXR was stable.  He was seen by pulmonary and his inhaler was changed.    Today he reports no change with DOE despite inhaler change.  Just walking in his house he is dyspneic.  Prior to CABG he had no chest pain only dyspnea.  He reports no chest pain.  We discussed various tests and he would prefer to proceed with cardiac cath.  His grafts are 71 years old and last cath 5 years ago.   This may be pulmonary but only way to know is cath.      Past Medical History:  Diagnosis Date  . Anemia   . Anxiety   . Arthritis   . CKD (chronic kidney disease), stage II   . COPD (chronic obstructive pulmonary disease) ([place])   . Coronary artery disease    a. s/p CABG 1999.  . Depression   . Diabetes mellitus   . Diverticulosis   . GERD (gastroesophageal reflux disease)   . Hiatal hernia   . Hypercholesteremia   . Hypertension   . Sleep apnea    had sleep study and negative per pt  . Thyroid disease   . Tobacco abuse   . Tubular adenoma of colon     Past Surgical History:  Procedure Laterality Date  . COLONOSCOPY    . CORONARY ARTERY BYPASS GRAFT  1999  . 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 HAND SURGERY     lt palm,ring finger,  . HAND SURGERY Left    pinky finger  . HERNIA REPAIR  1996   lt/rt ing hernia  . LEFT HEART CATHETERIZATION WITH CORONARY/GRAFT ANGIOGRAM N/A 06/15/2013   Procedure: LEFT HEART CATHETERIZATION WITH Beatrix Fetters;  Surgeon: Candee Furbish,  MD;  Location: [facility];  Service: Cardiovascular;  Laterality: N/A;  . UPPER GASTROINTESTINAL ENDOSCOPY       Current Outpatient Medications  Medication Sig Dispense Refill  . albuterol (PROAIR HFA) 108 (90 BASE) MCG/ACT inhaler Inhale 2 puffs into the lungs every 4 (four) hours as needed for wheezing or shortness of breath. 1 Inhaler 3  . albuterol (PROVENTIL) (2.5 MG/3ML) 0.083% nebulizer solution Take 3 mLs (2.5 mg total) by nebulization every 6 (six) hours. 360 mL 12  . ALPRAZolam (XANAX) 0.5 MG tablet Take 0.5 mg by mouth at bedtime.     Marland Kitchen amLODipine (NORVASC) 5 MG tablet TAKE 1 TABLET BY MOUTH ONCE DAILY 90 tablet 3  . aspirin 81 MG tablet Take 162 mg by mouth  daily.     . Coenzyme Q10 (CO Q 10) 100 MG CAPS Take 100 mg by mouth daily.    . colesevelam (WELCHOL) 625 MG tablet Take 3 tablets (1,875 mg total) by mouth 2 (two) times daily with a meal. 180 tablet 3  . doxepin (SINEQUAN) 50 MG capsule Take 100 mg by mouth daily.    Marland Kitchen esomeprazole (NEXIUM) 20 MG capsule Take 20 mg by mouth 2 (two) times daily.    . Fluticasone-Umeclidin-Vilant (TRELEGY ELLIPTA) 100-62.5-25 MCG/INH AEPB Inhale 1 puff into the lungs daily. 1 each 0  . Insulin Lispro (HUMALOG [place]) 20 Units. With meals/has a pump    . losartan (COZAAR) 50 MG tablet TAKE 1 TABLET BY MOUTH ONCE DAILY 90 tablet 3  . metFORMIN (GLUCOPHAGE) 500 MG tablet Take 500 mg by mouth 2 (two) times daily.     . metoprolol succinate (TOPROL-XL) 50 MG 24 hr tablet TAKE 1 TABLET BY MOUTH ONCE DAILY WITH  MEAL  OR  IMMEDIATELY  FOLLOWING  A  MEAL 90 tablet 3  . nitroGLYCERIN (NITROSTAT) 0.4 MG SL tablet Place 1 tablet (0.4 mg total) under the tongue every 5 (five) minutes as needed for chest pain. 30 tablet 5  . Omega-3 Fatty Acids (FISH OIL PO) Take 350 mg by mouth 2 (two) times daily.    . pravastatin (PRAVACHOL) 40 MG tablet Take 1 tablet (40 mg total) daily by mouth. 90 tablet 3  . SYMBICORT 160-4.5 MCG/ACT inhaler INHALE 2 PUFFS BY MOUTH EVERY 12 HOURS RINSE  MOUTH  AFTER  EVERY  USE 1 Inhaler 2  . Fe Fum-FePoly-Vit C-Vit B3 (INTEGRA) 62.5-62.5-40-3 MG CAPS TAKE ONE CAPSULE BY MOUTH ONCE DAILY (Patient not taking: Reported on 02/20/2018) 30 capsule 2   Current Facility-Administered Medications  Medication Dose Route Frequency Provider Last Rate Last Dose  . 0.9 %  sodium chloride infusion  500 mL Intravenous Continuous Milus Banister, MD        Allergies:   Patient has no known allergies.    Social History:  The patient  reports that he quit smoking about 20 years ago. His smoking use included cigarettes. He has a 70.00 pack-year smoking history. He has never used smokeless tobacco. He reports that he  drinks alcohol. He reports that he does not use drugs.   Family History:  The patient's family history includes Drug abuse in his brother.    ROS:  General:no colds or fevers, + weight increase Skin:no rashes or ulcers HEENT:no blurred vision, no congestion CV:see HPI PUL:see HPI GI:no diarrhea constipation or melena, no indigestion GU:no hematuria, no dysuria MS:no joint pain, no claudication Neuro:no syncope, no lightheadedness Endo:+ diabetes, no thyroid disease  Wt Readings from Last  3 Encounters:  01/30/18 183 lb 12.8 oz (83.4 kg)  01/28/18 184 lb (83.5 kg)  09/30/17 179 lb (81.2 kg)     PHYSICAL EXAM: VS:  BP 136/82   Pulse 80   Ht 5\' 8"  (1.727 m)   SpO2 95%   BMI 27.95 kg/m  , BMI Body mass index is 27.95 kg/m. General:Pleasant affect, NAD Skin:Warm and dry, brisk capillary refill HEENT:normocephalic, sclera clear, mucus membranes moist Neck:supple, no JVD, no bruits  Heart:S1S2 RRR without murmur, gallup, rub or click Lungs:clear without rales, rhonchi, or wheezes PYP:PJKD, non tender, + BS, do not palpate liver spleen or masses Ext:no lower ext edema, 2+ pedal pulses, 2+ radial pulses Neuro:alert and oriented, MAE, follows commands, + facial symmetry    EKG:  EKG is Not ordered today. The ekg  Was done 01/28/18    Recent Labs: 01/29/2018: Hemoglobin 13.2; NT-Pro BNP 73; Platelets 253; TSH 3.780 02/20/2018: BUN 12; Creatinine, Ser 1.20; Potassium 4.8; Sodium 139    Lipid Panel    Component Value Date/Time   CHOL 127 06/05/2016 1055   TRIG 89 06/05/2016 1055   HDL 58 06/05/2016 1055   CHOLHDL 2.2 06/05/2016 1055   VLDL 18 06/05/2016 1055   LDLCALC 51 06/05/2016 1055       Other studies Reviewed: Additional studies/ records that were reviewed today include: . Echo 01/28/18 Study Conclusions  - Left ventricle: The cavity size was normal. Wall thickness was   increased in a pattern of mild LVH. Systolic function was normal.   The estimated  ejection fraction was in the range of 55% to 60%.   Wall motion was normal; there were no regional wall motion   abnormalities. Left ventricular diastolic function parameters   were normal. - Aortic valve: Transvalvular velocity was within the normal range.   There was no stenosis. There was no regurgitation. - Mitral valve: Transvalvular velocity was within the normal range.   There was no evidence for stenosis. There was trivial   regurgitation. - Right ventricle: The cavity size was normal. Wall thickness was   normal. Systolic function was normal. - Atrial septum: No defect or patent foramen ovale was identified. - Tricuspid valve: There was trivial regurgitation. - Pulmonary arteries: Systolic pressure was within the normal   range. PA peak pressure: 23 mm Hg (S).   ASSESSMENT AND PLAN:  1.  DOE continues most likley anginal equivalent. and unable to take imdur due to severe headache.  After discussion about tests and discussed with Dr. Marlou Porch with plan to do cardiac cath to eval grafts.    The patient understands that risks included but are not limited to stroke (1 in 1000), death (1 in 54), kidney failure [usually temporary] (1 in 500), bleeding (1 in 200), allergic reaction [possibly serious] (1 in 200).   Pt is agreeable.  Will follow up 2 weeks post cath.  2.  CAD with CABG 1999, and cath in 2014 was stable.    3.  COPD severe, which may be source of DOE but changing inhalers has not helped.   4.  HTN controlled  5.  HLD on pravastatin.  Will see what cath shows may need stronger statin.   Current medicines are reviewed with the patient today.  The patient Has no concerns regarding medicines.  The following changes have been made:  See above Labs/ tests ordered today include:see above  Disposition:   FU:  see above  Signed, Cecilie Kicks, NP  02/21/2018 8:04 AM  [place], [place], [place] Phone: [phone]; Fax: [phone]

## 2018-02-19 NOTE — H&P (View-Only) (Signed)
Cardiology Office Note   Date:  02/21/2018   ID:  Oscar Robinson, DOB Apr 13, 1946, MRN [id number]  PCP:  [name], MD  Cardiologist:  Dr. Marlou Porch     Chief Complaint  Patient presents with  . Shortness of Breath    DOE anginal equivelent      History of Present Illness: Oscar Robinson is a 72 y.o. male who presents for CAD with hx CABG and follow up visit for on going DOE and decreasing energy.  He has a history of CAD s/p CABG 1999, longstanding tobacco abuse, ?CKD stage II, COPD, h/o dysphagia, iron deficiency, anxiety, arthritis, depression, DM, diverticulosis, GERD, hiatal hernia, HTN, HLD who presents for evaluation of SOB and fatigue. He is simultaneously followed by pulm for significant COPD. Last cath 06/2013 - native vessel LAD/diagonal bifurcation disease with patent LIMA to LAD/diagonal albeit small caliber vessel, normal perfusion pattern on nuclear stress, (Concern was ST segment depression as well as decrease in blood pressure during exercise, dyspnea), normal LVEDP and EF 60%. He underwent cardiopulmonary exercise test on 07/08/15 which showed fairly significant ventilatory obstruction. Last echo 12/2015 showed 62-13%, normal diastolic parameters, mitral valve systolic bowing w/o prolapse  On last visit he had DOE, more fatigue, and using nebs in night after walking to [place].   It was felt beginning IMdurand seeing how pt did. He did not believe he could walk on treadmill and lexiscan would increase wheezing.  He is back today for eval.    Echo with EF 55-65%,  PA pk pressure 23 mmHg  CXR was stable.  He was seen by pulmonary and his inhaler was changed.    Today he reports no change with DOE despite inhaler change.  Just walking in his house he is dyspneic.  Prior to CABG he had no chest pain only dyspnea.  He reports no chest pain.  We discussed various tests and he would prefer to proceed with cardiac cath.  His grafts are 71 years old and last cath 5 years ago.   This may be pulmonary but only way to know is cath.      Past Medical History:  Diagnosis Date  . Anemia   . Anxiety   . Arthritis   . CKD (chronic kidney disease), stage II   . COPD (chronic obstructive pulmonary disease) ([place])   . Coronary artery disease    a. s/p CABG 1999.  . Depression   . Diabetes mellitus   . Diverticulosis   . GERD (gastroesophageal reflux disease)   . Hiatal hernia   . Hypercholesteremia   . Hypertension   . Sleep apnea    had sleep study and negative per pt  . Thyroid disease   . Tobacco abuse   . Tubular adenoma of colon     Past Surgical History:  Procedure Laterality Date  . COLONOSCOPY    . CORONARY ARTERY BYPASS GRAFT  1999  . 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 HAND SURGERY     lt palm,ring finger,  . HAND SURGERY Left    pinky finger  . HERNIA REPAIR  1996   lt/rt ing hernia  . LEFT HEART CATHETERIZATION WITH CORONARY/GRAFT ANGIOGRAM N/A 06/15/2013   Procedure: LEFT HEART CATHETERIZATION WITH Beatrix Fetters;  Surgeon: Candee Furbish,  MD;  Location: [facility];  Service: Cardiovascular;  Laterality: N/A;  . UPPER GASTROINTESTINAL ENDOSCOPY       Current Outpatient Medications  Medication Sig Dispense Refill  . albuterol (PROAIR HFA) 108 (90 BASE) MCG/ACT inhaler Inhale 2 puffs into the lungs every 4 (four) hours as needed for wheezing or shortness of breath. 1 Inhaler 3  . albuterol (PROVENTIL) (2.5 MG/3ML) 0.083% nebulizer solution Take 3 mLs (2.5 mg total) by nebulization every 6 (six) hours. 360 mL 12  . ALPRAZolam (XANAX) 0.5 MG tablet Take 0.5 mg by mouth at bedtime.     Marland Kitchen amLODipine (NORVASC) 5 MG tablet TAKE 1 TABLET BY MOUTH ONCE DAILY 90 tablet 3  . aspirin 81 MG tablet Take 162 mg by mouth  daily.     . Coenzyme Q10 (CO Q 10) 100 MG CAPS Take 100 mg by mouth daily.    . colesevelam (WELCHOL) 625 MG tablet Take 3 tablets (1,875 mg total) by mouth 2 (two) times daily with a meal. 180 tablet 3  . doxepin (SINEQUAN) 50 MG capsule Take 100 mg by mouth daily.    Marland Kitchen esomeprazole (NEXIUM) 20 MG capsule Take 20 mg by mouth 2 (two) times daily.    . Fluticasone-Umeclidin-Vilant (TRELEGY ELLIPTA) 100-62.5-25 MCG/INH AEPB Inhale 1 puff into the lungs daily. 1 each 0  . Insulin Lispro (HUMALOG [place]) 20 Units. With meals/has a pump    . losartan (COZAAR) 50 MG tablet TAKE 1 TABLET BY MOUTH ONCE DAILY 90 tablet 3  . metFORMIN (GLUCOPHAGE) 500 MG tablet Take 500 mg by mouth 2 (two) times daily.     . metoprolol succinate (TOPROL-XL) 50 MG 24 hr tablet TAKE 1 TABLET BY MOUTH ONCE DAILY WITH  MEAL  OR  IMMEDIATELY  FOLLOWING  A  MEAL 90 tablet 3  . nitroGLYCERIN (NITROSTAT) 0.4 MG SL tablet Place 1 tablet (0.4 mg total) under the tongue every 5 (five) minutes as needed for chest pain. 30 tablet 5  . Omega-3 Fatty Acids (FISH OIL PO) Take 350 mg by mouth 2 (two) times daily.    . pravastatin (PRAVACHOL) 40 MG tablet Take 1 tablet (40 mg total) daily by mouth. 90 tablet 3  . SYMBICORT 160-4.5 MCG/ACT inhaler INHALE 2 PUFFS BY MOUTH EVERY 12 HOURS RINSE  MOUTH  AFTER  EVERY  USE 1 Inhaler 2  . Fe Fum-FePoly-Vit C-Vit B3 (INTEGRA) 62.5-62.5-40-3 MG CAPS TAKE ONE CAPSULE BY MOUTH ONCE DAILY (Patient not taking: Reported on 02/20/2018) 30 capsule 2   Current Facility-Administered Medications  Medication Dose Route Frequency Provider Last Rate Last Dose  . 0.9 %  sodium chloride infusion  500 mL Intravenous Continuous Milus Banister, MD        Allergies:   Patient has no known allergies.    Social History:  The patient  reports that he quit smoking about 20 years ago. His smoking use included cigarettes. He has a 70.00 pack-year smoking history. He has never used smokeless tobacco. He reports that he  drinks alcohol. He reports that he does not use drugs.   Family History:  The patient's family history includes Drug abuse in his brother.    ROS:  General:no colds or fevers, + weight increase Skin:no rashes or ulcers HEENT:no blurred vision, no congestion CV:see HPI PUL:see HPI GI:no diarrhea constipation or melena, no indigestion GU:no hematuria, no dysuria MS:no joint pain, no claudication Neuro:no syncope, no lightheadedness Endo:+ diabetes, no thyroid disease  Wt Readings from Last  3 Encounters:  01/30/18 183 lb 12.8 oz (83.4 kg)  01/28/18 184 lb (83.5 kg)  09/30/17 179 lb (81.2 kg)     PHYSICAL EXAM: VS:  BP 136/82   Pulse 80   Ht 5\' 8"  (1.727 m)   SpO2 95%   BMI 27.95 kg/m  , BMI Body mass index is 27.95 kg/m. General:Pleasant affect, NAD Skin:Warm and dry, brisk capillary refill HEENT:normocephalic, sclera clear, mucus membranes moist Neck:supple, no JVD, no bruits  Heart:S1S2 RRR without murmur, gallup, rub or click Lungs:clear without rales, rhonchi, or wheezes ION:GEXB, non tender, + BS, do not palpate liver spleen or masses Ext:no lower ext edema, 2+ pedal pulses, 2+ radial pulses Neuro:alert and oriented, MAE, follows commands, + facial symmetry    EKG:  EKG is Not ordered today. The ekg  Was done 01/28/18    Recent Labs: 01/29/2018: Hemoglobin 13.2; NT-Pro BNP 73; Platelets 253; TSH 3.780 02/20/2018: BUN 12; Creatinine, Ser 1.20; Potassium 4.8; Sodium 139    Lipid Panel    Component Value Date/Time   CHOL 127 06/05/2016 1055   TRIG 89 06/05/2016 1055   HDL 58 06/05/2016 1055   CHOLHDL 2.2 06/05/2016 1055   VLDL 18 06/05/2016 1055   LDLCALC 51 06/05/2016 1055       Other studies Reviewed: Additional studies/ records that were reviewed today include: . Echo 01/28/18 Study Conclusions  - Left ventricle: The cavity size was normal. Wall thickness was   increased in a pattern of mild LVH. Systolic function was normal.   The estimated  ejection fraction was in the range of 55% to 60%.   Wall motion was normal; there were no regional wall motion   abnormalities. Left ventricular diastolic function parameters   were normal. - Aortic valve: Transvalvular velocity was within the normal range.   There was no stenosis. There was no regurgitation. - Mitral valve: Transvalvular velocity was within the normal range.   There was no evidence for stenosis. There was trivial   regurgitation. - Right ventricle: The cavity size was normal. Wall thickness was   normal. Systolic function was normal. - Atrial septum: No defect or patent foramen ovale was identified. - Tricuspid valve: There was trivial regurgitation. - Pulmonary arteries: Systolic pressure was within the normal   range. PA peak pressure: 23 mm Hg (S).   ASSESSMENT AND PLAN:  1.  DOE continues most likley anginal equivalent. and unable to take imdur due to severe headache.  After discussion about tests and discussed with Dr. Marlou Porch with plan to do cardiac cath to eval grafts.    The patient understands that risks included but are not limited to stroke (1 in 1000), death (1 in 29), kidney failure [usually temporary] (1 in 500), bleeding (1 in 200), allergic reaction [possibly serious] (1 in 200).   Pt is agreeable.  Will follow up 2 weeks post cath.  2.  CAD with CABG 1999, and cath in 2014 was stable.    3.  COPD severe, which may be source of DOE but changing inhalers has not helped.   4.  HTN controlled  5.  HLD on pravastatin.  Will see what cath shows may need stronger statin.   Current medicines are reviewed with the patient today.  The patient Has no concerns regarding medicines.  The following changes have been made:  See above Labs/ tests ordered today include:see above  Disposition:   FU:  see above  Signed, Cecilie Kicks, NP  02/21/2018 8:04 AM  [place], [place], [place] Phone: [phone]; Fax: [phone]

## 2018-02-20 ENCOUNTER — Ambulatory Visit (INDEPENDENT_AMBULATORY_CARE_PROVIDER_SITE_OTHER): Payer: Medicare Other | Admitting: Cardiology

## 2018-02-20 ENCOUNTER — Encounter: Payer: Self-pay | Admitting: *Deleted

## 2018-02-20 ENCOUNTER — Encounter: Payer: Self-pay | Admitting: Cardiology

## 2018-02-20 VITALS — BP 136/82 | HR 80 | Ht 68.0 in | Wt 184.0 lb

## 2018-02-20 DIAGNOSIS — R0609 Other forms of dyspnea: Secondary | ICD-10-CM

## 2018-02-20 DIAGNOSIS — I2510 Atherosclerotic heart disease of native coronary artery without angina pectoris: Secondary | ICD-10-CM | POA: Diagnosis not present

## 2018-02-20 DIAGNOSIS — E785 Hyperlipidemia, unspecified: Secondary | ICD-10-CM

## 2018-02-20 DIAGNOSIS — I10 Essential (primary) hypertension: Secondary | ICD-10-CM | POA: Diagnosis not present

## 2018-02-20 DIAGNOSIS — I200 Unstable angina: Secondary | ICD-10-CM

## 2018-02-20 NOTE — Patient Instructions (Addendum)
Medication Instructions:   Your physician recommends that you continue on your current medications as directed. Please refer to the Current Medication list given to you today.   If you need a refill on your cardiac medications before your next appointment, please call your pharmacy.  Labwork: BMET  TODAY   Testing/Procedures: NONE ORDERED  TODAY'   Follow-Up: AFTER 02-26-18   2 WEEKS POST CATH FOLLOW UP WITH DR Marlou Porch OR  INGOLD  (MAY TAKE HOLD SPOTS )     Any Other Special Instructions Will Be Listed Below (If Applicable).

## 2018-02-21 ENCOUNTER — Encounter: Payer: Self-pay | Admitting: Cardiology

## 2018-02-21 LAB — BASIC METABOLIC PANEL
BUN/Creatinine Ratio: 10 (ref 10–24)
BUN: 12 mg/dL (ref 8–27)
CALCIUM: 9.7 mg/dL (ref 8.6–10.2)
CO2: 24 mmol/L (ref 20–29)
Chloride: 99 mmol/L (ref 96–106)
Creatinine, Ser: 1.2 mg/dL (ref 0.76–1.27)
GFR calc Af Amer: 70 mL/min/{1.73_m2} (ref >59)
GFR, EST NON AFRICAN AMERICAN: 60 mL/min/{1.73_m2} (ref >59)
Glucose: 138 mg/dL — ABNORMAL HIGH (ref 65–99)
POTASSIUM: 4.8 mmol/L (ref 3.5–5.2)
Sodium: 139 mmol/L (ref 134–144)

## 2018-02-25 ENCOUNTER — Telehealth: Payer: Self-pay | Admitting: *Deleted

## 2018-02-25 NOTE — Telephone Encounter (Signed)
Pt contacted pre-catheterization scheduled at [facility] for: Wednesday February 26, 2018 11:30 AM Verified arrival time and place: [facility] En[facility]ance A at: 9 AM  No solid food after midnight prior to cath, clear liquids until 5 AM day of procedure. Verified allergies in Epic  Hold: Metformin AM of procedure and 48 hours post procedure    Except hold medications AM meds can be  taken pre-cath with sip of water including: ASA 81 mg  Confirmed patient has responsible person to drive home post procedure and for 24 hours after you arrive home: yes

## 2018-02-26 ENCOUNTER — Ambulatory Visit (HOSPITAL_COMMUNITY)
Admission: RE | Admit: 2018-02-26 | Discharge: 2018-02-26 | Disposition: A | Discharge status: Home / Self Care | Payer: Medicare Other | Source: Ambulatory Visit | Attending: Interventional Cardiology | Admitting: Interventional Cardiology

## 2018-02-26 ENCOUNTER — Ambulatory Visit (HOSPITAL_COMMUNITY)
Admission: RE | Disposition: A | Discharge status: Home / Self Care | Payer: Self-pay | Source: Ambulatory Visit | Attending: Interventional Cardiology

## 2018-02-26 DIAGNOSIS — I129 Hypertensive chronic kidney disease with stage 1 through stage 4 chronic kidney disease, or unspecified chronic kidney disease: Secondary | ICD-10-CM | POA: Insufficient documentation

## 2018-02-26 DIAGNOSIS — R0602 Shortness of breath: Secondary | ICD-10-CM | POA: Insufficient documentation

## 2018-02-26 DIAGNOSIS — K219 Gastro-esophageal reflux disease without esophagitis: Secondary | ICD-10-CM | POA: Diagnosis not present

## 2018-02-26 DIAGNOSIS — Z794 Long term (current) use of insulin: Secondary | ICD-10-CM | POA: Diagnosis not present

## 2018-02-26 DIAGNOSIS — Z87891 Personal history of nicotine dependence: Secondary | ICD-10-CM | POA: Insufficient documentation

## 2018-02-26 DIAGNOSIS — Z79899 Other long term (current) drug therapy: Secondary | ICD-10-CM | POA: Diagnosis not present

## 2018-02-26 DIAGNOSIS — Z955 Presence of coronary angioplasty implant and graft: Secondary | ICD-10-CM | POA: Insufficient documentation

## 2018-02-26 DIAGNOSIS — F329 Major depressive disorder, single episode, unspecified: Secondary | ICD-10-CM | POA: Diagnosis not present

## 2018-02-26 DIAGNOSIS — G4730 Sleep apnea, unspecified: Secondary | ICD-10-CM | POA: Insufficient documentation

## 2018-02-26 DIAGNOSIS — E079 Disorder of thyroid, unspecified: Secondary | ICD-10-CM | POA: Insufficient documentation

## 2018-02-26 DIAGNOSIS — E1122 Type 2 diabetes mellitus with diabetic chronic kidney disease: Secondary | ICD-10-CM | POA: Insufficient documentation

## 2018-02-26 DIAGNOSIS — J449 Chronic obstructive pulmonary disease, unspecified: Secondary | ICD-10-CM | POA: Insufficient documentation

## 2018-02-26 DIAGNOSIS — E7800 Pure hypercholesterolemia, unspecified: Secondary | ICD-10-CM | POA: Diagnosis not present

## 2018-02-26 DIAGNOSIS — Z9889 Other specified postprocedural states: Secondary | ICD-10-CM | POA: Insufficient documentation

## 2018-02-26 DIAGNOSIS — I25118 Atherosclerotic heart disease of native coronary artery with other forms of angina pectoris: Secondary | ICD-10-CM | POA: Insufficient documentation

## 2018-02-26 DIAGNOSIS — Z7982 Long term (current) use of aspirin: Secondary | ICD-10-CM | POA: Diagnosis not present

## 2018-02-26 DIAGNOSIS — Z7951 Long term (current) use of inhaled steroids: Secondary | ICD-10-CM | POA: Insufficient documentation

## 2018-02-26 DIAGNOSIS — N182 Chronic kidney disease, stage 2 (mild): Secondary | ICD-10-CM | POA: Insufficient documentation

## 2018-02-26 DIAGNOSIS — Z951 Presence of aortocoronary bypass graft: Secondary | ICD-10-CM | POA: Diagnosis not present

## 2018-02-26 DIAGNOSIS — M1990 Unspecified osteoarthritis, unspecified site: Secondary | ICD-10-CM | POA: Diagnosis not present

## 2018-02-26 DIAGNOSIS — F419 Anxiety disorder, unspecified: Secondary | ICD-10-CM | POA: Insufficient documentation

## 2018-02-26 HISTORY — PX: LEFT HEART CATH AND CORS/GRAFTS ANGIOGRAPHY: CATH118250

## 2018-02-26 LAB — GLUCOSE, CAPILLARY
GLUCOSE-CAPILLARY: 207 mg/dL — AB (ref 70–99)
Glucose-Capillary: 96 mg/dL (ref 70–99)

## 2018-02-26 SURGERY — LEFT HEART CATH AND CORS/GRAFTS ANGIOGRAPHY
Anesthesia: LOCAL

## 2018-02-26 MED ORDER — SODIUM CHLORIDE 0.9 % WEIGHT BASED INFUSION
3.0000 mL/kg/h | INTRAVENOUS | Status: DC
  Administered 2018-02-26: 3 mL/kg/h via INTRAVENOUS

## 2018-02-26 MED ORDER — SODIUM CHLORIDE 0.9% FLUSH: 3.0000 mL | Freq: Two times a day (BID) | INTRAVENOUS | Status: DC

## 2018-02-26 MED ORDER — ASPIRIN 81 MG PO CHEW: 81.0000 mg | CHEWABLE_TABLET | ORAL | Status: DC

## 2018-02-26 MED ORDER — IOPAMIDOL (ISOVUE-370) INJECTION 76%
INTRAVENOUS | Status: AC
  Filled 2018-02-26: qty 125

## 2018-02-26 MED ORDER — LIDOCAINE HCL (PF) 1 % IJ SOLN
INTRAMUSCULAR | Status: DC | PRN
  Administered 2018-02-26: 17 mL via INTRADERMAL

## 2018-02-26 MED ORDER — SODIUM CHLORIDE 0.9 % IV SOLN: 250.0000 mL | INTRAVENOUS | Status: DC | PRN

## 2018-02-26 MED ORDER — ONDANSETRON HCL 4 MG/2ML IJ SOLN: 4.0000 mg | Freq: Four times a day (QID) | INTRAMUSCULAR | Status: DC | PRN

## 2018-02-26 MED ORDER — HEPARIN (PORCINE) IN NACL 1000-0.9 UT/500ML-% IV SOLN
INTRAVENOUS | Status: DC | PRN
  Administered 2018-02-26 (×2): 500 mL

## 2018-02-26 MED ORDER — LIDOCAINE HCL (PF) 1 % IJ SOLN
INTRAMUSCULAR | Status: AC
  Filled 2018-02-26: qty 30

## 2018-02-26 MED ORDER — MIDAZOLAM HCL 2 MG/2ML IJ SOLN
INTRAMUSCULAR | Status: DC | PRN
  Administered 2018-02-26: 2 mg via INTRAVENOUS

## 2018-02-26 MED ORDER — FENTANYL CITRATE (PF) 100 MCG/2ML IJ SOLN
INTRAMUSCULAR | Status: DC | PRN
  Administered 2018-02-26: 25 ug via INTRAVENOUS

## 2018-02-26 MED ORDER — ACETAMINOPHEN 325 MG PO TABS: 650.0000 mg | ORAL_TABLET | ORAL | Status: DC | PRN

## 2018-02-26 MED ORDER — FENTANYL CITRATE (PF) 100 MCG/2ML IJ SOLN
INTRAMUSCULAR | Status: AC
  Filled 2018-02-26: qty 2

## 2018-02-26 MED ORDER — HEPARIN (PORCINE) IN NACL 1000-0.9 UT/500ML-% IV SOLN
INTRAVENOUS | Status: AC
  Filled 2018-02-26: qty 1000

## 2018-02-26 MED ORDER — METFORMIN HCL 500 MG PO TABS: 500.0000 mg | ORAL_TABLET | Freq: Two times a day (BID) | ORAL | Status: AC

## 2018-02-26 MED ORDER — IOPAMIDOL (ISOVUE-370) INJECTION 76%
INTRAVENOUS | Status: DC | PRN
  Administered 2018-02-26: 65 mL via INTRAVENOUS

## 2018-02-26 MED ORDER — SODIUM CHLORIDE 0.9 % WEIGHT BASED INFUSION: 1.0000 mL/kg/h | INTRAVENOUS | Status: DC

## 2018-02-26 MED ORDER — SODIUM CHLORIDE 0.9 % IV SOLN: INTRAVENOUS | Status: AC

## 2018-02-26 MED ORDER — SODIUM CHLORIDE 0.9% FLUSH: 3.0000 mL | INTRAVENOUS | Status: DC | PRN

## 2018-02-26 MED ORDER — MIDAZOLAM HCL 2 MG/2ML IJ SOLN
INTRAMUSCULAR | Status: AC
  Filled 2018-02-26: qty 2

## 2018-02-26 MED ORDER — HEPARIN SODIUM (PORCINE) 1000 UNIT/ML IJ SOLN
INTRAMUSCULAR | Status: AC
  Filled 2018-02-26: qty 1

## 2018-02-26 SURGICAL SUPPLY — 8 items
CATH INFINITI 5FR MULTPACK ANG (CATHETERS) ×1 IMPLANT
KIT HEART LEFT (KITS) ×2 IMPLANT
PACK CARDIAC CATHETERIZATION (CUSTOM PROCEDURE TRAY) ×2 IMPLANT
SHEATH PINNACLE 5F 10CM (SHEATH) ×1 IMPLANT
SHEATH PROBE COVER 6X72 (BAG) ×1 IMPLANT
TRANSDUCER W/STOPCOCK (MISCELLANEOUS) ×2 IMPLANT
TUBING CIL FLEX 10 FLL-RA (TUBING) ×2 IMPLANT
WIRE EMERALD 3MM-J .035X150CM (WIRE) ×1 IMPLANT

## 2018-02-26 NOTE — Discharge Instructions (Signed)
**Note Corda Shutt-identified via Obfuscation** Femoral Site Care °Refer to this sheet in the next few weeks. These instructions provide you with information about caring for yourself after your procedure. Your health care provider may also give you more specific instructions. Your treatment has been planned according to current medical practices, but problems sometimes occur. Call your health care provider if you have any problems or questions after your procedure. °What can I expect after the procedure? °After your procedure, it is typical to have the following: °· Bruising at the site that usually fades within 1-2 weeks. °· Blood collecting in the tissue (hematoma) that may be painful to the touch. It should usually decrease in size and tenderness within 1-2 weeks. ° °Follow these instructions at home: °· Take medicines only as directed by your health care provider. °· You may shower 24-48 hours after the procedure or as directed by your health care provider. Remove the bandage (dressing) and gently wash the site with plain soap and water. Pat the area dry with a clean towel. Do not rub the site, because this may cause bleeding. °· Do not take baths, swim, or use a hot tub until your health care provider approves. °· Check your insertion site every day for redness, swelling, or drainage. °· Do not apply powder or lotion to the site. °· Limit use of stairs to twice a day for the first 2-3 days or as directed by your health care provider. °· Do not squat for the first 2-3 days or as directed by your health care provider. °· Do not lift over 10 lb (4.5 kg) for 5 days after your procedure or as directed by your health care provider. °· Ask your health care provider when it is okay to: °? Return to work or school. °? Resume usual physical activities or sports. °? Resume sexual activity. °· Do not drive home if you are discharged the same day as the procedure. Have someone else drive you. °· You may drive 24 hours after the procedure unless otherwise instructed by  your health care provider. °· Do not operate machinery or power tools for 24 hours after the procedure or as directed by your health care provider. °· If your procedure was done as an outpatient procedure, which means that you went home the same day as your procedure, a responsible adult should be with you for the first 24 hours after you arrive home. °· Keep all follow-up visits as directed by your health care provider. This is important. °Contact a health care provider if: °· You have a fever. °· You have chills. °· You have increased bleeding from the site. Hold pressure on the site. °Get help right away if: °· You have unusual pain at the site. °· You have redness, warmth, or swelling at the site. °· You have drainage (other than a small amount of blood on the dressing) from the site. °· The site is bleeding, and the bleeding does not stop after 30 minutes of holding steady pressure on the site. °· Your leg or foot becomes pale, cool, tingly, or numb. °This information is not intended to replace advice given to you by your health care provider. Make sure you discuss any questions you have with your health care provider. °Document Released: 03/26/2014 Document Revised: 12/29/2015 Document Reviewed: 02/09/2014 °Elsevier Interactive Patient Education © 2018 [employer] ° °

## 2018-02-26 NOTE — Progress Notes (Signed)
Site area: rt groin fa sheath pulled and pressure held by Early Chars Site Prior to Removal:  Level 0 Pressure Applied For: 20 minutes Manual:   yes Patient Status During Pull:  stable Post Pull Site:  Level 0 Post Pull Instructions Given:  yes Post Pull Pulses Present: palpable rt dp Dressing Applied:  Gauze and tegaderm Bedrest begins @ 1515 Comments:

## 2018-02-26 NOTE — Interval H&P Note (Signed)
Cath Lab Visit (complete for each Cath Lab visit)  Clinical Evaluation Leading to the Procedure:   ACS: No.  Non-ACS:    Anginal Classification: CCS III  Anti-ischemic medical therapy: Minimal Therapy (1 class of medications)  Non-Invasive Test Results: No non-invasive testing performed  Prior CABG: Previous CABG      History and Physical Interval Note:  02/26/2018 1:33 PM  Oscar Robinson  has presented today for surgery, with the diagnosis of unstable angina  The various methods of treatment have been discussed with the patient and family. After consideration of risks, benefits and other options for treatment, the patient has consented to  Procedure(s): LEFT HEART CATH AND CORS/GRAFTS ANGIOGRAPHY (N/A) as a surgical intervention .  The patient's history has been reviewed, patient examined, no change in status, stable for surgery.  I have reviewed the patient's chart and labs.  Questions were answered to the patient's satisfaction.     Larae Grooms

## 2018-02-27 ENCOUNTER — Encounter (HOSPITAL_COMMUNITY): Payer: Self-pay | Admitting: Interventional Cardiology

## 2018-02-27 MED FILL — Heparin Sodium (Porcine) Inj 1000 Unit/ML: INTRAMUSCULAR | Qty: 10 | Status: AC

## 2018-03-03 ENCOUNTER — Other Ambulatory Visit: Payer: Self-pay | Admitting: Cardiology

## 2018-03-03 ENCOUNTER — Encounter: Payer: Self-pay | Admitting: Pulmonary Disease

## 2018-03-03 ENCOUNTER — Ambulatory Visit (INDEPENDENT_AMBULATORY_CARE_PROVIDER_SITE_OTHER): Payer: Medicare Other | Admitting: Pulmonary Disease

## 2018-03-03 VITALS — BP 122/78 | HR 80 | Ht 69.0 in | Wt 188.0 lb

## 2018-03-03 DIAGNOSIS — Z87891 Personal history of nicotine dependence: Secondary | ICD-10-CM | POA: Diagnosis not present

## 2018-03-03 DIAGNOSIS — R0602 Shortness of breath: Secondary | ICD-10-CM

## 2018-03-03 DIAGNOSIS — J449 Chronic obstructive pulmonary disease, unspecified: Secondary | ICD-10-CM

## 2018-03-03 DIAGNOSIS — G4733 Obstructive sleep apnea (adult) (pediatric): Secondary | ICD-10-CM | POA: Diagnosis not present

## 2018-03-03 MED ORDER — FLUTICASONE-UMECLIDIN-VILANT 100-62.5-25 MCG/INH IN AEPB: 1.0000 | INHALATION_SPRAY | Freq: Every day | RESPIRATORY_TRACT | 5 refills | Status: DC

## 2018-03-03 MED ORDER — ALBUTEROL SULFATE HFA 108 (90 BASE) MCG/ACT IN AERS: 2.0000 | INHALATION_SPRAY | RESPIRATORY_TRACT | 3 refills | Status: DC | PRN

## 2018-03-03 NOTE — Assessment & Plan Note (Signed)
Continue not smoking 

## 2018-03-03 NOTE — Assessment & Plan Note (Signed)
Spirometry today in office >> Severe airway obstruction, no change from 2015 PFT  Epworth today >>3 >>will order home sleep study   Trelegy Ellipta inhaler sample provided today  Use your albuterol nebulizers twice a day for the next 3 days to help with wheezing  We will hold off giving more prednisone or antibiotics at this time   Note your daily symptoms > remember "red flags" for COPD:   >>>Increase in cough >>>increase in sputum production >>>increase in shortness of breath or activity  intolerance.   If you notice these symptoms, please call the office to be seen.   Follow-up with our office in 4 to 6 weeks with CAT score to see how you are doing with your trelegy Ellipta

## 2018-03-03 NOTE — Assessment & Plan Note (Signed)
Will order home sleep study today

## 2018-03-03 NOTE — Patient Instructions (Addendum)
Spirometry today in office >> Severe airway obstruction, no change from 2015 PFT  Epworth today >>3 >>will order home sleep study   Trelegy Ellipta inhaler sample provided today  Use your albuterol nebulizers twice a day for the next 3 days to help with wheezing  We will hold off giving more prednisone or antibiotics at this time   Note your daily symptoms > remember "red flags" for COPD:   >>>Increase in cough >>>increase in sputum production >>>increase in shortness of breath or activity  intolerance.   If you notice these symptoms, please call the office to be seen.     Follow-up with our office in 4 to 6 weeks with CAT score to see how you are doing with your trelegy Ellipta   Please contact the office if your symptoms worsen or you have concerns that you are not improving.   Thank you for choosing [facility] for your healthcare, and for allowing [place] to partner with you on your healthcare journey. I am thankful to be able to provide care to you today.   Wyn Quaker FNP-C

## 2018-03-03 NOTE — Progress Notes (Signed)
@Patient  ID: Oscar Robinson, male    DOB: [birth year], 72 y.o.   MRN: [id number]  Chief Complaint  Patient presents with  . Follow-up    Feels trelegy did good it is about the same as other inhaler he has been on.    Referring provider: [name], MD  HPI: 72 year old male patient followed for severe COPD.  Gold COPD 3B, the patient also with obstructive sleep apnea not on CPAP. Patient of [name]  Significant tobacco use history. 70 pack years.    Recent [facility] Encounters:   Acute OV 01/30/18 --this is an acute office visit for 72 year old man who I follow for severe COPD.  He also has untreated obstructive sleep apnea, CAD/CABG (1999), iron deficiency, hypertension, allergic rhinitis and GERD. He reports that he has had more exertional dyspnea, had a very active March and around that time he noted some progressive exertional SOB. He feels Robinson of energy - wonders to himself if maybe he may have some depression. He notices dyspnea with raking, with walking an incline. He is on spiriva and symbicort. He can't remember when his last flare was. He coughs very rarely.  Plan: Walk test, trial of trelegy, may need further treatment of OSA,   Tests:  01/28/2018-chest x-ray-no active pulmonary disease, mild pulmonary hyperinflation 03/03/2018-spirometry- ratio 59, FEV1 37, severe obstruction  Last cardiopulmonary exercise test was 07/08/2015, Showed that most of his exercise limitation was due to to ventilatory defect  Echocardiogram from 01/28/2018 was reviewed today and shows intact left ventricular function with a normal RV size and function, estimated PASP 23 mmHg.   CBC, TSH and BMP normal on 01/30/18 CXR 6/25 >> hyperinflated, no infiltrates.     03/03/18 OV 72 year old patient seen office visit today for follow-up of COPD.  Patient on trilogy Ellipta.  Patient was given a sample after last appointment patient reports they did not notice a significant change.   Patient ran out of sample and resume taking Symbicort and Spiriva.  Patient is requesting additional samples for trelogy Ellipta today.  Patient CAT score today is 17.  mMRC is 2.  Patient does feel that his dyspnea has gotten worse since last being seen in our office visit.  Patient also thinks that he is having more fatigue and his wife continues to remind him that he snores at night.  Patient is currently not being treated for obstructive sleep apnea but is interested in completing the test to rule that out.  Epworth's today in office is 3  After completing upwards patient says that he does feel like he does have some daytime fatigue and said he feels that this may not have been an accurate score.   No Known Allergies  Immunization History  Administered Date(s) Administered  . H1N1 07/09/2008  . Influenza Split 05/06/2012, 06/21/2015  . Influenza Whole 05/23/2010, 05/07/2011  . Influenza, High Dose Seasonal PF 05/23/2016  . Influenza,inj,Quad PF,6+ Mos 05/01/2013, 05/07/2014  . Influenza-Unspecified 04/01/2017  . Pneumococcal Conjugate-13 03/09/2014  . Pneumococcal Polysaccharide-23 05/23/2010  . Zoster 12/05/2014    Past Medical History:  Diagnosis Date  . Anemia   . Anxiety   . Arthritis   . CKD (chronic kidney disease), stage II   . COPD (chronic obstructive pulmonary disease) ([place])   . Coronary artery disease    a. s/p CABG 1999.  . Depression   . Diabetes mellitus   . Diverticulosis   . GERD (gastroesophageal reflux disease)   . Hiatal  hernia   . Hypercholesteremia   . Hypertension   . Sleep apnea    had sleep study and negative per pt  . Thyroid disease   . Tobacco abuse   . Tubular adenoma of colon     Tobacco History: Social History   Tobacco Use  Smoking Status Former Smoker  . Packs/day: 2.00  . Years: 35.00  . Pack years: 70.00  . Types: Cigarettes  . Last attempt to quit: 09/27/1997  . Years since quitting: 20.4  Smokeless Tobacco Never Used     Counseling given: Not Answered Continue not smoking.  Outpatient Encounter Medications as of 03/03/2018  Medication Sig  . albuterol (PROAIR HFA) 108 (90 Base) MCG/ACT inhaler Inhale 2 puffs into the lungs every 4 (four) hours as needed for wheezing or shortness of breath.  Marland Kitchen albuterol (PROVENTIL) (2.5 MG/3ML) 0.083% nebulizer solution Take 3 mLs (2.5 mg total) by nebulization every 6 (six) hours.  . ALPRAZolam (XANAX) 0.5 MG tablet Take 0.5 mg by mouth at bedtime.   Marland Kitchen amLODipine (NORVASC) 5 MG tablet TAKE 1 TABLET BY MOUTH ONCE DAILY  . aspirin 81 MG tablet Take 162 mg by mouth at bedtime.   . Coenzyme Q10 (CO Q 10) 100 MG CAPS Take 100 mg by mouth at bedtime.   . colesevelam (WELCHOL) 625 MG tablet TAKE 3 TABLETS BY MOUTH TWICE DAILY WITH MEALS  . doxepin (SINEQUAN) 50 MG capsule Take 100 mg by mouth at bedtime.   Marland Kitchen esomeprazole (NEXIUM) 20 MG capsule Take 20 mg by mouth 2 (two) times daily.  . Fe Fum-FePoly-Vit C-Vit B3 (INTEGRA) 62.5-62.5-40-3 MG CAPS TAKE ONE CAPSULE BY MOUTH ONCE DAILY  . Fluticasone-Umeclidin-Vilant (TRELEGY ELLIPTA) 100-62.5-25 MCG/INH AEPB Inhale 1 puff into the lungs daily.  Marland Kitchen HUMALOG 100 UNIT/ML injection USE VIA PUMP DAILY. MAX DAILY DOSE 150 UNITS PER DAY.  Marland Kitchen losartan (COZAAR) 50 MG tablet TAKE 1 TABLET BY MOUTH ONCE DAILY (Patient taking differently: TAKE 1 TABLET BY MOUTH AT BEDTIME)  . metFORMIN (GLUCOPHAGE) 500 MG tablet Take 1 tablet (500 mg total) by mouth 2 (two) times daily with a meal.  . metoprolol succinate (TOPROL-XL) 50 MG 24 hr tablet TAKE 1 TABLET BY MOUTH ONCE DAILY WITH  MEAL  OR  IMMEDIATELY  FOLLOWING  A  MEAL  . Multiple Vitamins-Minerals (CENTRUM SILVER 50+MEN PO) Take 1 tablet by mouth daily.  . nitroGLYCERIN (NITROSTAT) 0.4 MG SL tablet Place 1 tablet (0.4 mg total) under the tongue every 5 (five) minutes as needed for chest pain.  . Omega-3 Fatty Acids (FISH OIL) 600 MG CAPS Take 600 mg by mouth 2 (two) times daily.   . pravastatin  (PRAVACHOL) 40 MG tablet Take 1 tablet (40 mg total) daily by mouth. (Patient taking differently: Take 40 mg by mouth at bedtime. )  . SYMBICORT 160-4.5 MCG/ACT inhaler INHALE 2 PUFFS BY MOUTH EVERY 12 HOURS RINSE  MOUTH  AFTER  EVERY  USE  . [DISCONTINUED] albuterol (PROAIR HFA) 108 (90 BASE) MCG/ACT inhaler Inhale 2 puffs into the lungs every 4 (four) hours as needed for wheezing or shortness of breath.  . Fluticasone-Umeclidin-Vilant (TRELEGY ELLIPTA) 100-62.5-25 MCG/INH AEPB Inhale 1 puff into the lungs daily.  . Fluticasone-Umeclidin-Vilant (TRELEGY ELLIPTA) 100-62.5-25 MCG/INH AEPB Inhale 1 puff into the lungs daily.  . [DISCONTINUED] colesevelam (WELCHOL) 625 MG tablet Take 3 tablets (1,875 mg total) by mouth 2 (two) times daily with a meal.   Facility-Administered Encounter Medications as of 03/03/2018  Medication  .  0.9 %  sodium chloride infusion     Review of Systems     Constitutional: +fatigue, snoring, occasional sleepiness  No  weight loss, night sweats,  fevers, chills HEENT:   No headaches,  Difficulty swallowing,  Tooth/dental problems, or  Sore throat, No sneezing, itching, ear ache, nasal congestion, post nasal drip  CV: No chest pain,  orthopnea, PND, swelling in lower extremities, anasarca, dizziness, palpitations, syncope  GI: No heartburn, indigestion, abdominal pain, nausea, vomiting, diarrhea, change in bowel habits, loss of appetite, bloody stools Resp: +dyspnea on exertion, occasional cough  No shortness of breath at rest.  No excess mucus, No coughing up of blood.  No change in color of mucus.  No wheezing.  No chest wall deformity Skin: no rash, lesions, no skin changes. GU: no dysuria, change in color of urine, no urgency or frequency.  No flank pain, no hematuria  MS:  No joint pain or swelling.  No decreased range of motion.  No back pain. Psych:  No change in mood or affect. No depression or anxiety.  No memory loss.      Physical Exam  BP 122/78 (BP  Location: Left Arm, Cuff Size: Normal)   Pulse 80   Ht 5\' 9"  (1.753 m)   Wt 188 lb (85.3 kg)   SpO2 96%   BMI 27.76 kg/m   Wt Readings from Last 5 Encounters:  03/03/18 188 lb (85.3 kg)  02/26/18 185 lb (83.9 kg)  02/20/18 184 lb (83.5 kg)  01/30/18 183 lb 12.8 oz (83.4 kg)  01/28/18 184 lb (83.5 kg)     Physical Exam  Constitutional: He is oriented to person, place, and time and well-developed, well-nourished, and in no distress. No distress.  HENT:  Head: Normocephalic and atraumatic.  Right Ear: Hearing, tympanic membrane, external ear and ear canal normal.  Left Ear: Hearing, tympanic membrane, external ear and ear canal normal.  Mouth/Throat: Uvula is midline and oropharynx is clear and moist. No oropharyngeal exudate.  Eyes: Pupils are equal, round, and reactive to light.  Neck: Normal range of motion. Neck supple. No JVD present.  Cardiovascular: Normal rate, regular rhythm and normal heart sounds.  Pulmonary/Chest: Effort normal. No accessory muscle usage. No respiratory distress. He has no decreased breath sounds. He has wheezes (slight exp wheeze). He has no rhonchi.  Abdominal: Soft. Bowel sounds are normal. There is no tenderness.  Musculoskeletal: Normal range of motion. He exhibits no edema.  Lymphadenopathy:    He has no cervical adenopathy.  Neurological: He is alert and oriented to person, place, and time. Gait normal.  Skin: Skin is warm and dry. He is not diaphoretic. No erythema.  Psychiatric: Mood, memory, affect and judgment normal.  Nursing note and vitals reviewed.     Lab Results:  CBC    Component Value Date/Time   WBC 5.7 01/29/2018 1436   WBC 7.1 01/21/2018 1301   RBC 4.08 (L) 01/29/2018 1436   RBC 4.41 01/21/2018 1301   HGB 13.2 01/29/2018 1436   HCT 38.4 01/29/2018 1436   PLT 253 01/29/2018 1436   MCV 94 01/29/2018 1436   MCH 32.4 01/29/2018 1436   MCH 21.3 (L) 06/05/2016 1055   MCHC 34.4 01/29/2018 1436   MCHC 33.1 01/21/2018 1301     RDW 14.2 01/29/2018 1436   LYMPHSABS 1.9 01/21/2018 1301   MONOABS 0.8 01/21/2018 1301   EOSABS 0.2 01/21/2018 1301   BASOSABS 0.1 01/21/2018 1301    BMET  Component Value Date/Time   NA 139 02/20/2018 1247   K 4.8 02/20/2018 1247   CL 99 02/20/2018 1247   CO2 24 02/20/2018 1247   GLUCOSE 138 (H) 02/20/2018 1247   GLUCOSE 140 (H) 06/05/2016 1055   BUN 12 02/20/2018 1247   CREATININE 1.20 02/20/2018 1247   CREATININE 1.26 (H) 06/05/2016 1055   CALCIUM 9.7 02/20/2018 1247   GFRNONAA 60 02/20/2018 1247   GFRAA 70 02/20/2018 1247    BNP No results found for: BNP  ProBNP    Component Value Date/Time   PROBNP 73 01/29/2018 1436    Imaging: No results found.   Assessment & Plan:   Pleasant 72 year old patient seen for follow-up today.  Spirometry today shows consistent severe COPD.  Epworth today is low at 3.  But with patient discussion I think it reasonable to proceed forward with home sleep study.  Patient's TSH was normal at last visit.   Provided more trilogy Ellipta samples today.  Patient to continue using that.  We will have patient using albuterol nebulizers twice a day for the next 3 days to help with slight expiratory wheeze.  We will hold off on additional prednisone antibiotics at this time.  We will have patient follow-up with our office in 4 to 6 weeks with an additional CAT score to compare to today's.  Obstructive sleep apnea Will order home sleep study today  GOLD COPD 3 B Spirometry today in office >> Severe airway obstruction, no change from 2015 PFT  Epworth today >>3 >>will order home sleep study   Trelegy Ellipta inhaler sample provided today  Use your albuterol nebulizers twice a day for the next 3 days to help with wheezing  We will hold off giving more prednisone or antibiotics at this time   Note your daily symptoms > remember "red flags" for COPD:   >>>Increase in cough >>>increase in sputum production >>>increase in shortness  of breath or activity  intolerance.   If you notice these symptoms, please call the office to be seen.   Follow-up with our office in 4 to 6 weeks with CAT score to see how you are doing with your trelegy Ellipta  TOBACCO ABUSE, HX OF Continue not smoking   This appointment was 43 minutes along with her 50% of that time in direct face-to-face patient care, assessment, plan of care discussion, follow-up.   Lauraine Rinne, NP 03/03/2018

## 2018-03-03 NOTE — Progress Notes (Signed)
Discussed results with patient in office.  Nothing further is needed at this time.  Rober Skeels FNP  

## 2018-03-07 ENCOUNTER — Encounter: Payer: Self-pay | Admitting: Cardiology

## 2018-03-07 ENCOUNTER — Ambulatory Visit (INDEPENDENT_AMBULATORY_CARE_PROVIDER_SITE_OTHER): Payer: Medicare Other | Admitting: Cardiology

## 2018-03-07 VITALS — BP 146/82 | HR 76 | Ht 70.0 in | Wt 189.6 lb

## 2018-03-07 DIAGNOSIS — E785 Hyperlipidemia, unspecified: Secondary | ICD-10-CM

## 2018-03-07 DIAGNOSIS — I200 Unstable angina: Secondary | ICD-10-CM | POA: Diagnosis not present

## 2018-03-07 DIAGNOSIS — E7800 Pure hypercholesterolemia, unspecified: Secondary | ICD-10-CM

## 2018-03-07 DIAGNOSIS — I2510 Atherosclerotic heart disease of native coronary artery without angina pectoris: Secondary | ICD-10-CM

## 2018-03-07 DIAGNOSIS — I209 Angina pectoris, unspecified: Secondary | ICD-10-CM

## 2018-03-07 DIAGNOSIS — I10 Essential (primary) hypertension: Secondary | ICD-10-CM | POA: Diagnosis not present

## 2018-03-07 MED ORDER — FUROSEMIDE 20 MG PO TABS: 20.0000 mg | ORAL_TABLET | Freq: Every day | ORAL | 3 refills | Status: DC | PRN

## 2018-03-07 MED ORDER — NITROGLYCERIN 0.4 MG SL SUBL: 0.4000 mg | SUBLINGUAL_TABLET | SUBLINGUAL | 3 refills | Status: DC | PRN

## 2018-03-07 NOTE — Patient Instructions (Signed)
Medication Instructions:  You may take Furosemide 20 mg as needed for swelling.  Follow-Up: Follow up in 6 months with Cecilie Kicks, PA.  You will receive a letter in the mail 2 months before you are due.  Please call us when you receive this letter to schedule your follow up appointment.  Follow up in 1 year with Dr. Marlou Porch.  You will receive a letter in the mail 2 months before you are due.  Please call us when you receive this letter to schedule your follow up appointment.  If you need a refill on your cardiac medications before your next appointment, please call your pharmacy.  Thank you for choosing [facility]!!

## 2018-03-07 NOTE — Progress Notes (Addendum)
[facility] [address], [address], [place]  [address] Phone: [phone] Fax:  [phone]  Date:  03/07/2018   ID:  Oscar Robinson, DOB Jan 08, 1946, MRN [id number]  PCP:  [name], MD   History of Present Illness: Oscar Robinson is a 72 y.o. male with coronary artery disease status post bypass surgery 1999, significant tobacco use, COPD with recent increasing shortness of breath, dyspnea on exertion here at the request of [name] for further evaluation.  Dyspnea with exertional with no chest pain, currently moderate in severity, relieved with rest with no obvious exacerbating factors, non-positional. No palpitations. Prior to CABG had symptoms of dyspnea mostly. Fatigue.   Last stress test was about 2 years ago he thinks in [place] at [facility]. [name] it was OK.   06/11/13 he underwent nuclear stress test where he had marked ST segment depression diffusely during exercise, relieved with rest as well as marked shortness of breath, pale. Blood pressure also decreased during exercise.  07/10/13 -native vessel LAD/diagonal bifurcation disease with patent LIMA to LAD/diagonal albeit small caliber vessel. Normal perfusion pattern on nuclear stress test. My concern previously was ST segment depression as well as decrease in blood pressure during exercise. This is described above. Ejection fraction 60%. We decided to continue with medical management. Metoprolol, amlodipine, pravastatin. No PCI.  11/18/13-he has been having some complaints of dysphagia, food getting stuck in his upper throat. One of his friends died of esophageal cancer. He is a former smoker. He still having shortness of breath. Getting treated for COPD by [name]. Prior cardiac catheterization reviewed with him.   11/17/14- still having dyspnea on exertion. No significant changes however over the past year. He enjoys breeding dogs. He is quite active. In review of [name] note, consideration to  pulmonary rehabilitation in future. Prior cardiac catheterization reviewed once again.   05/16/15- recent episode of severe shortness of breath. Woke up, thought he was going to die. Currently on prednisone taper. Having wild dreams. Lower extremity edema noted. Weight is slightly increased. He is worried about taking doxepin long-term. Would like to talk to a psychiatrist regarding this. He also is worried about undergoing cardiopulmonary exercise test as he "went out" with previous study. Glucose was 26 after he just started his insulin pump.  12/05/15-overall feeling okay. Shortness of breath chronic from COPD. Has been seeing [name], [name]. Underwent cardiopulmonary exercise test on 07/08/15 which showed fairly significant ventilatory obstruction. Chest x-ray on 07/21/15 shows COPD without any evidence of pneumonia. No chest pain. Does well.  11/27/16 - feels tired. Been dx with iron def. Has diarrhea. Denies any anginal symptoms. Has overall been doing fairly well. [name] saw him last and diagnosed iron deficiency anemia. He has been seen in gastroenterology.  03/07/18 - mild ankle edema, would like lasix.  Has several scratches on his legs from puppies that he was recently working with.  He has not been having any chest pain.  Baseline shortness of breath.  He did not like taking isosorbide, caused headache.  He is stopped.  I am fine with this.  No syncope bleeding orthopnea PND.  Wt Readings from Last 3 Encounters:  03/07/18 189 lb 9.6 oz (86 kg)  03/03/18 188 lb (85.3 kg)  02/26/18 185 lb (83.9 kg)     Past Medical History:  Diagnosis Date  . Anemia   . Anxiety   . Arthritis   . CKD (chronic  kidney disease), stage II   . COPD (chronic obstructive pulmonary disease) ([place])   . Coronary artery disease    a. s/p CABG 1999.  . Depression   . Diabetes mellitus   . Diverticulosis   . GERD (gastroesophageal reflux disease)   . Hiatal hernia   . Hypercholesteremia   . Hypertension    . Sleep apnea    had sleep study and negative per pt  . Thyroid disease   . Tobacco abuse   . Tubular adenoma of colon     Past Surgical History:  Procedure Laterality Date  . COLONOSCOPY    . CORONARY ARTERY BYPASS GRAFT  1999  . 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 HAND SURGERY     lt palm,ring finger,  . HAND SURGERY Left    pinky finger  . HERNIA REPAIR  1996   lt/rt ing hernia  . LEFT HEART CATH AND CORS/GRAFTS ANGIOGRAPHY N/A 02/26/2018   Procedure: LEFT HEART CATH AND CORS/GRAFTS ANGIOGRAPHY;  Surgeon: Jettie Booze, MD;  Location: [facility];  Service: Cardiovascular;  Laterality: N/A;  . LEFT HEART CATHETERIZATION WITH CORONARY/GRAFT ANGIOGRAM N/A 06/15/2013   Procedure: LEFT HEART CATHETERIZATION WITH Beatrix Fetters;  Surgeon: Candee Furbish, MD;  Location: [facility];  Service: Cardiovascular;  Laterality: N/A;  . UPPER GASTROINTESTINAL ENDOSCOPY      Current Outpatient Medications  Medication Sig Dispense Refill  . albuterol (PROAIR HFA) 108 (90 Base) MCG/ACT inhaler Inhale 2 puffs into the lungs every 4 (four) hours as needed for wheezing or shortness of breath. 1 Inhaler 3  . albuterol (PROVENTIL) (2.5 MG/3ML) 0.083% nebulizer solution Take 3 mLs (2.5 mg total) by nebulization every 6 (six) hours. 360 mL 12  . ALPRAZolam (XANAX) 0.5 MG tablet Take 0.5 mg by mouth at bedtime.     Marland Kitchen amLODipine (NORVASC) 5 MG tablet TAKE 1 TABLET BY MOUTH ONCE DAILY 90 tablet 3  . aspirin 81 MG tablet Take 243 mg by mouth at bedtime.     . Coenzyme Q10 (CO Q 10) 100 MG CAPS Take 100 mg by mouth at bedtime.     . colesevelam (WELCHOL) 625 MG tablet TAKE 3 TABLETS BY MOUTH TWICE DAILY WITH MEALS 540 tablet 3  . doxepin (SINEQUAN) 50 MG capsule  Take 100 mg by mouth at bedtime.     Marland Kitchen esomeprazole (NEXIUM) 20 MG capsule Take 20 mg by mouth 2 (two) times daily.    . Fluticasone-Umeclidin-Vilant (TRELEGY ELLIPTA) 100-62.5-25 MCG/INH AEPB Inhale 1 puff into the lungs daily. 1 each 5  . HUMALOG 100 UNIT/ML injection USE VIA PUMP DAILY. MAX DAILY DOSE 150 UNITS PER DAY.  6  . losartan (COZAAR) 50 MG tablet TAKE 1 TABLET BY MOUTH ONCE DAILY 90 tablet 3  . metFORMIN (GLUCOPHAGE) 500 MG tablet Take 1 tablet (500 mg total) by mouth 2 (two) times daily with a meal.    . metoprolol succinate (TOPROL-XL) 50 MG 24 hr tablet TAKE 1 TABLET BY MOUTH ONCE DAILY WITH  MEAL  OR  IMMEDIATELY  FOLLOWING  A  MEAL 90 tablet 3  . Multiple Vitamins-Minerals (CENTRUM SILVER 50+MEN PO) Take 1 tablet by mouth daily.    . nitroGLYCERIN (NITROSTAT) 0.4 MG SL tablet Place 1 tablet (0.4 mg total) under the  tongue every 5 (five) minutes as needed for chest pain. 25 tablet 3  . Omega-3 Fatty Acids (FISH OIL) 600 MG CAPS Take 600 mg by mouth 2 (two) times daily.     . pravastatin (PRAVACHOL) 40 MG tablet Take 1 tablet (40 mg total) daily by mouth. 90 tablet 3  . furosemide (LASIX) 20 MG tablet Take 1 tablet (20 mg total) by mouth daily as needed (ankle swelling). 30 tablet 3   Current Facility-Administered Medications  Medication Dose Route Frequency Provider Last Rate Last Dose  . 0.9 %  sodium chloride infusion  500 mL Intravenous Continuous Milus Banister, MD        Allergies:   No Known Allergies  Social History:  The patient  reports that he quit smoking about 20 years ago. His smoking use included cigarettes. He has a 70.00 pack-year smoking history. He has never used smokeless tobacco. He reports that he drinks alcohol. He reports that he does not use drugs.   Family History  Problem Relation Age of Onset  . Drug abuse Brother        Cocaine  . Heart disease Neg Hx   . Heart failure Neg Hx   . Diabetes Neg Hx   . Hypertension Neg Hx   . Colon cancer Neg  Hx   . Esophageal cancer Neg Hx   . Prostate cancer Neg Hx   . Pancreatic cancer Neg Hx   . Kidney disease Neg Hx   . Liver disease Neg Hx   . Stomach cancer Neg Hx   . Rectal cancer Neg Hx     ROS:  Unless specified above, all other review of systems negative  PHYSICAL EXAM: VS:  BP (!) 146/82   Pulse 76   Ht 5\' 10"  (1.778 m)   Wt 189 lb 9.6 oz (86 kg)   SpO2 99%   BMI 27.20 kg/m  GEN: Well nourished, well developed, in no acute distress  HEENT: normal  Neck: no JVD, carotid bruits, or masses Cardiac: RRR; no murmurs, rubs, or gallops,no edema  Respiratory:  clear to auscultation bilaterally, normal work of breathing GI: soft, nontender, nondistended, + BS MS: no deformity or atrophy, multiple scratches from puppies Skin: warm and dry, no rash Neuro:  Alert and Oriented x 3, Strength and sensation are intact Psych: euthymic mood, full affect   EKG:   EKG was done today. 11/27/16 shows sinus rhythm 75 nonspecific T-wave changes personally viewed-prior 12/05/15-sinus rhythm, 86, no other significant changes. Nonspecific ST-T wave changes.  11/17/14-normal rhythm, no other abnormalities, very subtle nonspecific T-wave changes in aVL.sinus rhythm, 73 with nonspecific ST-T wave changes    Cardiac catheterization 02/26/2018  Ost 1st Diag lesion is 75% stenosed. LIMA to diagonal is patent. Appears unchanged from prior cath.  Prox LAD lesion is 25% stenosed.  The left ventricular systolic function is normal.  LV end diastolic pressure is normal.  The left ventricular ejection fraction is 55-65% by visual estimate.  There is no aortic valve stenosis.   No change from prior cath.   Recommend Aspirin 81mg  daily for moderate CAD.    ASSESSMENT AND PLAN:  Shortness of breath -Chronic. 49% FEV1. [name]. Stable. He still remains quite active. -dyspnea on exertion was a previous anginal equivalent for him prior to his bypass surgery in 1999. Cardiac catheterization 2014 and  2019 as above. Reassuring. It is likely that his underlying COPD as driving the his dyspnea as well. (49% FEV1) Continue with  medical management. Continue with current aggressive secondary prevention. Thankfully, nuclear stress perfusion did not show any significant defects nor did cath show any changes.   COPD-[name] has been managing. Medications reviewed. No changes  Hypertension-well-controlled, meds reviewed.   Angina-has not needed to use nitroglycerin for some time. Doing well. Continue with current meds. Off of Imdur 30.HA side effects.  Seems to be doing well without it.  Coronary artery disease-prior bypass surgery 1999. Echocardiogram with normal EF. As above cath.   hyperlipidemia-lipids, continue current meds.  LDL 70 at last check.  Diabetes-continue through primary care team  psychiatry- depression-  He has been on doxepin for several years.  Stable.  We will see him back in 12 months  Signed, Candee Furbish, MD [facility]  03/07/2018 12:06 PM

## 2018-03-10 ENCOUNTER — Encounter: Payer: Self-pay | Admitting: Cardiology

## 2018-03-25 DIAGNOSIS — J209 Acute bronchitis, unspecified: Secondary | ICD-10-CM | POA: Diagnosis not present

## 2018-03-26 ENCOUNTER — Other Ambulatory Visit: Payer: Self-pay | Admitting: *Deleted

## 2018-03-26 DIAGNOSIS — G4733 Obstructive sleep apnea (adult) (pediatric): Secondary | ICD-10-CM

## 2018-03-26 DIAGNOSIS — G4710 Hypersomnia, unspecified: Secondary | ICD-10-CM | POA: Diagnosis not present

## 2018-03-27 DIAGNOSIS — G4710 Hypersomnia, unspecified: Secondary | ICD-10-CM | POA: Diagnosis not present

## 2018-03-31 ENCOUNTER — Telehealth: Payer: Self-pay | Admitting: Pulmonary Disease

## 2018-03-31 NOTE — Telephone Encounter (Signed)
Per AO, HST was negative for significant OSA. This test was compared to the previous test that he had done on 05/10/14 which was also negative for OSA.   Spoke with patient. He is aware of his results. However, he and his wife are still concerned about his snoring and him waking up feeling groggy in the mornings.   AO, do have you any suggestions on patient's snoring and grogginess? Please advise. Thanks!

## 2018-04-01 NOTE — Telephone Encounter (Signed)
Laurin Coder, MD  Valerie Salts, CMA; [facility] Triage Pool 2 hours ago (6:27 AM)    Avoid medications if he is on anything that may cause symptoms and if no meds, then, an in-Lab sleep study will be next step.  If all that is found on an in-lab study is snoring, an option will be an oral device fashioned by a dentist or surgery to look and fix any areas of obstruction.  CPAP will not be used for just snoring and there is no indication for CPAP if he does not have significant sleep apnea that we can document.   Routing comment    Called and spoke with patient he verbalized understanding. But would like to hold off on any further intervention at this time.

## 2018-04-02 NOTE — Telephone Encounter (Signed)
Noted. Thank you   Brian Mack FNP

## 2018-04-14 ENCOUNTER — Encounter: Payer: Self-pay | Admitting: Emergency Medicine

## 2018-04-14 ENCOUNTER — Ambulatory Visit (INDEPENDENT_AMBULATORY_CARE_PROVIDER_SITE_OTHER): Payer: Medicare Other | Admitting: Emergency Medicine

## 2018-04-14 DIAGNOSIS — J449 Chronic obstructive pulmonary disease, unspecified: Secondary | ICD-10-CM

## 2018-04-14 DIAGNOSIS — G4733 Obstructive sleep apnea (adult) (pediatric): Secondary | ICD-10-CM | POA: Diagnosis not present

## 2018-04-14 DIAGNOSIS — I200 Unstable angina: Secondary | ICD-10-CM | POA: Diagnosis not present

## 2018-04-14 DIAGNOSIS — Z23 Encounter for immunization: Secondary | ICD-10-CM | POA: Diagnosis not present

## 2018-04-14 MED ORDER — FLUTICASONE-UMECLIDIN-VILANT 100-62.5-25 MCG/INH IN AEPB: 1.0000 | INHALATION_SPRAY | Freq: Every day | RESPIRATORY_TRACT | 5 refills | Status: DC

## 2018-04-14 NOTE — Assessment & Plan Note (Signed)
Your home sleep study did not show significant obstructive sleep apnea.  If we continue to suspect sleep apnea based on your symptoms then we would need to send you to the sleep lab for a formal sleep study.

## 2018-04-14 NOTE — Assessment & Plan Note (Addendum)
He describes a recent nocturnal awakening with dyspnea, responded to albuterol.  He also was recently treated with prednisone about 3 weeks ago.  He has returned to baseline.  Unclear to me whether he has the same degree of control on the trilogy as he had on his Spiriva and Symbicort.  We will continue the Trelegy for now but depending on symptoms and flares we may decide to go back to his previous regimen.  Please continue Trelegy once daily as you have been taking it.  Remember to rinse and gargle after using. Keep albuterol available to use 2 puffs or 1 nebulizer treatment up to every 4 hours if needed for shortness of breath, chest tightness, wheezing. Flu shot today. You will need the Pneumovax sometime soon. We can discuss at your next visit.  Follow with Dr [name] in 6 months or sooner if you have any problems

## 2018-04-14 NOTE — Patient Instructions (Addendum)
Please continue Trelegy once daily as you have been taking it.  Remember to rinse and gargle after using. Keep albuterol available to use 2 puffs or 1 nebulizer treatment up to every 4 hours if needed for shortness of breath, chest tightness, wheezing. Flu shot today. You will need the Pneumovax sometime soon. We can discuss at your next visit.  Your home sleep study did not show significant obstructive sleep apnea.  If we continue to suspect sleep apnea based on your symptoms then we would need to send you to the sleep lab for a formal sleep study. Follow with Dr [name] in 6 months or sooner if you have any problems

## 2018-04-14 NOTE — Addendum Note (Signed)
Addended by: Desmond Dike C on: 04/14/2018 04:20 PM   Modules accepted: Orders

## 2018-04-14 NOTE — Progress Notes (Signed)
Mr. Khader is a 72 year old gentleman with known coronary artery disease status post CABG. Also has a history of significant tobacco abuse and airflow limitation on pulmonary function testing. He has a positive bronchodilator response. Severe COPD. He has OSA, not on CPAP.   ROV 04/14/18 --72 year old man with severe COPD, untreated sleep apnea, CAD/CABG, iron deficiency, hypertension, allergic rhinitis, GERD.  I seen him for his COPD and exertional dyspnea.  He is also noticed some significant lack of energy.  Decreased functional capacity.  He had a normal CBC, TSH, BMP 01/30/2018.  I tried changing him to Trelegy to see if he would benefit. He feels that he has benefited from it. Rare albuterol use. He still works. He reports that he was treated with pred for wheeze / cough about 3 weeks ago.   He underwent a home sleep study 03/26/18 that did not confirm obstructive sleep apnea, although he still has sleepiness, snoring, grogginess. He has had some episodes of waking with dyspnea - no associated wheeze, may respond to albuterol. Denies any reflux as a trigger. It has only happened 3-4 times since I've known him. Last time was this summer, responded to albuterol.    EXAM:  Vitals:   04/14/18 1553  BP: 122/78  Pulse: 87  SpO2: 96%  Weight: 183 lb 12.8 oz (83.4 kg)  Height: 5\' 10"  (1.778 m)   Gen: Pleasant, well-nourished, in no distress,  normal affect  ENT: No lesions,  mouth clear,  oropharynx clear, no postnasal drip  Neck: No JVD, no stridor  Lungs: No use of accessory muscles, no wheezes  Cardiovascular: RRR, heart sounds normal, no murmur or gallops, no peripheral edema  Musculoskeletal: No deformities, no cyanosis or clubbing  Neuro: alert, non focal  Skin: Warm, no lesions or rashes   GOLD COPD B-C He describes a recent nocturnal awakening with dyspnea, responded to albuterol.  He also was recently treated with prednisone about 3 weeks ago.  He has returned to baseline.   Unclear to me whether he has the same degree of control on the trilogy as he had on his Spiriva and Symbicort.  We will continue the Trelegy for now but depending on symptoms and flares we may decide to go back to his previous regimen.  Please continue Trelegy once daily as you have been taking it.  Remember to rinse and gargle after using. Keep albuterol available to use 2 puffs or 1 nebulizer treatment up to every 4 hours if needed for shortness of breath, chest tightness, wheezing. Flu shot today. You will need the Pneumovax sometime soon. We can discuss at your next visit.  Follow with Dr [name] in 6 months or sooner if you have any problems  Obstructive sleep apnea Your home sleep study did not show significant obstructive sleep apnea.  If we continue to suspect sleep apnea based on your symptoms then we would need to send you to the sleep lab for a formal sleep study.    Baltazar Apo, MD, PhD 04/14/2018, 4:17 PM [facility] [phone] or if no answer [phone]

## 2018-05-26 ENCOUNTER — Other Ambulatory Visit: Payer: Self-pay | Admitting: Cardiology

## 2018-06-09 DIAGNOSIS — E7800 Pure hypercholesterolemia, unspecified: Secondary | ICD-10-CM | POA: Diagnosis not present

## 2018-06-09 DIAGNOSIS — E1165 Type 2 diabetes mellitus with hyperglycemia: Secondary | ICD-10-CM | POA: Diagnosis not present

## 2018-06-09 DIAGNOSIS — Z9641 Presence of insulin pump (external) (internal): Secondary | ICD-10-CM | POA: Diagnosis not present

## 2018-06-09 DIAGNOSIS — E02 Subclinical iodine-deficiency hypothyroidism: Secondary | ICD-10-CM | POA: Diagnosis not present

## 2018-06-09 DIAGNOSIS — M19049 Primary osteoarthritis, unspecified hand: Secondary | ICD-10-CM | POA: Diagnosis not present

## 2018-06-09 DIAGNOSIS — I10 Essential (primary) hypertension: Secondary | ICD-10-CM | POA: Diagnosis not present

## 2018-06-09 DIAGNOSIS — E049 Nontoxic goiter, unspecified: Secondary | ICD-10-CM | POA: Diagnosis not present

## 2018-06-19 ENCOUNTER — Telehealth: Payer: Self-pay | Admitting: Emergency Medicine

## 2018-06-19 NOTE — Telephone Encounter (Signed)
H1N1 07/09/2008         Influenza Split 06/21/2015, 05/06/2012        Influenza Whole 05/07/2011, 05/23/2010        Influenza, High Dose Seasonal PF 04/14/2018, 05/23/2016        Influenza,inj,Quad PF,6+ Mos 05/07/2014, 05/01/2013        Influenza-Unspecified 04/01/2017        Pneumococcal Conjugate-13 03/09/2014        Pneumococcal Polysaccharide-23 05/23/2010        Zoster 12/05/2014      Per Aaron Edelman- based on the above immunization record, pt requires one more pneumo 23  I have scheduled this on the injection schedule, as he did not want to wait until next ov  I advised he already had the high dose flu shot so no need for another flu shot Pt aware of office phone and location

## 2018-06-23 ENCOUNTER — Ambulatory Visit (INDEPENDENT_AMBULATORY_CARE_PROVIDER_SITE_OTHER): Payer: Medicare Other

## 2018-06-23 DIAGNOSIS — Z23 Encounter for immunization: Secondary | ICD-10-CM | POA: Diagnosis not present

## 2018-06-24 NOTE — Progress Notes (Signed)
Documented by Parke Poisson CMA based on hand-written Pneumonia documentation sheet completed by [facility] CMA, who administered the medication.

## 2018-07-11 DIAGNOSIS — J209 Acute bronchitis, unspecified: Secondary | ICD-10-CM | POA: Diagnosis not present

## 2018-07-16 ENCOUNTER — Ambulatory Visit (INDEPENDENT_AMBULATORY_CARE_PROVIDER_SITE_OTHER): Payer: Medicare Other | Admitting: Nurse Practitioner

## 2018-07-16 ENCOUNTER — Encounter: Payer: Self-pay | Admitting: Nurse Practitioner

## 2018-07-16 VITALS — BP 146/78 | HR 80 | Temp 97.9°F | Ht 70.0 in | Wt 191.4 lb

## 2018-07-16 DIAGNOSIS — J069 Acute upper respiratory infection, unspecified: Secondary | ICD-10-CM

## 2018-07-16 MED ORDER — DOXYCYCLINE HYCLATE 100 MG PO TABS: 100.0000 mg | ORAL_TABLET | Freq: Two times a day (BID) | ORAL | 0 refills | Status: DC

## 2018-07-16 MED ORDER — FLUTICASONE-UMECLIDIN-VILANT 100-62.5-25 MCG/INH IN AEPB: 1.0000 | INHALATION_SPRAY | Freq: Every day | RESPIRATORY_TRACT | 0 refills | Status: AC

## 2018-07-16 NOTE — Assessment & Plan Note (Signed)
Considering worsening symptoms - will treat for URI Patient Instructions  Please finish prednisone taper Continue trelegy and albuterol Will start doxycycline May take mucinex twice daily May take delsym twice daily Follow up in 2 weeks or sooner if needed

## 2018-07-16 NOTE — Patient Instructions (Signed)
Please finish prednisone taper Continue trelegy and albuterol Will start doxycycline May take mucinex twice daily May take delsym twice daily Follow up in 2 weeks or sooner if needed

## 2018-07-16 NOTE — Progress Notes (Signed)
@Patient  ID: Oscar Robinson, male    DOB: 01-30-1946, 73 y.o.   MRN: [id number]  Chief Complaint  Patient presents with  . Cough    with congestion    Referring provider: [name], MD  HPI 72 year old male former smoker severe COPD and untreated sleep apnea followed by [name].  Tests: 07/16/18 - No active pulmonary disease. Mild pulmonary hyperinflation. Stable post CABG change.  PFT:  PFT Results Latest Ref Rng & Units 04/07/2014  FVC-Pre L 2.49  FVC-Predicted Pre % 57  FVC-Post L 2.84  FVC-Predicted Post % 65  Pre FEV1/FVC % % 51  Post FEV1/FCV % % 56  FEV1-Pre L 1.28  FEV1-Predicted Pre % 39  FEV1-Post L 1.60  DLCO UNC% % 47  DLCO COR %Predicted % 57  TLC L 6.10  TLC % Predicted % 89  RV % Predicted % 145    OV 07/16/18 - Acute cough and congestion Patient presents today with cough and chest congestion.  He reports brown/green sputum cough.  States that symptoms are progressively worsening.  He was seen by his PCP on 07/12/2018 and was given prednisone.  He has been taking it as directed but states his symptoms are still worsening.  He states that his symptoms originally started on 07/10/2018.  He denies any fever, shortness of breath, or chest pain.  Edema.  He is compliant with medications.  He is taking Trelegy and albuterol.     No Known Allergies  Immunization History  Administered Date(s) Administered  . H1N1 07/09/2008  . Influenza Split 05/06/2012, 06/21/2015  . Influenza Whole 05/23/2010, 05/07/2011  . Influenza, High Dose Seasonal PF 05/23/2016, 04/14/2018  . Influenza,inj,Quad PF,6+ Mos 05/01/2013, 05/07/2014  . Influenza-Unspecified 04/01/2017  . Pneumococcal Conjugate-13 03/09/2014  . Pneumococcal Polysaccharide-23 05/23/2010, 06/23/2018  . Zoster 12/05/2014    Past Medical History:  Diagnosis Date  . Anemia   . Anxiety   . Arthritis   . CKD (chronic kidney disease), stage II   . COPD (chronic obstructive pulmonary disease)  ([place])   . Coronary artery disease    a. s/p CABG 1999.  . Depression   . Diabetes mellitus   . Diverticulosis   . GERD (gastroesophageal reflux disease)   . Hiatal hernia   . Hypercholesteremia   . Hypertension   . Sleep apnea    had sleep study and negative per pt  . Thyroid disease   . Tobacco abuse   . Tubular adenoma of colon     Tobacco History: Social History   Tobacco Use  Smoking Status Former Smoker  . Packs/day: 2.00  . Years: 35.00  . Pack years: 70.00  . Types: Cigarettes  . Last attempt to quit: 09/27/1997  . Years since quitting: 20.8  Smokeless Tobacco Never Used   Counseling given: Yes   Outpatient Encounter Medications as of 07/16/2018  Medication Sig  . albuterol (PROAIR HFA) 108 (90 Base) MCG/ACT inhaler Inhale 2 puffs into the lungs every 4 (four) hours as needed for wheezing or shortness of breath.  Marland Kitchen albuterol (PROVENTIL) (2.5 MG/3ML) 0.083% nebulizer solution Take 3 mLs (2.5 mg total) by nebulization every 6 (six) hours.  . ALPRAZolam (XANAX) 0.5 MG tablet Take 0.5 mg by mouth at bedtime.   Marland Kitchen amLODipine (NORVASC) 5 MG tablet TAKE 1 TABLET BY MOUTH ONCE DAILY  . aspirin 81 MG tablet Take 243 mg by mouth at bedtime.   . Coenzyme Q10 (CO Q 10) 100 MG CAPS Take  100 mg by mouth at bedtime.   . colesevelam (WELCHOL) 625 MG tablet TAKE 3 TABLETS BY MOUTH TWICE DAILY WITH MEALS  . doxepin (SINEQUAN) 50 MG capsule Take 100 mg by mouth at bedtime.   Marland Kitchen esomeprazole (NEXIUM) 20 MG capsule Take 20 mg by mouth 2 (two) times daily.  . Fluticasone-Umeclidin-Vilant (TRELEGY ELLIPTA) 100-62.5-25 MCG/INH AEPB Inhale 1 puff into the lungs daily.  . furosemide (LASIX) 20 MG tablet Take 1 tablet (20 mg total) by mouth daily as needed (ankle swelling).  Marland Kitchen HUMALOG 100 UNIT/ML injection USE VIA PUMP DAILY. MAX DAILY DOSE 150 UNITS PER DAY.  . Iron-Vitamins (GERITOL COMPLETE PO) Take 1 tablet by mouth daily.  Marland Kitchen losartan (COZAAR) 50 MG tablet TAKE 1 TABLET BY MOUTH ONCE  DAILY  . metFORMIN (GLUCOPHAGE) 500 MG tablet Take 1 tablet (500 mg total) by mouth 2 (two) times daily with a meal.  . metoprolol succinate (TOPROL-XL) 50 MG 24 hr tablet TAKE 1 TABLET BY MOUTH ONCE DAILY WITH  MEAL  OR  IMMEDIATELY  FOLLOWING  A  MEAL  . nitroGLYCERIN (NITROSTAT) 0.4 MG SL tablet Place 1 tablet (0.4 mg total) under the tongue every 5 (five) minutes as needed for chest pain.  . Omega-3 Fatty Acids (FISH OIL) 600 MG CAPS Take 600 mg by mouth 2 (two) times daily.   . pravastatin (PRAVACHOL) 40 MG tablet TAKE 1 TABLET BY MOUTH ONCE DAILY  . predniSONE (DELTASONE) 10 MG tablet TAKE 6 TABLETS BY MOUTH ONCE DAILY FOR 2 DAYS THEN TAKE 5 TABLETS ONCE DAILY FOR 2 DAYS THEN TAKE 4 TABLETS ONCE DAILY FOR 2 DAYS THEN TAKE  . SPIRIVA HANDIHALER 18 MCG inhalation capsule INHALE THE CONTENTS OF 1 CAPSULE INTO THE LUNGS ONCE DAILY  . doxycycline (VIBRA-TABS) 100 MG tablet Take 1 tablet (100 mg total) by mouth 2 (two) times daily.  . Fluticasone-Umeclidin-Vilant (TRELEGY ELLIPTA) 100-62.5-25 MCG/INH AEPB Inhale 1 puff into the lungs daily.   Facility-Administered Encounter Medications as of 07/16/2018  Medication  . 0.9 %  sodium chloride infusion     Review of Systems  Review of Systems  Constitutional: Negative.  Negative for chills and fever.  HENT: Negative.   Respiratory: Positive for cough and shortness of breath. Negative for wheezing.   Cardiovascular: Negative.  Negative for chest pain, palpitations and leg swelling.  Gastrointestinal: Negative.   Allergic/Immunologic: Negative.   Neurological: Negative.   Psychiatric/Behavioral: Negative.        Physical Exam  BP (!) 146/78 (BP Location: Left Arm, Patient Position: Sitting, Cuff Size: Normal)   Pulse 80   Temp 97.9 F (36.6 C)   Ht 5\' 10"  (1.778 m)   Wt 191 lb 6.4 oz (86.8 kg)   SpO2 99%   BMI 27.46 kg/m   Wt Readings from Last 5 Encounters:  07/16/18 191 lb 6.4 oz (86.8 kg)  04/14/18 183 lb 12.8 oz (83.4 kg)    03/07/18 189 lb 9.6 oz (86 kg)  03/03/18 188 lb (85.3 kg)  02/26/18 185 lb (83.9 kg)     Physical Exam  Constitutional: He is oriented to person, place, and time. He appears well-developed and well-nourished. No distress.  Cardiovascular: Normal rate and regular rhythm.  Pulmonary/Chest: Effort normal and breath sounds normal. No respiratory distress. He has no wheezes.  Musculoskeletal: He exhibits no edema.  Neurological: He is alert and oriented to person, place, and time.  Skin: Skin is warm and dry.  Psychiatric: He has a normal mood and  affect.  Nursing note and vitals reviewed.      Assessment & Plan:   Upper respiratory tract infection Considering worsening symptoms - will treat for URI Patient Instructions  Please finish prednisone taper Continue trelegy and albuterol Will start doxycycline May take mucinex twice daily May take delsym twice daily Follow up in 2 weeks or sooner if needed       Fenton Foy, NP 07/16/2018

## 2018-07-21 ENCOUNTER — Other Ambulatory Visit: Payer: Self-pay | Admitting: Emergency Medicine

## 2018-07-22 DIAGNOSIS — E119 Type 2 diabetes mellitus without complications: Secondary | ICD-10-CM | POA: Diagnosis not present

## 2018-07-22 DIAGNOSIS — L851 Acquired keratosis [keratoderma] palmaris et plantaris: Secondary | ICD-10-CM | POA: Diagnosis not present

## 2018-07-31 ENCOUNTER — Other Ambulatory Visit (INDEPENDENT_AMBULATORY_CARE_PROVIDER_SITE_OTHER): Payer: Medicare Other

## 2018-07-31 ENCOUNTER — Encounter: Payer: Self-pay | Admitting: Nurse Practitioner

## 2018-07-31 ENCOUNTER — Ambulatory Visit (INDEPENDENT_AMBULATORY_CARE_PROVIDER_SITE_OTHER): Payer: Medicare Other | Admitting: Nurse Practitioner

## 2018-07-31 VITALS — BP 136/74 | HR 84 | Ht 70.0 in | Wt 194.2 lb

## 2018-07-31 DIAGNOSIS — D509 Iron deficiency anemia, unspecified: Secondary | ICD-10-CM

## 2018-07-31 DIAGNOSIS — J449 Chronic obstructive pulmonary disease, unspecified: Secondary | ICD-10-CM

## 2018-07-31 LAB — CBC WITH DIFFERENTIAL/PLATELET
Basophils Absolute: 0.1 10*3/uL (ref 0.0–0.1)
Basophils Relative: 0.9 % (ref 0.0–3.0)
Eosinophils Absolute: 0.2 10*3/uL (ref 0.0–0.7)
Eosinophils Relative: 1.9 % (ref 0.0–5.0)
HCT: 39.2 % (ref 39.0–52.0)
Hemoglobin: 12.9 g/dL — ABNORMAL LOW (ref 13.0–17.0)
Lymphocytes Relative: 23.7 % (ref 12.0–46.0)
Lymphs Abs: 2 10*3/uL (ref 0.7–4.0)
MCHC: 33.1 g/dL (ref 30.0–36.0)
MCV: 92.7 fl (ref 78.0–100.0)
MONO ABS: 1.1 10*3/uL — AB (ref 0.1–1.0)
Monocytes Relative: 12.7 % — ABNORMAL HIGH (ref 3.0–12.0)
Neutro Abs: 5.3 10*3/uL (ref 1.4–7.7)
Neutrophils Relative %: 60.8 % (ref 43.0–77.0)
Platelets: 204 10*3/uL (ref 150.0–400.0)
RBC: 4.22 Mil/uL (ref 4.22–5.81)
RDW: 14.9 % (ref 11.5–15.5)
WBC: 8.6 10*3/uL (ref 4.0–10.5)

## 2018-07-31 LAB — FERRITIN: Ferritin: 20.6 ng/mL — ABNORMAL LOW (ref 22.0–322.0)

## 2018-07-31 LAB — IBC PANEL
Iron: 116 ug/dL (ref 42–165)
Saturation Ratios: 33.5 % (ref 20.0–50.0)
Transferrin: 247 mg/dL (ref 212.0–360.0)

## 2018-07-31 NOTE — Progress Notes (Signed)
@Patient  ID: Oscar Robinson, male    DOB: 02/11/1946, 72 y.o.   MRN: [id number]  Chief Complaint  Patient presents with  . Follow-up    Referring provider: [name], MD  HPI 72 year old male former smoker severe COPD and untreated sleep apnea followed by [name].  Tests: 01/28/18 - No active pulmonary disease. Mild pulmonary hyperinflation. Stable post CABG change.  PFT Results Latest Ref Rng & Units 04/07/2014  FVC-Pre L 2.49  FVC-Predicted Pre % 57  FVC-Post L 2.84  FVC-Predicted Post % 65  Pre FEV1/FVC % % 51  Post FEV1/FCV % % 56  FEV1-Pre L 1.28  FEV1-Predicted Pre % 39  FEV1-Post L 1.60  DLCO UNC% % 47  DLCO COR %Predicted % 57  TLC L 6.10  TLC % Predicted % 89  RV % Predicted % 145   OV 07/31/18 - follow up URI Patient presents today for a follow up. He was seen by me on 07/16/18 and was treated for upper respiratory tract infection with doxycycline. He states that he is better now. All symptoms have resolved. He was switched to trelegy a few months ago and states that he could not tolerate it. It caused throat irritation. He has started back on his previous prescriptions of Symbicort and Spiriva. He states that these inhalers are working well for him. He denies any chest pain, fever, or edema.    No Known Allergies  Immunization History  Administered Date(s) Administered  . H1N1 07/09/2008  . Influenza Split 05/06/2012, 06/21/2015  . Influenza Whole 05/23/2010, 05/07/2011  . Influenza, High Dose Seasonal PF 05/23/2016, 04/14/2018  . Influenza,inj,Quad PF,6+ Mos 05/01/2013, 05/07/2014  . Influenza-Unspecified 04/01/2017  . Pneumococcal Conjugate-13 03/09/2014  . Pneumococcal Polysaccharide-23 05/23/2010, 06/23/2018  . Zoster 12/05/2014    Past Medical History:  Diagnosis Date  . Anemia   . Anxiety   . Arthritis   . CKD (chronic kidney disease), stage II   . COPD (chronic obstructive pulmonary disease) ([place])   . Coronary artery disease    a. s/p CABG 1999.  . Depression   . Diabetes mellitus   . Diverticulosis   . GERD (gastroesophageal reflux disease)   . Hiatal hernia   . Hypercholesteremia   . Hypertension   . Sleep apnea    had sleep study and negative per pt  . Thyroid disease   . Tobacco abuse   . Tubular adenoma of colon     Tobacco History: Social History   Tobacco Use  Smoking Status Former Smoker  . Packs/day: 2.00  . Years: 35.00  . Pack years: 70.00  . Types: Cigarettes  . Last attempt to quit: 09/27/1997  . Years since quitting: 20.8  Smokeless Tobacco Never Used   Counseling given: Yes   Outpatient Encounter Medications as of 07/31/2018  Medication Sig  . albuterol (PROAIR HFA) 108 (90 Base) MCG/ACT inhaler Inhale 2 puffs into the lungs every 4 (four) hours as needed for wheezing or shortness of breath.  Marland Kitchen albuterol (PROVENTIL) (2.5 MG/3ML) 0.083% nebulizer solution Take 3 mLs (2.5 mg total) by nebulization every 6 (six) hours.  . ALPRAZolam (XANAX) 0.5 MG tablet Take 0.5 mg by mouth at bedtime.   Marland Kitchen amLODipine (NORVASC) 5 MG tablet TAKE 1 TABLET BY MOUTH ONCE DAILY  . aspirin 81 MG tablet Take 243 mg by mouth at bedtime.   . Coenzyme Q10 (CO Q 10) 100 MG CAPS Take 100 mg by mouth at bedtime.   . colesevelam (  WELCHOL) 625 MG tablet TAKE 3 TABLETS BY MOUTH TWICE DAILY WITH MEALS  . doxepin (SINEQUAN) 50 MG capsule Take 100 mg by mouth at bedtime.   Marland Kitchen esomeprazole (NEXIUM) 20 MG capsule Take 20 mg by mouth 2 (two) times daily.  . furosemide (LASIX) 20 MG tablet Take 1 tablet (20 mg total) by mouth daily as needed (ankle swelling).  Marland Kitchen HUMALOG 100 UNIT/ML injection USE VIA PUMP DAILY. MAX DAILY DOSE 150 UNITS PER DAY.  . Iron-Vitamins (GERITOL COMPLETE PO) Take 1 tablet by mouth daily.  Marland Kitchen losartan (COZAAR) 50 MG tablet TAKE 1 TABLET BY MOUTH ONCE DAILY  . metFORMIN (GLUCOPHAGE) 500 MG tablet Take 1 tablet (500 mg total) by mouth 2 (two) times daily with a meal.  . metoprolol succinate (TOPROL-XL)  50 MG 24 hr tablet TAKE 1 TABLET BY MOUTH ONCE DAILY WITH  MEAL  OR  IMMEDIATELY  FOLLOWING  A  MEAL  . nitroGLYCERIN (NITROSTAT) 0.4 MG SL tablet Place 1 tablet (0.4 mg total) under the tongue every 5 (five) minutes as needed for chest pain.  . Omega-3 Fatty Acids (FISH OIL) 600 MG CAPS Take 600 mg by mouth 2 (two) times daily.   . pravastatin (PRAVACHOL) 40 MG tablet TAKE 1 TABLET BY MOUTH ONCE DAILY  . SPIRIVA HANDIHALER 18 MCG inhalation capsule INHALE THE CONTENTS OF 1 CAPSULE INTO THE LUNGS ONCE DAILY  . SYMBICORT 160-4.5 MCG/ACT inhaler INHALE 2 PUFFS BY MOUTH EVERY 12 HOURS, RINSE MOUTH AFTER EVERY USE  . Fluticasone-Umeclidin-Vilant (TRELEGY ELLIPTA) 100-62.5-25 MCG/INH AEPB Inhale 1 puff into the lungs daily. (Patient not taking: Reported on 07/31/2018)  . Fluticasone-Umeclidin-Vilant (TRELEGY ELLIPTA) 100-62.5-25 MCG/INH AEPB Inhale 1 puff into the lungs daily. (Patient not taking: Reported on 07/31/2018)  . [DISCONTINUED] doxycycline (VIBRA-TABS) 100 MG tablet Take 1 tablet (100 mg total) by mouth 2 (two) times daily. (Patient not taking: Reported on 07/31/2018)  . [DISCONTINUED] predniSONE (DELTASONE) 10 MG tablet TAKE 6 TABLETS BY MOUTH ONCE DAILY FOR 2 DAYS THEN TAKE 5 TABLETS ONCE DAILY FOR 2 DAYS THEN TAKE 4 TABLETS ONCE DAILY FOR 2 DAYS THEN TAKE   Facility-Administered Encounter Medications as of 07/31/2018  Medication  . 0.9 %  sodium chloride infusion     Review of Systems  Review of Systems  Constitutional: Negative.  Negative for chills and fever.  HENT: Negative.  Negative for congestion.   Respiratory: Negative for cough and shortness of breath.   Cardiovascular: Negative.  Negative for chest pain, palpitations and leg swelling.  Gastrointestinal: Negative.   Allergic/Immunologic: Negative.   Neurological: Negative.   Psychiatric/Behavioral: Negative.        Physical Exam  BP 136/74 (BP Location: Right Arm, Patient Position: Sitting, Cuff Size: Normal)    Pulse 84   Ht 5\' 10"  (1.778 m)   Wt 194 lb 3.2 oz (88.1 kg)   SpO2 96%   BMI 27.86 kg/m   Wt Readings from Last 5 Encounters:  07/31/18 194 lb 3.2 oz (88.1 kg)  07/16/18 191 lb 6.4 oz (86.8 kg)  04/14/18 183 lb 12.8 oz (83.4 kg)  03/07/18 189 lb 9.6 oz (86 kg)  03/03/18 188 lb (85.3 kg)     Physical Exam Vitals signs and nursing note reviewed.  Constitutional:      General: He is not in acute distress.    Appearance: He is well-developed.  Cardiovascular:     Rate and Rhythm: Normal rate and regular rhythm.  Pulmonary:     Effort: Pulmonary  effort is normal. No respiratory distress.     Breath sounds: Normal breath sounds. No wheezing or rhonchi.  Musculoskeletal:        General: No swelling.  Skin:    General: Skin is warm and dry.  Neurological:     Mental Status: He is alert and oriented to person, place, and time.        Assessment & Plan:   GOLD COPD B-C URI resolved  Patient Instructions  Glad that you are doing better! Patient could not tolerate Trelegy - and restarted his Spiriva and Symbicort Continue Spiriva and Symbicort Continue proventil as needed Stay active - increase activity as tolerated Follow up with [name] at his first available appointment in around 4 weeks Please follow up sooner if needed        Fenton Foy, NP 08/01/2018

## 2018-07-31 NOTE — Patient Instructions (Signed)
Glad that you are doing better! Patient could not tolerate Trelegy - and restarted his Spiriva and Symbicort Continue Spiriva and Symbicort Continue proventil as needed Stay active - increase activity as tolerated Follow up with [name] at his first available appointment in around 4 weeks Please follow up sooner if needed

## 2018-08-01 ENCOUNTER — Other Ambulatory Visit: Payer: Self-pay

## 2018-08-01 ENCOUNTER — Encounter: Payer: Self-pay | Admitting: Nurse Practitioner

## 2018-08-01 DIAGNOSIS — D509 Iron deficiency anemia, unspecified: Secondary | ICD-10-CM

## 2018-08-01 NOTE — Assessment & Plan Note (Signed)
URI resolved  Patient Instructions  Glad that you are doing better! Patient could not tolerate Trelegy - and restarted his Spiriva and Symbicort Continue Spiriva and Symbicort Continue proventil as needed Stay active - increase activity as tolerated Follow up with [name] at his first available appointment in around 4 weeks Please follow up sooner if needed

## 2018-08-05 ENCOUNTER — Ambulatory Visit: Payer: Medicare Other | Admitting: Cardiology

## 2018-08-08 ENCOUNTER — Other Ambulatory Visit: Payer: Self-pay | Admitting: Emergency Medicine

## 2018-08-21 ENCOUNTER — Ambulatory Visit (INDEPENDENT_AMBULATORY_CARE_PROVIDER_SITE_OTHER): Payer: Medicare Other | Admitting: Cardiology

## 2018-08-21 ENCOUNTER — Encounter: Payer: Self-pay | Admitting: Cardiology

## 2018-08-21 VITALS — BP 140/70 | HR 80 | Ht 70.0 in | Wt 193.0 lb

## 2018-08-21 DIAGNOSIS — Z794 Long term (current) use of insulin: Secondary | ICD-10-CM | POA: Diagnosis not present

## 2018-08-21 DIAGNOSIS — E119 Type 2 diabetes mellitus without complications: Secondary | ICD-10-CM | POA: Diagnosis not present

## 2018-08-21 DIAGNOSIS — E785 Hyperlipidemia, unspecified: Secondary | ICD-10-CM | POA: Diagnosis not present

## 2018-08-21 DIAGNOSIS — I2510 Atherosclerotic heart disease of native coronary artery without angina pectoris: Secondary | ICD-10-CM

## 2018-08-21 DIAGNOSIS — I10 Essential (primary) hypertension: Secondary | ICD-10-CM | POA: Diagnosis not present

## 2018-08-21 DIAGNOSIS — R0602 Shortness of breath: Secondary | ICD-10-CM

## 2018-08-21 NOTE — Patient Instructions (Addendum)
Medication Instructions:   Your physician recommends that you continue on your current medications as directed. Please refer to the Current Medication list given to you today.   If you need a refill on your cardiac medications before your next appointment, please call your pharmacy.   Lab work:  RETURN  FOR CMP AND LFT IN  [place]   If you have labs (blood work) drawn today and your tests are completely normal, you will receive your results only by: Marland Kitchen MyChart Message (if you have MyChart) OR . A paper copy in the mail If you have any lab test that is abnormal or we need to change your treatment, we will call you to review the results.   Testing/Procedures: NONE ORDERED  TODAY    Follow-Up:  WITH DR Marlou Porch  IN June   Any Other Special Instructions Will Be Listed Below (If Applicable).

## 2018-08-21 NOTE — Progress Notes (Signed)
Cardiology Office Note   Date:  08/23/2018   ID:  [name], DOB [birth year], MRN [id number]  PCP:  [name], MD  Cardiologist:  Dr. Marlou Porch     Chief Complaint  Patient presents with  . Coronary Artery Disease      History of Present Illness: Oscar Robinson is a 73 y.o. male who presents for CAD,    He has a hx of coronary artery disease status post bypass surgery 1999, significant tobacco use, COPD with recent increasing shortness of breath, dyspnea on exertion here at the request of [name] for further evaluation in the past..  Dyspnea with exertional with no chest pain, currently moderate in severity, relieved with rest with no obvious exacerbating factors, non-positional. No palpitations. Prior to CABG had symptoms of dyspnea mostly. Fatigue.   Last stress test was about 2 years ago he thinks in [place] at [facility]. [name] it was OK.   06/11/13 he underwent nuclear stress test where he had marked ST segment depression diffusely during exercise, relieved with rest as well as marked shortness of breath, pale. Blood pressure also decreased during exercise.  07/10/13 -native vessel LAD/diagonal bifurcation disease with patent LIMA to LAD/diagonal albeit small caliber vessel. Normal perfusion pattern on nuclear stress test. My concern previously was ST segment depression as well as decrease in blood pressure during exercise. This is described above. Ejection fraction 60%. We decided to continue with medical management. Metoprolol, amlodipine, pravastatin. No PCI.  11/18/13-he has been having some complaints of dysphagia, food getting stuck in his upper throat. One of his friends died of esophageal cancer. He is a former smoker. He still having shortness of breath. Getting treated for COPD by [name]. Prior cardiac catheterization reviewed with him.   11/17/14- still having dyspnea on exertion. No significant changes however over the past year. He enjoys  breeding dogs. He is quite active. In review of [name] note, consideration to pulmonary rehabilitation in future. Prior cardiac catheterization reviewed once again.   05/16/15- recent episode of severe shortness of breath. Woke up, thought he was going to die. Currently on prednisone taper. Having wild dreams. Lower extremity edema noted. Weight is slightly increased. He is worried about taking doxepin long-term. Would like to talk to a psychiatrist regarding this. He also is worried about undergoing cardiopulmonary exercise test as he "went out" with previous study. Glucose was 26 after he just started his insulin pump.  12/05/15-overall feeling okay. Shortness of breath chronic from COPD. Has been seeing [name], [name]. Underwent cardiopulmonary exercise test on 07/08/15 which showed fairly significant ventilatory obstruction. Chest x-ray on 07/21/15 shows COPD without any evidence of pneumonia. No chest pain. Does well.  11/27/16 - feels tired. Been dx with iron def. Has diarrhea. Denies any anginal symptoms. Has overall been doing fairly well. [name] saw him last and diagnosed iron deficiency anemia. He has been seen in gastroenterology.  03/07/18 - mild ankle edema, would like lasix.  Has several scratches on his legs from puppies that he was recently working with.  He has not been having any chest pain.  Baseline shortness of breath.  He did not like taking isosorbide, caused headache.  He is stopped.  I am fine with this.  No syncope bleeding orthopnea PND.  Today he has no chest pain and he is recovering from bronchitis and is recovering but some SOB and some wheezes.  He had one episode in Oct and woke from sleep  with acute SOB which resolved and no chest pain.  He has 3 of these episodes total over the last few years.  [name] did not have an answer.  His last cath was 02/2018 and there were no acute changes from previous cath. His EF was normal.  He is on ASA 81 mg but takes three  daily.  Discussed that dose puts him at increased risk for GI bleed.  He understands.  He is active plans on a cruise to [place] in August.      Past Medical History:  Diagnosis Date  . Anemia   . Anxiety   . Arthritis   . CKD (chronic kidney disease), stage II   . COPD (chronic obstructive pulmonary disease) ([place])   . Coronary artery disease    a. s/p CABG 1999.  . Depression   . Diabetes mellitus   . Diverticulosis   . GERD (gastroesophageal reflux disease)   . Hiatal hernia   . Hypercholesteremia   . Hypertension   . Sleep apnea    had sleep study and negative per pt  . Thyroid disease   . Tobacco abuse   . Tubular adenoma of colon     Past Surgical History:  Procedure Laterality Date  . COLONOSCOPY    . CORONARY ARTERY BYPASS GRAFT  1999  . 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 HAND SURGERY     lt palm,ring finger,  . HAND SURGERY Left    pinky finger  . HERNIA REPAIR  1996   lt/rt ing hernia  . LEFT HEART CATH AND CORS/GRAFTS ANGIOGRAPHY N/A 02/26/2018   Procedure: LEFT HEART CATH AND CORS/GRAFTS ANGIOGRAPHY;  Surgeon: Jettie Booze, MD;  Location: [facility];  Service: Cardiovascular;  Laterality: N/A;  . LEFT HEART CATHETERIZATION WITH CORONARY/GRAFT ANGIOGRAM N/A 06/15/2013   Procedure: LEFT HEART CATHETERIZATION WITH Beatrix Fetters;  Surgeon: Candee Furbish, MD;  Location: [facility];  Service: Cardiovascular;  Laterality: N/A;  . UPPER GASTROINTESTINAL ENDOSCOPY       Current Outpatient Medications  Medication Sig Dispense Refill  . albuterol (PROAIR HFA) 108 (90 Base) MCG/ACT inhaler Inhale 2 puffs into the lungs every 4 (four) hours as needed for wheezing or shortness of breath. 1 Inhaler 3  . albuterol  (PROVENTIL) (2.5 MG/3ML) 0.083% nebulizer solution Take 3 mLs (2.5 mg total) by nebulization every 6 (six) hours. 360 mL 12  . ALPRAZolam (XANAX) 0.5 MG tablet Take 0.5 mg by mouth at bedtime.     Marland Kitchen amLODipine (NORVASC) 5 MG tablet TAKE 1 TABLET BY MOUTH ONCE DAILY 90 tablet 3  . aspirin 81 MG tablet Take 243 mg by mouth at bedtime.     . Coenzyme Q10 (CO Q 10) 100 MG CAPS Take 100 mg by mouth at bedtime.     . colesevelam (WELCHOL) 625 MG tablet TAKE 3 TABLETS BY MOUTH TWICE DAILY WITH MEALS 540 tablet 3  . doxepin (SINEQUAN) 50 MG capsule Take 100 mg by mouth at bedtime.     Marland Kitchen esomeprazole (NEXIUM) 20 MG capsule Take 20 mg by mouth 2 (two) times daily.    . Fluticasone-Umeclidin-Vilant (TRELEGY ELLIPTA) 100-62.5-25 MCG/INH AEPB Inhale 1 puff into the lungs daily. 60 each 5  . furosemide (LASIX) 20 MG tablet Take 1 tablet (  20 mg total) by mouth daily as needed (ankle swelling). 30 tablet 3  . HUMALOG 100 UNIT/ML injection USE VIA PUMP DAILY. MAX DAILY DOSE 150 UNITS PER DAY.  6  . Iron-Vitamins (GERITOL COMPLETE PO) Take 1 tablet by mouth daily.    Marland Kitchen losartan (COZAAR) 50 MG tablet TAKE 1 TABLET BY MOUTH ONCE DAILY 90 tablet 3  . metFORMIN (GLUCOPHAGE) 500 MG tablet Take 1 tablet (500 mg total) by mouth 2 (two) times daily with a meal.    . metoprolol succinate (TOPROL-XL) 50 MG 24 hr tablet TAKE 1 TABLET BY MOUTH ONCE DAILY WITH  MEAL  OR  IMMEDIATELY  FOLLOWING  A  MEAL 90 tablet 3  . nitroGLYCERIN (NITROSTAT) 0.4 MG SL tablet Place 1 tablet (0.4 mg total) under the tongue every 5 (five) minutes as needed for chest pain. 25 tablet 3  . Omega-3 Fatty Acids (FISH OIL) 600 MG CAPS Take 600 mg by mouth 2 (two) times daily.     . pravastatin (PRAVACHOL) 40 MG tablet TAKE 1 TABLET BY MOUTH ONCE DAILY 90 tablet 3  . SPIRIVA HANDIHALER 18 MCG inhalation capsule INHALE THE CONTENTS OF 1 CAPSULE INTO THE LUNGS  ONCE DAILY 30 capsule 2  . SYMBICORT 160-4.5 MCG/ACT inhaler INHALE 2 PUFFS BY MOUTH EVERY 12  HOURS, RINSE MOUTH AFTER EVERY USE 1 Inhaler 2   Current Facility-Administered Medications  Medication Dose Route Frequency Provider Last Rate Last Dose  . 0.9 %  sodium chloride infusion  500 mL Intravenous Continuous Milus Banister, MD        Allergies:   Patient has no known allergies.    Social History:  The patient  reports that he quit smoking about 20 years ago. His smoking use included cigarettes. He has a 70.00 pack-year smoking history. He has never used smokeless tobacco. He reports current alcohol use. He reports that he does not use drugs.   Family History:  The patient's family history includes Drug abuse in his brother.    ROS:  General:no colds or fevers, no weight changes Skin:no rashes or ulcers HEENT:no blurred vision, no congestion CV:see HPI PUL:see HPI GI:no diarrhea constipation or melena, no indigestion GU:no hematuria, no dysuria MS:no joint pain, no claudication Neuro:no syncope, no lightheadedness Endo:+ diabetes, no thyroid disease  Wt Readings from Last 3 Encounters:  08/21/18 193 lb (87.5 kg)  07/31/18 194 lb 3.2 oz (88.1 kg)  07/16/18 191 lb 6.4 oz (86.8 kg)     PHYSICAL EXAM: VS:  BP 140/70   Pulse 80   Ht 5\' 10"  (1.778 m)   Wt 193 lb (87.5 kg)   SpO2 96%   BMI 27.69 kg/m  , BMI Body mass index is 27.69 kg/m. General:Pleasant affect, NAD Skin:Warm and dry, brisk capillary refill HEENT:normocephalic, sclera clear, mucus membranes moist Neck:supple, no JVD, no bruits  Heart:S1S2 RRR without murmur, gallup, rub or click Lungs:clear without rales, rhonchi, or wheezes SWF:UXNA, non tender, + BS, do not palpate liver spleen or masses Ext:no lower ext edema, 2+ pedal pulses, 2+ radial pulses Neuro:alert and oriented X 3, MAE, follows commands, + facial symmetry    EKG:  EKG is NOT ordered today.    Recent Labs: 01/29/2018: NT-Pro BNP 73; TSH 3.780 02/20/2018: BUN 12; Creatinine, Ser 1.20; Potassium 4.8; Sodium 139 07/31/2018:  Hemoglobin 12.9; Platelets 204.0    Lipid Panel    Component Value Date/Time   CHOL 127 06/05/2016 1055   TRIG 89 06/05/2016 1055  HDL 58 06/05/2016 1055   CHOLHDL 2.2 06/05/2016 1055   VLDL 18 06/05/2016 1055   LDLCALC 51 06/05/2016 1055       Other studies Reviewed: Additional studies/ records that were reviewed today include: . Cardiac catheterization 02/26/2018  Ost 1st Diag lesion is 75% stenosed. LIMA to diagonal is patent. Appears unchanged from prior cath.  Prox LAD lesion is 25% stenosed.  The left ventricular systolic function is normal.  LV end diastolic pressure is normal.  The left ventricular ejection fraction is 55-65% by visual estimate.  There is no aortic valve stenosis.  No change from prior cath.   Recommend Aspirin 81mg  daily for moderate CAD.   ASSESSMENT AND PLAN:  1.  CAD with a hx of CABG and stable disease on last cath 02/2018  2.  HTN stable   3.  3 episodes in several years of SOB that wakes him in night discussed it could be arrhythmia but it occurred Sept or Oct.  Wearing a monitor may not show anything at this point. No awareness of heart beat at the time.    4.  COPD per [name]   5.  HLD continue current meds he will have lipid check prior to appt with Dr. Marlou Porch in 6 months.   6.  DM-2 on insulin per PCP    Current medicines are reviewed with the patient today.  The patient Has no concerns regarding medicines.  The following changes have been made:  See above Labs/ tests ordered today include:see above  Disposition:   FU:  see above  Signed, Cecilie Kicks, NP  08/23/2018 10:43 PM    [facility] [place], [place], [place], [place] Phone: [phone]; Fax: [phone]

## 2018-08-23 ENCOUNTER — Encounter: Payer: Self-pay | Admitting: Cardiology

## 2018-08-26 ENCOUNTER — Encounter: Payer: Self-pay | Admitting: Adult Health

## 2018-08-26 ENCOUNTER — Ambulatory Visit (INDEPENDENT_AMBULATORY_CARE_PROVIDER_SITE_OTHER): Payer: Medicare Other | Admitting: Adult Health

## 2018-08-26 ENCOUNTER — Ambulatory Visit (INDEPENDENT_AMBULATORY_CARE_PROVIDER_SITE_OTHER)
Admission: RE | Admit: 2018-08-26 | Discharge: 2018-08-26 | Disposition: A | Discharge status: Home / Self Care | Payer: Medicare Other | Source: Ambulatory Visit | Attending: Adult Health | Admitting: Adult Health

## 2018-08-26 VITALS — BP 140/68 | HR 79 | Temp 98.4°F | Ht 70.0 in | Wt 188.2 lb

## 2018-08-26 DIAGNOSIS — J449 Chronic obstructive pulmonary disease, unspecified: Secondary | ICD-10-CM

## 2018-08-26 DIAGNOSIS — I2510 Atherosclerotic heart disease of native coronary artery without angina pectoris: Secondary | ICD-10-CM

## 2018-08-26 DIAGNOSIS — R0602 Shortness of breath: Secondary | ICD-10-CM | POA: Diagnosis not present

## 2018-08-26 DIAGNOSIS — R05 Cough: Secondary | ICD-10-CM | POA: Diagnosis not present

## 2018-08-26 MED ORDER — AZITHROMYCIN 250 MG PO TABS: ORAL_TABLET | ORAL | 0 refills | Status: AC

## 2018-08-26 NOTE — Progress Notes (Signed)
@Patient  ID: Oscar Robinson, male    DOB: [birth year], 73 y.o.   MRN: [id number]  Chief Complaint  Patient presents with  . Acute Visit    COPD     Referring provider: [name], MD  HPI: 73 year old male former smoker followed for severe COPD. Medical history significant for coronary artery disease status post CABG, GERD IDDM - Humalog pump   TEST/EVENTS :  Home sleep study August 2019- negative for significant sleep apnea  08/26/2018 Acute OV : Cough  Patient presents for an acute office visit.  Patient complains of 5 days of cough with thick lime green mucus. Has had no fever or body aches. Taking goody powders and otc cold meds, plus mucinex . Eating okay no n/v.d.  Symptoms seem to getting worse over the last couple days.  Wife has been sick with similar symptoms .  Feels that he got this from her Patient has underlying severe COPD and is on Symbicort and Spiriva..  Discussed avoiding Goody powders if possible  Has IDDM , on humalog pump.    No Known Allergies  Immunization History  Administered Date(s) Administered  . H1N1 07/09/2008  . Influenza Split 05/06/2012, 06/21/2015  . Influenza Whole 05/23/2010, 05/07/2011  . Influenza, High Dose Seasonal PF 05/23/2016, 04/14/2018  . Influenza,inj,Quad PF,6+ Mos 05/01/2013, 05/07/2014  . Influenza-Unspecified 04/01/2017  . Pneumococcal Conjugate-13 03/09/2014  . Pneumococcal Polysaccharide-23 05/23/2010, 06/23/2018  . Zoster 12/05/2014    Past Medical History:  Diagnosis Date  . Anemia   . Anxiety   . Arthritis   . CKD (chronic kidney disease), stage II   . COPD (chronic obstructive pulmonary disease) ([place])   . Coronary artery disease    a. s/p CABG 1999.  . Depression   . Diabetes mellitus   . Diverticulosis   . GERD (gastroesophageal reflux disease)   . Hiatal hernia   . Hypercholesteremia   . Hypertension   . Sleep apnea    had sleep study and negative per pt  . Thyroid disease   . Tobacco  abuse   . Tubular adenoma of colon     Tobacco History: Social History   Tobacco Use  Smoking Status Former Smoker  . Packs/day: 2.00  . Years: 35.00  . Pack years: 70.00  . Types: Cigarettes  . Last attempt to quit: 09/27/1997  . Years since quitting: 20.9  Smokeless Tobacco Never Used   Counseling given: Not Answered   Outpatient Medications Prior to Visit  Medication Sig Dispense Refill  . albuterol (PROAIR HFA) 108 (90 Base) MCG/ACT inhaler Inhale 2 puffs into the lungs every 4 (four) hours as needed for wheezing or shortness of breath. 1 Inhaler 3  . albuterol (PROVENTIL) (2.5 MG/3ML) 0.083% nebulizer solution Take 3 mLs (2.5 mg total) by nebulization every 6 (six) hours. 360 mL 12  . ALPRAZolam (XANAX) 0.5 MG tablet Take 0.5 mg by mouth at bedtime.     Marland Kitchen amLODipine (NORVASC) 5 MG tablet TAKE 1 TABLET BY MOUTH ONCE DAILY 90 tablet 3  . aspirin 81 MG tablet Take 243 mg by mouth at bedtime.     . Coenzyme Q10 (CO Q 10) 100 MG CAPS Take 100 mg by mouth at bedtime.     . colesevelam (WELCHOL) 625 MG tablet TAKE 3 TABLETS BY MOUTH TWICE DAILY WITH MEALS 540 tablet 3  . doxepin (SINEQUAN) 50 MG capsule Take 100 mg by mouth at bedtime.     Marland Kitchen esomeprazole (NEXIUM) 20 MG  capsule Take 20 mg by mouth 2 (two) times daily.    . Fluticasone-Umeclidin-Vilant (TRELEGY ELLIPTA) 100-62.5-25 MCG/INH AEPB Inhale 1 puff into the lungs daily. 60 each 5  . furosemide (LASIX) 20 MG tablet Take 1 tablet (20 mg total) by mouth daily as needed (ankle swelling). 30 tablet 3  . HUMALOG 100 UNIT/ML injection USE VIA PUMP DAILY. MAX DAILY DOSE 150 UNITS PER DAY.  6  . Iron-Vitamins (GERITOL COMPLETE PO) Take 1 tablet by mouth daily.    Marland Kitchen losartan (COZAAR) 50 MG tablet TAKE 1 TABLET BY MOUTH ONCE DAILY 90 tablet 3  . metFORMIN (GLUCOPHAGE) 500 MG tablet Take 1 tablet (500 mg total) by mouth 2 (two) times daily with a meal.    . metoprolol succinate (TOPROL-XL) 50 MG 24 hr tablet TAKE 1 TABLET BY MOUTH ONCE  DAILY WITH  MEAL  OR  IMMEDIATELY  FOLLOWING  A  MEAL 90 tablet 3  . nitroGLYCERIN (NITROSTAT) 0.4 MG SL tablet Place 1 tablet (0.4 mg total) under the tongue every 5 (five) minutes as needed for chest pain. 25 tablet 3  . Omega-3 Fatty Acids (FISH OIL) 600 MG CAPS Take 600 mg by mouth 2 (two) times daily.     . pravastatin (PRAVACHOL) 40 MG tablet TAKE 1 TABLET BY MOUTH ONCE DAILY 90 tablet 3  . SPIRIVA HANDIHALER 18 MCG inhalation capsule INHALE THE CONTENTS OF 1 CAPSULE INTO THE LUNGS  ONCE DAILY 30 capsule 2  . SYMBICORT 160-4.5 MCG/ACT inhaler INHALE 2 PUFFS BY MOUTH EVERY 12 HOURS, RINSE MOUTH AFTER EVERY USE 1 Inhaler 2   Facility-Administered Medications Prior to Visit  Medication Dose Route Frequency Provider Last Rate Last Dose  . 0.9 %  sodium chloride infusion  500 mL Intravenous Continuous Milus Banister, MD         Review of Systems:   Constitutional:   No  weight loss, night sweats,  Fevers, chills,  +fatigue, or  lassitude.  HEENT:   No headaches,  Difficulty swallowing,  Tooth/dental problems, or  Sore throat,                No sneezing, itching, ear ache, +nasal congestion, post nasal drip,   CV:  No chest pain,  Orthopnea, PND, swelling in lower extremities, anasarca, dizziness, palpitations, syncope.   GI  No heartburn, indigestion, abdominal pain, nausea, vomiting, diarrhea, change in bowel habits, loss of appetite, bloody stools.   Resp:   No chest wall deformity  Skin: no rash or lesions.  GU: no dysuria, change in color of urine, no urgency or frequency.  No flank pain, no hematuria   MS:  No joint pain or swelling.  No decreased range of motion.  No back pain.    Physical Exam  BP 140/68 (BP Location: Left Arm, Cuff Size: Normal)   Pulse 79   Temp 98.4 F (36.9 C) (Oral)   Ht 5\' 10"  (1.778 m)   Wt 188 lb 3.2 oz (85.4 kg)   SpO2 99%   BMI 27.00 kg/m   GEN: A/Ox3; pleasant , NAD, well nourished , elderly   HEENT:  [place]/AT,  EACs-clear, TMs-wnl,  NOSE-clear, THROAT-clear, no lesions, no postnasal drip or exudate noted.   NECK:  Supple w/ fair ROM; no JVD; normal carotid impulses w/o bruits; no thyromegaly or nodules palpated; no lymphadenopathy.    RESP few trace faint rhonchi  no accessory muscle use, no dullness to percussion  CARD:  RRR, no m/r/g, no peripheral  edema, pulses intact, no cyanosis or clubbing.  GI:   Soft & nt; nml bowel sounds; no organomegaly or masses detected.  Small soft umbilical hernia noted  Musco: Warm bil, no deformities or joint swelling noted.   Neuro: alert, no focal deficits noted.    Skin: Warm, no lesions or rashes Insulin pump   Lab Results:  CBC  BMET  No results found for: BNP   Imaging: No results found.    PFT Results Latest Ref Rng & Units 04/07/2014  FVC-Pre L 2.49  FVC-Predicted Pre % 57  FVC-Post L 2.84  FVC-Predicted Post % 65  Pre FEV1/FVC % % 51  Post FEV1/FCV % % 56  FEV1-Pre L 1.28  FEV1-Predicted Pre % 39  FEV1-Post L 1.60  DLCO UNC% % 47  DLCO COR %Predicted % 57  TLC L 6.10  TLC % Predicted % 89  RV % Predicted % 145    Lab Results  Component Value Date   NITRICOXIDE 6 05/09/2016        Assessment & Plan:   GOLD COPD B-C Mild exacerbation with bronchitis Check chest x-ray today  Plan  Patient Instructions  Zpack take as directed.  Mucinex DM Twice daily  As needed  Cough/congestion  Saline nasal rinses As needed   Tylenol As needed   Chest xray today  Continue on Symbicort and Spiriva  Albuterol inhaler or Neb As needed  Wheezing .  Follow up with [name]  In 2 weeks as planned and As needed   Please contact office for sooner follow up if symptoms do not improve or worsen or seek emergency care          Rexene Edison, NP 08/26/2018

## 2018-08-26 NOTE — Assessment & Plan Note (Signed)
Mild exacerbation with bronchitis Check chest x-ray today  Plan  Patient Instructions  Zpack take as directed.  Mucinex DM Twice daily  As needed  Cough/congestion  Saline nasal rinses As needed   Tylenol As needed   Chest xray today  Continue on Symbicort and Spiriva  Albuterol inhaler or Neb As needed  Wheezing .  Follow up with [name]  In 2 weeks as planned and As needed   Please contact office for sooner follow up if symptoms do not improve or worsen or seek emergency care

## 2018-08-26 NOTE — Patient Instructions (Addendum)
Zpack take as directed.  Mucinex DM Twice daily  As needed  Cough/congestion  Saline nasal rinses As needed   Tylenol As needed   Chest xray today  Continue on Symbicort and Spiriva  Albuterol inhaler or Neb As needed  Wheezing .  Follow up with [name]  In 2 weeks as planned and As needed   Please contact office for sooner follow up if symptoms do not improve or worsen or seek emergency care

## 2018-08-27 ENCOUNTER — Telehealth: Payer: Self-pay | Admitting: Emergency Medicine

## 2018-08-27 ENCOUNTER — Telehealth: Payer: Self-pay | Admitting: Adult Health

## 2018-08-27 NOTE — Telephone Encounter (Signed)
Called and spoke with patient.  Patient requested CXR results.  CXR results and recommendations given.  Understanding stated. Patient stated that he nebs were low and the mail order pharmacy called and told him they were sending fax to [facility] for new prescription.  Patient did not know which pharmacy he used for nebs.  Named off pharmacies listed in epic.  He was unsure.  Stated that he would call back with mail order pharmacy.

## 2018-08-27 NOTE — Telephone Encounter (Signed)
See previous message

## 2018-08-28 ENCOUNTER — Ambulatory Visit: Payer: Medicare Other | Admitting: Emergency Medicine

## 2018-09-03 ENCOUNTER — Telehealth: Payer: Self-pay | Admitting: Emergency Medicine

## 2018-09-03 MED ORDER — ALBUTEROL SULFATE (2.5 MG/3ML) 0.083% IN NEBU: 2.5000 mg | INHALATION_SOLUTION | Freq: Four times a day (QID) | RESPIRATORY_TRACT | 12 refills | Status: DC

## 2018-09-03 NOTE — Telephone Encounter (Signed)
Call made to patient, requesting refill of albuterol be sent to [facility]. Phone [phone], Fax [phone]. Order printed and signed by E. [name], NP. Faxed to [facility]. Patient made aware order has been sent. Nothing further is needed at this time.

## 2018-09-10 ENCOUNTER — Encounter: Payer: Self-pay | Admitting: Nurse Practitioner

## 2018-09-10 ENCOUNTER — Ambulatory Visit (INDEPENDENT_AMBULATORY_CARE_PROVIDER_SITE_OTHER): Payer: Medicare Other | Admitting: Nurse Practitioner

## 2018-09-10 DIAGNOSIS — I2510 Atherosclerotic heart disease of native coronary artery without angina pectoris: Secondary | ICD-10-CM

## 2018-09-10 DIAGNOSIS — J449 Chronic obstructive pulmonary disease, unspecified: Secondary | ICD-10-CM

## 2018-09-10 NOTE — Progress Notes (Signed)
@Patient  ID: Oscar Robinson, male    DOB: 1946-03-05, 73 y.o.   MRN: [id number]  Chief Complaint  Patient presents with  . Follow-up    States his breathing is doing much better since his last visit.     Referring provider: [name], MD  HPI 73 year old male former smoker followed for severe COPD. Medical history significant for coronary artery disease status post CABG, GERD IDDM - Humalog pump   TEST/EVENTS :  Home sleep study August 2019- negative for significant sleep apnea  PFT: PFT Results Latest Ref Rng & Units 04/07/2014  FVC-Pre L 2.49  FVC-Predicted Pre % 57  FVC-Post L 2.84  FVC-Predicted Post % 65  Pre FEV1/FVC % % 51  Post FEV1/FCV % % 56  FEV1-Pre L 1.28  FEV1-Predicted Pre % 39  FEV1-Post L 1.60  DLCO UNC% % 47  DLCO COR %Predicted % 57  TLC L 6.10  TLC % Predicted % 89  RV % Predicted % 145   OV 09/10/18 - Follow up Patient presents today for follow-up visit.  He was last seen by Tammy on 07/31/2018.  He was prescribed a Z-Pak to take Mucinex.  He states that he feels much better.  Is compliant with Symbicort and Spiriva.  He takes albuterol when needed.  He states that his cough and chest congestion have cleared.  No recent fever.  Denies any recent shortness of breath, chest pain, or edema.  His vital signs in the office today are stable.  His O2 sats are at 95% on room air.  No Known Allergies  Immunization History  Administered Date(s) Administered  . H1N1 07/09/2008  . Influenza Split 05/06/2012, 06/21/2015  . Influenza Whole 05/23/2010, 05/07/2011  . Influenza, High Dose Seasonal PF 05/23/2016, 04/14/2018  . Influenza,inj,Quad PF,6+ Mos 05/01/2013, 05/07/2014  . Influenza-Unspecified 04/01/2017  . Pneumococcal Conjugate-13 03/09/2014  . Pneumococcal Polysaccharide-23 05/23/2010, 06/23/2018  . Zoster 12/05/2014    Past Medical History:  Diagnosis Date  . Anemia   . Anxiety   . Arthritis   . CKD (chronic kidney disease), stage II    . COPD (chronic obstructive pulmonary disease) ([place])   . Coronary artery disease    a. s/p CABG 1999.  . Depression   . Diabetes mellitus   . Diverticulosis   . GERD (gastroesophageal reflux disease)   . Hiatal hernia   . Hypercholesteremia   . Hypertension   . Sleep apnea    had sleep study and negative per pt  . Thyroid disease   . Tobacco abuse   . Tubular adenoma of colon     Tobacco History: Social History   Tobacco Use  Smoking Status Former Smoker  . Packs/day: 2.00  . Years: 35.00  . Pack years: 70.00  . Types: Cigarettes  . Last attempt to quit: 09/27/1997  . Years since quitting: 20.9  Smokeless Tobacco Never Used   Counseling given: Yes   Outpatient Encounter Medications as of 09/10/2018  Medication Sig  . albuterol (PROAIR HFA) 108 (90 Base) MCG/ACT inhaler Inhale 2 puffs into the lungs every 4 (four) hours as needed for wheezing or shortness of breath.  Marland Kitchen albuterol (PROVENTIL) (2.5 MG/3ML) 0.083% nebulizer solution Take 3 mLs (2.5 mg total) by nebulization every 6 (six) hours.  . ALPRAZolam (XANAX) 0.5 MG tablet Take 0.5 mg by mouth at bedtime.   Marland Kitchen amLODipine (NORVASC) 5 MG tablet TAKE 1 TABLET BY MOUTH ONCE DAILY  . aspirin 81 MG tablet Take  243 mg by mouth at bedtime.   . Coenzyme Q10 (CO Q 10) 100 MG CAPS Take 100 mg by mouth at bedtime.   . colesevelam (WELCHOL) 625 MG tablet TAKE 3 TABLETS BY MOUTH TWICE DAILY WITH MEALS  . doxepin (SINEQUAN) 50 MG capsule Take 100 mg by mouth at bedtime.   Marland Kitchen esomeprazole (NEXIUM) 20 MG capsule Take 20 mg by mouth 2 (two) times daily.  . furosemide (LASIX) 20 MG tablet Take 1 tablet (20 mg total) by mouth daily as needed (ankle swelling).  Marland Kitchen HUMALOG 100 UNIT/ML injection USE VIA PUMP DAILY. MAX DAILY DOSE 150 UNITS PER DAY.  . Iron-Vitamins (GERITOL COMPLETE PO) Take 1 tablet by mouth daily.  Marland Kitchen losartan (COZAAR) 50 MG tablet TAKE 1 TABLET BY MOUTH ONCE DAILY  . metFORMIN (GLUCOPHAGE) 500 MG tablet Take 1 tablet (500 mg  total) by mouth 2 (two) times daily with a meal.  . metoprolol succinate (TOPROL-XL) 50 MG 24 hr tablet TAKE 1 TABLET BY MOUTH ONCE DAILY WITH  MEAL  OR  IMMEDIATELY  FOLLOWING  A  MEAL  . nitroGLYCERIN (NITROSTAT) 0.4 MG SL tablet Place 1 tablet (0.4 mg total) under the tongue every 5 (five) minutes as needed for chest pain.  . Omega-3 Fatty Acids (FISH OIL) 600 MG CAPS Take 600 mg by mouth 2 (two) times daily.   . pravastatin (PRAVACHOL) 40 MG tablet TAKE 1 TABLET BY MOUTH ONCE DAILY  . SPIRIVA HANDIHALER 18 MCG inhalation capsule INHALE THE CONTENTS OF 1 CAPSULE INTO THE LUNGS  ONCE DAILY  . SYMBICORT 160-4.5 MCG/ACT inhaler INHALE 2 PUFFS BY MOUTH EVERY 12 HOURS, RINSE MOUTH AFTER EVERY USE  . [DISCONTINUED] Fluticasone-Umeclidin-Vilant (TRELEGY ELLIPTA) 100-62.5-25 MCG/INH AEPB Inhale 1 puff into the lungs daily.   Facility-Administered Encounter Medications as of 09/10/2018  Medication  . 0.9 %  sodium chloride infusion     Review of Systems  Review of Systems  Constitutional: Negative.  Negative for chills and fever.  HENT: Negative.  Negative for congestion.   Respiratory: Negative for cough, shortness of breath and wheezing.   Cardiovascular: Negative.  Negative for chest pain, palpitations and leg swelling.  Gastrointestinal: Negative.   Allergic/Immunologic: Negative.   Neurological: Negative.   Psychiatric/Behavioral: Negative.        Physical Exam  BP 120/68   Pulse 78   Ht 5\' 10"  (1.778 m)   Wt 189 lb (85.7 kg)   SpO2 95%   BMI 27.12 kg/m   Wt Readings from Last 5 Encounters:  09/10/18 189 lb (85.7 kg)  08/26/18 188 lb 3.2 oz (85.4 kg)  08/21/18 193 lb (87.5 kg)  07/31/18 194 lb 3.2 oz (88.1 kg)  07/16/18 191 lb 6.4 oz (86.8 kg)     Physical Exam Vitals signs and nursing note reviewed.  Constitutional:      General: He is not in acute distress.    Appearance: He is well-developed.  Cardiovascular:     Rate and Rhythm: Normal rate and regular rhythm.    Pulmonary:     Effort: Pulmonary effort is normal. No respiratory distress.     Breath sounds: Normal breath sounds. No wheezing or rhonchi.  Musculoskeletal:        General: No swelling.  Skin:    General: Skin is warm and dry.  Neurological:     Mental Status: He is alert and oriented to person, place, and time.      Imaging: Dg Chest 2 View  Result Date:  08/26/2018 CLINICAL DATA:  Shortness of breath and productive cough for 1 week EXAM: CHEST - 2 VIEW COMPARISON:  01/28/2018 FINDINGS: Cardiac shadow is stable. Postsurgical changes are again seen. No focal infiltrate or sizable effusion is seen. Hyperinflation is noted consistent with COPD. No bony abnormality is noted. IMPRESSION: COPD without acute abnormality. Electronically Signed   By: Inez Catalina M.D.   On: 08/26/2018 17:05     Assessment & Plan:   GOLD COPD B-C Patient is doing well he is over his recent exacerbation.  Patient Instructions  Mucinex DM Twice daily  As needed  Cough/congestion  Saline nasal rinses As needed   May use flonase Tylenol  as needed   Continue on Symbicort and Spiriva  Albuterol inhaler or Neb As needed  Wheezing .  Follow up with [name]  in 3 months Please contact office for sooner follow up if symptoms do not improve or worsen or seek emergency care        Fenton Foy, NP 09/10/2018

## 2018-09-10 NOTE — Assessment & Plan Note (Signed)
Patient is doing well he is over his recent exacerbation.  Patient Instructions  Mucinex DM Twice daily  As needed  Cough/congestion  Saline nasal rinses As needed   May use flonase Tylenol  as needed   Continue on Symbicort and Spiriva  Albuterol inhaler or Neb As needed  Wheezing .  Follow up with [name]  in 3 months Please contact office for sooner follow up if symptoms do not improve or worsen or seek emergency care

## 2018-09-10 NOTE — Patient Instructions (Signed)
Mucinex DM Twice daily  As needed  Cough/congestion  Saline nasal rinses As needed   May use flonase Tylenol as needed   Continue on Symbicort and Spiriva  Albuterol inhaler or Neb As needed  Wheezing .  Follow up with [name] in 3 months Please contact office for sooner follow up if symptoms do not improve or worsen or seek emergency care

## 2018-09-11 DIAGNOSIS — E7800 Pure hypercholesterolemia, unspecified: Secondary | ICD-10-CM | POA: Diagnosis not present

## 2018-09-11 DIAGNOSIS — E119 Type 2 diabetes mellitus without complications: Secondary | ICD-10-CM | POA: Diagnosis not present

## 2018-09-11 DIAGNOSIS — I10 Essential (primary) hypertension: Secondary | ICD-10-CM | POA: Diagnosis not present

## 2018-09-11 DIAGNOSIS — R234 Changes in skin texture: Secondary | ICD-10-CM | POA: Diagnosis not present

## 2018-09-11 DIAGNOSIS — Z9641 Presence of insulin pump (external) (internal): Secondary | ICD-10-CM | POA: Diagnosis not present

## 2018-09-11 DIAGNOSIS — E049 Nontoxic goiter, unspecified: Secondary | ICD-10-CM | POA: Diagnosis not present

## 2018-09-11 DIAGNOSIS — E1165 Type 2 diabetes mellitus with hyperglycemia: Secondary | ICD-10-CM | POA: Diagnosis not present

## 2018-09-11 DIAGNOSIS — E02 Subclinical iodine-deficiency hypothyroidism: Secondary | ICD-10-CM | POA: Diagnosis not present

## 2018-09-18 DIAGNOSIS — E119 Type 2 diabetes mellitus without complications: Secondary | ICD-10-CM | POA: Diagnosis not present

## 2018-09-18 DIAGNOSIS — R234 Changes in skin texture: Secondary | ICD-10-CM | POA: Diagnosis not present

## 2018-09-19 ENCOUNTER — Telehealth: Payer: Self-pay | Admitting: Emergency Medicine

## 2018-09-19 MED ORDER — ALBUTEROL SULFATE (2.5 MG/3ML) 0.083% IN NEBU: 2.5000 mg | INHALATION_SOLUTION | Freq: Four times a day (QID) | RESPIRATORY_TRACT | 12 refills | Status: DC | PRN

## 2018-09-19 NOTE — Telephone Encounter (Signed)
Called and spoke with patient regarding proventil neb meds sent to wrong pharmacy Advised pt that the script for neb meds went to [place] suppose to go to [facility] spoke with Anderson Malta; she shows the order has been cancelled.  Pl[facility] order to [facility] today for albuterol 2.5mg  nebulizer solution Oscar Robinson with [facility] letting her aware this was rec'd, she said yes nothing further needed Called and spoke with patient that the medication is at the correct pharmacy Nothing further needed

## 2018-09-25 ENCOUNTER — Telehealth: Payer: Self-pay | Admitting: Emergency Medicine

## 2018-09-25 MED ORDER — AZITHROMYCIN 250 MG PO TABS: ORAL_TABLET | ORAL | 0 refills | Status: DC

## 2018-09-25 NOTE — Telephone Encounter (Signed)
Patient wife has bronchitis and he is scared  that he is going to get sick and they are going on a cruise and he will become ill.   Toyna please advise if you are okay with sending abx/predisone for patient.

## 2018-09-25 NOTE — Telephone Encounter (Signed)
Yes. I will send him in a prescription for azithromycin to have incase he starts to have cough and chest congestion.

## 2018-09-25 NOTE — Telephone Encounter (Signed)
Called and spoke with Patient.  Tonya, NP recommendations given.  Understanding stated.  Nothing further at this time. 

## 2018-10-16 ENCOUNTER — Telehealth: Payer: Self-pay | Admitting: Emergency Medicine

## 2018-10-17 NOTE — Telephone Encounter (Signed)
09/10/2018 note faxed to [facility] to the number given

## 2018-10-20 ENCOUNTER — Telehealth: Payer: Self-pay | Admitting: Emergency Medicine

## 2018-10-20 NOTE — Telephone Encounter (Signed)
LVM for Aisha at [facility] at [phone] regarding refill rx for Albuterol Inhaler. X1

## 2018-10-21 NOTE — Telephone Encounter (Signed)
[place], [phone], spoke with Debbie.  Debbie stated albuterol neb  refill was received and processed.  Nothing further is needed.

## 2018-10-27 ENCOUNTER — Telehealth: Payer: Self-pay | Admitting: Emergency Medicine

## 2018-10-27 MED ORDER — DOXYCYCLINE HYCLATE 100 MG PO TABS: 100.0000 mg | ORAL_TABLET | Freq: Two times a day (BID) | ORAL | 0 refills | Status: DC

## 2018-10-27 MED ORDER — PREDNISONE 10 MG PO TABS: ORAL_TABLET | ORAL | 0 refills | Status: DC

## 2018-10-27 NOTE — Telephone Encounter (Signed)
Spoke with pt..  Pt stated he had chest congestion with occasional green mucus.  No fever or sore throat.  Is taking mucinex.  Had symptoms for 5 days with today being the worst.  Did use nebulizer today. He has SOB but always SOB. Please advise.

## 2018-10-27 NOTE — Telephone Encounter (Signed)
Sent in RX doxy and prednisone taper.

## 2018-10-27 NOTE — Telephone Encounter (Signed)
Spoke with pt.  Advised him of beth's ecommendations.  Pt stated his blood sugar run high on prednisone.  Beth stated he did not have to take prednisone but take the antibiotic.  If he does not feel better in 3 to 4 days please call back.  Nothing further needed.

## 2018-11-10 ENCOUNTER — Telehealth: Payer: Self-pay | Admitting: Emergency Medicine

## 2018-11-10 MED ORDER — BUDESONIDE-FORMOTEROL FUMARATE 160-4.5 MCG/ACT IN AERO: INHALATION_SPRAY | RESPIRATORY_TRACT | 2 refills | Status: DC

## 2018-11-10 NOTE — Telephone Encounter (Signed)
rx sent  Pt aware.

## 2018-12-09 ENCOUNTER — Ambulatory Visit: Payer: Medicare Other | Admitting: Adult Health

## 2018-12-09 ENCOUNTER — Ambulatory Visit: Payer: Medicare Other | Admitting: Nurse Practitioner

## 2018-12-09 ENCOUNTER — Ambulatory Visit: Payer: Medicare Other | Admitting: Emergency Medicine

## 2018-12-12 ENCOUNTER — Other Ambulatory Visit: Payer: Self-pay

## 2018-12-12 ENCOUNTER — Encounter: Payer: Self-pay | Admitting: Adult Health

## 2018-12-12 ENCOUNTER — Ambulatory Visit (INDEPENDENT_AMBULATORY_CARE_PROVIDER_SITE_OTHER): Payer: Medicare Other | Admitting: Adult Health

## 2018-12-12 DIAGNOSIS — J449 Chronic obstructive pulmonary disease, unspecified: Secondary | ICD-10-CM

## 2018-12-12 NOTE — Progress Notes (Signed)
Virtual Visit via Telephone Note  I connected with Oscar Robinson on 12/12/18 at  2:30 PM EDT by telephone and verified that I am speaking with the correct person using two identifiers.  Location: Patient: Home  Provider: Office    I discussed the limitations, risks, security and privacy concerns of performing an evaluation and management service by telephone and the availability of in person appointments. I also discussed with the patient that there may be a patient responsible charge related to this service. The patient expressed understanding and agreed to proceed.   History of Present Illness: 73 year old male former smoker followed for severe COPD Medical history significant for coronary artery disease status post CABG, GERD and insulin-dependent diabetes on Humalog insulin pump   Today tele-visit is a 60-month  follow-up for COPD.  Patient says overall breathing is doing about the same. Still gets short of breath with activity . Activity level is at baseline .  Denies flare of cough or dyspnea. Trying to stay active at home. Is being very careful with COVID 19 precuations.  Says he is staying home, practicing social distancing. Wears a mask when has to go out.  Remains on Symbicort 2 puffs twice daily.  And Spiriva daily Chest x-ray January 2020 showed COPD changes without acute process.   Has IDDM , says under control .   Observations/Objective: Home sleep study August 2019- for significant sleep apnea Echo June 2019 EF 55 to 60%, pulmonary artery pressure 23 mmHg  04/07/2014-pulmonary function test- ratio 51, FEV1 39, significant bronchodilator response, DLCO 47, concavity and flow volume loops 03/03/2018-spirometry- ratio 59, FEV1 37, severe obstruction   Assessment and Plan: COPD-currently stable   Plan  Patient Instructions  Continue on Symbicort and Spiriva  Albuterol inhaler or Neb As needed  Wheezing .  Follow up with [name]  In 3-4 months and As needed   Please  contact office for sooner follow up if symptoms do not improve or worsen or seek emergency care       Follow Up Instructions: Follow up with [name]  In 3 months and As needed      I discussed the assessment and treatment plan with the patient. The patient was provided an opportunity to ask questions and all were answered. The patient agreed with the plan and demonstrated an understanding of the instructions.   The patient was advised to call back or seek an in-person evaluation if the symptoms worsen or if the condition fails to improve as anticipated.  I provided 22  minutes of non-face-to-face time during this encounter.   Rexene Edison, NP

## 2018-12-12 NOTE — Patient Instructions (Signed)
Continue on Symbicort and Spiriva  Albuterol inhaler or Neb As needed  Wheezing .  Follow up with [name]  In 3-4 months and As needed   Please contact office for sooner follow up if symptoms do not improve or worsen or seek emergency care

## 2018-12-31 NOTE — Telephone Encounter (Signed)
Lets go ahead and have him keep his June appointment and we can do it virtually.  Since he would like to have his lab work done, lets have him come in and get a complete metabolic profile and a CBC as well as lipid panel.  He can come in this week to get that.  Candee Furbish, MD

## 2019-01-02 ENCOUNTER — Telehealth: Payer: Self-pay | Admitting: Cardiology

## 2019-01-02 NOTE — Telephone Encounter (Signed)
New Message   Patient has virtual visit scheduled for 01/14/19 and would like someone to call them to help setup for appt.

## 2019-01-02 NOTE — Telephone Encounter (Signed)
Will route this message to Dr Marlou Porch CMA for further assistance with setting the pts virtual visit up, scheduled for 6/10, and obtain consent.

## 2019-01-05 ENCOUNTER — Telehealth: Payer: Self-pay | Admitting: *Deleted

## 2019-01-05 NOTE — Telephone Encounter (Signed)
    COVID-19 Pre-Screening Questions:  . In the past 7 to 10 days have you had a cough,  shortness of breath, headache, congestion, fever (100 or greater) body aches, chills, sore throat, or sudden loss of taste or sense of smell? . Have you been around anyone with known Covid 19. . Have you been around anyone who is awaiting Covid 19 test results in the past 7 to 10 days? . Have you been around anyone who has been exposed to Covid 19, or has mentioned symptoms of Covid 19 within the past 7 to 10 days?  If you have any concerns/questions about symptoms patients report during screening (either on the phone or at [facility]). Contact the provider seeing the patient or DOD for further guidance.  If neither are available contact a member of the leadership team.       Called patient via telephone. All no's to the Covid 19 questionaire. Has his own mask.  KB

## 2019-01-06 ENCOUNTER — Other Ambulatory Visit: Payer: Medicare Other

## 2019-01-06 ENCOUNTER — Other Ambulatory Visit: Payer: Self-pay

## 2019-01-06 DIAGNOSIS — Z9641 Presence of insulin pump (external) (internal): Secondary | ICD-10-CM | POA: Diagnosis not present

## 2019-01-06 DIAGNOSIS — E02 Subclinical iodine-deficiency hypothyroidism: Secondary | ICD-10-CM | POA: Diagnosis not present

## 2019-01-06 DIAGNOSIS — I2510 Atherosclerotic heart disease of native coronary artery without angina pectoris: Secondary | ICD-10-CM

## 2019-01-06 DIAGNOSIS — I10 Essential (primary) hypertension: Secondary | ICD-10-CM | POA: Diagnosis not present

## 2019-01-06 DIAGNOSIS — E1165 Type 2 diabetes mellitus with hyperglycemia: Secondary | ICD-10-CM | POA: Diagnosis not present

## 2019-01-06 DIAGNOSIS — E7800 Pure hypercholesterolemia, unspecified: Secondary | ICD-10-CM | POA: Diagnosis not present

## 2019-01-06 DIAGNOSIS — E049 Nontoxic goiter, unspecified: Secondary | ICD-10-CM | POA: Diagnosis not present

## 2019-01-07 ENCOUNTER — Other Ambulatory Visit: Payer: Medicare Other

## 2019-01-07 ENCOUNTER — Encounter: Payer: Self-pay | Admitting: Internal Medicine

## 2019-01-07 LAB — HEPATIC FUNCTION PANEL: Bilirubin, Direct: 0.13 mg/dL (ref 0.00–0.40)

## 2019-01-07 LAB — COMPREHENSIVE METABOLIC PANEL
ALT: 17 IU/L (ref 0–44)
AST: 19 IU/L (ref 0–40)
Albumin/Globulin Ratio: 2 (ref 1.2–2.2)
Albumin: 4.3 g/dL (ref 3.7–4.7)
Alkaline Phosphatase: 74 IU/L (ref 39–117)
BUN/Creatinine Ratio: 13 (ref 10–24)
BUN: 15 mg/dL (ref 8–27)
Bilirubin Total: 0.4 mg/dL (ref 0.0–1.2)
CO2: 23 mmol/L (ref 20–29)
Calcium: 9.2 mg/dL (ref 8.6–10.2)
Chloride: 100 mmol/L (ref 96–106)
Creatinine, Ser: 1.16 mg/dL (ref 0.76–1.27)
GFR calc Af Amer: 73 mL/min/{1.73_m2} (ref >59)
GFR calc non Af Amer: 63 mL/min/{1.73_m2} (ref >59)
Globulin, Total: 2.2 g/dL (ref 1.5–4.5)
Glucose: 204 mg/dL — ABNORMAL HIGH (ref 65–99)
Potassium: 4.8 mmol/L (ref 3.5–5.2)
Sodium: 138 mmol/L (ref 134–144)
Total Protein: 6.5 g/dL (ref 6.0–8.5)

## 2019-01-09 ENCOUNTER — Telehealth: Payer: Self-pay

## 2019-01-09 NOTE — Telephone Encounter (Signed)

## 2019-01-14 ENCOUNTER — Telehealth (INDEPENDENT_AMBULATORY_CARE_PROVIDER_SITE_OTHER): Payer: Medicare Other | Admitting: Cardiology

## 2019-01-14 ENCOUNTER — Encounter: Payer: Self-pay | Admitting: Cardiology

## 2019-01-14 ENCOUNTER — Other Ambulatory Visit: Payer: Self-pay

## 2019-01-14 VITALS — Ht 70.0 in | Wt 190.0 lb

## 2019-01-14 DIAGNOSIS — I2510 Atherosclerotic heart disease of native coronary artery without angina pectoris: Secondary | ICD-10-CM

## 2019-01-14 DIAGNOSIS — I10 Essential (primary) hypertension: Secondary | ICD-10-CM

## 2019-01-14 DIAGNOSIS — Z794 Long term (current) use of insulin: Secondary | ICD-10-CM

## 2019-01-14 DIAGNOSIS — E119 Type 2 diabetes mellitus without complications: Secondary | ICD-10-CM

## 2019-01-14 DIAGNOSIS — E785 Hyperlipidemia, unspecified: Secondary | ICD-10-CM

## 2019-01-14 NOTE — Progress Notes (Signed)
Virtual Visit via Telephone Note   This visit type was conducted due to national recommendations for restrictions regarding the COVID-19 Pandemic (e.g. social distancing) in an effort to limit this patient's exposure and mitigate transmission in our community.  Due to his co-morbid illnesses, this patient is at least at moderate risk for complications without adequate follow up.  This format is felt to be most appropriate for this patient at this time.  The patient did not have access to video technology/had technical difficulties with video requiring transitioning to audio format only (telephone).  All issues noted in this document were discussed and addressed.  No physical exam could be performed with this format.  Please refer to the patient's chart for his  consent to telehealth for [facility].   Date:  01/14/2019   ID:  Oscar Robinson, DOB 02-06-1946, MRN [id number]  Patient Location: Home Provider Location: Home  PCP:  [name], MD  Cardiologist:  Candee Furbish, MD  Electrophysiologist:  None   Evaluation Performed:  Follow-Up Visit  Chief Complaint:  CAD follow up.  History of Present Illness:    Oscar Robinson is a 73 y.o. male with coronary artery disease CABG 1999 COPD [name] here for follow-up.  Woke up, urinated, could not get breath. 3 times in 6 years.  We talked about this.  Denies any chest pain fevers chills nausea vomiting syncope bleeding  Compliant with his medications.  The patient does not have symptoms concerning for COVID-19 infection (fever, chills, cough, or new shortness of breath).    Past Medical History:  Diagnosis Date  . Anemia   . Anxiety   . Arthritis   . CKD (chronic kidney disease), stage II   . COPD (chronic obstructive pulmonary disease) ([place])   . Coronary artery disease    a. s/p CABG 1999.  . Depression   . Diabetes mellitus   . Diverticulosis   . GERD (gastroesophageal reflux disease)   . Hiatal hernia   .  Hypercholesteremia   . Hypertension   . Sleep apnea    had sleep study and negative per pt  . Thyroid disease   . Tobacco abuse   . Tubular adenoma of colon    Past Surgical History:  Procedure Laterality Date  . COLONOSCOPY    . CORONARY ARTERY BYPASS GRAFT  1999  . 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 HAND SURGERY     lt palm,ring finger,  . HAND SURGERY Left    pinky finger  . HERNIA REPAIR  1996   lt/rt ing hernia  . LEFT HEART CATH AND CORS/GRAFTS ANGIOGRAPHY N/A 02/26/2018   Procedure: LEFT HEART CATH AND CORS/GRAFTS ANGIOGRAPHY;  Surgeon: Jettie Booze, MD;  Location: [place] [facility];  Service: Cardiovascular;  Laterality: N/A;  . LEFT HEART CATHETERIZATION WITH CORONARY/GRAFT ANGIOGRAM N/A 06/15/2013   Procedure: LEFT HEART CATHETERIZATION WITH Beatrix Fetters;  Surgeon: Candee Furbish, MD;  Location[facility] C[facility];  Service: Cardiovascular;  Laterality: N/A;  . UPPER GASTROINTESTINAL ENDOSCOPY       Current Meds  Medication Sig  . albuterol (PROAIR HFA) 108 (90 Base) MCG/ACT inhaler Inhale 2 puffs into the lungs every 4 (four) hours as needed for wheezing or shortness of breath.  Marland Kitchen albuterol (PROVENTIL) (2.5  MG/3ML) 0.083% nebulizer solution Take 3 mLs (2.5 mg total) by nebulization every 6 (six) hours as needed for wheezing or shortness of breath.  . ALPRAZolam (XANAX) 0.5 MG tablet Take 0.5 mg by mouth at bedtime.   Marland Kitchen amLODipine (NORVASC) 5 MG tablet TAKE 1 TABLET BY MOUTH ONCE DAILY  . Ascorbic Acid (VITAMIN C) 1000 MG tablet Take 1,000 mg by mouth daily.  Marland Kitchen aspirin 81 MG tablet Take 243 mg by mouth at bedtime.   . budesonide-formoterol (SYMBICORT) 160-4.5 MCG/ACT inhaler INHALE 2 PUFFS BY MOUTH EVERY 12 HOURS, RINSE MOUTH AFTER EVERY  USE  . Cholecalciferol (VITAMIN D3) 50 MCG (2000 UT) TABS Take 1 tablet by mouth daily.  . Coenzyme Q10 (CO Q 10) 100 MG CAPS Take 100 mg by mouth at bedtime.   . colesevelam (WELCHOL) 625 MG tablet TAKE 3 TABLETS BY MOUTH TWICE DAILY WITH MEALS  . doxepin (SINEQUAN) 50 MG capsule Take 100 mg by mouth at bedtime.   Marland Kitchen esomeprazole (NEXIUM) 20 MG capsule Take 20 mg by mouth 2 (two) times daily.  . furosemide (LASIX) 20 MG tablet Take 1 tablet (20 mg total) by mouth daily as needed (ankle swelling).  Marland Kitchen HUMALOG 100 UNIT/ML injection USE VIA PUMP DAILY. MAX DAILY DOSE 150 UNITS PER DAY.  Marland Kitchen losartan (COZAAR) 50 MG tablet TAKE 1 TABLET BY MOUTH ONCE DAILY  . metFORMIN (GLUCOPHAGE) 500 MG tablet Take 1 tablet (500 mg total) by mouth 2 (two) times daily with a meal.  . metoprolol succinate (TOPROL-XL) 50 MG 24 hr tablet TAKE 1 TABLET BY MOUTH ONCE DAILY WITH  MEAL  OR  IMMEDIATELY  FOLLOWING  A  MEAL  . Multiple Vitamins-Minerals (CENTRUM SILVER 50+MEN) TABS Take 1 tablet by mouth daily.  . nitroGLYCERIN (NITROSTAT) 0.4 MG SL tablet Place 1 tablet (0.4 mg total) under the tongue every 5 (five) minutes as needed for chest pain.  . Omega-3 Fatty Acids (FISH OIL) 600 MG CAPS Take 1,400 mg by mouth at bedtime.   . pravastatin (PRAVACHOL) 40 MG tablet TAKE 1 TABLET BY MOUTH ONCE DAILY  . SPIRIVA HANDIHALER 18 MCG inhalation capsule INHALE THE CONTENTS OF 1 CAPSULE INTO THE LUNGS  ONCE DAILY   Current Facility-Administered Medications for the 01/14/19 encounter (Telemedicine) with Jerline Pain, MD  Medication  . 0.9 %  sodium chloride infusion     Allergies:   Patient has no known allergies.   Social History   Tobacco Use  . Smoking status: Former Smoker    Packs/day: 2.00    Years: 35.00    Pack years: 70.00    Types: Cigarettes    Last attempt to quit: 09/27/1997    Years since quitting: 21.3  . Smokeless tobacco: Never Used  Substance Use Topics  . Alcohol use: Yes    Comment: occ  . Drug  use: No     Family Hx: The patient's family history includes Drug abuse in his brother. There is no history of Heart disease, Heart failure, Diabetes, Hypertension, Colon cancer, Esophageal cancer, Prostate cancer, Pancreatic cancer, Kidney disease, Liver disease, Stomach cancer, or Rectal cancer.  ROS:   Please see the history of present illness.     All other systems reviewed and are negative.   Prior CV studies:   The following studies were reviewed today:  Cardiac catheterization 02/26/2018  Ost 1st Diag lesion is 75% stenosed. LIMA to diagonal is patent. Appears unchanged from prior cath.  Prox LAD lesion is 25% stenosed.  The left ventricular systolic function is normal.  LV end diastolic pressure is normal.  The left ventricular ejection fraction is 55-65% by visual estimate.  There is no aortic valve stenosis.  No change from prior cath.   Recommend Aspirin 81mg  daily for moderate CAD.  Labs/Other Tests and Data Reviewed:    EKG:  An ECG dated 01/28/18 was personally reviewed today and demonstrated:  NSR, NSSTW changed.   Recent Labs: 01/29/2018: NT-Pro BNP 73; TSH 3.780 07/31/2018: Hemoglobin 12.9; Platelets 204.0 01/06/2019: ALT 17; BUN 15; Creatinine, Ser 1.16; Potassium 4.8; Sodium 138   Recent Lipid Panel Lab Results  Component Value Date/Time   CHOL 127 06/05/2016 10:55 AM   TRIG 89 06/05/2016 10:55 AM   HDL 58 06/05/2016 10:55 AM   CHOLHDL 2.2 06/05/2016 10:55 AM   LDLCALC 51 06/05/2016 10:55 AM    Wt Readings from Last 3 Encounters:  01/14/19 190 lb (86.2 kg)  09/10/18 189 lb (85.7 kg)  08/26/18 188 lb 3.2 oz (85.4 kg)     Objective:    Vital Signs:  Ht 5\' 10"  (1.778 m)   Wt 190 lb (86.2 kg)   BMI 27.26 kg/m  134/85  VITAL SIGNS:  reviewed GEN:  no acute distress EYES:  sclerae anicteric, EOMI - Extraocular Movements Intact RESPIRATORY:  normal respiratory effort, symmetric expansion SKIN:  no rash, lesions or ulcers.  MUSCULOSKELETAL:  no obvious deformities. NEURO:  alert and oriented x 3, no obvious focal deficit PSYCH:  normal affect  ASSESSMENT & PLAN:    Coronary disease post CABG -Catheterization from above July 2019 reviewed.  Stable.  Excellent. No CP.  Essential hypertension -Stable, medications reviewed.  COPD with shortness of breath -[name] has been monitoring.  Medications reviewed.  Diabetes with hyperlipidemia - ALT 17, creatinine 1.16.  Prior LDL 70  COVID-19 Education: The signs and symptoms of COVID-19 were discussed with the patient and how to seek care for testing (follow up with PCP or arrange E-visit).  The importance of social distancing was discussed today.  Time:   Today, I have spent 15 minutes with the patient with telehealth technology discussing the above problems.     Medication Adjustments/Labs and Tests Ordered: Current medicines are reviewed at length with the patient today.  Concerns regarding medicines are outlined above.   Tests Ordered: No orders of the defined types were placed in this encounter.   Medication Changes: No orders of the defined types were placed in this encounter.   Disposition:  Follow up in 6 month(s)  Signed, Candee Furbish, MD  01/14/2019 1:45 PM    [facility]

## 2019-01-14 NOTE — Patient Instructions (Signed)
Medication Instructions:  No changes If you need a refill on your cardiac medications before your next appointment, please call your pharmacy.   Lab work: none If you have labs (blood work) drawn today and your tests are completely normal, you will receive your results only by: Marland Kitchen MyChart Message (if you have MyChart) OR . A paper copy in the mail If you have any lab test that is abnormal or we need to change your treatment, we will call you to review the results.  Testing/Procedures: none  Follow-Up: Your physician wants you to follow-up in: 6 months with Oscar Kicks, NP.  You will receive a reminder letter in the mail two months in advance. If you don't receive a letter, please call our office to schedule the follow-up appointment. Your physician wants you to follow-up in: 12 months with Oscar Robinson.   You will receive a reminder letter in the mail two months in advance. If you don't receive a letter, please call our office to schedule the follow-up appointment.  Any Other Special Instructions Will Be Listed Below (If Applicable).

## 2019-01-20 DIAGNOSIS — L84 Corns and callosities: Secondary | ICD-10-CM | POA: Diagnosis not present

## 2019-01-20 DIAGNOSIS — E119 Type 2 diabetes mellitus without complications: Secondary | ICD-10-CM | POA: Diagnosis not present

## 2019-01-26 ENCOUNTER — Other Ambulatory Visit: Payer: Self-pay | Admitting: Cardiology

## 2019-01-30 ENCOUNTER — Other Ambulatory Visit: Payer: Self-pay | Admitting: Cardiology

## 2019-02-14 ENCOUNTER — Other Ambulatory Visit: Payer: Self-pay | Admitting: Cardiology

## 2019-02-27 ENCOUNTER — Other Ambulatory Visit: Payer: Self-pay | Admitting: Cardiology

## 2019-03-14 ENCOUNTER — Other Ambulatory Visit: Payer: Self-pay | Admitting: Emergency Medicine

## 2019-04-21 ENCOUNTER — Other Ambulatory Visit: Payer: Self-pay | Admitting: Cardiology

## 2019-04-23 ENCOUNTER — Telehealth: Payer: Self-pay | Admitting: Emergency Medicine

## 2019-04-23 NOTE — Telephone Encounter (Signed)
Called and spoke to pt. Pt is requesting to get his HD flu shot this year again. Appt made on the injection schedule for 9/21. Pt verbalized understanding and denied any further questions or concerns at this time.

## 2019-04-27 ENCOUNTER — Ambulatory Visit (INDEPENDENT_AMBULATORY_CARE_PROVIDER_SITE_OTHER): Payer: Medicare Other

## 2019-04-27 ENCOUNTER — Other Ambulatory Visit: Payer: Self-pay

## 2019-04-27 DIAGNOSIS — Z23 Encounter for immunization: Secondary | ICD-10-CM | POA: Diagnosis not present

## 2019-05-06 ENCOUNTER — Other Ambulatory Visit: Payer: Self-pay | Admitting: Emergency Medicine

## 2019-05-07 DIAGNOSIS — Z9641 Presence of insulin pump (external) (internal): Secondary | ICD-10-CM | POA: Diagnosis not present

## 2019-05-07 DIAGNOSIS — E049 Nontoxic goiter, unspecified: Secondary | ICD-10-CM | POA: Diagnosis not present

## 2019-05-07 DIAGNOSIS — M19049 Primary osteoarthritis, unspecified hand: Secondary | ICD-10-CM | POA: Diagnosis not present

## 2019-05-07 DIAGNOSIS — E1165 Type 2 diabetes mellitus with hyperglycemia: Secondary | ICD-10-CM | POA: Diagnosis not present

## 2019-05-07 DIAGNOSIS — I10 Essential (primary) hypertension: Secondary | ICD-10-CM | POA: Diagnosis not present

## 2019-05-07 DIAGNOSIS — E7800 Pure hypercholesterolemia, unspecified: Secondary | ICD-10-CM | POA: Diagnosis not present

## 2019-05-07 DIAGNOSIS — E02 Subclinical iodine-deficiency hypothyroidism: Secondary | ICD-10-CM | POA: Diagnosis not present

## 2019-05-29 ENCOUNTER — Other Ambulatory Visit: Payer: Self-pay | Admitting: Cardiology

## 2019-06-11 DIAGNOSIS — J069 Acute upper respiratory infection, unspecified: Secondary | ICD-10-CM | POA: Diagnosis not present

## 2019-06-11 DIAGNOSIS — Z20828 Contact with and (suspected) exposure to other viral communicable diseases: Secondary | ICD-10-CM | POA: Diagnosis not present

## 2019-06-17 ENCOUNTER — Ambulatory Visit (INDEPENDENT_AMBULATORY_CARE_PROVIDER_SITE_OTHER): Payer: Medicare Other | Admitting: Primary Care

## 2019-06-17 ENCOUNTER — Encounter: Payer: Self-pay | Admitting: Primary Care

## 2019-06-17 ENCOUNTER — Telehealth: Payer: Self-pay | Admitting: Emergency Medicine

## 2019-06-17 DIAGNOSIS — J441 Chronic obstructive pulmonary disease with (acute) exacerbation: Secondary | ICD-10-CM

## 2019-06-17 NOTE — Patient Instructions (Addendum)
Recommendations: - Take prednisone taper as directed (40mg  x 2 days; 30mg  x 2 days; 20mg  x 2 days; 10mg  x 2 days) - Take mucinex 600mg  twice daily as needed for chest congestion  - Continue Symbicort inhaler two puffs twice daily and Sprivia once daily   Follow-up: Return/call if symptoms do not improve or worsen

## 2019-06-17 NOTE — Progress Notes (Signed)
Virtual Visit via Telephone Note  I connected with Oscar Robinson on 06/17/19 at  3:30 PM EST by telephone and verified that I am speaking with the correct person using two identifiers.  Location: Patient: Home Provider: Office   I discussed the limitations, risks, security and privacy concerns of performing an evaluation and management service by telephone and the availability of in person appointments. I also discussed with the patient that there may be a patient responsible charge related to this service. The patient expressed understanding and agreed to proceed.   History of Present Illness: 73 year old male, former smoker (70 pack year hx). PMH significant for COPD GOLD C, OSA, GERD, DM. Patient of [name], last seen by pulmonary NP on 12/12/18.   06/17/2019 Patient contacted today for acute televisit. Reports increased shortness of breath x1 week. Associated productive cough. Patient took an old zpack prescription which he finished today. States that his cough has improved some and he is hardly coughing up any mucus. He is still having some shortness of breath and wheezing. Albuterol nebulizer has been helping. He has 10mg  prednisone tablets at home on hand. He was tested for Covid last week which was negative. No known sick contact.    Observations/Objective:  - No shortness of breath, wheezing or cough noted during phone conversation   Assessment and Plan:  COPD exacerbation - Persistent sob with wheezing. Cough improved with zpack - Continue Symbicort 160 bid; Spiriva handihaler 72mcg daily  - Mucinex 600mg  twice daily prn chest congestion  - Recommend prednisone taper (40mg  x 2 days, 30mg  x 2 days; 20mg  x 2 days; 10mg  x 2 days)   Follow Up Instructions: - Return/call if symptoms do not improve or worsen    I discussed the assessment and treatment plan with the patient. The patient was provided an opportunity to ask questions and all were answered. The patient agreed with  the plan and demonstrated an understanding of the instructions.   The patient was advised to call back or seek an in-person evaluation if the symptoms worsen or if the condition fails to improve as anticipated.  I provided 18 minutes of non-face-to-face time during this encounter.   Martyn Ehrich, NP

## 2019-06-17 NOTE — Telephone Encounter (Signed)
Called and spoke to patient. Patient stated that he has been having a sinus infection and took antibiotic last week. Patient is reporting increased shortness of breath and wheezing, using nebulizer 2-3 times a day. Patient reports that his cough has been productive with dark green phlegm. Patient denies fever and other symptoms. Patient reported could not due a MyChart visit due to no video access but agreed to a telephone visit. Scheduled patient for telephone visit with NP.  Nothing further needed at this time.

## 2019-06-23 ENCOUNTER — Other Ambulatory Visit: Payer: Self-pay | Admitting: Cardiology

## 2019-06-24 ENCOUNTER — Other Ambulatory Visit: Payer: Self-pay | Admitting: Cardiology

## 2019-06-24 MED ORDER — AMLODIPINE BESYLATE 5 MG PO TABS: 5.0000 mg | ORAL_TABLET | Freq: Every day | ORAL | 1 refills | Status: DC

## 2019-07-07 ENCOUNTER — Other Ambulatory Visit: Payer: Self-pay | Admitting: Emergency Medicine

## 2019-07-14 DIAGNOSIS — J209 Acute bronchitis, unspecified: Secondary | ICD-10-CM | POA: Diagnosis not present

## 2019-08-03 ENCOUNTER — Other Ambulatory Visit: Payer: Self-pay | Admitting: Cardiology

## 2019-08-21 DIAGNOSIS — H9201 Otalgia, right ear: Secondary | ICD-10-CM | POA: Diagnosis not present

## 2019-09-03 DIAGNOSIS — E7800 Pure hypercholesterolemia, unspecified: Secondary | ICD-10-CM | POA: Diagnosis not present

## 2019-09-03 DIAGNOSIS — E1165 Type 2 diabetes mellitus with hyperglycemia: Secondary | ICD-10-CM | POA: Diagnosis not present

## 2019-09-03 DIAGNOSIS — E02 Subclinical iodine-deficiency hypothyroidism: Secondary | ICD-10-CM | POA: Diagnosis not present

## 2019-09-04 DIAGNOSIS — M79672 Pain in left foot: Secondary | ICD-10-CM | POA: Diagnosis not present

## 2019-09-04 DIAGNOSIS — M79671 Pain in right foot: Secondary | ICD-10-CM | POA: Diagnosis not present

## 2019-09-10 DIAGNOSIS — E049 Nontoxic goiter, unspecified: Secondary | ICD-10-CM | POA: Diagnosis not present

## 2019-09-10 DIAGNOSIS — M19049 Primary osteoarthritis, unspecified hand: Secondary | ICD-10-CM | POA: Diagnosis not present

## 2019-09-10 DIAGNOSIS — E02 Subclinical iodine-deficiency hypothyroidism: Secondary | ICD-10-CM | POA: Diagnosis not present

## 2019-09-10 DIAGNOSIS — E1165 Type 2 diabetes mellitus with hyperglycemia: Secondary | ICD-10-CM | POA: Diagnosis not present

## 2019-09-10 DIAGNOSIS — I10 Essential (primary) hypertension: Secondary | ICD-10-CM | POA: Diagnosis not present

## 2019-09-10 DIAGNOSIS — E7800 Pure hypercholesterolemia, unspecified: Secondary | ICD-10-CM | POA: Diagnosis not present

## 2019-09-10 DIAGNOSIS — Z9641 Presence of insulin pump (external) (internal): Secondary | ICD-10-CM | POA: Diagnosis not present

## 2019-09-27 ENCOUNTER — Ambulatory Visit: Payer: Medicare Other | Attending: Internal Medicine

## 2019-09-27 DIAGNOSIS — Z23 Encounter for immunization: Secondary | ICD-10-CM | POA: Insufficient documentation

## 2019-09-27 NOTE — Progress Notes (Signed)
   Covid-19 Vaccination Clinic  Name:  Oscar Robinson    MRN: [id number] DOB: February 02, 1946  09/27/2019  [name] was observed post Covid-19 immunization for 15 minutes without incidence. He was provided with Vaccine Information Sheet and instruction to access the V-Safe system.   [name] was instructed to call 911 with any severe reactions post vaccine: Marland Kitchen Difficulty breathing  . Swelling of your face and throat  . A fast heartbeat  . A bad rash all over your body  . Dizziness and weakness    Immunizations Administered    Name Date Dose VIS Date Route   Pfizer COVID-19 Vaccine 09/27/2019 11:54 AM 0.3 mL 07/17/2019 Intramuscular   Manufacturer: [place]   Lot: [id number]   [place]: [phone]

## 2019-10-20 ENCOUNTER — Telehealth: Payer: Self-pay | Admitting: Emergency Medicine

## 2019-10-20 NOTE — Telephone Encounter (Signed)
I checked with Opal Sidles in clerical department and nothing has came on the fax as of yet.

## 2019-10-21 ENCOUNTER — Ambulatory Visit: Payer: Medicare Other | Attending: Internal Medicine

## 2019-10-21 DIAGNOSIS — Z23 Encounter for immunization: Secondary | ICD-10-CM

## 2019-10-21 DIAGNOSIS — E02 Subclinical iodine-deficiency hypothyroidism: Secondary | ICD-10-CM | POA: Diagnosis not present

## 2019-10-21 NOTE — Telephone Encounter (Signed)
Form has been received and has been placed in RB's sign folder.

## 2019-10-21 NOTE — Progress Notes (Signed)
   Covid-19 Vaccination Clinic  Name:  Oscar Robinson    MRN: [id number] DOB: May 14, 1946  10/21/2019  [name] was observed post Covid-19 immunization for 15 minutes without incident. He was provided with Vaccine Information Sheet and instruction to access the V-Safe system.   [name] was instructed to call 911 with any severe reactions post vaccine: Marland Kitchen Difficulty breathing  . Swelling of face and throat  . A fast heartbeat  . A bad rash all over body  . Dizziness and weakness   Immunizations Administered    Name Date Dose VIS Date Route   Pfizer COVID-19 Vaccine 10/21/2019 12:02 PM 0.3 mL 07/17/2019 Intramuscular   Manufacturer: [place]   Lot: [id number]   [place]: [id number]

## 2019-10-22 ENCOUNTER — Telehealth: Payer: Self-pay | Admitting: Emergency Medicine

## 2019-10-22 NOTE — Telephone Encounter (Signed)
Called [facility] they state they faxed over an RX for nebulizer and equipment.  No fax found in [name] mailbox or mail folder,   Will keep in triage and follow up tomorrow 10/23/19

## 2019-10-22 NOTE — Telephone Encounter (Signed)
Signed 3/18

## 2019-10-23 NOTE — Telephone Encounter (Signed)
Checked RB's folder in B pod and up front, did not see any forms for this pt.   Attempted to call [facility], an automated message stated "nobody is able to take your call right now, thank you" repeatedly with no option for VM.  Wcb.

## 2019-10-28 NOTE — Telephone Encounter (Signed)
[place] to see if these documents could be resent. I received the same message that Caryl Pina did on 10/23/2019. Will try back.

## 2019-11-27 ENCOUNTER — Encounter: Payer: Self-pay | Admitting: Pulmonary Disease

## 2019-11-27 ENCOUNTER — Other Ambulatory Visit: Payer: Self-pay | Admitting: *Deleted

## 2019-11-27 ENCOUNTER — Other Ambulatory Visit: Payer: Self-pay

## 2019-11-27 ENCOUNTER — Ambulatory Visit (INDEPENDENT_AMBULATORY_CARE_PROVIDER_SITE_OTHER): Payer: Medicare Other | Admitting: Pulmonary Disease

## 2019-11-27 ENCOUNTER — Ambulatory Visit (INDEPENDENT_AMBULATORY_CARE_PROVIDER_SITE_OTHER): Payer: Medicare Other

## 2019-11-27 VITALS — BP 130/76 | HR 82 | Temp 97.7°F | Ht 68.0 in | Wt 184.8 lb

## 2019-11-27 DIAGNOSIS — R0602 Shortness of breath: Secondary | ICD-10-CM | POA: Diagnosis not present

## 2019-11-27 DIAGNOSIS — J449 Chronic obstructive pulmonary disease, unspecified: Secondary | ICD-10-CM | POA: Diagnosis not present

## 2019-11-27 DIAGNOSIS — Z87891 Personal history of nicotine dependence: Secondary | ICD-10-CM

## 2019-11-27 DIAGNOSIS — K219 Gastro-esophageal reflux disease without esophagitis: Secondary | ICD-10-CM

## 2019-11-27 MED ORDER — SPIRIVA RESPIMAT 2.5 MCG/ACT IN AERS: 2.0000 | INHALATION_SPRAY | Freq: Every day | RESPIRATORY_TRACT | 0 refills | Status: DC

## 2019-11-27 NOTE — Patient Instructions (Addendum)
You were seen today by Lauraine Rinne, NP  for:   1. Chronic obstructive pulmonary disease, unspecified COPD type ([place])  - DG Chest 2 View; Future  Continue Symbicort 160 >>> 2 puffs in the morning right when you wake up, rinse out your mouth after use, 12 hours later 2 puffs, rinse after use >>> Take this daily, no matter what >>> This is not a rescue inhaler   Trial of Spiriva Respimat 2.5 >>> 2 puffs daily >>> Do this every day >>>This is not a rescue inhaler  Hold Spiriva Handihaler   Note your daily symptoms > remember "red flags" for COPD:   >>>Increase in cough >>>increase in sputum production >>>increase in shortness of breath or activity  intolerance.   If you notice these symptoms, please call the office to be seen.    2. Gastroesophageal reflux disease, unspecified whether esophagitis present  Omeprazole 20 mg tablet  >>>Please take 1 tablet daily 15 minutes to 30 minutes before your first meal of the day and last meal of the day as well as before your other medications >>>Try to take at the same time each day >>>take this medication daily  GERD management: >>>Avoid laying flat until 2 hours after meals >>>Elevate head of the bed including entire chest >>>Reduce size of meals and amount of fat, acid, spices, caffeine and sweets >>>If you are smoking, Please stop! >>>Decrease alcohol consumption >>>Work on maintaining a healthy weight with normal BMI     3. TOBACCO ABUSE, HX OF  Chest Xray today   Continue to not smoke  4. Shortness of breath  Walk today in office, tolerated well without any desats  We will trial you on a new version of Spiriva instead of your HandiHaler  We will get an x-ray today  We will see you back in 4 weeks   We recommend today:  Orders Placed This Encounter  Procedures  . DG Chest 2 View    Standing Status:   Future    Standing Expiration Date:   01/26/2021    Order Specific Question:   Reason for Exam (SYMPTOM  OR  DIAGNOSIS REQUIRED)    Answer:   copd, former smoker    Order Specific Question:   Preferred imaging location?    Answer:   Internal    Order Specific Question:   Radiology Contrast Protocol - do NOT remove file path    Answer:   [email or web address]   Orders Placed This Encounter  Procedures  . DG Chest 2 View   No orders of the defined types were placed in this encounter.   Follow Up:    Return in about 4 weeks (around 12/25/2019), or if symptoms worsen or fail to improve, for Follow up with [name], Follow up with Wyn Quaker FNP-C.  If nothing available with [name] in 4 weeks please schedule or place recall for appointment with [name] in 8 to 12 weeks in addition to the 4-week follow-up.  Please do your part to reduce the spread of COVID-19:      Reduce your risk of any infection  and COVID19 by using the similar precautions used for avoiding the common cold or flu:  Marland Kitchen Wash your hands often with soap and warm water for at least 20 seconds.  If soap and water are not readily available, use an alcohol-based hand sanitizer with at least 60% alcohol.  . If coughing or sneezing, cover your mouth and nose by coughing or sneezing  into the elbow areas of your shirt or coat, into a tissue or into your sleeve (not your hands). Langley Gauss A MASK when in public  . Avoid shaking hands with others and consider head nods or verbal greetings only. . Avoid touching your eyes, nose, or mouth with unwashed hands.  . Avoid close contact with people who are sick. . Avoid places or events with large numbers of people in one location, like concerts or sporting events. . If you have some symptoms but not all symptoms, continue to monitor at home and seek medical attention if your symptoms worsen. . If you are having a medical emergency, call 911.   [place] / e-Visit:  [email or web address]         [facility]: [facility]: [id number]                   [facility]: [id number]     It is flu season:   >>> Best ways to protect herself from the flu: Receive the yearly flu vaccine, practice good hand hygiene washing with soap and also using hand sanitizer when available, eat a nutritious meals, get adequate rest, hydrate appropriately   Please contact the office if your symptoms worsen or you have concerns that you are not improving.   Thank you for choosing [facility] for your healthcare, and for allowing [place] to partner with you on your healthcare journey. I am thankful to be able to provide care to you today.   Wyn Quaker FNP-C

## 2019-11-27 NOTE — Assessment & Plan Note (Signed)
Breakthrough reflux Patient decreased PPI use during COVID-19 pandemic  Plan: Resume Nexium twice daily Reviewed lifestyle changes and recommendations for acid reflux management

## 2019-11-27 NOTE — Addendum Note (Signed)
Addended by: Vanessa Barbara on: 11/27/2019 04:43 PM   Modules accepted: Orders

## 2019-11-27 NOTE — Progress Notes (Signed)
@Patient  ID: Oscar Robinson, male    DOB: 1946/07/28, 74 y.o.   MRN: [id number]  Chief Complaint  Patient presents with  . Follow-up    COPD.  more sob for the last 2 mos.  Pollen has bothered him, eyes matted in the morning.  sneezing infrequently.    Referring provider: [name], MD  HPI:  74 year old male former smoker followed in our office for severe COPD  PMH: Type 2 diabetes, GERD Smoker/ Smoking History: Former smoker.  Quit 1999.  70-pack-year smoking history Maintenance: Symbicort 160, Spiriva HandiHaler Pt of: [name]  11/27/2019  - Visit   74 year old male former smoker found our office for severe COPD.  Patient presenting to our office as a follow-up visit today.  Patient reporting he has been more short of breath over the last 2 months.  Patient walked today in office did not have any oxygen desaturations.  Patient specifically reporting worsening shortness of breath when walking up inclines.  He remains adherent to Symbicort 160 as well as Spiriva HandiHaler.  He denies increase sputum production or sputum color discoloration.  He has not had a recent antibiotics.  His last visit with our office was in November/2020 which was a telephonic visit where he was treated for an exacerbation with prednisone.  Questionaires / Pulmonary Flowsheets:   MMRC: mMRC Dyspnea Scale mMRC Score  11/27/2019 3   Epworth:  Results of the Epworth flowsheet 03/03/2018  Sitting and reading 1  Watching TV 1  Sitting, inactive in a public place (e.g. a theatre or a meeting) 0  As a passenger in a car for an hour without a break 0  Lying down to rest in the afternoon when circumstances permit 0  Sitting and talking to someone 0  Sitting quietly after a lunch without alcohol 1  In a car, while stopped for a few minutes in traffic 0  Total score 3    Tests:   01/28/2018-chest x-ray-no active pulmonary disease, mild pulmonary hyperinflation  03/03/2018-spirometry- ratio 59,  FEV1 37, severe obstruction  Last cardiopulmonary exercise test was 07/08/2015, Showed that most of his exercise limitation was due to to ventilatory defect  Echocardiogram from 01/28/2018 was reviewed today and shows intact left ventricular function with a normal RV size and function, estimated PASP 23 mmHg.   08/26/2018-chest x-ray-COPD without acute abnormality  FENO:  Lab Results  Component Value Date   NITRICOXIDE 6 05/09/2016    PFT: PFT Results Latest Ref Rng & Units 04/07/2014  FVC-Pre L 2.49  FVC-Predicted Pre % 57  FVC-Post L 2.84  FVC-Predicted Post % 65  Pre FEV1/FVC % % 51  Post FEV1/FCV % % 56  FEV1-Pre L 1.28  FEV1-Predicted Pre % 39  FEV1-Post L 1.60  DLCO UNC% % 47  DLCO COR %Predicted % 57  TLC L 6.10  TLC % Predicted % 89  RV % Predicted % 145    WALK:  SIX MIN WALK 11/27/2019 01/30/2018 09/30/2017 09/13/2014 03/09/2014 11/03/2013  Supplimental Oxygen during Test? (L/min) No - No No No No  Tech Comments: Patient walked at a fast pace and did not have to stop for any rest breaks.  Tolerated walk well. Walked a rapid pace, reports SOB. Tolerated well.  ambulated at fast, steady pace, denies SOB pt walked a fast pace.  - -    Imaging: No results found.  Lab Results:  CBC    Component Value Date/Time   WBC 8.6 07/31/2018 1438  RBC 4.22 07/31/2018 1438   HGB 12.9 (L) 07/31/2018 1438   HGB 13.2 01/29/2018 1436   HCT 39.2 07/31/2018 1438   HCT 38.4 01/29/2018 1436   PLT 204.0 07/31/2018 1438   PLT 253 01/29/2018 1436   MCV 92.7 07/31/2018 1438   MCV 94 01/29/2018 1436   MCH 32.4 01/29/2018 1436   MCH 21.3 (L) 06/05/2016 1055   MCHC 33.1 07/31/2018 1438   RDW 14.9 07/31/2018 1438   RDW 14.2 01/29/2018 1436   LYMPHSABS 2.0 07/31/2018 1438   MONOABS 1.1 (H) 07/31/2018 1438   EOSABS 0.2 07/31/2018 1438   BASOSABS 0.1 07/31/2018 1438    BMET    Component Value Date/Time   NA 138 01/06/2019 1524   K 4.8 01/06/2019 1524   CL 100 01/06/2019 1524    CO2 23 01/06/2019 1524   GLUCOSE 204 (H) 01/06/2019 1524   GLUCOSE 140 (H) 06/05/2016 1055   BUN 15 01/06/2019 1524   CREATININE 1.16 01/06/2019 1524   CREATININE 1.26 (H) 06/05/2016 1055   CALCIUM 9.2 01/06/2019 1524   GFRNONAA 63 01/06/2019 1524   GFRAA 73 01/06/2019 1524    BNP No results found for: BNP  ProBNP    Component Value Date/Time   PROBNP 73 01/29/2018 1436    Specialty Problems      Pulmonary Problems   GOLD COPD B-C    Qualifier: Diagnosis of  By: [name] MD, Rose Fillers  04/07/2014-pulmonary function test- ratio 51, FEV1 39, significant bronchodilator response, DLCO 47, concavity and flow volume loops 03/03/2018-spirometry- ratio 59, FEV1 37, severe obstruction        Dyspnea   Obstructive sleep apnea   Upper respiratory tract infection      No Known Allergies  Immunization History  Administered Date(s) Administered  . Fluad Quad(high Dose 65+) 04/27/2019  . H1N1 07/09/2008  . Influenza Split 05/06/2012, 06/21/2015  . Influenza Whole 05/23/2010, 05/07/2011  . Influenza, High Dose Seasonal PF 05/23/2016, 04/14/2018  . Influenza,inj,Quad PF,6+ Mos 05/01/2013, 05/07/2014  . Influenza-Unspecified 04/01/2017  . PFIZER SARS-COV-2 Vaccination 09/27/2019, 10/21/2019  . Pneumococcal Conjugate-13 03/09/2014  . Pneumococcal Polysaccharide-23 05/23/2010, 06/23/2018  . Zoster 12/05/2014    Past Medical History:  Diagnosis Date  . Anemia   . Anxiety   . Arthritis   . CKD (chronic kidney disease), stage II   . COPD (chronic obstructive pulmonary disease) ([place])   . Coronary artery disease    a. s/p CABG 1999.  . Depression   . Diabetes mellitus   . Diverticulosis   . GERD (gastroesophageal reflux disease)   . Hiatal hernia   . Hypercholesteremia   . Hypertension   . Sleep apnea    had sleep study and negative per pt  . Thyroid disease   . Tobacco abuse   . Tubular adenoma of colon     Tobacco History: Social History   Tobacco Use  Smoking  Status Former Smoker  . Packs/day: 2.00  . Years: 35.00  . Pack years: 70.00  . Types: Cigarettes  . Quit date: 09/27/1997  . Years since quitting: 22.1  Smokeless Tobacco Never Used   Counseling given: Yes   Continue to not smoke  Outpatient Encounter Medications as of 11/27/2019  Medication Sig  . albuterol (PROAIR HFA) 108 (90 Base) MCG/ACT inhaler Inhale 2 puffs into the lungs every 4 (four) hours as needed for wheezing or shortness of breath.  Marland Kitchen albuterol (PROVENTIL) (2.5 MG/3ML) 0.083% nebulizer solution Take 3 mLs (2.5 mg total)  by nebulization every 6 (six) hours as needed for wheezing or shortness of breath.  . ALPRAZolam (XANAX) 0.5 MG tablet Take 0.5 mg by mouth at bedtime.   Marland Kitchen amLODipine (NORVASC) 5 MG tablet Take 1 tablet (5 mg total) by mouth daily.  . Ascorbic Acid (VITAMIN C) 1000 MG tablet Take 1,000 mg by mouth daily.  Marland Kitchen aspirin 81 MG tablet Take 243 mg by mouth at bedtime.   . Cholecalciferol (VITAMIN D3) 50 MCG (2000 UT) TABS Take 1 tablet by mouth daily.  . Coenzyme Q10 (CO Q 10) 100 MG CAPS Take 100 mg by mouth at bedtime.   . colesevelam (WELCHOL) 625 MG tablet TAKE 3 TABLETS BY MOUTH TWICE DAILY WITH MEALS  . doxepin (SINEQUAN) 50 MG capsule Take 100 mg by mouth at bedtime.   Marland Kitchen esomeprazole (NEXIUM) 20 MG capsule Take 20 mg by mouth 2 (two) times daily.  . furosemide (LASIX) 20 MG tablet Take 1 tablet (20 mg total) by mouth daily as needed (ankle swelling).  Marland Kitchen HUMALOG 100 UNIT/ML injection USE VIA PUMP DAILY. MAX DAILY DOSE 150 UNITS PER DAY.  Marland Kitchen levothyroxine (SYNTHROID) 50 MCG tablet Take 50 mcg by mouth daily.  Marland Kitchen losartan (COZAAR) 50 MG tablet Take 1 tablet by mouth once daily  . metFORMIN (GLUCOPHAGE) 500 MG tablet Take 1 tablet (500 mg total) by mouth 2 (two) times daily with a meal.  . metoprolol succinate (TOPROL-XL) 50 MG 24 hr tablet TAKE 1 TABLET BY MOUTH ONCE DAILY WITH A MEAL OR  IMMEDIATELY  FOLLOWING  A  MEAL  . Multiple Vitamins-Minerals (CENTRUM  SILVER 50+MEN) TABS Take 1 tablet by mouth daily.  . nitroGLYCERIN (NITROSTAT) 0.4 MG SL tablet Place 1 tablet (0.4 mg total) under the tongue every 5 (five) minutes as needed for chest pain.  . Omega-3 Fatty Acids (FISH OIL) 600 MG CAPS Take 1,400 mg by mouth at bedtime.   . pravastatin (PRAVACHOL) 40 MG tablet Take 1 tablet by mouth once daily  . SPIRIVA HANDIHALER 18 MCG inhalation capsule INHALE 1 PUFF BY MOUTH INTO THE LUNGS  ONCE DAILY  . SYMBICORT 160-4.5 MCG/ACT inhaler INHALE 2 PUFFS BY MOUTH EVERY 12 HOURS, RINSE MOUTH AFTER EVERY USE.   Facility-Administered Encounter Medications as of 11/27/2019  Medication  . 0.9 %  sodium chloride infusion     Review of Systems  Review of Systems  Constitutional: Positive for fatigue. Negative for activity change, chills, fever and unexpected weight change.  HENT: Negative for postnasal drip, rhinorrhea, sinus pressure, sinus pain and sore throat.   Eyes: Negative.   Respiratory: Positive for cough (Occasional, feels this is baseline) and shortness of breath. Negative for wheezing.   Cardiovascular: Negative for chest pain and palpitations.  Gastrointestinal: Negative for constipation, diarrhea, nausea and vomiting.  Endocrine: Negative.   Genitourinary: Negative.   Musculoskeletal: Negative.   Skin: Negative.   Neurological: Negative for dizziness and headaches.  Psychiatric/Behavioral: Negative.  Negative for dysphoric mood. The patient is not nervous/anxious.   All other systems reviewed and are negative.    Physical Exam  BP 130/76 (BP Location: Left Arm, Cuff Size: Normal)   Pulse 82   Temp 97.7 F (36.5 C) (Temporal)   Ht 5\' 8"  (1.727 m)   Wt 184 lb 12.8 oz (83.8 kg)   SpO2 100%   BMI 28.10 kg/m   Wt Readings from Last 5 Encounters:  11/27/19 184 lb 12.8 oz (83.8 kg)  01/14/19 190 lb (86.2 kg)  09/10/18  189 lb (85.7 kg)  08/26/18 188 lb 3.2 oz (85.4 kg)  08/21/18 193 lb (87.5 kg)    BMI Readings from Last 5  Encounters:  11/27/19 28.10 kg/m  01/14/19 27.26 kg/m  09/10/18 27.12 kg/m  08/26/18 27.00 kg/m  08/21/18 27.69 kg/m     Physical Exam Vitals and nursing note reviewed.  Constitutional:      General: He is not in acute distress.    Appearance: Normal appearance. He is normal weight.  HENT:     Head: Normocephalic and atraumatic.     Right Ear: Hearing, tympanic membrane, ear canal and external ear normal. There is no impacted cerumen.     Left Ear: Hearing, tympanic membrane, ear canal and external ear normal. There is impacted cerumen.     Nose: Nose normal. No mucosal edema or rhinorrhea.     Right Turbinates: Not enlarged.     Left Turbinates: Not enlarged.     Mouth/Throat:     Mouth: Mucous membranes are dry.     Pharynx: Oropharynx is clear. No oropharyngeal exudate.  Eyes:     Pupils: Pupils are equal, round, and reactive to light.  Cardiovascular:     Rate and Rhythm: Normal rate and regular rhythm.     Pulses: Normal pulses.     Heart sounds: Normal heart sounds. No murmur.  Pulmonary:     Effort: Pulmonary effort is normal.     Breath sounds: No decreased breath sounds, wheezing or rales.     Comments: Diminished in bases Abdominal:     General: Bowel sounds are normal. There is no distension.     Palpations: Abdomen is soft.     Tenderness: There is no abdominal tenderness.  Musculoskeletal:     Cervical back: Normal range of motion.     Right lower leg: No edema.     Left lower leg: No edema.  Lymphadenopathy:     Cervical: No cervical adenopathy.  Skin:    General: Skin is warm and dry.     Capillary Refill: Capillary refill takes less than 2 seconds.     Findings: No erythema or rash.  Neurological:     General: No focal deficit present.     Mental Status: He is alert and oriented to person, place, and time.     Motor: No weakness.     Coordination: Coordination normal.     Gait: Gait is intact. Gait (Tolerated walk in office without any  assistance, brisk pace) normal.  Psychiatric:        Mood and Affect: Mood normal.        Behavior: Behavior normal. Behavior is cooperative.        Thought Content: Thought content normal.        Judgment: Judgment normal.       Assessment & Plan:   GOLD COPD B-C Plan: Continue Symbicort 160 Trial of Spiriva Respimat 2.5 Stop Spiriva HandiHaler Samples of Spiriva Respimat provided today, patient instructed on how to use device 4-week follow-up to further evaluate Chest x-ray today  GASTROESOPHAGEAL REFLUX DISEASE Breakthrough reflux Patient decreased PPI use during COVID-19 pandemic  Plan: Resume Nexium twice daily Reviewed lifestyle changes and recommendations for acid reflux management  TOBACCO ABUSE, HX OF Plan: Chest x-ray today Continue to not smoke  Dyspnea Patient reporting dyspnea mMRC 3 today Known COPD  Plan: Chest x-ray today We will trial Spiriva Respimat 2.5 Close follow-up in 4 weeks    Return in about 4  weeks (around 12/25/2019), or if symptoms worsen or fail to improve, for Follow up with [name], Follow up with Wyn Quaker FNP-C.   Lauraine Rinne, NP 11/27/2019   This appointment required 32 minutes of patient care (this includes precharting, chart review, review of results, face-to-face care, etc.).

## 2019-11-27 NOTE — Assessment & Plan Note (Signed)
Plan: Chest x-ray today Continue to not smoke

## 2019-11-27 NOTE — Assessment & Plan Note (Signed)
Plan: Continue Symbicort 160 Trial of Spiriva Respimat 2.5 Stop Spiriva HandiHaler Samples of Spiriva Respimat provided today, patient instructed on how to use device 4-week follow-up to further evaluate Chest x-ray today

## 2019-11-27 NOTE — Assessment & Plan Note (Signed)
Patient reporting dyspnea mMRC 3 today Known COPD  Plan: Chest x-ray today We will trial Spiriva Respimat 2.5 Close follow-up in 4 weeks

## 2019-11-30 ENCOUNTER — Ambulatory Visit: Payer: Medicare Other | Admitting: Pulmonary Disease

## 2019-12-03 ENCOUNTER — Encounter: Payer: Self-pay | Admitting: Emergency Medicine

## 2019-12-03 ENCOUNTER — Other Ambulatory Visit: Payer: Self-pay

## 2019-12-03 ENCOUNTER — Ambulatory Visit (INDEPENDENT_AMBULATORY_CARE_PROVIDER_SITE_OTHER): Payer: Medicare Other | Admitting: Emergency Medicine

## 2019-12-03 VITALS — BP 138/68 | HR 99 | Temp 98.3°F | Ht 70.0 in | Wt 185.2 lb

## 2019-12-03 DIAGNOSIS — J449 Chronic obstructive pulmonary disease, unspecified: Secondary | ICD-10-CM | POA: Diagnosis not present

## 2019-12-03 MED ORDER — DOXYCYCLINE HYCLATE 100 MG PO TABS: 100.0000 mg | ORAL_TABLET | Freq: Two times a day (BID) | ORAL | 0 refills | Status: DC

## 2019-12-03 NOTE — Assessment & Plan Note (Signed)
Difficult to fully discern whether this is more subacute than acute.  He does have wheezing and an increase in mucus production that would be consistent with an acute exacerbation.  I treated him for similar findings the last time I saw him.  He would like to avoid prednisone if possible given its effects on his CBG.  I think he probably needs to be treated for acute exacerbation however.  Will give him doxycycline, prednisone now.  Plan to repeat his walking oximetry because he almost desaturated at his last visit.  If he does desaturate he is willing to use supplemental oxygen.  We will continue the Spiriva Respimat for now, see if he gets more benefit than his previous HandiHaler.  Continue the Symbicort.  He may be a candidate for Breztri at some point going forward.  He did not tolerate Trelegy. If his symptoms, wheeze end up being more sub-acute then we would have to consider other adjunct therapies including possible cycled antibiotics, even low-dose daily prednisone (which he would like to avoid).

## 2019-12-03 NOTE — Progress Notes (Signed)
HPI:  ROV 12/03/19 --Oscar Robinson is a 74 year old gentleman with a history of severe COPD, CAD/CABG, hypertension, allergic rhinitis, GERD.  We have been managing him on Symbicort, Spiriva HandiHaler. He has tried Trelegy before - gave him sore throat so we stopped it.   He was seen here a week ago and was having increased allergy symptoms, itching eyes, sneezing.  He also had an increase in his dyspnea.  His Spiriva was changed over to Spiriva Respimat, continued Symbicort. He is unsure that it has made much difference yet.  He did not desaturate at his last office visit although his SPO2 did go to 90%. He continues to have more exertional SOB than previously, has been progressive over several yrs. Seems to be able to walk on a treadmill, flat ground. Can get severe dyspnea after showering or with housework. He has daily cough, mucous is darker in the am, clear later. Has noticed wheeze last few days.   Chest x-ray 11/27/2019 reviewed by me, shows no infiltrates, no abnormalities but hyperinflation.   EXAM:  Vitals:   12/03/19 1017  BP: 138/68  Pulse: 99  Temp: 98.3 F (36.8 C)  TempSrc: Temporal  SpO2: 97%  Weight: 185 lb 3.2 oz (84 kg)  Height: 5\' 10"  (1.778 m)   Gen: Pleasant, well-nourished, in no distress,  normal affect  ENT: No lesions,  mouth clear,  oropharynx clear, no postnasal drip  Neck: No JVD, no stridor  Lungs: No use of accessory muscles, B expiratory wheezing all fields  Cardiovascular: RRR, heart sounds normal, no murmur or gallops, no peripheral edema  Musculoskeletal: No deformities, no cyanosis or clubbing  Neuro: alert, non focal  Skin: Warm, no lesions or rashes   GOLD COPD C Difficult to fully discern whether this is more subacute than acute.  He does have wheezing and an increase in mucus production that would be consistent with an acute exacerbation.  I treated him for similar findings the last time I saw him.  He would like to avoid prednisone if possible  given its effects on his CBG.  I think he probably needs to be treated for acute exacerbation however.  Will give him doxycycline, prednisone now.  Plan to repeat his walking oximetry because he almost desaturated at his last visit.  If he does desaturate he is willing to use supplemental oxygen.  We will continue the Spiriva Respimat for now, see if he gets more benefit than his previous HandiHaler.  Continue the Symbicort.  He may be a candidate for Breztri at some point going forward.  He did not tolerate Trelegy. If his symptoms, wheeze end up being more sub-acute then we would have to consider other adjunct therapies including possible cycled antibiotics, even low-dose daily prednisone (which he would like to avoid).    Baltazar Apo, MD, PhD 12/03/2019, 10:40 AM [facility] [phone] or if no answer [phone]

## 2019-12-03 NOTE — Patient Instructions (Signed)
Please take doxycycline 100 mg twice a day for 7 days Please take prednisone as directed until completely gone We will repeat your walking oximetry on room air today. Please continue Symbicort and Spiriva Respimat as you have been taking them.  We will fill the Spiriva Respimat for you.  Rinse and gargle after you use these medications. Keep your albuterol available to use either 2 puffs or 1 nebulizer treatment up to every 4 hours if needed for shortness of breath, chest tightness, wheezing. Follow with Dr [name] or APP in 1 month

## 2019-12-08 ENCOUNTER — Telehealth: Payer: Self-pay | Admitting: Emergency Medicine

## 2019-12-08 DIAGNOSIS — Z9641 Presence of insulin pump (external) (internal): Secondary | ICD-10-CM | POA: Diagnosis not present

## 2019-12-08 DIAGNOSIS — E02 Subclinical iodine-deficiency hypothyroidism: Secondary | ICD-10-CM | POA: Diagnosis not present

## 2019-12-08 DIAGNOSIS — E7800 Pure hypercholesterolemia, unspecified: Secondary | ICD-10-CM | POA: Diagnosis not present

## 2019-12-08 DIAGNOSIS — E1165 Type 2 diabetes mellitus with hyperglycemia: Secondary | ICD-10-CM | POA: Diagnosis not present

## 2019-12-08 DIAGNOSIS — E049 Nontoxic goiter, unspecified: Secondary | ICD-10-CM | POA: Diagnosis not present

## 2019-12-08 DIAGNOSIS — I10 Essential (primary) hypertension: Secondary | ICD-10-CM | POA: Diagnosis not present

## 2019-12-08 NOTE — Telephone Encounter (Signed)
Spoke with patient. He was seen by RB on 4/29 and was given Doxycycline. He has been taking the doxy and did notice a slight difference on 5/1. He stayed inside all day and did not have to use his albuterol nebulizer solution. The cough seemed to go away as well. On 5/2, his symptoms got worse. He has been coughing up dark green phlegm. He has also noticed an increase in SOB. He denied any chest or back pain. Denied any fevers.   He was wondering if he could have a prednisone taper called in for him.   Pharmacy is [facility] in [place].   RB, please advise. Thanks!

## 2019-12-09 MED ORDER — PREDNISONE 10 MG PO TABS: ORAL_TABLET | ORAL | 0 refills | Status: DC

## 2019-12-09 NOTE — Telephone Encounter (Signed)
Spoke with pt. He is aware of [name] recommendation. Rx has been sent in. Nothing further was needed.

## 2019-12-09 NOTE — Telephone Encounter (Signed)
Pt called back about this. Please return call.  

## 2019-12-09 NOTE — Telephone Encounter (Signed)
We are still waiting on a reply from [name]. ATC pt, there was no answer and no option to leave a message.  *Page was sent to [name] at 1425.

## 2019-12-09 NOTE — Telephone Encounter (Signed)
Yes, please have him finish the doxy and do pred >  Take 40mg  daily for 3 days, then 30mg  daily for 3 days, then 20mg  daily for 3 days, then 10mg  daily for 3 days, then stop

## 2019-12-18 ENCOUNTER — Other Ambulatory Visit: Payer: Self-pay | Admitting: Cardiology

## 2019-12-25 ENCOUNTER — Ambulatory Visit (INDEPENDENT_AMBULATORY_CARE_PROVIDER_SITE_OTHER): Payer: Medicare Other | Admitting: Emergency Medicine

## 2019-12-25 ENCOUNTER — Other Ambulatory Visit: Payer: Self-pay

## 2019-12-25 ENCOUNTER — Encounter: Payer: Self-pay | Admitting: Emergency Medicine

## 2019-12-25 DIAGNOSIS — J449 Chronic obstructive pulmonary disease, unspecified: Secondary | ICD-10-CM

## 2019-12-25 NOTE — Progress Notes (Signed)
HPI:  ROV 12/03/19 --Oscar Robinson is a 74 year old gentleman with a history of severe COPD, CAD/CABG, hypertension, allergic rhinitis, GERD.  We have been managing him on Symbicort, Spiriva HandiHaler. He has tried Trelegy before - gave him sore throat so we stopped it.   He was seen here a week ago and was having increased allergy symptoms, itching eyes, sneezing.  He also had an increase in his dyspnea.  His Spiriva was changed over to Spiriva Respimat, continued Symbicort. He is unsure that it has made much difference yet.  He did not desaturate at his last office visit although his SPO2 did go to 90%. He continues to have more exertional SOB than previously, has been progressive over several yrs. Seems to be able to walk on a treadmill, flat ground. Can get severe dyspnea after showering or with housework. He has daily cough, mucous is darker in the am, clear later. Has noticed wheeze last few days.   Chest x-ray 11/27/2019 reviewed by me, shows no infiltrates, no abnormalities but hyperinflation.  ROV 12/25/19 --74 year old man with severe COPD, history of CAD/CABG, hypertension, allergic rhinitis, GERD.  I saw him a month ago at which time he was having more allergy symptoms, persistent exertional dyspnea that he feels may be a little worse.  I treated him with doxycycline, prednisone for possible acute exacerbation.  He reports that he has improved with this. Feels back to baseline now. He is on Spiriva, Symbicort. Using albuterol about 0-1x a day, much less nebulizer use. Wants to change the spiriva to the respimat version.  COVID vaccine up to date.    EXAM:  Vitals:   12/25/19 1427  BP: 128/68  Pulse: 82  Temp: 98.4 F (36.9 C)  TempSrc: Temporal  SpO2: 99%  Weight: 188 lb 3.2 oz (85.4 kg)  Height: 5\' 8"  (1.727 m)   Gen: Pleasant, well-nourished, in no distress,  normal affect  ENT: No lesions,  mouth clear,  oropharynx clear, no postnasal drip  Neck: No JVD, no stridor  Lungs: No use  of accessory muscles, no wheeze, much improved compared with last exam  Cardiovascular: RRR, heart sounds normal, no murmur or gallops, no peripheral edema  Musculoskeletal: No deformities, no cyanosis or clubbing  Neuro: alert, non focal  Skin: Warm, no lesions or rashes   GOLD COPD C Significantly improved.  In retrospect his recent dyspnea sounds like most of an acute exacerbation.  His wheeze is much improved.  He likes the Spiriva Respimat version and we will stop the HandiHaler.  Continue Symbicort.  Going forward we may decide to transition him to [place]  We will continue Symbicort 2 puffs twice a day.  Rinse and gargle after using. We will continue Spiriva Respimat, 2 puffs once daily.  We will send a prescription for this medication to your pharmacy. Keep albuterol available to use either 1 nebulizer treatment or 2 puffs if needed for shortness of breath, chest tightness, wheezing. COVID-19 vaccine is up-to-date Follow with Dr [name] in 4 months or sooner if you have any problems    Baltazar Apo, MD, PhD 12/25/2019, 3:08 PM [facility] [phone] or if no answer [phone]

## 2019-12-25 NOTE — Patient Instructions (Addendum)
We will continue Symbicort 2 puffs twice a day.  Rinse and gargle after using. We will continue Spiriva Respimat, 2 puffs once daily.  We will send a prescription for this medication to your pharmacy. Keep albuterol available to use either 1 nebulizer treatment or 2 puffs if needed for shortness of breath, chest tightness, wheezing. COVID-19 vaccine is up-to-date Follow with Dr [name] in 4 months or sooner if you have any problems.

## 2019-12-25 NOTE — Assessment & Plan Note (Signed)
Significantly improved.  In retrospect his recent dyspnea sounds like most of an acute exacerbation.  His wheeze is much improved.  He likes the Spiriva Respimat version and we will stop the HandiHaler.  Continue Symbicort.  Going forward we may decide to transition him to [place]  We will continue Symbicort 2 puffs twice a day.  Rinse and gargle after using. We will continue Spiriva Respimat, 2 puffs once daily.  We will send a prescription for this medication to your pharmacy. Keep albuterol available to use either 1 nebulizer treatment or 2 puffs if needed for shortness of breath, chest tightness, wheezing. COVID-19 vaccine is up-to-date Follow with Dr [name] in 4 months or sooner if you have any problems

## 2019-12-29 ENCOUNTER — Telehealth: Payer: Self-pay | Admitting: Cardiology

## 2019-12-29 NOTE — Telephone Encounter (Signed)
Pt c/o swelling: STAT is pt has developed SOB within 24 hours  1) How much weight have you gained and in what time span? Unsure, states he stays between 180-188  2) If swelling, where is the swelling located? ankles  3) Are you currently taking a fluid pill? Yes, states it sometimes works and sometimes it does not.   4) Are you currently SOB? No but states he does get SOB  5) Do you have a log of your daily weights (if so, list)? States he has not been weighing himself.   6) Have you gained 3 pounds in a day or 5 pounds in a week? Unsure.   7) Have you traveled recently? No  Patient scheduled with Dr. Marlou Porch on 01/07/20. States he is willing to see anyone sooner if possible.    Please call both numbers if he does not answer one.

## 2019-12-30 NOTE — Telephone Encounter (Signed)
Called and spoke with patient on detail regarding the swelling he has been having in his legs.  Pt reports this has been going on for 2 to 3 months now and seems to be worse in the left leg than the right.  It does mostly resolve overnight and therefore he does not always take his Furosemide.   He reports some days the Furosemide seems to work and some days it does not.   He has not been weighing daily but feels his wt is fairly stable around 188 lbs.  He denies any increase in SOB.  Did have a cough d/t allergies earlier in the year but that has resolved since being treated by Dr [name].  He reports he has been watching his sodium intake and making an effort to keep it low.  Advised to elevate feet above the level of his heart as much as possible during the day, advised wearing knee high compression stockings and how they assist in returning blood flor to the heart and decreasing edema.  He is scheduled to see Dr Marlou Porch soon but would like to be called if there is a cancellation prior to his scheduled appt.  Reassured pt I will call if any appt become available.  Pt is aware I will forward this information to Dr Marlou Porch for his knowledge and will c/b if any new orders/recommendations.  Pt in agreement and expressed gratitude for the call and information.

## 2019-12-31 NOTE — Telephone Encounter (Signed)
Called pt to offer earlier appt with Dr Marlou Porch for Tuesday 6/1.  Pt states he will wait until Thursday for his currently scheduled appt.

## 2019-12-31 NOTE — Telephone Encounter (Signed)
Agree with plan Traycen Goyer, MD  

## 2020-01-01 NOTE — Progress Notes (Signed)
Cardiology Office Note:    Date:  01/05/2020   ID:  [name], DOB 11-14-1945, MRN [id number]  PCP:  [name], MD  Cardiologist:  Candee Furbish, MD  Electrophysiologist:  None   Referring MD: [name], MD     History of Present Illness:    Oscar Robinson is a 74 y.o. male here for the follow up of CAD post CABG 1999.  Last heart catheterization as below, LIMA patent.  COPD - [name]  LE edema, ankle edema bilaterally left greater than right.  Been experiencing this more.  At night after he wakes up, no real edema.  After keeping his feet down, edema develops.  Denies any chest pain.  Past Medical History:  Diagnosis Date   Anemia    Anxiety    Arthritis    CKD (chronic kidney disease), stage II    COPD (chronic obstructive pulmonary disease) ([place])    Coronary artery disease    a. s/p CABG 1999.   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   Procedure: DUPUYTREN CONTRACTURE RELEASE;  Surgeon: Cammie Sickle., MD;  Location: [place];  Service: Orthopedics;  Laterality: Right;  palm, ring and small fingers dupuytrens contracture release   HAND SURGERY     lt palm,ring finger,   HAND SURGERY Left    pinky finger   HERNIA REPAIR  1996   lt/rt ing hernia   LEFT HEART CATH AND CORS/GRAFTS ANGIOGRAPHY N/A 02/26/2018   Procedure: LEFT HEART CATH AND CORS/GRAFTS ANGIOGRAPHY;  Surgeon: Jettie Booze, MD;  Location: [facility];  Service: Cardiovascular;  Laterality: N/A;   LEFT HEART CATHETERIZATION WITH CORONARY/GRAFT  ANGIOGRAM N/A 06/15/2013   Procedure: LEFT HEART CATHETERIZATION WITH Beatrix Fetters;  Surgeon: Candee Furbish, MD;  Location: [facility];  Service: Cardiovascular;  Laterality: N/A;   UPPER GASTROINTESTINAL ENDOSCOPY      Current Medications: Current Meds  Medication Sig   albuterol (PROAIR HFA) 108 (90 Base) MCG/ACT inhaler Inhale 2 puffs into the lungs every 4 (four) hours as needed for wheezing or shortness of breath.   albuterol (PROVENTIL) (2.5 MG/3ML) 0.083% nebulizer solution Take 3 mLs (2.5 mg total) by nebulization every 6 (six) hours as needed for wheezing or shortness of breath.   ALPRAZolam (XANAX) 0.5 MG tablet Take 0.5 mg by mouth at bedtime.    Ascorbic Acid (VITAMIN C) 1000 MG tablet Take 1,000 mg by mouth daily.   aspirin 81 MG tablet Take 81 mg by mouth at bedtime.    Cholecalciferol (VITAMIN D3) 50 MCG (2000 UT) TABS Take 1 tablet by mouth daily.   Coenzyme Q10 (CO Q 10) 100 MG CAPS Take 100 mg by mouth at bedtime.    colesevelam (WELCHOL) 625 MG tablet TAKE 3 TABLETS BY MOUTH TWICE DAILY WITH MEALS   doxepin (SINEQUAN) 50 MG capsule Take 100 mg by mouth at bedtime.    doxycycline (VIBRA-TABS) 100 MG tablet Take 1 tablet (  100 mg total) by mouth 2 (two) times daily.   esomeprazole (NEXIUM) 20 MG capsule Take 20 mg by mouth 2 (two) times daily.   furosemide (LASIX) 20 MG tablet TAKE 1 TABLET BY MOUTH ONCE DAILY AS NEEDED FOR  ANKLE  SWELLING   HUMALOG 100 UNIT/ML injection USE VIA PUMP DAILY. MAX DAILY DOSE 150 UNITS PER DAY.   levothyroxine (SYNTHROID) 50 MCG tablet Take 50 mcg by mouth daily.   losartan (COZAAR) 100 MG tablet Take 1 tablet (100 mg total) by mouth daily.   metFORMIN (GLUCOPHAGE) 500 MG tablet Take 1 tablet (500 mg total) by mouth 2 (two) times daily with a meal.   metoprolol succinate (TOPROL-XL) 50 MG 24 hr tablet TAKE 1 TABLET BY MOUTH ONCE DAILY WITH A MEAL OR  IMMEDIATELY  FOLLOWING  A  MEAL   Multiple Vitamins-Minerals  (CENTRUM SILVER 50+MEN) TABS Take 1 tablet by mouth daily.   nitroGLYCERIN (NITROSTAT) 0.4 MG SL tablet Place 1 tablet (0.4 mg total) under the tongue every 5 (five) minutes as needed for chest pain.   Omega-3 Fatty Acids (FISH OIL) 600 MG CAPS Take 1,400 mg by mouth at bedtime.    pravastatin (PRAVACHOL) 40 MG tablet Take 1 tablet by mouth once daily   SYMBICORT 160-4.5 MCG/ACT inhaler INHALE 2 PUFFS BY MOUTH EVERY 12 HOURS, RINSE MOUTH AFTER EVERY USE.   Tiotropium Bromide Monohydrate (SPIRIVA RESPIMAT) 2.5 MCG/ACT AERS Inhale 2 puffs into the lungs daily.   Tiotropium Bromide Monohydrate (SPIRIVA RESPIMAT) 2.5 MCG/ACT AERS Inhale 2 puffs into the lungs daily.   [DISCONTINUED] amLODipine (NORVASC) 5 MG tablet Take 1 tablet (5 mg total) by mouth daily.   [DISCONTINUED] losartan (COZAAR) 50 MG tablet Take 1 tablet by mouth once daily   Current Facility-Administered Medications for the 01/05/20 encounter (Office Visit) with Jerline Pain, MD  Medication   0.9 %  sodium chloride infusion     Allergies:   Patient has no known allergies.   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  Tobacco Use   Smoking status: Former Smoker    Packs/day: 2.00    Years: 35.00    Pack years: 70.00    Types: Cigarettes    Quit date: 09/27/1997    Years since quitting: 22.2   Smokeless tobacco: Never Used  Substance and Sexual Activity   Alcohol use: Yes    Comment: occ   Drug use: No   Sexual activity: Not on file  Other Topics Concern   Not on file  Social History Narrative   Not on file   Social Determinants of Health   Financial Resource Strain:    Difficulty of Paying Living Expenses:   Food Insecurity:    Worried About Charity fundraiser  in the Last Year:    Arboriculturist in the Last Year:   Transportation Needs:    Film/video editor (Medical):    Lack of Transportation (Non-Medical):   Physical Activity:    Days of Exercise per Week:    Minutes of Exercise per Session:   Stress:    Feeling of Stress :   Social Connections:    Frequency of Communication with Friends and Family:  Frequency of Social Gatherings with Friends and Family:    Attends Religious Services:    Active Member of Clubs or Organizations:    Attends Archivist Meetings:    Marital Status:      Family History: The patient's family history includes Drug abuse in his brother. There is no history of Heart disease, Heart failure, Diabetes, Hypertension, Colon cancer, Esophageal cancer, Prostate cancer, Pancreatic cancer, Kidney disease, Liver disease, Stomach cancer, or Rectal cancer.  ROS:   Please see the history of present illness.     All other systems reviewed and are negative.  EKGs/Labs/Other Studies Reviewed:    The following studies were reviewed today: Cardiac catheterization 02/26/2018  Ost 1st Diag lesion is 75% stenosed. LIMA to diagonal is patent. Appears unchanged from prior cath.  Prox LAD lesion is 25% stenosed.  The left ventricular systolic function is normal.  LV end diastolic pressure is normal.  The left ventricular ejection fraction is 55-65% by visual estimate.  There is no aortic valve stenosis.  No change from prior cath.   EKG:  EKG is  ordered today.  The ekg ordered today demonstrates SR 76 NSSTW changes.   Recent Labs: 01/06/2019: ALT 17; BUN 15; Creatinine, Ser 1.16; Potassium 4.8; Sodium 138  Recent Lipid Panel    Component Value Date/Time   CHOL 127 06/05/2016 1055   TRIG 89 06/05/2016 1055   HDL 58 06/05/2016 1055   CHOLHDL 2.2 06/05/2016 1055   VLDL 18 06/05/2016 1055   LDLCALC 51 06/05/2016 1055    Physical Exam:    VS:  BP 134/80    Pulse 76    Ht 5\' 8"  (1.727  m)    Wt 182 lb (82.6 kg)    BMI 27.67 kg/m     Wt Readings from Last 3 Encounters:  01/05/20 182 lb (82.6 kg)  12/25/19 188 lb 3.2 oz (85.4 kg)  12/03/19 185 lb 3.2 oz (84 kg)     GEN:  Well nourished, well developed in no acute distress HEENT: Normal NECK: No JVD; No carotid bruits LYMPHATICS: No lymphadenopathy CARDIAC: RRR, no murmurs, rubs, gallops RESPIRATORY: Mild scattered wheeze right side greater than left ABDOMEN: Soft, non-tender, non-distended MUSCULOSKELETAL: 2-3+ pitting inner ankle edema; No deformity  SKIN: Warm and dry NEUROLOGIC:  Alert and oriented x 3 PSYCHIATRIC:  Normal affect   ASSESSMENT:    1. Atherosclerosis of native coronary artery of native heart without angina pectoris   2. Pure hypercholesterolemia   3. Essential hypertension   4. Type 2 diabetes mellitus without complication, with long-term current use of insulin ([place])   5. Medication management    PLAN:    In order of problems listed above:  Coronary disease post CABG -Catheterization from above July 2019 reviewed. Doing well. No CP.   Lower extremity edema -Dependent edema which is being exacerbated likely by amlodipine 5 mg as well as possible elevated right-sided heart pressures with his underlying COPD.  We will go ahead and stop the amlodipine and increase his losartan to 100 mg.  Checking blood work today.  Essential hypertension -Stable, medications reviewed. Medications noted.  Stopping amlodipine because of lower extremity edema and increasing losartan 100.  We could consider utilizing Lasix in the future if absolutely necessary.  He does have some urinary incontinence.  COPD with shortness of breath -[name] has been monitoring.  Medications reviewed. --Symbicort, Spiriva.  Reviewed his note. Seems to be doing well.   Diabetes with hyperlipidemia -  ALT 17, creatinine 1.16.  Prior LDL 70, checking labs today   Medication Adjustments/Labs and Tests Ordered: Current  medicines are reviewed at length with the patient today.  Concerns regarding medicines are outlined above.  Orders Placed This Encounter  Procedures   Comprehensive metabolic panel   CBC   Lipid panel   Hemoglobin A1c   EKG 12-Lead   Meds ordered this encounter  Medications   losartan (COZAAR) 100 MG tablet    Sig: Take 1 tablet (100 mg total) by mouth daily.    Dispense:  90 tablet    Refill:  3    Patient Instructions  Medication Instructions:  Please discontinue your Amlodipine. Increase Losartan to 100 mg a day. Continue all other medications as listed.  *If you need a refill on your cardiac medications before your next appointment, please call your pharmacy*  Lab Work: Please have blood work today (CBC, CMP, Lipid, A1C) If you have labs (blood work) drawn today and your tests are completely normal, you will receive your results only by:  MyChart Message (if you have MyChart) OR  A paper copy in the mail If you have any lab test that is abnormal or we need to change your treatment, we will call you to review the results.  Follow-Up: At [facility], you and your health needs are our priority.  As part of our continuing mission to provide you with exceptional heart care, we have created designated Provider Care Teams.  These Care Teams include your primary Cardiologist (physician) and Advanced Practice Providers (APPs -  Physician Assistants and Nurse Practitioners) who all work together to provide you with the care you need, when you need it.  We recommend signing up for the patient portal called "MyChart".  Sign up information is provided on this After Visit Summary.  MyChart is used to connect with patients for Virtual Visits (Telemedicine).  Patients are able to view lab/test results, encounter notes, upcoming appointments, etc.  Non-urgent messages can be sent to your provider as well.   To learn more about what you can do with MyChart, go to  [email or web address].    Your next appointment:   6 month(s)  The format for your next appointment:   In Person  Provider:   Candee Furbish, MD   Thank you for choosing [facility]!!        Signed, Candee Furbish, MD  01/05/2020 2:19 PM    [place]

## 2020-01-05 ENCOUNTER — Ambulatory Visit (INDEPENDENT_AMBULATORY_CARE_PROVIDER_SITE_OTHER): Payer: Medicare Other | Admitting: Cardiology

## 2020-01-05 ENCOUNTER — Other Ambulatory Visit: Payer: Self-pay

## 2020-01-05 ENCOUNTER — Encounter: Payer: Self-pay | Admitting: Cardiology

## 2020-01-05 VITALS — BP 134/80 | HR 76 | Ht 68.0 in | Wt 182.0 lb

## 2020-01-05 DIAGNOSIS — E7800 Pure hypercholesterolemia, unspecified: Secondary | ICD-10-CM

## 2020-01-05 DIAGNOSIS — I10 Essential (primary) hypertension: Secondary | ICD-10-CM

## 2020-01-05 DIAGNOSIS — I2510 Atherosclerotic heart disease of native coronary artery without angina pectoris: Secondary | ICD-10-CM

## 2020-01-05 DIAGNOSIS — Z79899 Other long term (current) drug therapy: Secondary | ICD-10-CM | POA: Diagnosis not present

## 2020-01-05 DIAGNOSIS — E119 Type 2 diabetes mellitus without complications: Secondary | ICD-10-CM | POA: Diagnosis not present

## 2020-01-05 DIAGNOSIS — Z794 Long term (current) use of insulin: Secondary | ICD-10-CM | POA: Diagnosis not present

## 2020-01-05 MED ORDER — LOSARTAN POTASSIUM 100 MG PO TABS: 100.0000 mg | ORAL_TABLET | Freq: Every day | ORAL | 3 refills | Status: DC

## 2020-01-05 NOTE — Patient Instructions (Signed)
Medication Instructions:  Please discontinue your Amlodipine. Increase Losartan to 100 mg a day. Continue all other medications as listed.  *If you need a refill on your cardiac medications before your next appointment, please call your pharmacy*  Lab Work: Please have blood work today (CBC, CMP, Lipid, A1C) If you have labs (blood work) drawn today and your tests are completely normal, you will receive your results only by: Marland Kitchen MyChart Message (if you have MyChart) OR . A paper copy in the mail If you have any lab test that is abnormal or we need to change your treatment, we will call you to review the results.  Follow-Up: At [facility], you and your health needs are our priority.  As part of our continuing mission to provide you with exceptional heart care, we have created designated Provider Care Teams.  These Care Teams include your primary Cardiologist (physician) and Advanced Practice Providers (APPs -  Physician Assistants and Nurse Practitioners) who all work together to provide you with the care you need, when you need it.  We recommend signing up for the patient portal called "MyChart".  Sign up information is provided on this After Visit Summary.  MyChart is used to connect with patients for Virtual Visits (Telemedicine).  Patients are able to view lab/test results, encounter notes, upcoming appointments, etc.  Non-urgent messages can be sent to your provider as well.   To learn more about what you can do with MyChart, go to [email or web address].    Your next appointment:   6 month(s)  The format for your next appointment:   In Person  Provider:   Candee Furbish, MD   Thank you for choosing [facility]!!

## 2020-01-06 LAB — COMPREHENSIVE METABOLIC PANEL
ALT: 30 IU/L (ref 0–44)
AST: 28 IU/L (ref 0–40)
Albumin/Globulin Ratio: 1.8 (ref 1.2–2.2)
Albumin: 4.2 g/dL (ref 3.7–4.7)
Alkaline Phosphatase: 77 IU/L (ref 48–121)
BUN/Creatinine Ratio: 11 (ref 10–24)
BUN: 13 mg/dL (ref 8–27)
Bilirubin Total: <0.2 mg/dL (ref 0.0–1.2)
CO2: 23 mmol/L (ref 20–29)
Calcium: 9.4 mg/dL (ref 8.6–10.2)
Chloride: 102 mmol/L (ref 96–106)
Creatinine, Ser: 1.19 mg/dL (ref 0.76–1.27)
GFR calc Af Amer: 70 mL/min/{1.73_m2} (ref >59)
GFR calc non Af Amer: 61 mL/min/{1.73_m2} (ref >59)
Globulin, Total: 2.3 g/dL (ref 1.5–4.5)
Glucose: 156 mg/dL — ABNORMAL HIGH (ref 65–99)
Potassium: 4.5 mmol/L (ref 3.5–5.2)
Sodium: 139 mmol/L (ref 134–144)
Total Protein: 6.5 g/dL (ref 6.0–8.5)

## 2020-01-06 LAB — CBC
Hematocrit: 40.2 % (ref 37.5–51.0)
Hemoglobin: 13.4 g/dL (ref 13.0–17.7)
MCH: 30.7 pg (ref 26.6–33.0)
MCHC: 33.3 g/dL (ref 31.5–35.7)
MCV: 92 fL (ref 79–97)
Platelets: 222 10*3/uL (ref 150–450)
RBC: 4.36 x10E6/uL (ref 4.14–5.80)
RDW: 12.6 % (ref 11.6–15.4)
WBC: 7.2 10*3/uL (ref 3.4–10.8)

## 2020-01-06 LAB — LIPID PANEL
Chol/HDL Ratio: 2.4 ratio (ref 0.0–5.0)
Cholesterol, Total: 135 mg/dL (ref 100–199)
HDL: 56 mg/dL (ref >39)
LDL Chol Calc (NIH): 58 mg/dL (ref 0–99)
Triglycerides: 119 mg/dL (ref 0–149)
VLDL Cholesterol Cal: 21 mg/dL (ref 5–40)

## 2020-01-06 LAB — HEMOGLOBIN A1C
Est. average glucose Bld gHb Est-mCnc: 189 mg/dL
Hgb A1c MFr Bld: 8.2 % — ABNORMAL HIGH (ref 4.8–5.6)

## 2020-01-07 ENCOUNTER — Ambulatory Visit: Payer: Medicare Other | Admitting: Cardiology

## 2020-01-24 ENCOUNTER — Other Ambulatory Visit: Payer: Self-pay | Admitting: Cardiology

## 2020-01-26 DIAGNOSIS — J441 Chronic obstructive pulmonary disease with (acute) exacerbation: Secondary | ICD-10-CM | POA: Diagnosis not present

## 2020-01-30 ENCOUNTER — Other Ambulatory Visit: Payer: Self-pay | Admitting: Cardiology

## 2020-02-04 ENCOUNTER — Telehealth: Payer: Self-pay | Admitting: Emergency Medicine

## 2020-02-04 ENCOUNTER — Other Ambulatory Visit: Payer: Self-pay | Admitting: Emergency Medicine

## 2020-02-04 MED ORDER — SPIRIVA RESPIMAT 2.5 MCG/ACT IN AERS: 2.0000 | INHALATION_SPRAY | Freq: Every day | RESPIRATORY_TRACT | 5 refills | Status: DC

## 2020-02-04 MED ORDER — SYMBICORT 160-4.5 MCG/ACT IN AERO: INHALATION_SPRAY | RESPIRATORY_TRACT | 5 refills | Status: DC

## 2020-02-04 NOTE — Telephone Encounter (Signed)
Called and informed patient that refills for Symbicort and Spiriva were sent to preferred pharmacy.

## 2020-02-15 DIAGNOSIS — E119 Type 2 diabetes mellitus without complications: Secondary | ICD-10-CM | POA: Diagnosis not present

## 2020-02-15 DIAGNOSIS — H25813 Combined forms of age-related cataract, bilateral: Secondary | ICD-10-CM | POA: Diagnosis not present

## 2020-02-18 DIAGNOSIS — L851 Acquired keratosis [keratoderma] palmaris et plantaris: Secondary | ICD-10-CM | POA: Diagnosis not present

## 2020-02-18 DIAGNOSIS — E119 Type 2 diabetes mellitus without complications: Secondary | ICD-10-CM | POA: Diagnosis not present

## 2020-02-19 DIAGNOSIS — R0602 Shortness of breath: Secondary | ICD-10-CM | POA: Diagnosis not present

## 2020-02-19 DIAGNOSIS — Z79899 Other long term (current) drug therapy: Secondary | ICD-10-CM | POA: Diagnosis not present

## 2020-02-19 DIAGNOSIS — R600 Localized edema: Secondary | ICD-10-CM | POA: Diagnosis not present

## 2020-02-19 DIAGNOSIS — I10 Essential (primary) hypertension: Secondary | ICD-10-CM | POA: Diagnosis not present

## 2020-02-24 ENCOUNTER — Other Ambulatory Visit: Payer: Self-pay | Admitting: Cardiology

## 2020-03-04 DIAGNOSIS — I10 Essential (primary) hypertension: Secondary | ICD-10-CM | POA: Diagnosis not present

## 2020-03-04 DIAGNOSIS — Z79899 Other long term (current) drug therapy: Secondary | ICD-10-CM | POA: Diagnosis not present

## 2020-03-31 DIAGNOSIS — S90222A Contusion of left lesser toe(s) with damage to nail, initial encounter: Secondary | ICD-10-CM | POA: Diagnosis not present

## 2020-03-31 DIAGNOSIS — S92415A Nondisplaced fracture of proximal phalanx of left great toe, initial encounter for closed fracture: Secondary | ICD-10-CM | POA: Diagnosis not present

## 2020-03-31 DIAGNOSIS — M79675 Pain in left toe(s): Secondary | ICD-10-CM | POA: Diagnosis not present

## 2020-04-04 DIAGNOSIS — I10 Essential (primary) hypertension: Secondary | ICD-10-CM | POA: Diagnosis not present

## 2020-04-04 DIAGNOSIS — Z79899 Other long term (current) drug therapy: Secondary | ICD-10-CM | POA: Diagnosis not present

## 2020-04-05 DIAGNOSIS — S90212A Contusion of left great toe with damage to nail, initial encounter: Secondary | ICD-10-CM | POA: Diagnosis not present

## 2020-04-05 DIAGNOSIS — S92415A Nondisplaced fracture of proximal phalanx of left great toe, initial encounter for closed fracture: Secondary | ICD-10-CM | POA: Diagnosis not present

## 2020-04-05 DIAGNOSIS — E119 Type 2 diabetes mellitus without complications: Secondary | ICD-10-CM | POA: Diagnosis not present

## 2020-04-12 DIAGNOSIS — S90212D Contusion of left great toe with damage to nail, subsequent encounter: Secondary | ICD-10-CM | POA: Diagnosis not present

## 2020-04-12 DIAGNOSIS — S92415D Nondisplaced fracture of proximal phalanx of left great toe, subsequent encounter for fracture with routine healing: Secondary | ICD-10-CM | POA: Diagnosis not present

## 2020-04-26 ENCOUNTER — Other Ambulatory Visit: Payer: Self-pay

## 2020-04-26 ENCOUNTER — Ambulatory Visit (INDEPENDENT_AMBULATORY_CARE_PROVIDER_SITE_OTHER): Payer: Medicare Other

## 2020-04-26 DIAGNOSIS — E02 Subclinical iodine-deficiency hypothyroidism: Secondary | ICD-10-CM | POA: Diagnosis not present

## 2020-04-26 DIAGNOSIS — I10 Essential (primary) hypertension: Secondary | ICD-10-CM | POA: Diagnosis not present

## 2020-04-26 DIAGNOSIS — Z23 Encounter for immunization: Secondary | ICD-10-CM | POA: Diagnosis not present

## 2020-04-26 DIAGNOSIS — Z9641 Presence of insulin pump (external) (internal): Secondary | ICD-10-CM | POA: Diagnosis not present

## 2020-04-26 DIAGNOSIS — E7800 Pure hypercholesterolemia, unspecified: Secondary | ICD-10-CM | POA: Diagnosis not present

## 2020-04-26 DIAGNOSIS — E049 Nontoxic goiter, unspecified: Secondary | ICD-10-CM | POA: Diagnosis not present

## 2020-04-26 DIAGNOSIS — E1165 Type 2 diabetes mellitus with hyperglycemia: Secondary | ICD-10-CM | POA: Diagnosis not present

## 2020-04-29 ENCOUNTER — Telehealth: Payer: Self-pay | Admitting: Cardiology

## 2020-04-29 NOTE — Telephone Encounter (Signed)
New Message   Pt c/o BP issue:  1. What are your last 5 BP readings? 164/96, 145/89 2. Are you having any other symptoms (ex. Dizziness, headache, blurred vision, passed out)? Headaches periodically  3. What is your medication issue? No medication issues, but patient has doubled up on his metoprolol in the evenings. Patient states that he can tell when his BP is going up because his face starts to feel warm and burning.   I have scheduled the patient to see Richardson Dopp 9/27

## 2020-04-29 NOTE — Telephone Encounter (Signed)
Spoke with patient who is reporting he has noticed his BP being elevated over the "last couple of months."  About 1 week ago his BP was elevated (171/110 HR 73) and he felt like his face got real hot.  Last night his BP was elevated so he decided to take extra Metoprolol 50 mg.  This AM BP was 164/96 HR 70.  This afternoon 145/89.   He reports he is still taking his Losartan 100 mg and has been taking Furosemide as ordered as well.  Advised to use caution with changing medications on his own and to monitor his VS closely.  Advised to keep a log of them through the weekend and make sure to bring it to his appt on Monday with Scott.  Also advised pt to change the batteries in his BP monitor as it can led to false reading and he does admit it has been quite a while since they have been changed.  Pt is in agreement and will call on-call if needed over the weekend.

## 2020-05-01 NOTE — Progress Notes (Signed)
Cardiology Office Note:    Date:  05/02/2020   ID:  Oscar Robinson, DOB 24-Sep-1945, MRN [id number]  PCP:  [name], MD  [facility] Cardiologist:  Candee Furbish, MD   [facility] Electrophysiologist:  None   Referring MD: [name], MD   Chief Complaint:  Leg Swelling, Hypertension (uncontrolled), and Shortness of Breath    Patient Profile:    Oscar Robinson is a 74 y.o. male with:   Coronary artery disease   S/p CABG in 1999  Cath 7/19: patent L-LAD  COPD  Leg swelling  Amlodipine DCd in 6/21  Chronic kidney disease   Diabetes mellitus   Hypertension   Hyperlipidemia   OSA   Hypothyroidism   Prior CV studies: Cardiac catheterization 2018/03/22 LAD prox 25; D1 ost 75 L-LAD patent  EF 55-65  Echocardiogram 01/28/18 Mild LVH, EF 55-65, no RWMA, trivial MR, normal RVSF, PASP 23  CPET 12/2/216 Conclusion: Exercise testing with gas exchange demonstrates a  significant functional impairment of atleast moderate severity  when compared to matched sedentary  norms. Pre-exercise spirometry demonstrates evidence of  severe obstruction. At peak exercise, the patient is severely  ventilatory limited due to obstructive lung physiology.    History of Present Illness:    [name] was last seen by Dr. Marlou Porch in 6/21.  His Amlodipine was DC'd 2/2 leg swelling.  His Losartan was increased.  He called in recently with uncontrolled HTN and is seen for further evaluation.  He has chronic shortness of breath related to COPD.  He feels his breathing has gotten worse.  He has not had orthopnea, syncope.  He has noted leg swelling over the past few months.  This is new for him.  Stopping Amlodipine did not improve his swelling.  He broke a toe on his L foot and is in an open toed orthopedic shoe today.  He does not keep his legs elevated.  He is not sure if elevation helps swelling.  He has not had chest pain.  He limits salt in his diet.        Past Medical  History:  Diagnosis Date  . Anemia   . Anxiety   . Arthritis   . CKD (chronic kidney disease), stage II   . COPD (chronic obstructive pulmonary disease) ([place])   . Coronary artery disease    a. s/p CABG 1999.  . Depression   . Diabetes mellitus   . Diverticulosis   . GERD (gastroesophageal reflux disease)   . Hiatal hernia   . Hypercholesteremia   . Hypertension   . Sleep apnea    had sleep study and negative per pt  . Thyroid disease   . Tobacco abuse   . Tubular adenoma of colon     Current Medications: Current Meds  Medication Sig  . albuterol (PROAIR HFA) 108 (90 Base) MCG/ACT inhaler Inhale 2 puffs into the lungs every 4 (four) hours as needed for wheezing or shortness of breath.  Marland Kitchen albuterol (PROVENTIL) (2.5 MG/3ML) 0.083% nebulizer solution Take 3 mLs (2.5 mg total) by nebulization every 6 (six) hours as needed for wheezing or shortness of breath.  . ALPRAZolam (XANAX) 0.5 MG tablet Take 0.5 mg by mouth at bedtime.   Marland Kitchen antiseptic oral rinse (BIOTENE) LIQD 15 mLs by Mouth Rinse route daily.  . Ascorbic Acid (VITAMIN C) 1000 MG tablet Take 1,000 mg by mouth daily.  Marland Kitchen aspirin 81 MG tablet Take 81 mg by mouth at bedtime.   Marland Kitchen  Cholecalciferol (VITAMIN D3) 50 MCG (2000 UT) TABS Take 1 tablet by mouth daily.  . Coenzyme Q10 (CO Q 10) 100 MG CAPS Take 100 mg by mouth at bedtime.   . colesevelam (WELCHOL) 625 MG tablet TAKE 3 TABLETS BY MOUTH TWICE DAILY WITH MEALS  . doxepin (SINEQUAN) 50 MG capsule Take 100 mg by mouth at bedtime.   Marland Kitchen esomeprazole (NEXIUM) 20 MG capsule Take 20 mg by mouth 2 (two) times daily.  . furosemide (LASIX) 20 MG tablet TAKE 1 TABLET BY MOUTH ONCE DAILY AS NEEDED FOR  ANKLE  SWELLING  . HUMALOG 100 UNIT/ML injection USE VIA PUMP DAILY. MAX DAILY DOSE 150 UNITS PER DAY.  Marland Kitchen levothyroxine (SYNTHROID) 75 MCG tablet Take 75 mcg by mouth every morning.  Marland Kitchen losartan (COZAAR) 100 MG tablet Take 1 tablet (100 mg total) by mouth daily.  . metFORMIN (GLUCOPHAGE)  500 MG tablet Take 1 tablet (500 mg total) by mouth 2 (two) times daily with a meal.  . metoprolol succinate (TOPROL-XL) 50 MG 24 hr tablet TAKE 1 TABLET BY MOUTH ONCE DAILY WITH A MEAL OR IMMEDIATELY FOLLOWING A MEAL.  . Multiple Vitamins-Minerals (CENTRUM SILVER 50+MEN) TABS Take 1 tablet by mouth daily.  . nitroGLYCERIN (NITROSTAT) 0.4 MG SL tablet Place 1 tablet (0.4 mg total) under the tongue every 5 (five) minutes as needed for chest pain.  . Omega-3 Fatty Acids (FISH OIL) 600 MG CAPS Take 1,400 mg by mouth at bedtime.   . pravastatin (PRAVACHOL) 40 MG tablet Take 1 tablet by mouth once daily  . SYMBICORT 160-4.5 MCG/ACT inhaler INHALE 2 PUFFS BY MOUTH EVERY 12 HOURS, RINSE MOUTH AFTER EVERY USE.  . Tiotropium Bromide Monohydrate (SPIRIVA RESPIMAT) 2.5 MCG/ACT AERS Inhale 2 puffs into the lungs daily.  . Tiotropium Bromide Monohydrate (SPIRIVA RESPIMAT) 2.5 MCG/ACT AERS Inhale 2 puffs into the lungs daily.   Current Facility-Administered Medications for the 05/02/20 encounter (Office Visit) with Richardson Dopp T, PA-C  Medication  . 0.9 %  sodium chloride infusion     Allergies:   Patient has no known allergies.   Social History   Tobacco Use  . Smoking status: Former Smoker    Packs/day: 2.00    Years: 35.00    Pack years: 70.00    Types: Cigarettes    Quit date: 09/27/1997    Years since quitting: 22.6  . Smokeless tobacco: Never Used  Substance Use Topics  . Alcohol use: Yes    Comment: occ  . Drug use: No     Family Hx: The patient's family history includes Drug abuse in his brother. There is no history of Heart disease, Heart failure, Diabetes, Hypertension, Colon cancer, Esophageal cancer, Prostate cancer, Pancreatic cancer, Kidney disease, Liver disease, Stomach cancer, or Rectal cancer.  Review of Systems  Constitutional: Negative for fever.  Respiratory: Negative for cough.   Gastrointestinal: Negative for diarrhea, hematochezia, melena and vomiting.    Genitourinary: Negative for hematuria.     EKGs/Labs/Other Test Reviewed:    EKG:  EKG is   ordered today.  The ekg ordered today demonstrates normal sinus rhythm, HR 82, normal axis, TW inversions in 2, 3, aVF, V4-6 (?LVH with repol), QTc 439  Recent Labs: 01/05/2020: ALT 30; BUN 13; Creatinine, Ser 1.19; Hemoglobin 13.4; Platelets 222; Potassium 4.5; Sodium 139   Recent Lipid Panel Lab Results  Component Value Date/Time   CHOL 135 01/05/2020 10:45 AM   TRIG 119 01/05/2020 10:45 AM   HDL 56 01/05/2020 10:45 AM  CHOLHDL 2.4 01/05/2020 10:45 AM   CHOLHDL 2.2 06/05/2016 10:55 AM   LDLCALC 58 01/05/2020 10:45 AM    Physical Exam:    VS:  BP (!) 144/82 Comment: pts. home monitor here in office 151/99 at 12:18 pm  Pulse 81   Ht 5\' 8"  (1.727 m)   Wt 185 lb 3.2 oz (84 kg)   SpO2 97%   BMI 28.16 kg/m     Wt Readings from Last 3 Encounters:  05/02/20 185 lb 3.2 oz (84 kg)  01/05/20 182 lb (82.6 kg)  12/25/19 188 lb 3.2 oz (85.4 kg)     Constitutional:      Appearance: Healthy appearance. Not in distress.  Neck:     Thyroid: No thyromegaly.     Vascular: JVD normal.  Pulmonary:     Effort: Pulmonary effort is normal.     Breath sounds: No wheezing. No rales.  Cardiovascular:     Normal rate. Regular rhythm. Normal S1. Normal S2.     Murmurs: There is no murmur.  Edema:    Pretibial: bilateral trace edema of the pretibial area.    Ankle: bilateral 1+ edema of the ankle. Abdominal:     Palpations: Abdomen is soft. There is no hepatomegaly.  Skin:    General: Skin is warm and dry.  Neurological:     General: No focal deficit present.     Mental Status: Alert and oriented to person, place and time.     Cranial Nerves: Cranial nerves are intact.       ASSESSMENT & PLAN:    1. Coronary artery disease involving native coronary artery of native heart without angina pectoris 2. Shortness of breath 3. Leg swelling  S/p CABG in 1999.  Cath in 2019 with patent L-LAD.  He  has chronic shortness of breath related to COPD.  He feels his shortness of breath has worsened.  His leg swelling is also new for him.  He has bilateral swelling.  He has changes on his EKG today with inf-lat TW inversions.  However, this has a questionable appearance of LVH with repol abnormality as well.  His BP at home has been in the 160s/110s.  He has not had chest pain.  I will adjust his medications for his BP as noted below.  I will also obtain a Lexiscan Myoview to rule out ischemia and obtain a follow up echocardiogram to assess LVF, RVF, diastolic function and pulmonary pressures.  FU with Dr. Marlou Porch or me after testing is complete.   4. Essential hypertension BP is above goal.  His BP in the office is somewhat better than his BP on his machine at home.  His BP cuff is s/w old.  I have asked him to get a new one and keep track of his BP over the next couple of weeks.  He will send those to me for review. Start HCTZ 12.5 mg once daily.  BMET in 1 week.  Consider increasing HCTZ to 25 mg once daily if he does not reach goal.    5. CKD (chronic kidney disease) stage 2, GFR 60-89 ml/min Obtain FU BMET in 1 week.   6. Hyperlipidemia, unspecified hyperlipidemia type LDL optimal on most recent lab work.  Continue current Rx.      Dispo:  Return in about 4 weeks (around 05/30/2020) for Follow up after testing w/ Dr. Marlou Porch, or Richardson Dopp, PA-C, in person.   Medication Adjustments/Labs and Tests Ordered: Current medicines are reviewed at  length with the patient today.  Concerns regarding medicines are outlined above.  Tests Ordered: Orders Placed This Encounter  Procedures  . Basic metabolic panel  . MYOCARDIAL PERFUSION IMAGING  . EKG 12-Lead  . ECHOCARDIOGRAM COMPLETE   Medication Changes: Meds ordered this encounter  Medications  . hydrochlorothiazide (HYDRODIURIL) 25 MG tablet    Sig: Take 0.5 tablets (12.5 mg total) by mouth daily.    Dispense:  45 tablet    Refill:  3     Signed, Richardson Dopp, PA-C  05/02/2020 1:16 PM    [facility] [address], [place], [place]  [address] Phone: [phone]; Fax: [phone]

## 2020-05-02 ENCOUNTER — Other Ambulatory Visit: Payer: Self-pay

## 2020-05-02 ENCOUNTER — Encounter: Payer: Self-pay | Admitting: Physician Assistant

## 2020-05-02 ENCOUNTER — Ambulatory Visit (INDEPENDENT_AMBULATORY_CARE_PROVIDER_SITE_OTHER): Payer: Medicare Other | Admitting: Physician Assistant

## 2020-05-02 VITALS — BP 144/82 | HR 81 | Ht 68.0 in | Wt 185.2 lb

## 2020-05-02 DIAGNOSIS — N182 Chronic kidney disease, stage 2 (mild): Secondary | ICD-10-CM

## 2020-05-02 DIAGNOSIS — I10 Essential (primary) hypertension: Secondary | ICD-10-CM | POA: Diagnosis not present

## 2020-05-02 DIAGNOSIS — M7989 Other specified soft tissue disorders: Secondary | ICD-10-CM

## 2020-05-02 DIAGNOSIS — E785 Hyperlipidemia, unspecified: Secondary | ICD-10-CM

## 2020-05-02 DIAGNOSIS — I2510 Atherosclerotic heart disease of native coronary artery without angina pectoris: Secondary | ICD-10-CM | POA: Diagnosis not present

## 2020-05-02 DIAGNOSIS — R0602 Shortness of breath: Secondary | ICD-10-CM

## 2020-05-02 MED ORDER — HYDROCHLOROTHIAZIDE 25 MG PO TABS: 12.5000 mg | ORAL_TABLET | Freq: Every day | ORAL | 3 refills | Status: DC

## 2020-05-02 NOTE — Patient Instructions (Addendum)
Medication Instructions:  Your physician has recommended you make the following change in your medication:   1) Start HCTZ 25 mg, 0.5 tablet by mouth once a day 2) Call the office if you have to take Furosemide more than 2 days in a row  *If you need a refill on your cardiac medications before your next appointment, please call your pharmacy*  Lab Work: Your physician recommends that you return for lab work in 1 week for a BMET  Testing/Procedures: Your physician has requested that you have an echocardiogram. Echocardiography is a painless test that uses sound waves to create images of your heart. It provides your doctor with information about the size and shape of your heart and how well your heart's chambers and valves are working. This procedure takes approximately one hour. There are no restrictions for this procedure.  Your physician has requested that you have a lexiscan myoview. For further information please visit [email or web address]. Please follow instruction sheet, as given.  Follow-Up: At [facility], you and your health needs are our priority.  As part of our continuing mission to provide you with exceptional heart care, we have created designated Provider Care Teams.  These Care Teams include your primary Cardiologist (physician) and Advanced Practice Providers (APPs -  Physician Assistants and Nurse Practitioners) who all work together to provide you with the care you need, when you need it.  Your next appointment:   3-4 week(s)  The format for your next appointment:   In Person  Provider:   Candee Furbish, MD or Richardson Dopp, PA-C   Other Instructions Get a new blood pressure cuff (Omron). Check blood pressure 1-2 times a day for 1-2 weeks and send through my chart or call with readings.

## 2020-05-10 DIAGNOSIS — A084 Viral intestinal infection, unspecified: Secondary | ICD-10-CM | POA: Diagnosis not present

## 2020-05-12 DIAGNOSIS — S51802A Unspecified open wound of left forearm, initial encounter: Secondary | ICD-10-CM | POA: Diagnosis not present

## 2020-05-13 ENCOUNTER — Other Ambulatory Visit: Payer: Self-pay

## 2020-05-13 ENCOUNTER — Other Ambulatory Visit: Payer: Medicare Other | Admitting: *Deleted

## 2020-05-13 DIAGNOSIS — I10 Essential (primary) hypertension: Secondary | ICD-10-CM

## 2020-05-13 DIAGNOSIS — N182 Chronic kidney disease, stage 2 (mild): Secondary | ICD-10-CM

## 2020-05-13 LAB — BASIC METABOLIC PANEL
BUN/Creatinine Ratio: 10 (ref 10–24)
BUN: 12 mg/dL (ref 8–27)
CO2: 25 mmol/L (ref 20–29)
Calcium: 9.1 mg/dL (ref 8.6–10.2)
Chloride: 103 mmol/L (ref 96–106)
Creatinine, Ser: 1.18 mg/dL (ref 0.76–1.27)
GFR calc Af Amer: 70 mL/min/{1.73_m2} (ref >59)
GFR calc non Af Amer: 61 mL/min/{1.73_m2} (ref >59)
Glucose: 64 mg/dL — ABNORMAL LOW (ref 65–99)
Potassium: 3.9 mmol/L (ref 3.5–5.2)
Sodium: 141 mmol/L (ref 134–144)

## 2020-05-19 ENCOUNTER — Encounter (HOSPITAL_COMMUNITY): Payer: Medicare Other

## 2020-05-19 ENCOUNTER — Telehealth (HOSPITAL_COMMUNITY): Payer: Self-pay

## 2020-05-19 ENCOUNTER — Other Ambulatory Visit (HOSPITAL_COMMUNITY): Payer: Medicare Other

## 2020-05-19 NOTE — Telephone Encounter (Signed)
Spoke with the patient, detailed instructions given. He stated that he would be here for his test. Asked to call back with any questions. [name] EMTP 

## 2020-05-24 ENCOUNTER — Other Ambulatory Visit: Payer: Self-pay

## 2020-05-24 ENCOUNTER — Ambulatory Visit (HOSPITAL_COMMUNITY): Payer: Medicare Other | Attending: Cardiovascular Disease

## 2020-05-24 ENCOUNTER — Encounter: Payer: Self-pay | Admitting: Physician Assistant

## 2020-05-24 ENCOUNTER — Ambulatory Visit (HOSPITAL_BASED_OUTPATIENT_CLINIC_OR_DEPARTMENT_OTHER): Payer: Medicare Other

## 2020-05-24 DIAGNOSIS — R0602 Shortness of breath: Secondary | ICD-10-CM | POA: Diagnosis not present

## 2020-05-24 DIAGNOSIS — I2510 Atherosclerotic heart disease of native coronary artery without angina pectoris: Secondary | ICD-10-CM | POA: Diagnosis not present

## 2020-05-24 LAB — MYOCARDIAL PERFUSION IMAGING
LV dias vol: 66 mL (ref 62–150)
LV sys vol: 26 mL
Peak HR: 84 {beats}/min
Rest HR: 71 {beats}/min
SDS: 0
SRS: 0
SSS: 0
TID: 1.01

## 2020-05-24 LAB — ECHOCARDIOGRAM COMPLETE
Area-P 1/2: 3.06 cm2
Height: 68 in
S' Lateral: 2.8 cm
Weight: 2960 oz

## 2020-05-24 MED ORDER — TECHNETIUM TC 99M TETROFOSMIN IV KIT
11.0000 | PACK | Freq: Once | INTRAVENOUS | Status: AC | PRN
  Administered 2020-05-24: 11 via INTRAVENOUS
  Filled 2020-05-24: qty 11

## 2020-05-24 MED ORDER — TECHNETIUM TC 99M TETROFOSMIN IV KIT
31.4000 | PACK | Freq: Once | INTRAVENOUS | Status: AC | PRN
  Administered 2020-05-24: 31.4 via INTRAVENOUS
  Filled 2020-05-24: qty 32

## 2020-05-24 MED ORDER — REGADENOSON 0.4 MG/5ML IV SOLN
0.4000 mg | Freq: Once | INTRAVENOUS | Status: AC
  Administered 2020-05-24: 0.4 mg via INTRAVENOUS

## 2020-05-27 ENCOUNTER — Telehealth: Payer: Self-pay | Admitting: *Deleted

## 2020-05-27 NOTE — Telephone Encounter (Signed)
Pt brought in a list of most recent BPs Thursday 151/101 HR 79 1 pm 151/90 9:30 pm Friday 150/93 HR 84 12:10 pm 123/72 HR 80 11:30 pm Saturday 139/83 HR 98 12 N Monday 138/85 HR 82 11:30 am Tuesday 155/93 HR 85 10 pm Wed 150/81 HR 93 1 AM 164/96 HR 80 1 PM Thursday 158/93 HR 80 11 AM 145/77 HR 75 8 PM 124/65 HR 77 11:59 PM Friday 152/90 HR 73 10:30 AM  These results have been reviewed by Dr Marlou Porch who gives order to increase HCTZ to 25 mg daily.  Pt is aware and will increase as instructed.  He reports he does not need a new RX at this time.  He will continue to monitor his BP until the time of his f/u appt with Margaret Pyle 10/29 at 11:15 am.  Pt will c/b before appt should he have any questions or concerns.

## 2020-06-02 NOTE — Progress Notes (Signed)
Cardiology Office Note:    Date:  06/03/2020   ID:  [name], DOB 05-04-1946, MRN [id number]  PCP:  [name], MD  [facility] Cardiologist:  Candee Furbish, MD   [facility] Electrophysiologist:  None   Referring MD: [name], MD   Chief Complaint:  Follow-up (BP, Dyspnea)    Patient Profile:    Oscar Robinson is a 74 y.o. male with:   Coronary artery disease  ? S/p CABG in 1999 ? Cath 7/19: patent L-LAD  COPD  Leg swelling ? Amlodipine DCd in 6/21  Chronic kidney disease   Diabetes mellitus   Hypertension   Hyperlipidemia   OSA   Hypothyroidism   Prior CV studies: Echocardiogram 05/24/20 EF 60-65, no RWMA, Gr 1 DD, normal RVSF, normal PASP  Myoview 05/24/20 Normal perfusion. LVEF 62% with normal wall motion. This is a low risk study. Compared to a prior study in 2014, ischemia is no longer present.  Cardiac catheterization 02/26/18 LAD prox 25; D1 ost 75 L-LAD patent  EF 55-65  Echocardiogram 01/28/18 Mild LVH, EF 55-65, no RWMA, trivial MR, normal RVSF, PASP 23  CPET 12/2/216 Conclusion: Exercise testing with gas exchange demonstrates a  significant functional impairment of atleast moderate severity  when compared to matched sedentary  norms. Pre-exercise spirometry demonstrates evidence of  severe obstruction. At peak exercise, the patient is severely  ventilatory limited due to obstructive lung physiology.   History of Present Illness:    Oscar Robinson was last seen 05/02/20 for uncontrolled BP.  I placed him on HCTZ and asked him to monitor his BP further.  The patient has chronic shortness of breath and felt his symptoms were getting worse.  I had him undergo stress testing which was low risk and an echocardiogram which demonstrated normal EF and mild diastolic dysfunction.  Since last seen, he contacted the office with continued elevated blood pressures.  His HCTZ was increased from 12.5 to 25 mg daily.  He returns for  follow up.  He is here alone.  He has not had chest discomfort.  His breathing is improved.  He is back to exercising and walking on a treadmill 5 days a week.  He has not had significant leg swelling.  He has not had orthopnea or syncope.  He notes his blood pressures at home have been optimal since his HCTZ was increased.  Past Medical History:  Diagnosis Date  . Anemia   . Anxiety   . Arthritis   . CKD (chronic kidney disease), stage II   . COPD (chronic obstructive pulmonary disease) ([place])   . Coronary artery disease    a. s/p CABG 1999.  . Depression   . Diabetes mellitus   . Diverticulosis   . GERD (gastroesophageal reflux disease)   . Hiatal hernia   . History of echocardiogram    Echocardiogram 10/21: EF 60-65, no RWMA, Gr 1 DD, normal RVSF, RVSP 15.2   . History of nuclear stress test    Myoview 10/21: EF 62, normal perfusion; low risk   . Hypercholesteremia   . Hypertension   . Sleep apnea    had sleep study and negative per pt  . Thyroid disease   . Tobacco abuse   . Tubular adenoma of colon     Current Medications: Current Meds  Medication Sig  . albuterol (PROAIR HFA) 108 (90 Base) MCG/ACT inhaler Inhale 2 puffs into the lungs every 4 (four) hours as needed for wheezing  or shortness of breath.  Marland Kitchen albuterol (PROVENTIL) (2.5 MG/3ML) 0.083% nebulizer solution Take 3 mLs (2.5 mg total) by nebulization every 6 (six) hours as needed for wheezing or shortness of breath.  . ALPRAZolam (XANAX) 0.5 MG tablet Take 0.5 mg by mouth at bedtime.   Marland Kitchen antiseptic oral rinse (BIOTENE) LIQD 15 mLs by Mouth Rinse route daily.  . Ascorbic Acid (VITAMIN C) 1000 MG tablet Take 1,000 mg by mouth daily.  Marland Kitchen aspirin 81 MG tablet Take 81 mg by mouth at bedtime.   . Cholecalciferol (VITAMIN D3) 50 MCG (2000 UT) TABS Take 1 tablet by mouth daily.  . Coenzyme Q10 (CO Q 10) 100 MG CAPS Take 100 mg by mouth at bedtime.   . colesevelam (WELCHOL) 625 MG tablet TAKE 3 TABLETS BY MOUTH TWICE DAILY  WITH MEALS  . doxepin (SINEQUAN) 50 MG capsule Take 100 mg by mouth at bedtime.   Marland Kitchen esomeprazole (NEXIUM) 20 MG capsule Take 20 mg by mouth 2 (two) times daily.  . furosemide (LASIX) 20 MG tablet TAKE 1 TABLET BY MOUTH ONCE DAILY AS NEEDED FOR  ANKLE  SWELLING  . HUMALOG 100 UNIT/ML injection 80 Units.   . hydrochlorothiazide (HYDRODIURIL) 25 MG tablet Take 1 tablet (25 mg total) by mouth daily.  Marland Kitchen levothyroxine (SYNTHROID) 75 MCG tablet Take 75 mcg by mouth every morning.  Marland Kitchen losartan (COZAAR) 100 MG tablet Take 1 tablet (100 mg total) by mouth daily.  . metFORMIN (GLUCOPHAGE) 500 MG tablet Take 1 tablet (500 mg total) by mouth 2 (two) times daily with a meal.  . metoprolol succinate (TOPROL-XL) 50 MG 24 hr tablet TAKE 1 TABLET BY MOUTH ONCE DAILY WITH A MEAL OR IMMEDIATELY FOLLOWING A MEAL.  . Multiple Vitamins-Minerals (CENTRUM SILVER 50+MEN) TABS Take 1 tablet by mouth daily.  . nitroGLYCERIN (NITROSTAT) 0.4 MG SL tablet Place 1 tablet (0.4 mg total) under the tongue every 5 (five) minutes as needed for chest pain.  . Omega-3 Fatty Acids (FISH OIL) 600 MG CAPS Take 1,400 mg by mouth at bedtime.   . pravastatin (PRAVACHOL) 40 MG tablet Take 1 tablet by mouth once daily  . SSD 1 % cream Apply topically as needed.  . SYMBICORT 160-4.5 MCG/ACT inhaler INHALE 2 PUFFS BY MOUTH EVERY 12 HOURS, RINSE MOUTH AFTER EVERY USE.  . Tiotropium Bromide Monohydrate (SPIRIVA RESPIMAT) 2.5 MCG/ACT AERS Inhale 2 puffs into the lungs daily.  . [DISCONTINUED] hydrochlorothiazide (HYDRODIURIL) 25 MG tablet Take 0.5 tablets (12.5 mg total) by mouth daily. (Patient taking differently: Take 25 mg by mouth daily. )  . [DISCONTINUED] nitroGLYCERIN (NITROSTAT) 0.4 MG SL tablet Place 1 tablet (0.4 mg total) under the tongue every 5 (five) minutes as needed for chest pain.   Current Facility-Administered Medications for the 06/03/20 encounter (Office Visit) with Richardson Dopp T, PA-C  Medication  . 0.9 %  sodium chloride  infusion     Allergies:   Patient has no known allergies.   Social History   Tobacco Use  . Smoking status: Former Smoker    Packs/day: 2.00    Years: 35.00    Pack years: 70.00    Types: Cigarettes    Quit date: 09/27/1997    Years since quitting: 22.6  . Smokeless tobacco: Never Used  Substance Use Topics  . Alcohol use: Yes    Comment: occ  . Drug use: No     Family Hx: The patient's family history includes Drug abuse in his brother. There is no history  of Heart disease, Heart failure, Diabetes, Hypertension, Colon cancer, Esophageal cancer, Prostate cancer, Pancreatic cancer, Kidney disease, Liver disease, Stomach cancer, or Rectal cancer.  ROS   EKGs/Labs/Other Test Reviewed:    EKG:  EKG is not ordered today.  The ekg ordered today demonstrates n/a  Recent Labs: 01/05/2020: ALT 30; Hemoglobin 13.4; Platelets 222 05/13/2020: BUN 12; Creatinine, Ser 1.18; Potassium 3.9; Sodium 141   Recent Lipid Panel Lab Results  Component Value Date/Time   CHOL 135 01/05/2020 10:45 AM   TRIG 119 01/05/2020 10:45 AM   HDL 56 01/05/2020 10:45 AM   CHOLHDL 2.4 01/05/2020 10:45 AM   CHOLHDL 2.2 06/05/2016 10:55 AM   LDLCALC 58 01/05/2020 10:45 AM      Risk Assessment/Calculations:      Physical Exam:    VS:  BP 138/70   Pulse 71   Ht 5\' 8"  (1.727 m)   Wt 184 lb (83.5 kg)   SpO2 97%   BMI 27.98 kg/m     Wt Readings from Last 3 Encounters:  06/03/20 184 lb (83.5 kg)  05/24/20 185 lb (83.9 kg)  05/02/20 185 lb 3.2 oz (84 kg)     Constitutional:      Appearance: Healthy appearance. Not in distress.  Pulmonary:     Effort: Pulmonary effort is normal.     Breath sounds: No wheezing. No rales.  Cardiovascular:     Normal rate. Regular rhythm. Normal S1. Normal S2.     Murmurs: There is no murmur.  Edema:    Pretibial: bilateral trace edema of the pretibial area. Abdominal:     Palpations: Abdomen is soft.  Musculoskeletal:     Cervical back: Neck supple. Skin:     General: Skin is warm and dry.  Neurological:     General: No focal deficit present.     Mental Status: Alert and oriented to person, place and time.     Cranial Nerves: Cranial nerves are intact.       ASSESSMENT & PLAN:    1. Chronic heart failure with preserved ejection fraction (HCC) His shortness of breath is multifactorial.  COPD is certainly playing a role in his dyspnea.  However, he does have diastolic dysfunction on his echocardiogram and clearly feels better since starting of thiazide diuretic.  Therefore, I suspect he has some degree of HFpEF.  He is currently NYHA II-IIb.  Volume status appears stable.  Continue current dose of HCTZ.  He may take furosemide as needed for significant leg swelling.  Obtain follow-up BMET today.  2. Coronary artery disease involving native coronary artery of native heart without angina pectoris S/p CABG in 1999.  Cath in 2019 with patent L-LAD.  Recent Myoview low risk patient no ischemia.  He is doing well without anginal symptoms.  Continue aspirin, metoprolol succinate, pravastatin.  3. Essential hypertension The patient's blood pressure is controlled on his current regimen.  Continue current therapy.     Dispo:  Return in about 3 months (around 09/03/2020) for Routine Follow Up w/ Dr. Marlou Porch.   Medication Adjustments/Labs and Tests Ordered: Current medicines are reviewed at length with the patient today.  Concerns regarding medicines are outlined above.  Tests Ordered: Orders Placed This Encounter  Procedures  . Basic metabolic panel   Medication Changes: Meds ordered this encounter  Medications  . nitroGLYCERIN (NITROSTAT) 0.4 MG SL tablet    Sig: Place 1 tablet (0.4 mg total) under the tongue every 5 (five) minutes as needed for chest pain.  Dispense:  25 tablet    Refill:  3  . hydrochlorothiazide (HYDRODIURIL) 25 MG tablet    Sig: Take 1 tablet (25 mg total) by mouth daily.    Dispense:  90 tablet    Refill:  3     Signed, Richardson Dopp, PA-C  06/03/2020 12:35 PM    [facility] [address], [place], [place]  [address] Phone: [phone]; Fax: [phone]

## 2020-06-03 ENCOUNTER — Other Ambulatory Visit: Payer: Self-pay

## 2020-06-03 ENCOUNTER — Ambulatory Visit (INDEPENDENT_AMBULATORY_CARE_PROVIDER_SITE_OTHER): Payer: Medicare Other | Admitting: Physician Assistant

## 2020-06-03 ENCOUNTER — Encounter: Payer: Self-pay | Admitting: Physician Assistant

## 2020-06-03 VITALS — BP 138/70 | HR 71 | Ht 68.0 in | Wt 184.0 lb

## 2020-06-03 DIAGNOSIS — R0602 Shortness of breath: Secondary | ICD-10-CM

## 2020-06-03 DIAGNOSIS — I10 Essential (primary) hypertension: Secondary | ICD-10-CM | POA: Diagnosis not present

## 2020-06-03 DIAGNOSIS — I5032 Chronic diastolic (congestive) heart failure: Secondary | ICD-10-CM

## 2020-06-03 DIAGNOSIS — I2510 Atherosclerotic heart disease of native coronary artery without angina pectoris: Secondary | ICD-10-CM | POA: Diagnosis not present

## 2020-06-03 LAB — BASIC METABOLIC PANEL
BUN/Creatinine Ratio: 15 (ref 10–24)
BUN: 18 mg/dL (ref 8–27)
CO2: 25 mmol/L (ref 20–29)
Calcium: 9.2 mg/dL (ref 8.6–10.2)
Chloride: 98 mmol/L (ref 96–106)
Creatinine, Ser: 1.2 mg/dL (ref 0.76–1.27)
GFR calc Af Amer: 69 mL/min/{1.73_m2} (ref >59)
GFR calc non Af Amer: 60 mL/min/{1.73_m2} (ref >59)
Glucose: 132 mg/dL — ABNORMAL HIGH (ref 65–99)
Potassium: 4.5 mmol/L (ref 3.5–5.2)
Sodium: 136 mmol/L (ref 134–144)

## 2020-06-03 MED ORDER — NITROGLYCERIN 0.4 MG SL SUBL: 0.4000 mg | SUBLINGUAL_TABLET | SUBLINGUAL | 3 refills | Status: AC | PRN

## 2020-06-03 MED ORDER — HYDROCHLOROTHIAZIDE 25 MG PO TABS: 25.0000 mg | ORAL_TABLET | Freq: Every day | ORAL | 3 refills | Status: DC

## 2020-06-03 NOTE — Patient Instructions (Addendum)
Medication Instructions:  Your physician recommends that you continue on your current medications as directed. Please refer to the Current Medication list given to you today.  *If you need a refill on your cardiac medications before your next appointment, please call your pharmacy*  Lab Work: You will have labs drawn today: BMET  If you have labs (blood work) drawn today and your tests are completely normal, you will receive your results only by: Marland Kitchen MyChart Message (if you have MyChart) OR . A paper copy in the mail If you have any lab test that is abnormal or we need to change your treatment, we will call you to review the results.  Testing/Procedures: None ordered today  Follow-Up: On 09/06/2020 at 1:20PM with Candee Furbish, MD

## 2020-06-14 DIAGNOSIS — E7800 Pure hypercholesterolemia, unspecified: Secondary | ICD-10-CM | POA: Diagnosis not present

## 2020-06-14 DIAGNOSIS — E1165 Type 2 diabetes mellitus with hyperglycemia: Secondary | ICD-10-CM | POA: Diagnosis not present

## 2020-06-14 DIAGNOSIS — I10 Essential (primary) hypertension: Secondary | ICD-10-CM | POA: Diagnosis not present

## 2020-06-14 DIAGNOSIS — E049 Nontoxic goiter, unspecified: Secondary | ICD-10-CM | POA: Diagnosis not present

## 2020-06-14 DIAGNOSIS — E02 Subclinical iodine-deficiency hypothyroidism: Secondary | ICD-10-CM | POA: Diagnosis not present

## 2020-06-14 DIAGNOSIS — Z9641 Presence of insulin pump (external) (internal): Secondary | ICD-10-CM | POA: Diagnosis not present

## 2020-06-15 ENCOUNTER — Telehealth: Payer: Self-pay | Admitting: Emergency Medicine

## 2020-06-15 MED ORDER — PREDNISONE 10 MG PO TABS: ORAL_TABLET | ORAL | 0 refills | Status: AC

## 2020-06-15 MED ORDER — DOXYCYCLINE HYCLATE 100 MG PO TABS: 100.0000 mg | ORAL_TABLET | Freq: Two times a day (BID) | ORAL | 0 refills | Status: DC

## 2020-06-15 NOTE — Telephone Encounter (Signed)
Please have him doxycycline 100mg  bid x 7 days  Prednisone >> Take 40mg  daily for 3 days, then 30mg  daily for 3 days, then 20mg  daily for 3 days, then 10mg  daily for 3 days, then stop

## 2020-06-15 NOTE — Telephone Encounter (Signed)
Called spoke with patient. Let him know [name] recommendations. Patient was inquiring about the Clorox smell and test. I let him know Raquel Sarna mentioned that but no recommendations were given, let patient know that if after starting the medication he was still having that symptom to give our office a call. Patient voiced understanding.   RX sent to preferred pharmacy  Nothing further needed at this time.

## 2020-06-15 NOTE — Telephone Encounter (Signed)
Called and spoke with pt who states he had complaints of wheezing and a productive cough which began yesterday 11/9 but gradually got worse as the day went on. Pt said when he coughed it had a bleach smell to it and also said that it had a clorox taste to it.  Pt said due to how he felt, he did go to bed earlier than usual. Pt does not believe he ran a temp overnight nor does he believe he has a temp now. Pt is not able to check temp as he does not know where the thermometer is.  Pt has not had to use his albuterol rescue inhaler nor the neb sol. Pt is using the spiriva and symbicort inhalers along with all other prescribed meds as directed.  Pt has had some increased SOB associated.  Pt wants to know what could be recommended to help with his symptoms. [name], please advise.

## 2020-06-27 DIAGNOSIS — Z23 Encounter for immunization: Secondary | ICD-10-CM | POA: Diagnosis not present

## 2020-07-06 ENCOUNTER — Other Ambulatory Visit: Payer: Self-pay | Admitting: Cardiology

## 2020-07-08 ENCOUNTER — Telehealth: Payer: Self-pay | Admitting: Emergency Medicine

## 2020-07-08 NOTE — Telephone Encounter (Signed)
Would recommend Mucinex twice daily as needed for cough and congestion. May use his albuterol inhaler or nebulizer as needed. Continue on Symbicort and Spiriva.  He was just given doxycycline and prednisone through 5 message 3 weeks ago.  We will need an office visit for further evaluation if symptoms persist and requires additional therapy. In consideration of chest x-ray  Please contact office for sooner follow up if symptoms do not improve or worsen or seek emergency care

## 2020-07-08 NOTE — Telephone Encounter (Signed)
Spoke with pt, aware of recs.  Scheduled to see Oscar Robinson on Monday at 3:30.  Nothing further needed at this time- will close encounter.

## 2020-07-08 NOTE — Telephone Encounter (Signed)
Called and spoke with patient who states on Tuesday he started getting symptoms of headache and mucus. Patient was treated in the past for same. States that he was outside in wind, dust,etc cooking [place] for Thanksgiving and thinks it brought it on. Dark yellow/green mucus. Tried mucinex so far with some relief.  Tammy please advise

## 2020-07-11 ENCOUNTER — Ambulatory Visit (INDEPENDENT_AMBULATORY_CARE_PROVIDER_SITE_OTHER): Payer: Medicare Other | Admitting: Pulmonary Disease

## 2020-07-11 ENCOUNTER — Ambulatory Visit (INDEPENDENT_AMBULATORY_CARE_PROVIDER_SITE_OTHER): Payer: Medicare Other

## 2020-07-11 ENCOUNTER — Encounter: Payer: Self-pay | Admitting: Pulmonary Disease

## 2020-07-11 ENCOUNTER — Other Ambulatory Visit: Payer: Self-pay

## 2020-07-11 VITALS — BP 160/74 | HR 76 | Temp 97.7°F | Ht 69.0 in | Wt 191.6 lb

## 2020-07-11 DIAGNOSIS — Z87891 Personal history of nicotine dependence: Secondary | ICD-10-CM | POA: Diagnosis not present

## 2020-07-11 DIAGNOSIS — J449 Chronic obstructive pulmonary disease, unspecified: Secondary | ICD-10-CM

## 2020-07-11 DIAGNOSIS — R059 Cough, unspecified: Secondary | ICD-10-CM | POA: Diagnosis not present

## 2020-07-11 DIAGNOSIS — J439 Emphysema, unspecified: Secondary | ICD-10-CM | POA: Diagnosis not present

## 2020-07-11 DIAGNOSIS — J90 Pleural effusion, not elsewhere classified: Secondary | ICD-10-CM | POA: Diagnosis not present

## 2020-07-11 MED ORDER — PREDNISONE 10 MG PO TABS: ORAL_TABLET | ORAL | 0 refills | Status: AC

## 2020-07-11 MED ORDER — AMOXICILLIN-POT CLAVULANATE 875-125 MG PO TABS: 1.0000 | ORAL_TABLET | Freq: Two times a day (BID) | ORAL | 0 refills | Status: DC

## 2020-07-11 NOTE — Assessment & Plan Note (Signed)
Plan: Prednisone taper today Augmentin today Chest x-ray today Continue Symbicort 160 Continue Spiriva Respimat 2.5 Follow-up in 2 weeks

## 2020-07-11 NOTE — Progress Notes (Signed)
@Patient  ID: Oscar Robinson, male    DOB: 08-30-1945, 74 y.o.   MRN: [id number]  Chief Complaint  Patient presents with  . Acute Home Visit    SOB/ producative cough/ wheezing increase     Referring provider: [name], MD  HPI:  74 year old male former smoker followed in our office for severe COPD  PMH: Type 2 diabetes, GERD Smoker/ Smoking History: Former smoker.  Quit 1999.  70-pack-year smoking history Maintenance: Symbicort 160, Spiriva HandiHaler Pt of: [name]  07/11/2020  - Visit   74 year old male former smoker followed in our office for COPD.  He is established with [name].  His presenting to our office today as a follow-up visit.  He was last seen by [name] in May/2021.  He was treated telephonically with doxycycline and a course of prednisone on 06/15/2020.  Patient contacted our office on 07/08/2020 reporting that he has increased dark, yellow-green mucus as well as worsened headache.  It was recommended he have an acute visit to further evaluate.  Patient presenting to office today he reported that he did initially get better with doxycycline and prednisone.  Unfortunately then he kicked outside around an open fire cooking [place] during Thanksgiving.  Breathing started to worsen since then.  Breathing worsened over the weekend and he started taking prednisone 50 mg daily on 07/09/2020, 07/10/2020, and 07/11/2020.  Patient continues to utilize [place] nebulized meds twice daily.  He feels clinical relief when he is using his [place].  He continues to be here to Symbicort 160 and Spiriva HandiHaler.   Questionaires / Pulmonary Flowsheets:   ACT:  No flowsheet data found.  MMRC: mMRC Dyspnea Scale mMRC Score  07/11/2020 3  11/27/2019 3    Epworth:  Results of the Epworth flowsheet 03/03/2018  Sitting and reading 1  Watching TV 1  Sitting, inactive in a public place (e.g. a theatre or a meeting) 0  As a passenger in a car for an hour without a  break 0  Lying down to rest in the afternoon when circumstances permit 0  Sitting and talking to someone 0  Sitting quietly after a lunch without alcohol 1  In a car, while stopped for a few minutes in traffic 0  Total score 3    Tests:   01/28/2018-chest x-ray-no active pulmonary disease, mild pulmonary hyperinflation  03/03/2018-spirometry- ratio 59, FEV1 37, severe obstruction  Last cardiopulmonary exercise test was 07/08/2015, Showed that most of his exercise limitation was due to to ventilatory defect  Echocardiogram from 01/28/2018 was reviewed today and shows intact left ventricular function with a normal RV size and function, estimated PASP 23 mmHg.   08/26/2018-chest x-ray-COPD without acute abnormality  FENO:  Lab Results  Component Value Date   NITRICOXIDE 6 05/09/2016    PFT: PFT Results Latest Ref Rng & Units 04/07/2014  FVC-Pre L 2.49  FVC-Predicted Pre % 57  FVC-Post L 2.84  FVC-Predicted Post % 65  Pre FEV1/FVC % % 51  Post FEV1/FCV % % 56  FEV1-Pre L 1.28  FEV1-Predicted Pre % 39  FEV1-Post L 1.60  DLCO uncorrected ml/min/mmHg 14.73  DLCO UNC% % 47  DLVA Predicted % 57  TLC L 6.10  TLC % Predicted % 89  RV % Predicted % 145    WALK:  SIX MIN WALK 12/03/2019 11/27/2019 01/30/2018 09/30/2017 09/13/2014 03/09/2014 11/03/2013  Supplimental Oxygen during Test? (L/min) No No - No No No No  Tech Comments: no desaturation TA/CMA  Patient walked at a fast pace and did not have to stop for any rest breaks.  Tolerated walk well. Walked a rapid pace, reports SOB. Tolerated well.  ambulated at fast, steady pace, denies SOB pt walked a fast pace.  - -    Imaging: No results found.  Lab Results:  CBC    Component Value Date/Time   WBC 7.2 01/05/2020 1045   WBC 8.6 07/31/2018 1438   RBC 4.36 01/05/2020 1045   RBC 4.22 07/31/2018 1438   HGB 13.4 01/05/2020 1045   HCT 40.2 01/05/2020 1045   PLT 222 01/05/2020 1045   MCV 92 01/05/2020 1045   MCH 30.7 01/05/2020 1045    MCH 21.3 (L) 06/05/2016 1055   MCHC 33.3 01/05/2020 1045   MCHC 33.1 07/31/2018 1438   RDW 12.6 01/05/2020 1045   LYMPHSABS 2.0 07/31/2018 1438   MONOABS 1.1 (H) 07/31/2018 1438   EOSABS 0.2 07/31/2018 1438   BASOSABS 0.1 07/31/2018 1438    BMET    Component Value Date/Time   NA 136 06/03/2020 1216   K 4.5 06/03/2020 1216   CL 98 06/03/2020 1216   CO2 25 06/03/2020 1216   GLUCOSE 132 (H) 06/03/2020 1216   GLUCOSE 140 (H) 06/05/2016 1055   BUN 18 06/03/2020 1216   CREATININE 1.20 06/03/2020 1216   CREATININE 1.26 (H) 06/05/2016 1055   CALCIUM 9.2 06/03/2020 1216   GFRNONAA 60 06/03/2020 1216   GFRAA 69 06/03/2020 1216    BNP No results found for: BNP  ProBNP    Component Value Date/Time   PROBNP 73 01/29/2018 1436    Specialty Problems      Pulmonary Problems   GOLD COPD C    Qualifier: Diagnosis of  By: [name] MD, Rose Fillers  04/07/2014-pulmonary function test- ratio 51, FEV1 39, significant bronchodilator response, DLCO 47, concavity and flow volume loops 03/03/2018-spirometry- ratio 59, FEV1 37, severe obstruction        Dyspnea   Upper respiratory tract infection      No Known Allergies  Immunization History  Administered Date(s) Administered  . Fluad Quad(high Dose 65+) 04/27/2019, 04/26/2020  . H1N1 07/09/2008  . Influenza Split 05/06/2012, 06/21/2015  . Influenza Whole 05/23/2010, 05/07/2011  . Influenza, High Dose Seasonal PF 05/23/2016, 04/14/2018  . Influenza,inj,Quad PF,6+ Mos 05/01/2013, 05/07/2014  . Influenza-Unspecified 04/01/2017  . PFIZER SARS-COV-2 Vaccination 09/27/2019, 10/21/2019, 07/04/2020  . Pneumococcal Conjugate-13 03/09/2014  . Pneumococcal Polysaccharide-23 05/23/2010, 06/23/2018  . Zoster 12/05/2014    Past Medical History:  Diagnosis Date  . Anemia   . Anxiety   . Arthritis   . CKD (chronic kidney disease), stage II   . COPD (chronic obstructive pulmonary disease) ([place])   . Coronary artery disease    a. s/p CABG  1999.  . Depression   . Diabetes mellitus   . Diverticulosis   . GERD (gastroesophageal reflux disease)   . Hiatal hernia   . History of echocardiogram    Echocardiogram 10/21: EF 60-65, no RWMA, Gr 1 DD, normal RVSF, RVSP 15.2   . History of nuclear stress test    Myoview 10/21: EF 62, normal perfusion; low risk   . Hypercholesteremia   . Hypertension   . Sleep apnea    had sleep study and negative per pt  . Thyroid disease   . Tobacco abuse   . Tubular adenoma of colon     Tobacco History: Social History   Tobacco Use  Smoking Status Former Smoker  .  Packs/day: 2.00  . Years: 35.00  . Pack years: 70.00  . Types: Cigarettes  . Quit date: 09/27/1997  . Years since quitting: 22.8  Smokeless Tobacco Never Used   Counseling given: Not Answered   Continue to not smoke  Outpatient Encounter Medications as of 07/11/2020  Medication Sig  . albuterol (PROAIR HFA) 108 (90 Base) MCG/ACT inhaler Inhale 2 puffs into the lungs every 4 (four) hours as needed for wheezing or shortness of breath.  Marland Kitchen albuterol (PROVENTIL) (2.5 MG/3ML) 0.083% nebulizer solution Take 3 mLs (2.5 mg total) by nebulization every 6 (six) hours as needed for wheezing or shortness of breath.  . ALPRAZolam (XANAX) 0.5 MG tablet Take 0.5 mg by mouth at bedtime.   Marland Kitchen antiseptic oral rinse (BIOTENE) LIQD 15 mLs by Mouth Rinse route daily.  . Ascorbic Acid (VITAMIN C) 1000 MG tablet Take 1,000 mg by mouth daily.  Marland Kitchen aspirin 81 MG tablet Take 81 mg by mouth at bedtime.   . Cholecalciferol (VITAMIN D3) 50 MCG (2000 UT) TABS Take 1 tablet by mouth daily.  . Coenzyme Q10 (CO Q 10) 100 MG CAPS Take 100 mg by mouth at bedtime.   . colesevelam (WELCHOL) 625 MG tablet TAKE 3 TABLETS BY MOUTH TWICE DAILY WITH MEALS  . doxepin (SINEQUAN) 50 MG capsule Take 100 mg by mouth at bedtime.   Marland Kitchen esomeprazole (NEXIUM) 20 MG capsule Take 20 mg by mouth 2 (two) times daily.  . furosemide (LASIX) 20 MG tablet TAKE 1 TABLET BY MOUTH ONCE  DAILY AS NEEDED FOR  ANKLE  SWELLING  . HUMALOG 100 UNIT/ML injection 80 Units.   . hydrochlorothiazide (HYDRODIURIL) 25 MG tablet Take 1 tablet (25 mg total) by mouth daily.  Marland Kitchen levothyroxine (SYNTHROID) 75 MCG tablet Take 75 mcg by mouth every morning.  Marland Kitchen losartan (COZAAR) 100 MG tablet Take 1 tablet (100 mg total) by mouth daily.  . metFORMIN (GLUCOPHAGE) 500 MG tablet Take 1 tablet (500 mg total) by mouth 2 (two) times daily with a meal.  . metoprolol succinate (TOPROL-XL) 50 MG 24 hr tablet TAKE 1 TABLET BY MOUTH ONCE DAILY WITH OR  IMMEDIATELY  FOLLOWING  A  MEAL  . Multiple Vitamins-Minerals (CENTRUM SILVER 50+MEN) TABS Take 1 tablet by mouth daily.  . nitroGLYCERIN (NITROSTAT) 0.4 MG SL tablet Place 1 tablet (0.4 mg total) under the tongue every 5 (five) minutes as needed for chest pain.  . Omega-3 Fatty Acids (FISH OIL) 600 MG CAPS Take 1,400 mg by mouth at bedtime.   . pravastatin (PRAVACHOL) 40 MG tablet Take 1 tablet by mouth once daily  . SSD 1 % cream Apply topically as needed.  . SYMBICORT 160-4.5 MCG/ACT inhaler INHALE 2 PUFFS BY MOUTH EVERY 12 HOURS, RINSE MOUTH AFTER EVERY USE.  . Tiotropium Bromide Monohydrate (SPIRIVA RESPIMAT) 2.5 MCG/ACT AERS Inhale 2 puffs into the lungs daily.  . [DISCONTINUED] doxycycline (VIBRA-TABS) 100 MG tablet Take 1 tablet (100 mg total) by mouth 2 (two) times daily.  Marland Kitchen amoxicillin-clavulanate (AUGMENTIN) 875-125 MG tablet Take 1 tablet by mouth 2 (two) times daily.  . predniSONE (DELTASONE) 10 MG tablet Take 4 tablets (40 mg total) by mouth daily with breakfast for 3 days, THEN 3 tablets (30 mg total) daily with breakfast for 3 days, THEN 2 tablets (20 mg total) daily with breakfast for 3 days, THEN 1 tablet (10 mg total) daily with breakfast for 3 days.   Facility-Administered Encounter Medications as of 07/11/2020  Medication  . 0.9 %  sodium chloride infusion     Review of Systems  Review of Systems  Constitutional: Positive for fatigue.  Negative for activity change, diaphoresis and fever.  HENT: Positive for congestion. Negative for postnasal drip, sneezing, sore throat and trouble swallowing.   Eyes: Negative.   Respiratory: Positive for cough, shortness of breath and wheezing.   Cardiovascular: Negative for chest pain and palpitations.  Gastrointestinal: Negative for diarrhea, nausea and vomiting.  Endocrine: Negative.   Genitourinary: Negative.   Musculoskeletal: Negative.  Negative for arthralgias.  Skin: Negative.   Allergic/Immunologic: Negative.   Neurological: Negative.   Hematological: Negative.   Psychiatric/Behavioral: Negative.  Negative for dysphoric mood. The patient is not nervous/anxious.      Physical Exam  BP (!) 160/74 (BP Location: Left Arm, Cuff Size: Normal)   Pulse 76   Temp 97.7 F (36.5 C)   Ht 5\' 9"  (1.753 m)   Wt 191 lb 9.6 oz (86.9 kg)   SpO2 97%   BMI 28.29 kg/m   Wt Readings from Last 5 Encounters:  07/11/20 191 lb 9.6 oz (86.9 kg)  06/03/20 184 lb (83.5 kg)  05/24/20 185 lb (83.9 kg)  05/02/20 185 lb 3.2 oz (84 kg)  01/05/20 182 lb (82.6 kg)    BMI Readings from Last 5 Encounters:  07/11/20 28.29 kg/m  06/03/20 27.98 kg/m  05/24/20 28.13 kg/m  05/02/20 28.16 kg/m  01/05/20 27.67 kg/m     Physical Exam Vitals and nursing note reviewed.  Constitutional:      General: He is not in acute distress.    Appearance: Normal appearance. He is normal weight.  HENT:     Head: Normocephalic and atraumatic.     Right Ear: Hearing and external ear normal.     Left Ear: Hearing and external ear normal.     Nose: Nose normal. No mucosal edema or rhinorrhea.     Right Turbinates: Not enlarged.     Left Turbinates: Not enlarged.     Mouth/Throat:     Mouth: Mucous membranes are dry.     Pharynx: Oropharynx is clear. No oropharyngeal exudate.  Eyes:     Pupils: Pupils are equal, round, and reactive to light.  Cardiovascular:     Rate and Rhythm: Normal rate and regular  rhythm.     Pulses: Normal pulses.     Heart sounds: Normal heart sounds. No murmur heard.   Pulmonary:     Effort: Pulmonary effort is normal.     Breath sounds: Wheezing present. No decreased breath sounds or rales.  Musculoskeletal:     Cervical back: Normal range of motion.     Right lower leg: No edema.     Left lower leg: No edema.  Lymphadenopathy:     Cervical: No cervical adenopathy.  Skin:    General: Skin is warm and dry.     Capillary Refill: Capillary refill takes less than 2 seconds.     Findings: No erythema or rash.  Neurological:     General: No focal deficit present.     Mental Status: He is alert and oriented to person, place, and time.     Motor: No weakness.     Coordination: Coordination normal.     Gait: Gait is intact. Gait normal.  Psychiatric:        Mood and Affect: Mood normal.        Behavior: Behavior normal. Behavior is cooperative.        Thought Content: Thought content  normal.        Judgment: Judgment normal.       Assessment & Plan:   GOLD COPD C Plan: Prednisone taper today Augmentin today Chest x-ray today Continue Symbicort 160 Continue Spiriva Respimat 2.5 Follow-up in 2 weeks  Former smoker Plan: X-ray today    Return in about 16 days (around 07/27/2020), or if symptoms worsen or fail to improve, for Follow up with Wyn Quaker FNP-C.   Lauraine Rinne, NP 07/11/2020   This appointment required 32 minutes of patient care (this includes precharting, chart review, review of results, face-to-face care, etc.).

## 2020-07-11 NOTE — Assessment & Plan Note (Signed)
Plan: X-ray today

## 2020-07-11 NOTE — Patient Instructions (Addendum)
You were seen today by Lauraine Rinne, NP  for:   1. Chronic obstructive pulmonary disease, unspecified COPD type ([place])  - DG Chest 2 View; Future - amoxicillin-clavulanate (AUGMENTIN) 875-125 MG tablet; Take 1 tablet by mouth 2 (two) times daily.  Dispense: 14 tablet; Refill: 0 - predniSONE (DELTASONE) 10 MG tablet; Take 4 tablets (40 mg total) by mouth daily with breakfast for 3 days, THEN 3 tablets (30 mg total) daily with breakfast for 3 days, THEN 2 tablets (20 mg total) daily with breakfast for 3 days, THEN 1 tablet (10 mg total) daily with breakfast for 3 days.  Dispense: 30 tablet; Refill: 0  Continue Symbicort 160 >>> 2 puffs in the morning right when you wake up, rinse out your mouth after use, 12 hours later 2 puffs, rinse after use >>> Take this daily, no matter what >>> This is not a rescue inhaler   Spiriva Respimat 2.5 >>> 2 puffs daily >>> Do this every day >>>This is not a rescue inhaler  Note your daily symptoms > remember "red flags" for COPD:   >>>Increase in cough >>>increase in sputum production >>>increase in shortness of breath or activity  intolerance.   If you notice these symptoms, please call the office to be seen.     2. Former smoker  Chest xray today      We will recheck your blood pressure before you leave     We recommend today:  Orders Placed This Encounter  Procedures  . DG Chest 2 View    Standing Status:   Future    Number of Occurrences:   1    Standing Expiration Date:   07/11/2021    Order Specific Question:   Reason for Exam (SYMPTOM  OR DIAGNOSIS REQUIRED)    Answer:   Cough    Order Specific Question:   Preferred imaging location?    Answer:   Internal   Orders Placed This Encounter  Procedures  . DG Chest 2 View   Meds ordered this encounter  Medications  . amoxicillin-clavulanate (AUGMENTIN) 875-125 MG tablet    Sig: Take 1 tablet by mouth 2 (two) times daily.    Dispense:  14 tablet    Refill:  0  . predniSONE  (DELTASONE) 10 MG tablet    Sig: Take 4 tablets (40 mg total) by mouth daily with breakfast for 3 days, THEN 3 tablets (30 mg total) daily with breakfast for 3 days, THEN 2 tablets (20 mg total) daily with breakfast for 3 days, THEN 1 tablet (10 mg total) daily with breakfast for 3 days.    Dispense:  30 tablet    Refill:  0    Follow Up:    Return in about 16 days (around 07/27/2020), or if symptoms worsen or fail to improve, for Follow up with Wyn Quaker FNP-C.   Notification of test results are managed in the following manner: If there are  any recommendations or changes to the  plan of care discussed in office today,  we will contact you and let you know what they are. If you do not hear from [place], then your results are normal and you can view them through your  MyChart account , or a letter will be sent to you. Thank you again for trusting [place] with your care  - Thank you, [facility]    It is flu season:   >>> Best ways to protect herself from the flu: Receive the yearly flu  vaccine, practice good hand hygiene washing with soap and also using hand sanitizer when available, eat a nutritious meals, get adequate rest, hydrate appropriately       Please contact the office if your symptoms worsen or you have concerns that you are not improving.   Thank you for choosing [facility] for your healthcare, and for allowing [place] to partner with you on your healthcare journey. I am thankful to be able to provide care to you today.   Wyn Quaker FNP-C

## 2020-07-27 ENCOUNTER — Ambulatory Visit: Payer: Medicare Other | Admitting: Pulmonary Disease

## 2020-08-08 ENCOUNTER — Encounter: Payer: Self-pay | Admitting: Acute Care

## 2020-08-08 ENCOUNTER — Ambulatory Visit (INDEPENDENT_AMBULATORY_CARE_PROVIDER_SITE_OTHER): Payer: Medicare Other | Admitting: Acute Care

## 2020-08-08 ENCOUNTER — Other Ambulatory Visit: Payer: Self-pay

## 2020-08-08 VITALS — BP 132/64 | HR 68 | Temp 97.3°F | Ht 70.0 in | Wt 190.4 lb

## 2020-08-08 DIAGNOSIS — J441 Chronic obstructive pulmonary disease with (acute) exacerbation: Secondary | ICD-10-CM | POA: Diagnosis not present

## 2020-08-08 DIAGNOSIS — R0600 Dyspnea, unspecified: Secondary | ICD-10-CM | POA: Diagnosis not present

## 2020-08-08 LAB — D-DIMER, QUANTITATIVE: D-Dimer, Quant: 0.46 mcg/mL FEU (ref <0.50)

## 2020-08-08 MED ORDER — ALBUTEROL SULFATE HFA 108 (90 BASE) MCG/ACT IN AERS: 2.0000 | INHALATION_SPRAY | RESPIRATORY_TRACT | 3 refills | Status: DC | PRN

## 2020-08-08 NOTE — Progress Notes (Signed)
History of Present Illness Oscar Robinson is a 75 y.o. male former smoker ( quit 1999 with a 70 pack year smoking history) with severe COPD. He is followed by Dr. Delton Coombes.  PMH: Type 2 diabetes, GERD Smoker/ Smoking History: Former smoker.  Quit 1999.  70-pack-year smoking history Maintenance: Symbicort 160, Spiriva HandiHaler  08/08/2020  Pt. Presents for follow up after a COPD exacerbation. He was last seen in the office by Elisha Headland NP on 07/11/2020 for COPD flare after being treated telephonically with doxycycline and a course of prednisone on 06/15/2020. Patient contacted our office on 07/08/2020 reporting that he has increased dark, yellow-green mucus as well as worsened headache. He was seen for an acute visit 12/6 and prescribed Augmentin and a pred taper by Elisha Headland, NP.   He presents today for follow up. He states he has been compliant with his Symbicort and Spiriva. He does use his SABA nebulized meds BID, and does get clinical relief from this. CXR done 12/6  has been reviewed and is noted below. Pt. States he completed the Augmentin and prednisone taper. He states he completed his medication. He states he did start feeling better while on the Augmentin and prednisone , but then worsened once he completed the medication. He denies any leg pain, swelling or long car rides.The patient states he does still have shortness of breath. He said this has been ongoing for about 2 months.He gets winded with exertion.  He states he did note chest congestion that he states improved on prednisone. He has noted some swelling in his ankles. Left > than right. He has been trying to cut down on his salt intake. He states he has not had any significant weight gain ( maybe 5 pounds or so). He states no blood in his secretions with the exception on only one time and it was a tiny amount. He states this was in November 2021. He states he is wheezing, more than his baseline. He has minimal cough/ secretions. He did  cough up a small amount of secretions yesterday. The color was gray. He denies any chest congestion. States that did get better with antibiotic and prednisone. He is appropriately concerned about his worsening dyspnea over the last 2 months. He does endorse an exposure to smoke in Nov 2021. He was around an air fryer cooking a [place] outside, and he has noted his breathing has been worse since then.   Walked in the office today, did not drop below 93% after 3 laps. HR max 96 with exertion  Pt has a normal echo 05/2020>> may need repeat if no obvious underlying pulmonary issues after work up.    MMRC: mMRC Dyspnea Scale mMRC Score  07/11/2020 3  11/27/2019 3  08/09/2019 >> 3     Test Results: 07/11/2020 CXR FINDINGS: Biapical pleuroparenchymal scarring and diffuse interstitial prominence. No focal consolidation, pneumothorax or pleural effusion. Postsurgical appearance of the cardiomediastinal silhouette is unchanged. Multilevel spondylosis.  IMPRESSION: Emphysema.  No focal consolidation.  Echo 05/2020       01/28/2018-chest x-ray-no active pulmonary disease, mild pulmonary hyperinflation  03/03/2018-spirometry- ratio 59, FEV1 37, severe obstruction  Last cardiopulmonary exercise test was 07/08/2015, Showed that most of his exercise limitation was due to to ventilatory defect  Echocardiogram from 01/28/2018 was reviewed today and shows intact left ventricular function with a normal RV size and function, estimated PASP 23 mmHg.   08/26/2018-chest x-ray-COPD without acute abnormality    CBC Latest Ref Rng &  Units 01/05/2020 07/31/2018 01/29/2018  WBC 3.4 - 10.8 x10E3/uL 7.2 8.6 5.7  Hemoglobin 13.0 - 17.7 g/dL 13.4 12.9(L) 13.2  Hematocrit 37.5 - 51.0 % 40.2 39.2 38.4  Platelets 150 - 450 x10E3/uL 222 204.0 253    BMP Latest Ref Rng & Units 06/03/2020 05/13/2020 01/05/2020  Glucose 65 - 99 mg/dL 132(H) 64(L) 156(H)  BUN 8 - 27 mg/dL 18 12 13   Creatinine 0.76 - 1.27  mg/dL 1.20 1.18 1.19  BUN/Creat Ratio 10 - 24 15 10 11   Sodium 134 - 144 mmol/L 136 141 139  Potassium 3.5 - 5.2 mmol/L 4.5 3.9 4.5  Chloride 96 - 106 mmol/L 98 103 102  CO2 20 - 29 mmol/L 25 25 23   Calcium 8.6 - 10.2 mg/dL 9.2 9.1 9.4    BNP No results found for: BNP  ProBNP    Component Value Date/Time   PROBNP 73 01/29/2018 1436    PFT    Component Value Date/Time   FEV1PRE 1.28 04/07/2014 1452   FEV1POST 1.60 04/07/2014 1452   FVCPRE 2.49 04/07/2014 1452   FVCPOST 2.84 04/07/2014 1452   TLC 6.10 04/07/2014 1452   DLCOUNC 14.73 04/07/2014 1452   PREFEV1FVCRT 51 04/07/2014 1452   PSTFEV1FVCRT 56 04/07/2014 1452    DG Chest 2 View  Result Date: 07/12/2020 CLINICAL DATA:  Cough EXAM: CHEST - 2 VIEW COMPARISON:  11/27/2019 chest radiograph and prior. FINDINGS: Biapical pleuroparenchymal scarring and diffuse interstitial prominence. No focal consolidation, pneumothorax or pleural effusion. Postsurgical appearance of the cardiomediastinal silhouette is unchanged. Multilevel spondylosis. IMPRESSION: Emphysema. No focal consolidation. Electronically Signed   By: Primitivo Gauze M.D.   On: 07/12/2020 14:40     Past medical hx Past Medical History:  Diagnosis Date  . Anemia   . Anxiety   . Arthritis   . CKD (chronic kidney disease), stage II   . COPD (chronic obstructive pulmonary disease) ([place])   . Coronary artery disease    a. s/p CABG 1999.  . Depression   . Diabetes mellitus   . Diverticulosis   . GERD (gastroesophageal reflux disease)   . Hiatal hernia   . History of echocardiogram    Echocardiogram 10/21: EF 60-65, no RWMA, Gr 1 DD, normal RVSF, RVSP 15.2   . History of nuclear stress test    Myoview 10/21: EF 62, normal perfusion; low risk   . Hypercholesteremia   . Hypertension   . Sleep apnea    had sleep study and negative per pt  . Thyroid disease   . Tobacco abuse   . Tubular adenoma of colon      Social History   Tobacco Use  . Smoking  status: Former Smoker    Packs/day: 2.00    Years: 35.00    Pack years: 70.00    Types: Cigarettes    Quit date: 09/27/1997    Years since quitting: 22.8  . Smokeless tobacco: Never Used  Substance Use Topics  . Alcohol use: Yes    Comment: occ  . Drug use: No    Mr.Purdy reports that he quit smoking about 22 years ago. His smoking use included cigarettes. He has a 70.00 pack-year smoking history. He has never used smokeless tobacco. He reports current alcohol use. He reports that he does not use drugs.  Tobacco Cessation: Former smoker quit 1999 with a 70 pack year smoking history.  Past surgical hx, Family hx, Social hx all reviewed.  Current Outpatient Medications on File Prior to Visit  Medication Sig  . albuterol (PROVENTIL) (2.5 MG/3ML) 0.083% nebulizer solution Take 3 mLs (2.5 mg total) by nebulization every 6 (six) hours as needed for wheezing or shortness of breath.  . ALPRAZolam (XANAX) 0.5 MG tablet Take 0.5 mg by mouth at bedtime.   Marland Kitchen amoxicillin-clavulanate (AUGMENTIN) 875-125 MG tablet Take 1 tablet by mouth 2 (two) times daily.  Marland Kitchen antiseptic oral rinse (BIOTENE) LIQD 15 mLs by Mouth Rinse route daily.  . Ascorbic Acid (VITAMIN C) 1000 MG tablet Take 1,000 mg by mouth daily.  Marland Kitchen aspirin 81 MG tablet Take 81 mg by mouth at bedtime.   . Cholecalciferol (VITAMIN D3) 50 MCG (2000 UT) TABS Take 1 tablet by mouth daily.  . Coenzyme Q10 (CO Q 10) 100 MG CAPS Take 100 mg by mouth at bedtime.   . colesevelam (WELCHOL) 625 MG tablet TAKE 3 TABLETS BY MOUTH TWICE DAILY WITH MEALS  . doxepin (SINEQUAN) 50 MG capsule Take 100 mg by mouth at bedtime.   Marland Kitchen esomeprazole (NEXIUM) 20 MG capsule Take 20 mg by mouth 2 (two) times daily.  . furosemide (LASIX) 20 MG tablet TAKE 1 TABLET BY MOUTH ONCE DAILY AS NEEDED FOR  ANKLE  SWELLING  . HUMALOG 100 UNIT/ML injection 80 Units.   . hydrochlorothiazide (HYDRODIURIL) 25 MG tablet Take 1 tablet (25 mg total) by mouth daily.  Marland Kitchen levothyroxine  (SYNTHROID) 75 MCG tablet Take 75 mcg by mouth every morning.  Marland Kitchen losartan (COZAAR) 100 MG tablet Take 1 tablet (100 mg total) by mouth daily.  . metFORMIN (GLUCOPHAGE) 500 MG tablet Take 1 tablet (500 mg total) by mouth 2 (two) times daily with a meal.  . metoprolol succinate (TOPROL-XL) 50 MG 24 hr tablet TAKE 1 TABLET BY MOUTH ONCE DAILY WITH OR  IMMEDIATELY  FOLLOWING  A  MEAL  . Multiple Vitamins-Minerals (CENTRUM SILVER 50+MEN) TABS Take 1 tablet by mouth daily.  . nitroGLYCERIN (NITROSTAT) 0.4 MG SL tablet Place 1 tablet (0.4 mg total) under the tongue every 5 (five) minutes as needed for chest pain.  . Omega-3 Fatty Acids (FISH OIL) 600 MG CAPS Take 1,400 mg by mouth at bedtime.   . pravastatin (PRAVACHOL) 40 MG tablet Take 1 tablet by mouth once daily  . SSD 1 % cream Apply topically as needed.  . SYMBICORT 160-4.5 MCG/ACT inhaler INHALE 2 PUFFS BY MOUTH EVERY 12 HOURS, RINSE MOUTH AFTER EVERY USE.  . Tiotropium Bromide Monohydrate (SPIRIVA RESPIMAT) 2.5 MCG/ACT AERS Inhale 2 puffs into the lungs daily.   Current Facility-Administered Medications on File Prior to Visit  Medication  . 0.9 %  sodium chloride infusion     No Known Allergies  Review Of Systems:  Constitutional:   No  weight loss, night sweats,  Fevers, chills, fatigue, or  lassitude.  HEENT:   No headaches,  Difficulty swallowing,  + Tooth/dental problems,   No Sore throat,                No sneezing, itching, ear ache, nasal congestion, post nasal drip,   CV:  No chest pain,  Orthopnea, PND, swelling in lower extremities, anasarca, dizziness, palpitations, syncope.   GI  No heartburn, indigestion, abdominal pain, nausea, vomiting, diarrhea, change in bowel habits, loss of appetite, bloody stools.   Resp: + shortness of breath with exertion or at rest.  No excess mucus, no productive cough,  No non-productive cough,  No coughing up of blood.  No change in color of mucus.  No wheezing.  No chest wall  deformity  Skin: no rash or lesions.  GU: no dysuria, change in color of urine, no urgency or frequency.  No flank pain, no hematuria   MS:  No joint pain or swelling.  No decreased range of motion.  No back pain.  Psych:  No change in mood or affect. No depression or anxiety.  No memory loss.   Vital Signs BP 132/64 (BP Location: Right Arm, Cuff Size: Normal)   Pulse 68   Temp (!) 97.3 F (36.3 C) (Oral)   Ht 5\' 10"  (1.778 m)   Wt 190 lb 6.4 oz (86.4 kg)   SpO2 97%   BMI 27.32 kg/m    Physical Exam:  General- No distress,  A&Ox3, pleasant ENT: No sinus tenderness, TM clear, pale nasal mucosa, no oral exudate,no post nasal drip, no LAN Cardiac: S1, S2, regular rate and rhythm, no murmur Chest: + Wheeze bilateral lower lobes. No  rales/ dullness; no accessory muscle use, no nasal flaring, no sternal retractions Abd.: Soft Non-tender, ND, BS + Ext: No clubbing cyanosis, + Bilateral ankle edema>> L>R Neuro:  normal strength, MAE x 4, A&O x 3 Skin: No rashes,No lesions,  warm and dry Psych: anxious, appropriately concerned about woprsening shortness of breath   Assessment/Plan COPD Flare Plan Continue Symbicort 160 Continue Spiriva Respimat 2.5 Rinse mouth after use Continue using Biotin Mouthwash for your dry mouth. We will order some labs today.( D dimer)  We will order a CT Chest without contrast. We will call you with results. We will fill out paperwork for a handicap placard. ( 6 month completed for now>> can reconsider in 6 months for permanent) We will walk you today. We will schedule you for PFT's to reassess your pulmonary function. We will consider repeat cardiac echo if no better, and no obvious pulmonary etiology.( Last echo 05/2020)  Follow up with Judson Roch NP or [name] in 2 weeks. Please contact office for sooner follow up if symptoms do not improve or worsen or seek emergency care   Dental issues Pt. Is experiencing teeth falling out and  chipping. Plan Make sure you rinse your mouth every time you use your  inhalers. Follow up with your dentist, or get a second opinion from another dentist regarding plans for dental issues.  Pt is up to date on Covid vaccine and Booster.   This appointment required 45 minutes of patient care (this includes precharting, chart review, review of results, face-to-face care, etc.).  Magdalen Spatz, NP 08/08/2020  12:09 PM

## 2020-08-08 NOTE — Patient Instructions (Addendum)
It is good to see you today. Continue Symbicort 160 Continue Spiriva Respimat 2.5 Rinse mouth after use We will order some labs today.( D dimer)  We will order a CT Chest without contrast. We will call you with results. We will fill out paperwork for a handicap placard. We will walk you today. We will schedule you for PFT's to reassess your pulmonary function.  Consider repeat echo if no obvious underlying pulmonary issues. ( Last 05/2020)  Follow up with Maralyn Sago NP or Dr. Delton Coombes in 2 weeks. Follow up with your dentist, get a second opinion re: plans for dented/ damaged teeth.  Please contact office for sooner follow up if symptoms do not improve or worsen or seek emergency care

## 2020-08-08 NOTE — Progress Notes (Signed)
Please call patient and let him know his d dimer was within normal limits which is good news. We will continue with the rest of our work up. Thanks so much

## 2020-08-15 ENCOUNTER — Telehealth: Payer: Self-pay | Admitting: Acute Care

## 2020-08-16 NOTE — Telephone Encounter (Signed)
I have called the patient back it was about his Ct to find out where to schedule I have called GI and transferred him over

## 2020-08-19 ENCOUNTER — Telehealth: Payer: Self-pay | Admitting: Pulmonary Disease

## 2020-08-19 DIAGNOSIS — Z1152 Encounter for screening for COVID-19: Secondary | ICD-10-CM | POA: Diagnosis not present

## 2020-08-19 DIAGNOSIS — Z20828 Contact with and (suspected) exposure to other viral communicable diseases: Secondary | ICD-10-CM | POA: Diagnosis not present

## 2020-08-19 NOTE — Telephone Encounter (Signed)
[facility] to see if I could provide a verbal of the covid test pt is needing to get done and was told that they needed something written that could be placed on a fax cover sheet.  Urgent fax has been sent to [facility] in [place] at provided fax number of the covid test that is needing to be done. Nothing further needed.

## 2020-08-19 NOTE — Telephone Encounter (Signed)
[place] and placed on long hold  [employer]

## 2020-08-24 ENCOUNTER — Ambulatory Visit (INDEPENDENT_AMBULATORY_CARE_PROVIDER_SITE_OTHER): Payer: Medicare Other | Admitting: Emergency Medicine

## 2020-08-24 ENCOUNTER — Telehealth: Payer: Self-pay | Admitting: Acute Care

## 2020-08-24 ENCOUNTER — Encounter: Payer: Self-pay | Admitting: Acute Care

## 2020-08-24 ENCOUNTER — Other Ambulatory Visit: Payer: Self-pay

## 2020-08-24 ENCOUNTER — Ambulatory Visit (INDEPENDENT_AMBULATORY_CARE_PROVIDER_SITE_OTHER): Payer: Medicare Other | Admitting: Acute Care

## 2020-08-24 VITALS — BP 128/70 | HR 77 | Temp 97.0°F | Ht 69.0 in | Wt 192.0 lb

## 2020-08-24 DIAGNOSIS — R059 Cough, unspecified: Secondary | ICD-10-CM

## 2020-08-24 DIAGNOSIS — R0600 Dyspnea, unspecified: Secondary | ICD-10-CM | POA: Diagnosis not present

## 2020-08-24 DIAGNOSIS — J449 Chronic obstructive pulmonary disease, unspecified: Secondary | ICD-10-CM | POA: Diagnosis not present

## 2020-08-24 LAB — PULMONARY FUNCTION TEST
DL/VA % pred: 95 %
DL/VA: 3.83 ml/min/mmHg/L
DLCO cor % pred: 58 %
DLCO cor: 14.42 ml/min/mmHg
DLCO unc % pred: 58 %
DLCO unc: 14.42 ml/min/mmHg
FEF 25-75 Post: 0.73 L/sec
FEF 25-75 Pre: 0.43 L/sec
FEF2575-%Change-Post: 72 %
FEF2575-%Pred-Post: 32 %
FEF2575-%Pred-Pre: 19 %
FEV1-%Change-Post: 16 %
FEV1-%Pred-Post: 41 %
FEV1-%Pred-Pre: 35 %
FEV1-Post: 1.26 L
FEV1-Pre: 1.09 L
FEV1FVC-%Change-Post: -2 %
FEV1FVC-%Pred-Pre: 77 %
FEV6-%Change-Post: 18 %
FEV6-%Pred-Post: 58 %
FEV6-%Pred-Pre: 49 %
FEV6-Post: 2.28 L
FEV6-Pre: 1.93 L
FEV6FVC-%Change-Post: 0 %
FEV6FVC-%Pred-Post: 105 %
FEV6FVC-%Pred-Pre: 106 %
FVC-%Change-Post: 19 %
FVC-%Pred-Post: 55 %
FVC-%Pred-Pre: 46 %
FVC-Post: 2.3 L
FVC-Pre: 1.93 L
Post FEV1/FVC ratio: 55 %
Post FEV6/FVC ratio: 99 %
Pre FEV1/FVC ratio: 56 %
Pre FEV6/FVC Ratio: 100 %
RV % pred: 138 %
RV: 3.4 L
TLC % pred: 95 %
TLC: 6.52 L

## 2020-08-24 NOTE — Progress Notes (Signed)
History of Present Illness Oscar Robinson is a 75 y.o. male former smoker ( quit 1999 with a 70 pack year smoking history) with severe COPD. He is followed by Dr. Lamonte Robinson.  PMH: Type 2 diabetes, GERD Smoker/ Smoking History: Former smoker. Quit 1999. 70-pack-year smoking history Maintenance: Symbicort 160, Spiriva Handi Haler  08/24/2020  Pt. Presents for follow up. Seen Telephonically 11/10/2-21 for COPD Flare>> Treated with Doxy and Pred taper. Completed both. 07/08/2021 Called the office with complaints of increased dark, yellow-green mucus as well as worsened headache. He was seen for an acute visit 12/6 and prescribed Augmentin and a pred taper by Oscar Quaker, NP.  Follow up 08/08/2020 >> continued shortness of breath. CT Chest is pending. PFT's are being done today.  D dimer was negative 0.46, and patient was walked, but did not drop sats below 93% after 3 laps Plan to follow up on labs, and if no obvious pulmonary etiology, will consider repeating echo.  Pt. Presents today and states  He is feeling better after prednisone taper and antibiotic. He is planning on starting back on the tread mill for exercise. He states he does have a good response to BD, and states he feels better after using his rescue inhaler. PFT's today confirm severe COPD, Moderate reduction in DLCO and + BD response.He is compliant with Symbicort and Spiriva. These are PFT's after use of Symbicort this morning.  I suspect true values are even more severe. He uses his nebs only as needed. He is having a hard time telling me how often he uses rescue. Some days he needs it and other days he does not. Secretions are greenish color. We will culture secretions. He states he has had intermittent wheezing. ( Exp per my exam). He denies fever, chest pain, orthopnea or hemoptysis. CT Chest pending for follow up of worsening dyspnea.      Test Results: PFT's 08/24/2020          07/11/2020 CXR FINDINGS: Biapical  pleuroparenchymal scarring and diffuse interstitial prominence. No focal consolidation, pneumothorax or pleural effusion. Postsurgical appearance of the cardiomediastinal silhouette is unchanged. Multilevel spondylosis.  IMPRESSION: Emphysema.  No focal consolidation.  01/28/2018-chest x-ray-no active pulmonary disease, mild pulmonary hyperinflation  03/03/2018-spirometry-ratio 59, FEV1 37, severe obstruction  Last cardiopulmonary exercise test was 07/08/2015, Showed that most of his exercise limitation was due to to ventilatory defect  Echocardiogram from 01/28/2018 was reviewed today and shows intact left ventricular function with a normal RV size and function, estimated PASP 23 mmHg.   08/26/2018-chest x-ray-COPD without acute abnormality   CBC Latest Ref Rng & Units 01/05/2020 07/31/2018 01/29/2018  WBC 3.4 - 10.8 x10E3/uL 7.2 8.6 5.7  Hemoglobin 13.0 - 17.7 g/dL 13.4 12.9(L) 13.2  Hematocrit 37.5 - 51.0 % 40.2 39.2 38.4  Platelets 150 - 450 x10E3/uL 222 204.0 253    BMP Latest Ref Rng & Units 06/03/2020 05/13/2020 01/05/2020  Glucose 65 - 99 mg/dL 132(H) 64(L) 156(H)  BUN 8 - 27 mg/dL 18 12 13   Creatinine 0.76 - 1.27 mg/dL 1.20 1.18 1.19  BUN/Creat Ratio 10 - 24 15 10 11   Sodium 134 - 144 mmol/L 136 141 139  Potassium 3.5 - 5.2 mmol/L 4.5 3.9 4.5  Chloride 96 - 106 mmol/L 98 103 102  CO2 20 - 29 mmol/L 25 25 23   Calcium 8.6 - 10.2 mg/dL 9.2 9.1 9.4    BNP No results found for: BNP  ProBNP    Component Value Date/Time   PROBNP  73 01/29/2018 1436    PFT    Component Value Date/Time   FEV1PRE 1.09 08/24/2020 1334   FEV1POST 1.26 08/24/2020 1334   FVCPRE 1.93 08/24/2020 1334   FVCPOST 2.30 08/24/2020 1334   TLC 6.52 08/24/2020 1334   DLCOUNC 14.42 08/24/2020 1334   PREFEV1FVCRT 56 08/24/2020 1334   PSTFEV1FVCRT 55 08/24/2020 1334    No results found.   Past medical hx Past Medical History:  Diagnosis Date  . Anemia   . Anxiety   . Arthritis   . CKD  (chronic kidney disease), stage II   . COPD (chronic obstructive pulmonary disease) ([place])   . Coronary artery disease    a. s/p CABG 1999.  . Depression   . Diabetes mellitus   . Diverticulosis   . GERD (gastroesophageal reflux disease)   . Hiatal hernia   . History of echocardiogram    Echocardiogram 10/21: EF 60-65, no RWMA, Gr 1 DD, normal RVSF, RVSP 15.2   . History of nuclear stress test    Myoview 10/21: EF 62, normal perfusion; low risk   . Hypercholesteremia   . Hypertension   . Sleep apnea    had sleep study and negative per pt  . Thyroid disease   . Tobacco abuse   . Tubular adenoma of colon      Social History   Tobacco Use  . Smoking status: Former Smoker    Packs/day: 2.00    Years: 35.00    Pack years: 70.00    Types: Cigarettes    Quit date: 09/27/1997    Years since quitting: 22.9  . Smokeless tobacco: Never Used  Substance Use Topics  . Alcohol use: Yes    Comment: occ  . Drug use: No    Oscar Robinson reports that he quit smoking about 22 years ago. His smoking use included cigarettes. He has a 70.00 pack-year smoking history. He has never used smokeless tobacco. He reports current alcohol use. He reports that he does not use drugs.  Tobacco Cessation: Former smoker , quit 1999 with a 70 pack year smoking history  Past surgical hx, Family hx, Social hx all reviewed.  Current Outpatient Medications on File Prior to Visit  Medication Sig  . albuterol (PROAIR HFA) 108 (90 Base) MCG/ACT inhaler Inhale 2 puffs into the lungs every 4 (four) hours as needed for wheezing or shortness of breath.  Marland Kitchen albuterol (PROVENTIL) (2.5 MG/3ML) 0.083% nebulizer solution Take 3 mLs (2.5 mg total) by nebulization every 6 (six) hours as needed for wheezing or shortness of breath.  . ALPRAZolam (XANAX) 0.5 MG tablet Take 0.5 mg by mouth at bedtime.   Marland Kitchen amoxicillin-clavulanate (AUGMENTIN) 875-125 MG tablet Take 1 tablet by mouth 2 (two) times daily.  Marland Kitchen antiseptic oral rinse  (BIOTENE) LIQD 15 mLs by Mouth Rinse route daily.  . Ascorbic Acid (VITAMIN C) 1000 MG tablet Take 1,000 mg by mouth daily.  Marland Kitchen aspirin 81 MG tablet Take 81 mg by mouth at bedtime.   . Cholecalciferol (VITAMIN D3) 50 MCG (2000 UT) TABS Take 1 tablet by mouth daily.  . Coenzyme Q10 (CO Q 10) 100 MG CAPS Take 100 mg by mouth at bedtime.   . colesevelam (WELCHOL) 625 MG tablet TAKE 3 TABLETS BY MOUTH TWICE DAILY WITH MEALS  . doxepin (SINEQUAN) 50 MG capsule Take 100 mg by mouth at bedtime.   Marland Kitchen esomeprazole (NEXIUM) 20 MG capsule Take 20 mg by mouth 2 (two) times daily.  . furosemide (LASIX) 20  MG tablet TAKE 1 TABLET BY MOUTH ONCE DAILY AS NEEDED FOR  ANKLE  SWELLING  . HUMALOG 100 UNIT/ML injection 80 Units.   . hydrochlorothiazide (HYDRODIURIL) 25 MG tablet Take 1 tablet (25 mg total) by mouth daily.  Marland Kitchen levothyroxine (SYNTHROID) 75 MCG tablet Take 75 mcg by mouth every morning.  Marland Kitchen losartan (COZAAR) 100 MG tablet Take 1 tablet (100 mg total) by mouth daily.  . metFORMIN (GLUCOPHAGE) 500 MG tablet Take 1 tablet (500 mg total) by mouth 2 (two) times daily with a meal.  . metoprolol succinate (TOPROL-XL) 50 MG 24 hr tablet TAKE 1 TABLET BY MOUTH ONCE DAILY WITH OR  IMMEDIATELY  FOLLOWING  A  MEAL  . Multiple Vitamins-Minerals (CENTRUM SILVER 50+MEN) TABS Take 1 tablet by mouth daily.  . nitroGLYCERIN (NITROSTAT) 0.4 MG SL tablet Place 1 tablet (0.4 mg total) under the tongue every 5 (five) minutes as needed for chest pain.  . Omega-3 Fatty Acids (FISH OIL) 600 MG CAPS Take 1,400 mg by mouth at bedtime.   . pravastatin (PRAVACHOL) 40 MG tablet Take 1 tablet by mouth once daily  . SSD 1 % cream Apply topically as needed.  . SYMBICORT 160-4.5 MCG/ACT inhaler INHALE 2 PUFFS BY MOUTH EVERY 12 HOURS, RINSE MOUTH AFTER EVERY USE.  . Tiotropium Bromide Monohydrate (SPIRIVA RESPIMAT) 2.5 MCG/ACT AERS Inhale 2 puffs into the lungs daily.   Current Facility-Administered Medications on File Prior to Visit   Medication  . 0.9 %  sodium chloride infusion     No Known Allergies  Review Of Systems:  Constitutional:   No  weight loss, night sweats,  Fevers, chills, fatigue, or  lassitude.  HEENT:   No headaches,  Difficulty swallowing,  Tooth/dental problems, or  Sore throat,                No sneezing, itching, ear ache, nasal congestion, post nasal drip,   CV:  No chest pain,  Orthopnea, PND, swelling in lower extremities, anasarca, dizziness, palpitations, syncope.   GI  No heartburn, indigestion, abdominal pain, nausea, vomiting, diarrhea, change in bowel habits, loss of appetite, bloody stools.   Resp: + shortness of breath with exertion less at rest.  + excess mucus, no productive cough,  No non-productive cough,  No coughing up of blood.  No change in color of mucus.  No wheezing.  No chest wall deformity  Skin: no rash or lesions.  GU: no dysuria, change in color of urine, no urgency or frequency.  No flank pain, no hematuria   MS:  No joint pain or swelling.  No decreased range of motion.  No back pain.  Psych:  No change in mood or affect. No depression or anxiety.  No memory loss.   Vital Signs BP 128/70 (BP Location: Left Arm, Cuff Size: Normal)   Pulse 77   Temp (!) 97 F (36.1 C) (Oral)   Ht 5\' 9"  (1.753 m)   Wt 192 lb (87.1 kg)   SpO2 95%   BMI 28.35 kg/m    Physical Exam:  General- No distress,  A&Ox3, pleasant ENT: No sinus tenderness, TM clear, pale nasal mucosa, no oral exudate,no post nasal drip, no LAN Cardiac: S1, S2, regular rate and rhythm, no murmur Chest: + exp.  wheeze/ no rales/ no dullness; no accessory muscle use, no nasal flaring, no sternal retractions Abd.: Soft Non-tender, ND, BS +. Body mass index is 28.35 kg/m. Ext: No clubbing cyanosis, edema Neuro:  normal strength, MAE  x 4, A&O x 3 Skin: No rashes, No lesions, warm and dry Psych: normal mood and behavior   Assessment/Plan  COPD Flare Continued congestion PFT's with severe  COPD/ moderate decreased diffusion capacity. + BD response Plan I am glad you are feeling better. Continue Symbicort 160 daily without fail Continue Spiriva Respimat 2.5 daily without fail Rinse mouth after use Use your rescue inhaler or nebulizer treatment as needed for shortness of breath or wheezing. OK to try to use this before you exert yourself to see if this helps.   Continue using Biotin Mouthwash for your dry mouth. CT Chest without contrast as scheduled 09/01/2020 . We will call you with results. We will culture sputum today ( Culture/ AFB/ Fungal) Please get yourself an oxygen saturation monitor.  Monitor your oxygen sats with exercise.  We will consider repeat cardiac echo/ Pulmonary stress test  if no better, and no obvious pulmonary etiology.( Last echo 05/2020)  Follow up with Judson Roch NP for a tele visit after CT Chest.  Follow up with Dr. Lamonte Robinson in 3-6 months Please contact office for sooner follow up if symptoms do not improve or worsen or seek emergency care   Magdalen Spatz, NP 08/24/2020  3:41 PM

## 2020-08-24 NOTE — Progress Notes (Signed)
Full PFT performed today. °

## 2020-08-24 NOTE — Patient Instructions (Addendum)
It is good to see you today. I am glad you are feeling better. Continue Symbicort 160 daily without fail Continue Spiriva Respimat 2.5 daily without fail Rinse mouth after use Use your rescue inhaler or nebulizer treatment as needed for shortness of breath or wheezing. OK to try to use this before you exert yourself to see if this helps.   Continue using Biotin Mouthwash for your dry mouth. CT Chest without contrast as scheduled 09/01/2020 . We will call you with results. We will culture sputum today ( Culture/ AFB/ Fungal) Please get yourself an oxygen saturation monitor.  Monitor your oxygen sats with exercise.  We will consider repeat cardiac echo/ Pulmonary stress test  if no better, and no obvious pulmonary etiology.( Last echo 05/2020)  Follow up with Judson Roch NP for a tele visit after CT Chest.  Please contact office for sooner follow up if symptoms do not improve or worsen or seek emergency care

## 2020-08-24 NOTE — Telephone Encounter (Signed)
Called and spoke with Patient.  Patient is scheduled tele visit with Judson Roch, NP 09/07/20 at 4pm to review chest CT results.

## 2020-09-01 ENCOUNTER — Ambulatory Visit
Admission: RE | Admit: 2020-09-01 | Discharge: 2020-09-01 | Disposition: A | Discharge status: Home / Self Care | Payer: Medicare Other | Source: Ambulatory Visit | Attending: Acute Care | Admitting: Acute Care

## 2020-09-01 ENCOUNTER — Other Ambulatory Visit: Payer: Self-pay

## 2020-09-01 DIAGNOSIS — K449 Diaphragmatic hernia without obstruction or gangrene: Secondary | ICD-10-CM | POA: Diagnosis not present

## 2020-09-01 DIAGNOSIS — R0600 Dyspnea, unspecified: Secondary | ICD-10-CM | POA: Diagnosis not present

## 2020-09-01 DIAGNOSIS — J432 Centrilobular emphysema: Secondary | ICD-10-CM | POA: Diagnosis not present

## 2020-09-01 DIAGNOSIS — I10 Essential (primary) hypertension: Secondary | ICD-10-CM | POA: Diagnosis not present

## 2020-09-06 ENCOUNTER — Ambulatory Visit (INDEPENDENT_AMBULATORY_CARE_PROVIDER_SITE_OTHER): Payer: Medicare Other | Admitting: Cardiology

## 2020-09-06 ENCOUNTER — Encounter: Payer: Self-pay | Admitting: Cardiology

## 2020-09-06 ENCOUNTER — Other Ambulatory Visit: Payer: Self-pay

## 2020-09-06 VITALS — BP 130/60 | HR 89 | Ht 69.0 in | Wt 191.0 lb

## 2020-09-06 DIAGNOSIS — I2510 Atherosclerotic heart disease of native coronary artery without angina pectoris: Secondary | ICD-10-CM | POA: Diagnosis not present

## 2020-09-06 DIAGNOSIS — I5032 Chronic diastolic (congestive) heart failure: Secondary | ICD-10-CM

## 2020-09-06 DIAGNOSIS — R0602 Shortness of breath: Secondary | ICD-10-CM

## 2020-09-06 NOTE — Progress Notes (Addendum)
Cardiology Office Note:    Date:  09/06/2020   ID:  Oscar Robinson, DOB 27-Oct-1945, MRN [id number]  PCP:  [name], MD  [facility] Cardiologist:  Candee Furbish, MD  [facility] Electrophysiologist:  None   Referring MD: [name], MD    History of Present Illness:    Oscar Robinson is a 75 y.o. male here for the follow-up of coronary artery disease.  CABG 1999-LIMA to LAD  COPD followed by pulmonary clinic, [name]  Lower extremity edema left greater than right in general.  Has been short of breath.  Encouraged exercise.  States that he has been using the treadmill for 20 years.  Continue to exercise.  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  He did ask for potential name of psychiatry.  Gave him the name Dr. Toy Care  Past Medical History:  Diagnosis Date  . Anemia   . Anxiety   . Arthritis   . CKD (chronic kidney disease), stage II   . COPD (chronic obstructive pulmonary disease) ([place])   . Coronary artery disease    a. s/p CABG 1999.  . Depression   . Diabetes mellitus   . Diverticulosis   . GERD (gastroesophageal reflux disease)   . Hiatal hernia   . History of echocardiogram    Echocardiogram 10/21: EF 60-65, no RWMA, Gr 1 DD, normal RVSF, RVSP 15.2   . History of nuclear stress test    Myoview 10/21: EF 62, normal perfusion; low risk   . Hypercholesteremia   . Hypertension   . Sleep apnea    had sleep study and negative per pt  . Thyroid disease   . Tobacco abuse   . Tubular adenoma of colon     Past Surgical History:  Procedure Laterality Date  . COLONOSCOPY    . CORONARY ARTERY BYPASS GRAFT  1999  . 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 HAND SURGERY     lt  palm,ring finger,  . HAND SURGERY Left    pinky finger  . HERNIA REPAIR  1996   lt/rt ing hernia  . LEFT HEART CATH AND CORS/GRAFTS ANGIOGRAPHY N/A 02/26/2018   Procedure: LEFT HEART CATH AND CORS/GRAFTS ANGIOGRAPHY;  Surgeon: Jettie Booze, MD;  Location: [place] [facility];  Service: Cardiovascular;  Laterality: N/A;  . LEFT HEART CATHETERIZATION WITH CORONARY/GRAFT ANGIOGRAM N/A 06/15/2013   Procedure: LEFT HEART CATHETERIZATION WITH Beatrix Fetters;  Surgeon: Candee Furbish, MD;  Location: [facility] C[facility];  Service: Cardiovascular;  Laterality: N/A;  . UPPER GASTROINTESTINAL ENDOSCOPY      Current Medications: Current Meds  Medication Sig  . albuterol (PROAIR HFA) 108 (90 Base) MCG/ACT inhaler Inhale 2 puffs into the lungs every 4 (four) hours as needed for wheezing or shortness of breath.  Marland Kitchen albuterol (PROVENTIL) (2.5 MG/3ML) 0.083% nebulizer solution Take 3 mLs (2.5 mg total) by nebulization every 6 (six) hours as needed for wheezing or shortness of breath.  . ALPRAZolam (XANAX) 0.5 MG tablet Take 0.5 mg by mouth at bedtime.   Marland Kitchen amoxicillin-clavulanate (AUGMENTIN) 875-125 MG tablet Take 1 tablet by mouth 2 (two) times daily.  Marland Kitchen antiseptic oral rinse (BIOTENE) LIQD 15 mLs by Mouth Rinse route daily.  Marland Kitchen  Ascorbic Acid (VITAMIN C) 1000 MG tablet Take 1,000 mg by mouth daily.  Marland Kitchen aspirin 81 MG tablet Take 81 mg by mouth at bedtime.   . Cholecalciferol (VITAMIN D3) 50 MCG (2000 UT) TABS Take 1 tablet by mouth daily.  . Coenzyme Q10 (CO Q 10) 100 MG CAPS Take 100 mg by mouth at bedtime.   . colesevelam (WELCHOL) 625 MG tablet TAKE 3 TABLETS BY MOUTH TWICE DAILY WITH MEALS  . doxepin (SINEQUAN) 50 MG capsule Take 100 mg by mouth at bedtime.   Marland Kitchen esomeprazole (NEXIUM) 20 MG capsule Take 20 mg by mouth 2 (two) times daily.  . furosemide (LASIX) 20 MG tablet TAKE 1 TABLET BY MOUTH ONCE DAILY AS NEEDED FOR  ANKLE  SWELLING  . HUMALOG 100 UNIT/ML injection 80 Units.   .  hydrochlorothiazide (HYDRODIURIL) 25 MG tablet Take 1 tablet (25 mg total) by mouth daily.  Marland Kitchen levothyroxine (SYNTHROID) 75 MCG tablet Take 75 mcg by mouth every morning.  Marland Kitchen losartan (COZAAR) 100 MG tablet Take 1 tablet (100 mg total) by mouth daily.  . metFORMIN (GLUCOPHAGE) 500 MG tablet Take 1 tablet (500 mg total) by mouth 2 (two) times daily with a meal.  . metoprolol succinate (TOPROL-XL) 50 MG 24 hr tablet TAKE 1 TABLET BY MOUTH ONCE DAILY WITH OR  IMMEDIATELY  FOLLOWING  A  MEAL  . Multiple Vitamins-Minerals (CENTRUM SILVER 50+MEN) TABS Take 1 tablet by mouth daily.  . nitroGLYCERIN (NITROSTAT) 0.4 MG SL tablet Place 1 tablet (0.4 mg total) under the tongue every 5 (five) minutes as needed for chest pain.  . Omega-3 Fatty Acids (FISH OIL) 600 MG CAPS Take 1,400 mg by mouth at bedtime.   . pravastatin (PRAVACHOL) 40 MG tablet Take 1 tablet by mouth once daily  . SSD 1 % cream Apply topically as needed.  . SYMBICORT 160-4.5 MCG/ACT inhaler INHALE 2 PUFFS BY MOUTH EVERY 12 HOURS, RINSE MOUTH AFTER EVERY USE.  . Tiotropium Bromide Monohydrate (SPIRIVA RESPIMAT) 2.5 MCG/ACT AERS Inhale 2 puffs into the lungs daily.   Current Facility-Administered Medications for the 09/06/20 encounter (Office Visit) with Jerline Pain, MD  Medication  . 0.9 %  sodium chloride infusion     Allergies:   Patient has no known allergies.   Social History   Socioeconomic History  . Marital status: Divorced    Spouse name: Not on file  . Number of children: 1  . Years of education: Not on file  . Highest education level: Not on file  Occupational History  . Occupation: Copywriter, advertising  Tobacco Use  . Smoking status: Former Smoker    Packs/day: 2.00    Years: 35.00    Pack years: 70.00    Types: Cigarettes    Quit date: 09/27/1997    Years since quitting: 22.9  . Smokeless tobacco: Never Used  Substance and Sexual Activity  . Alcohol use: Yes    Comment: occ  . Drug use: No   . Sexual activity: Not on file  Other Topics Concern  . Not on file  Social History Narrative  . Not on file   Social Determinants of Health   Financial Resource Strain: Not on file  Food Insecurity: Not on file  Transportation Needs: Not on file  Physical Activity: Not on file  Stress: Not on file  Social Connections: Not on file     Family History: The patient's family history includes Drug abuse in his brother. There  is no history of Heart disease, Heart failure, Diabetes, Hypertension, Colon cancer, Esophageal cancer, Prostate cancer, Pancreatic cancer, Kidney disease, Liver disease, Stomach cancer, or Rectal cancer.  ROS:   Please see the history of present illness.     All other systems reviewed and are negative.  EKGs/Labs/Other Studies Reviewed:    The following studies were reviewed today:  Cardiac catheterization 02/26/2018  Ost 1st Diag lesion is 75% stenosed. LIMA to diagonal is patent. Appears unchanged from prior cath.  Prox LAD lesion is 25% stenosed.  The left ventricular systolic function is normal.  LV end diastolic pressure is normal.  The left ventricular ejection fraction is 55-65% by visual estimate.  There is no aortic valve stenosis.  No change from prior cath.   Echocardiogram 05/24/20 EF 60-65, no RWMA, Gr 1 DD, normal RVSF, normal PASP  Myoview 05/24/20 Normal perfusion. LVEF 62% with normal wall motion. This is a low risk study. Compared to a prior study in 2014, ischemia is no longer present.  Cardiac catheterization 02/26/18 LAD prox 25; D1 ost 75 L-LAD patent  EF 55-65  Echocardiogram 01/28/18 Mild LVH, EF 55-65, no RWMA, trivial MR, normal RVSF, PASP 23  ECHO 2021:  1. Left ventricular ejection fraction, by estimation, is 60 to 65%. The  left ventricle has normal function. The left ventricle has no regional  wall motion abnormalities. Left ventricular diastolic parameters are  consistent with Grade I diastolic   dysfunction (impaired relaxation).  2. Right ventricular systolic function is normal. The right ventricular  size is normal. There is normal pulmonary artery systolic pressure.  3. The mitral valve is normal in structure. No evidence of mitral valve  regurgitation. No evidence of mitral stenosis.  4. The aortic valve is tricuspid. Aortic valve regurgitation is not  visualized. No aortic stenosis is present.  5. The inferior vena cava is normal in size with greater than 50%  respiratory variability, suggesting right atrial pressure of 3 mmHg.   NUC 2021:   Nuclear stress EF: 62%.  No T wave inversion was noted during stress.  There was no ST segment deviation noted during stress.  This is a low risk study.   Normal perfusion. LVEF 62% with normal wall motion. This is a low risk study. Compared to a prior study in 2014, ischemia is no longer present.   CPET 12/2/216 Conclusion: Exercise testing with gas exchange demonstrates a  significant functional impairment of atleast moderate severity  when compared to matched sedentary  norms. Pre-exercise spirometry demonstrates evidence of  severe obstruction. At peak exercise, the patient is severely  ventilatory limited due to obstructive lung physiology.     Recent Labs: 01/05/2020: ALT 30; Hemoglobin 13.4; Platelets 222 06/03/2020: BUN 18; Creatinine, Ser 1.20; Potassium 4.5; Sodium 136  Recent Lipid Panel    Component Value Date/Time   CHOL 135 01/05/2020 1045   TRIG 119 01/05/2020 1045   HDL 56 01/05/2020 1045   CHOLHDL 2.4 01/05/2020 1045   CHOLHDL 2.2 06/05/2016 1055   VLDL 18 06/05/2016 1055   LDLCALC 58 01/05/2020 1045     Risk Assessment/Calculations:      Physical Exam:    VS:  BP 130/60 (BP Location: Left Arm, Patient Position: Sitting, Cuff Size: Normal)   Pulse 89   Ht 5\' 9"  (1.753 m)   Wt 191 lb (86.6 kg)   SpO2 94%   BMI 28.21 kg/m     Wt Readings from Last 3 Encounters:  09/06/20 191 lb  (  86.6 kg)  08/24/20 192 lb (87.1 kg)  08/08/20 190 lb 6.4 oz (86.4 kg)     GEN:  Well nourished, well developed in no acute distress HEENT: Normal NECK: No JVD; No carotid bruits LYMPHATICS: No lymphadenopathy CARDIAC: RRR, no murmurs, rubs, gallops RESPIRATORY:  Clear to auscultation without rales, wheezing or rhonchi  ABDOMEN: Soft, non-tender, non-distended MUSCULOSKELETAL:  No edema; No deformity  SKIN: Warm and dry NEUROLOGIC:  Alert and oriented x 3 PSYCHIATRIC:  Normal affect   ASSESSMENT:    1. Coronary artery disease involving native coronary artery of native heart without angina pectoris   2. Shortness of breath   3. Chronic diastolic heart failure (HCC)    PLAN:    In order of problems listed above:   Coronary disease post CABG -Reviewed cardiac catheterization from July 2019 overall doing well without any significant chest discomfort.  Nuclear stress test reassuring in 2021.  Echocardiogram also reassuring.  Lower extremity edema -At last visit we stopped his amlodipine 5 mg because of worsening dependent edema.  Increased his losartan to 100 mg a day.  Essential hypertension -Stable, medications reviewed. Medications noted. At last visit we stopped his amlodipine 5 mg because of worsening dependent edema.  Increased his losartan to 100 mg a day. (Correction made to note)  COPD with shortness of breath -[name] has been monitoring. Medications reviewed.  Multifactorial shortness of breath.  Diastolic dysfunction noted on echocardiogram and felt better after thiazide diuretic was started.  He can also take furosemide as needed for significant leg swelling. --Symbicort, Spiriva.  Reviewed his note.  PFTs reviewed.  Severe airway restriction.  FEV1 is decreased since 2015.  Diabetes with hyperlipidemia -ALT 17, creatinine 1.16. Prior LDL 70, checking labs today  51-month follow-up       Medication Adjustments/Labs and Tests Ordered: Current  medicines are reviewed at length with the patient today.  Concerns regarding medicines are outlined above.  No orders of the defined types were placed in this encounter.  No orders of the defined types were placed in this encounter.   Patient Instructions  Medication Instructions:  The current medical regimen is effective;  continue present plan and medications.  *If you need a refill on your cardiac medications before your next appointment, please call your pharmacy*  Follow-Up: At [facility], you and your health needs are our priority.  As part of our continuing mission to provide you with exceptional heart care, we have created designated Provider Care Teams.  These Care Teams include your primary Cardiologist (physician) and Advanced Practice Providers (APPs -  Physician Assistants and Nurse Practitioners) who all work together to provide you with the care you need, when you need it.  We recommend signing up for the patient portal called "MyChart".  Sign up information is provided on this After Visit Summary.  MyChart is used to connect with patients for Virtual Visits (Telemedicine).  Patients are able to view lab/test results, encounter notes, upcoming appointments, etc.  Non-urgent messages can be sent to your provider as well.   To learn more about what you can do with MyChart, go to [email or web address].    Your next appointment:   6 month(s)  The format for your next appointment:   In Person  Provider:   Candee Furbish, MD  Thank you for choosing [facility]!!         Signed, Candee Furbish, MD  09/06/2020 1:58 PM    [place]

## 2020-09-06 NOTE — Patient Instructions (Addendum)

## 2020-09-07 ENCOUNTER — Ambulatory Visit (INDEPENDENT_AMBULATORY_CARE_PROVIDER_SITE_OTHER): Payer: Medicare Other | Admitting: Acute Care

## 2020-09-07 ENCOUNTER — Encounter: Payer: Self-pay | Admitting: Acute Care

## 2020-09-07 DIAGNOSIS — J449 Chronic obstructive pulmonary disease, unspecified: Secondary | ICD-10-CM | POA: Diagnosis not present

## 2020-09-07 DIAGNOSIS — R5381 Other malaise: Secondary | ICD-10-CM

## 2020-09-07 NOTE — Progress Notes (Signed)
Virtual Visit via Telephone Note  I connected with Oscar Robinson on 09/07/20 at  4:00 PM EST by telephone and verified that I am speaking with the correct person using two identifiers.  Location: Patient: home Provider: At home   I discussed the limitations, risks, security and privacy concerns of performing an evaluation and management service by telephone and the availability of in person appointments. I also discussed with the patient that there may be a patient responsible charge related to this service. The patient expressed understanding and agreed to proceed.  Synopsis Oscar Negro Nixonis a 75 y.o.maleformer smoker ( quit 1999 with a 70 pack year smoking history)withsevere COPD. He is followed by [name].  PMH: Type 2 diabetes, GERD Smoker/ Smoking History: Former smoker. Quit 1999. 70-pack-year smoking history Maintenance: Symbicort 160, Spiriva Handi Haler  History of Present Illness: Pt. Presents for follow up after CT Chest. This was done as he has had a slow to resolved COPD exacerbation, and there was concern for bronchiectasis, and PFT's had a reduced DLCO.  . This was negative for an acute process. He has not been able to produce any sputum for culture.  He presents today to discuss the results of his CT Chest. I explained that there were no acute findings. There was notation of Moderate centrilobular emphysema. Mild biapical pleuroparenchymal scarring. We reviewed his PFT's. He has not had to use his rescue inhaler since I last saw him. He had follow up with cardiology 09/06/2020, yesterday. We discussed that his dyspnea may be a result of deconditioning. He has not been able to exercise as much as in the past. He is in agreement to referral for Pulmonary Rehab.  He is compliant with his Symbicort and Spiriva.   Observations/Objective: 09/01/2020 CT Chest without contrast Lungs/Pleura: No pleural fluid. Moderate centrilobular emphysema. Mild biapical  pleuroparenchymal scarring. No acute process in the chest. Tiny hiatal hernia. Distal esophageal wall thickening, likely representing esophagitis. Aortic Atherosclerosis (ICD10-I70.0) and Emphysema (ICD10-J43.9).   ECHO 2021:  1. Left ventricular ejection fraction, by estimation, is 60 to 65%. The  left ventricle has normal function. The left ventricle has no regional  wall motion abnormalities. Left ventricular diastolic parameters are  consistent with Grade I diastolic  dysfunction (impaired relaxation).  2. Right ventricular systolic function is normal. The right ventricular  size is normal. There is normal pulmonary artery systolic pressure.  3. The mitral valve is normal in structure. No evidence of mitral valve  regurgitation. No evidence of mitral stenosis.  4. The aortic valve is tricuspid. Aortic valve regurgitation is not  visualized. No aortic stenosis is present.  5. The inferior vena cava is normal in size with greater than 50%  respiratory variability, suggesting right atrial pressure of 3 mmHg.     07/11/2020 CXR FINDINGS: Biapical pleuroparenchymal scarring and diffuse interstitial prominence. No focal consolidation, pneumothorax or pleural effusion. Postsurgical appearance of the cardiomediastinal silhouette is unchanged. Multilevel spondylosis.  IMPRESSION: Emphysema.  No focal consolidation.  01/28/2018-chest x-ray-no active pulmonary disease, mild pulmonary hyperinflation  03/03/2018-spirometry-ratio 59, FEV1 37, severe obstruction  08/24/2020  PFT's  Ratio 56, FEV1 35%, DLCO 58%, + BD response  Last cardiopulmonary exercise test was 07/08/2015, Showed that most of his exercise limitation was due to to ventilatory defect  Echocardiogram from 01/28/2018 was reviewed today and shows intact left ventricular function with a normal RV size and function, estimated PASP 23 mmHg.   08/26/2018-chest x-ray-COPD without acute abnormality  CPET  07/08/2015 Conclusion: Exercise  testing with gas exchange demonstrates a  significant functional impairment of atleast moderate severity  when compared to matched sedentary  norms. Pre-exercise spirometry demonstrates evidence of  severe obstruction. At peak exercise, the patient is severely  ventilatory limited due to obstructive lung physiology.   Pt is up to date on Covid vaccine and Booster.  Assessment and Plan: COPD>> slow to resolve Initially treated with Augmentin CT Chest with no acute findings Continue Symbicort 160 daily without fail Continue Spiriva Respimat 2.5 daily without fail Rinse mouth after use Use your rescue inhaler or nebulizer treatment as needed for shortness of breath or wheezing. OK to try to use this before you exert yourself to see if this helps.   Continue using Biotin Mouthwash for your dry mouth prior to bedtime We will refer you to Pulmonary rehab ( Order under [name]) If pulmonary rehab is  available in [place], that would be patient's preference.   Follow up with [name] in 3 months Please contact office for sooner follow up if symptoms do not improve or worsen or seek emergency care Call if you need [place] sooner.  Lower Extremity Edema Diastolic dysfunction noted on echocardiogram Plan Seen by cardiology 09/06/2020  Cardiac medications per Dr. Marlou Porch Follow up with Dr. Marlou Porch in 6 months.  Follow Up Instructions: Follow up with Dr. Marlou Porch in 6 months Follow up with [name] in     I discussed the assessment and treatment plan with the patient. The patient was provided an opportunity to ask questions and all were answered. The patient agreed with the plan and demonstrated an understanding of the instructions.   The patient was advised to call back or seek an in-person evaluation if the symptoms worsen or if the condition fails to improve as anticipated.  I provided 25 minutes of non-face-to-face time during this encounter.   Magdalen Spatz, NP  09/07/2020

## 2020-09-07 NOTE — Patient Instructions (Signed)
It was good to speak with you today Continue Symbicort 160 daily without fail Continue Spiriva Respimat 2.5 daily without fail Rinse mouth after use Use your rescue inhaler or nebulizer treatment as needed for shortness of breath or wheezing. OK to try to use this before you exert yourself to see if this helps.   Continue using Biotin Mouthwash for your dry mouth prior to bedtime We will refer you to Pulmonary rehab ( Order under [name]) If pulmonary rehab is  available in [place], that would be patient's preference.   Follow up with [name] in 3 months Follow up with Dr. Marlou Porch in 6 months Please contact office for sooner follow up if symptoms do not improve or worsen or seek emergency care Call if you need [place] sooner.

## 2020-09-13 DIAGNOSIS — I10 Essential (primary) hypertension: Secondary | ICD-10-CM | POA: Diagnosis not present

## 2020-09-13 DIAGNOSIS — E02 Subclinical iodine-deficiency hypothyroidism: Secondary | ICD-10-CM | POA: Diagnosis not present

## 2020-09-13 DIAGNOSIS — E7800 Pure hypercholesterolemia, unspecified: Secondary | ICD-10-CM | POA: Diagnosis not present

## 2020-09-13 DIAGNOSIS — Z9641 Presence of insulin pump (external) (internal): Secondary | ICD-10-CM | POA: Diagnosis not present

## 2020-09-13 DIAGNOSIS — E049 Nontoxic goiter, unspecified: Secondary | ICD-10-CM | POA: Diagnosis not present

## 2020-09-13 DIAGNOSIS — E1165 Type 2 diabetes mellitus with hyperglycemia: Secondary | ICD-10-CM | POA: Diagnosis not present

## 2020-09-13 DIAGNOSIS — M19049 Primary osteoarthritis, unspecified hand: Secondary | ICD-10-CM | POA: Diagnosis not present

## 2020-09-13 NOTE — Progress Notes (Signed)
These results were discussed with the patient in full 09/07/2020, Please see televisit note dated 09/07/2020

## 2020-10-08 LAB — AFB CULTURE WITH SMEAR (NOT AT ARMC)
Acid Fast Culture: NEGATIVE
Acid Fast Smear: NEGATIVE

## 2020-10-11 NOTE — Progress Notes (Signed)
Please let patient know his sputum cultures are negative for AFB. Thanks so much

## 2020-10-12 ENCOUNTER — Encounter: Payer: Self-pay | Admitting: *Deleted

## 2020-10-14 ENCOUNTER — Telehealth: Payer: Self-pay | Admitting: Emergency Medicine

## 2020-10-14 MED ORDER — PREDNISONE 10 MG PO TABS: ORAL_TABLET | ORAL | 0 refills | Status: AC

## 2020-10-14 MED ORDER — DOXYCYCLINE HYCLATE 100 MG PO TABS: 100.0000 mg | ORAL_TABLET | Freq: Two times a day (BID) | ORAL | 0 refills | Status: DC

## 2020-10-14 NOTE — Telephone Encounter (Signed)
Spoke with the pt  He is c/o increased cough x 5 days  He states feels esp worse over the past 2 days- having increased SOB, wheezing, chest tightness and producing yellow sputum  He denies any f/c/s, aches  Has been fully vaccinated against covid including booster  He is compliant with symbicort and  spiriva and has started to use his albuterol inhaler and neb recently  Please advise thanks

## 2020-10-14 NOTE — Telephone Encounter (Signed)
Called spoke with patient  Let him know [name] recommendations.  Verified pharmacy Orders placed Nothing further needed at this time.

## 2020-10-14 NOTE — Telephone Encounter (Signed)
Please have him take doxycycline 100mg  bid x 7 days and pred as below: Take 40mg  daily for 3 days, then 30mg  daily for 3 days, then 20mg  daily for 3 days, then 10mg  daily for 3 days, then stop

## 2020-10-20 ENCOUNTER — Telehealth: Payer: Self-pay | Admitting: Emergency Medicine

## 2020-10-20 NOTE — Telephone Encounter (Signed)
Will forward to [name] to give FYI of pt's kind words. Nothing further needed.

## 2020-10-20 NOTE — Telephone Encounter (Signed)
Very glad to hear. Thanks for letting me know.

## 2020-11-05 ENCOUNTER — Other Ambulatory Visit: Payer: Self-pay | Admitting: Emergency Medicine

## 2020-11-15 DIAGNOSIS — E119 Type 2 diabetes mellitus without complications: Secondary | ICD-10-CM | POA: Diagnosis not present

## 2020-11-15 DIAGNOSIS — L851 Acquired keratosis [keratoderma] palmaris et plantaris: Secondary | ICD-10-CM | POA: Diagnosis not present

## 2020-12-13 DIAGNOSIS — R531 Weakness: Secondary | ICD-10-CM | POA: Diagnosis not present

## 2020-12-13 DIAGNOSIS — Z9641 Presence of insulin pump (external) (internal): Secondary | ICD-10-CM | POA: Diagnosis not present

## 2020-12-13 DIAGNOSIS — E1165 Type 2 diabetes mellitus with hyperglycemia: Secondary | ICD-10-CM | POA: Diagnosis not present

## 2020-12-13 DIAGNOSIS — E049 Nontoxic goiter, unspecified: Secondary | ICD-10-CM | POA: Diagnosis not present

## 2020-12-13 DIAGNOSIS — E02 Subclinical iodine-deficiency hypothyroidism: Secondary | ICD-10-CM | POA: Diagnosis not present

## 2020-12-13 DIAGNOSIS — E7800 Pure hypercholesterolemia, unspecified: Secondary | ICD-10-CM | POA: Diagnosis not present

## 2020-12-13 DIAGNOSIS — M19049 Primary osteoarthritis, unspecified hand: Secondary | ICD-10-CM | POA: Diagnosis not present

## 2020-12-13 DIAGNOSIS — I10 Essential (primary) hypertension: Secondary | ICD-10-CM | POA: Diagnosis not present

## 2020-12-31 DIAGNOSIS — J441 Chronic obstructive pulmonary disease with (acute) exacerbation: Secondary | ICD-10-CM | POA: Diagnosis not present

## 2020-12-31 DIAGNOSIS — R0600 Dyspnea, unspecified: Secondary | ICD-10-CM | POA: Diagnosis not present

## 2020-12-31 DIAGNOSIS — R062 Wheezing: Secondary | ICD-10-CM | POA: Diagnosis not present

## 2020-12-31 DIAGNOSIS — E1165 Type 2 diabetes mellitus with hyperglycemia: Secondary | ICD-10-CM | POA: Diagnosis not present

## 2021-01-17 ENCOUNTER — Other Ambulatory Visit: Payer: Self-pay | Admitting: Cardiology

## 2021-01-26 ENCOUNTER — Other Ambulatory Visit: Payer: Self-pay | Admitting: Cardiology

## 2021-02-06 ENCOUNTER — Other Ambulatory Visit: Payer: Self-pay | Admitting: Pulmonary Disease

## 2021-02-13 ENCOUNTER — Other Ambulatory Visit: Payer: Self-pay | Admitting: Cardiology

## 2021-02-14 DIAGNOSIS — U071 COVID-19: Secondary | ICD-10-CM | POA: Diagnosis not present

## 2021-02-14 NOTE — Telephone Encounter (Signed)
Rx(s) sent to pharmacy electronically.  

## 2021-03-04 NOTE — Progress Notes (Signed)
Cardiology Office Note   Date:  03/06/2021   ID:  Oscar Robinson, DOB [birth year], MRN [id number]  PCP:  [name], MD  Cardiologist:  Dr. Marlou Porch, MD   Chief Complaint  Patient presents with   Follow-up    History of Present Illness: Oscar Robinson is a 75 y.o. male who presents for follow up, seen for Dr. Marlou Porch.   Mr. Longton has a hx of CAD s/p CABG 1999 with LIMA to LAD, COPD followed by [name]. He last underwent LHC 02/2018 that showed patent graft with no change since last evaluation. Echocardiogram from 05/2020 with normal LVEF, G1DD and no RWMA. Stress test from 05/2020 with normal perfusion and normal wall motion with no ischemia or infarct.   He was last seen by Dr. Marlou Porch 09/06/2020 at which time he had SOB felt to be multifactorial due to diastolic dysfunction and COPD. PFTs reviewed by Dr. Magdalen Spatz which showed severe airway restriction. He saw pulmonary 09/07/20. Chest CT showed centrilobular emphysema. Dyspnea again felt to be multifactorial with deconditioning playing a role. He was referred to pulmonary rehabilitation.    He is here today for follow-up with no specific complaints.  Continues to have DOE which seems to be worse in the middle of the day.  He does most of his activities and early morning early afternoon which helps.  BP is slightly elevated today.  He reports he will get at home cuff and begin checking this more regularly.  I have asked that he call me in the next 1 to 2 weeks with daily BPs.  Also is having some mild lower extremity edema.  He has been taking 20 mg Lasix as needed approximately once per week.  He reports this mostly helps but has been nervous about increasing this.  Last lab work appears stable.  I have asked that he increases this from 1 to 2 tablets as needed for swelling.  He denies orthopnea, palpitations, chest pain, dizziness, presyncopal or syncopal episodes.   Past Medical History:  Diagnosis Date   Anemia    Anxiety     Arthritis    CKD (chronic kidney disease), stage II    COPD (chronic obstructive pulmonary disease) ([place])    Coronary artery disease    a. s/p CABG 1999.   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   Procedure: DUPUYTREN CONTRACTURE RELEASE;  Surgeon: Cammie Sickle., MD;  Location: [facility];  Service: Orthopedics;  Laterality: Right;  palm, ring and small fingers dupuytrens contracture release   HAND SURGERY     lt palm,ring finger,   HAND SURGERY Left    pinky finger   HERNIA REPAIR  1996   lt/rt ing hernia  LEFT HEART CATH AND CORS/GRAFTS ANGIOGRAPHY N/A 02/26/2018   Procedure: LEFT HEART CATH AND CORS/GRAFTS ANGIOGRAPHY;  Surgeon: Jettie Booze, MD;  Location: [facility];  Service: Cardiovascular;  Laterality: N/A;   LEFT HEART CATHETERIZATION WITH CORONARY/GRAFT ANGIOGRAM N/A 06/15/2013   Procedure: LEFT HEART CATHETERIZATION WITH Beatrix Fetters;  Surgeon: Candee Furbish, MD;  Location: [facility] CATH LAB;  Service: Cardiovascular;  Laterality: N/A;   UPPER GASTROINTESTINAL ENDOSCOPY       Current Outpatient Medications  Medication Sig Dispense Refill   albuterol (PROAIR HFA) 108 (90 Base) MCG/ACT inhaler Inhale 2 puffs into the lungs every 4 (four) hours as needed for wheezing or shortness of breath. 1 each 3   albuterol (PROVENTIL) (2.5 MG/3ML) 0.083% nebulizer solution Take 3  mLs (2.5 mg total) by nebulization every 6 (six) hours as needed for wheezing or shortness of breath. 360 mL 12   ALPRAZolam (XANAX) 0.5 MG tablet Take 0.5 mg by mouth at bedtime.      antiseptic oral rinse (BIOTENE) LIQD 15 mLs by Mouth Rinse route daily.     Ascorbic Acid (VITAMIN C) 1000 MG tablet Take 1,000 mg by mouth daily.     aspirin 81 MG tablet Take 81 mg by mouth at bedtime.      Cholecalciferol (VITAMIN D3) 50 MCG (2000 UT) TABS Take 1 tablet by mouth daily.     Coenzyme Q10 (CO Q 10) 100 MG CAPS Take 100 mg by mouth at bedtime.      colesevelam (WELCHOL) 625 MG tablet TAKE 3 TABLETS BY MOUTH TWICE DAILY WITH MEALS 540 tablet 0   doxepin (SINEQUAN) 50 MG capsule Take 100 mg by mouth at bedtime.      esomeprazole (NEXIUM) 20 MG capsule Take 20 mg by mouth 2 (two) times daily.     furosemide (LASIX) 40 MG tablet Take 1 tablet (40 mg total) by mouth daily as needed (Ankle Swelling). 90 tablet 3   HUMALOG 100 UNIT/ML injection 80 Units.   6   hydrochlorothiazide (HYDRODIURIL) 25 MG tablet Take 1 tablet (25 mg total) by mouth daily. 90 tablet 3   levothyroxine (SYNTHROID) 88 MCG tablet Take 88 mcg by mouth every morning.     losartan (COZAAR) 100 MG tablet Take 1 tablet by mouth once daily 90 tablet 0   metFORMIN (GLUCOPHAGE) 500 MG tablet Take 1 tablet (500 mg total) by mouth 2 (two) times daily with a meal.     metoprolol succinate (TOPROL-XL) 50 MG 24 hr tablet TAKE 1 TABLET BY MOUTH ONCE DAILY WITH OR  IMMEDIATELY  FOLLOWING  A  MEAL 90 tablet 3   Multiple Vitamins-Minerals (CENTRUM SILVER 50+MEN) TABS Take 1 tablet by mouth daily.     nitroGLYCERIN (NITROSTAT) 0.4 MG SL tablet Place 1 tablet (0.4 mg total) under the tongue every 5 (five) minutes as needed for chest pain. 25 tablet 3   Omega-3 Fatty Acids (FISH OIL) 600 MG CAPS Take 1,400 mg by mouth at bedtime.      pravastatin (PRAVACHOL) 40 MG tablet Take 1 tablet by mouth once daily 90 tablet 3   SPIRIVA RESPIMAT 2.5 MCG/ACT AERS  INHALE 2 SPRAY(S) BY MOUTH ONCE DAILY 4 g 7   SYMBICORT 160-4.5 MCG/ACT inhaler INHALE 2 PUFFS BY MOUTH EVERY 12 HOURS, RINSE MOUTH AFTER EVERY USE. 11 g 5   doxycycline (VIBRA-TABS) 100 MG tablet Take 1 tablet (100 mg total) by mouth 2 (two) times daily. (Patient not taking: Reported on 03/06/2021) 14 tablet  0   SSD 1 % cream Apply topically as needed. (Patient not taking: Reported on 03/06/2021)     Current Facility-Administered Medications  Medication Dose Route Frequency Provider Last Rate Last Admin   0.9 %  sodium chloride infusion  500 mL Intravenous Continuous Milus Banister, MD        Allergies:   Patient has no known allergies.    Social History:  The patient  reports that he quit smoking about 23 years ago. His smoking use included cigarettes. He has a 70.00 pack-year smoking history. He has never used smokeless tobacco. He reports current alcohol use. He reports that he does not use drugs.   Family History:  The patient's family history includes Drug abuse in his brother.    ROS:  Please see the history of present illness.  Otherwise, review of systems are positive for none.   All other systems are reviewed and negative.    PHYSICAL EXAM: VS:  BP (!) 152/82   Pulse 75   Ht 5' 9"  (1.753 m)   Wt 186 lb 6.4 oz (84.6 kg)   SpO2 99%   BMI 27.53 kg/m  , BMI Body mass index is 27.53 kg/m.  General: Well developed, well nourished, NAD Neck: Negative for carotid bruits. No JVD Lungs:Clear to ausculation bilaterally. No wheezes, rales, or rhonchi. Breathing is unlabored. Cardiovascular: RRR with S1 S2. No murmurs Extremities: No edema.  Neuro: Alert and oriented. No focal deficits. No facial asymmetry. MAE spontaneously. Psych: Responds to questions appropriately with normal affect.     EKG:  EKG is not ordered today.   Recent Labs: 06/03/2020: BUN 18; Creatinine, Ser 1.20; Potassium 4.5; Sodium 136    Lipid Panel    Component Value Date/Time   CHOL 135 01/05/2020  1045   TRIG 119 01/05/2020 1045   HDL 56 01/05/2020 1045   CHOLHDL 2.4 01/05/2020 1045   CHOLHDL 2.2 06/05/2016 1055   VLDL 18 06/05/2016 1055   LDLCALC 58 01/05/2020 1045      Wt Readings from Last 3 Encounters:  03/06/21 186 lb 6.4 oz (84.6 kg)  09/06/20 191 lb (86.6 kg)  08/24/20 192 lb (87.1 kg)    Other studies Reviewed: Additional studies/ records that were reviewed today include: . Review of the above records demonstrates:    Cardiac catheterization 02/26/2018 Ost 1st Diag lesion is 75% stenosed. LIMA to diagonal is patent. Appears unchanged from prior cath. Prox LAD lesion is 25% stenosed. The left ventricular systolic function is normal. LV end diastolic pressure is normal. The left ventricular ejection fraction is 55-65% by visual estimate. There is no aortic valve stenosis.   No change from prior cath.    Echocardiogram 05/24/20 EF 60-65, no RWMA, Gr 1 DD, normal RVSF, normal PASP   Myoview 05/24/20 Normal perfusion. LVEF 62% with normal wall motion. This is a low risk study. Compared to a prior study in 2014, ischemia is no longer present.   Cardiac catheterization 02/26/18 LAD prox 25; D1 ost 75 L-LAD patent EF 55-65   Echocardiogram 01/28/18 Mild LVH, EF 55-65, no RWMA, trivial MR, normal RVSF, PASP 23   ECHO 2021:   1. Left ventricular ejection fraction, by estimation, is 60 to 65%. The  left ventricle has normal function. The left ventricle has no regional  wall motion abnormalities. Left ventricular diastolic parameters are  consistent with Grade I diastolic  dysfunction (impaired relaxation).   2. Right ventricular systolic function is normal. The right ventricular  size is normal. There is normal pulmonary artery systolic pressure.   3. The mitral valve is normal in structure. No evidence of mitral valve  regurgitation. No evidence of mitral stenosis.   4. The aortic valve is tricuspid. Aortic valve regurgitation is not  visualized. No aortic  stenosis is present.   5. The inferior vena cava is normal in size with greater than 50%  respiratory variability, suggesting right atrial pressure of 3 mmHg.   NUC 2021:   Nuclear stress EF: 62%. No T wave inversion was noted during stress. There was no ST segment deviation noted during stress. This is a low risk study.   Normal perfusion. LVEF 62% with normal wall motion. This is a low risk study. Compared to a prior study in 2014, ischemia is no longer present.     CPET 12/2/216 Conclusion: Exercise testing with gas exchange demonstrates a  significant functional impairment of atleast moderate severity  when compared to matched sedentary  norms. Pre-exercise spirometry demonstrates evidence of  severe obstruction. At peak exercise, the patient is severely  ventilatory limited due to obstructive lung physiology.  ASSESSMENT AND PLAN:  1.  CAD s/p remote CABG: -Last cardiac catheterization 02/2018 without significant change.  Nuclear stress test from 2021 reassuring with no ischemia or infarct. -Denies recent anginal symptoms -Continue current regimen  2.  LE edema: -Appears this has been a chronic, ongoing issue.  Amlodipine was discontinued at last office visit and losartan was increased to 100 mg a day.  He was previously on Lasix 20 mg as needed for edema.  I instructed that he may take 1 to 2 tablets/day for edema.  Also encouraged leg elevation, compression stockings and sodium reduction.  3.  HTN: -Elevated today at 152/82 -Patient reports he will get a home BP cuff.  We discussed proper BP technique.  He will call with 1 to 2-week course of BP readings.  May need medication titration however could also be whitecoat syndrome.  4.  COPD with chronic shortness of breath: -Follows closely with pulmonary medicine, [name].  He was recently seen in follow-up with shortness of breath felt to be multifactorial.  PFTs with emphysema and severe airway restriction.  Referred to  pulmonary rehabilitation.  5.  HLD: -Last LDL, 58 from 01/05/2020 -Plan to repeat lipid panel on return in the next 1 to 2 weeks as patient has eaten today  6.  DM2: -Follows closely with PCP   Current medicines are reviewed at length with the patient today.  The patient does not have concerns regarding medicines.  The following changes have been made: Increase as needed Lasix to 1 to 2 tablets or 20 to 40 mg as needed for ankle edema  Labs/ tests ordered today include: C-Met, lipid panel, CBC  Orders Placed This Encounter  Procedures   CBC   Comprehensive metabolic panel   Lipid panel    Disposition:   FU with Dr. Marlou Porch in 4 months  Signed, Kathyrn Drown, NP  03/06/2021 4:45 PM    [address], [place], [place]  [address] Phone: [phone]; Fax: [phone]

## 2021-03-06 ENCOUNTER — Encounter: Payer: Self-pay | Admitting: Cardiology

## 2021-03-06 ENCOUNTER — Other Ambulatory Visit: Payer: Self-pay

## 2021-03-06 ENCOUNTER — Ambulatory Visit (INDEPENDENT_AMBULATORY_CARE_PROVIDER_SITE_OTHER): Payer: Medicare Other | Admitting: Cardiology

## 2021-03-06 VITALS — BP 152/82 | HR 75 | Ht 69.0 in | Wt 186.4 lb

## 2021-03-06 DIAGNOSIS — E785 Hyperlipidemia, unspecified: Secondary | ICD-10-CM

## 2021-03-06 DIAGNOSIS — E7800 Pure hypercholesterolemia, unspecified: Secondary | ICD-10-CM | POA: Diagnosis not present

## 2021-03-06 DIAGNOSIS — I2510 Atherosclerotic heart disease of native coronary artery without angina pectoris: Secondary | ICD-10-CM

## 2021-03-06 DIAGNOSIS — I5032 Chronic diastolic (congestive) heart failure: Secondary | ICD-10-CM | POA: Diagnosis not present

## 2021-03-06 DIAGNOSIS — N182 Chronic kidney disease, stage 2 (mild): Secondary | ICD-10-CM

## 2021-03-06 MED ORDER — FUROSEMIDE 40 MG PO TABS: 40.0000 mg | ORAL_TABLET | Freq: Every day | ORAL | 3 refills | Status: DC | PRN

## 2021-03-06 NOTE — Patient Instructions (Signed)
Medication Instructions:  Your physician has recommended you make the following change in your medication:  INCREASE LASIX TO 40 MG AS  NEEDED FOR SWELLING.   *If you need a refill on your cardiac medications before your next appointment, please call your pharmacy*   Lab Work: TO BE DONE IN 1 WEEK: LIPIDS, CMET, CBC If you have labs (blood work) drawn today and your tests are completely normal, you will receive your results only by: [place] (if you have MyChart) OR A paper copy in the mail If you have any lab test that is abnormal or we need to change your treatment, we will call you to review the results.   Testing/Procedures: NONE   Follow-Up: At [facility], you and your health needs are our priority.  As part of our continuing mission to provide you with exceptional heart care, we have created designated Provider Care Teams.  These Care Teams include your primary Cardiologist (physician) and Advanced Practice Providers (APPs -  Physician Assistants and Nurse Practitioners) who all work together to provide you with the care you need, when you need it.  We recommend signing up for the patient portal called "MyChart".  Sign up information is provided on this After Visit Summary.  MyChart is used to connect with patients for Virtual Visits (Telemedicine).  Patients are able to view lab/test results, encounter notes, upcoming appointments, etc.  Non-urgent messages can be sent to your provider as well.   To learn more about what you can do with MyChart, go to [email or web address].    Your next appointment:   3 month(s)  The format for your next appointment:   In Person  Provider:   You may see Candee Furbish, MD or one of the following Advanced Practice Providers on your designated Care Team:   Cecilie Kicks, NP ANY APP

## 2021-03-13 ENCOUNTER — Other Ambulatory Visit: Payer: Self-pay | Admitting: Pulmonary Disease

## 2021-03-14 ENCOUNTER — Other Ambulatory Visit: Payer: Self-pay

## 2021-03-14 ENCOUNTER — Other Ambulatory Visit: Payer: Medicare Other

## 2021-03-14 DIAGNOSIS — E785 Hyperlipidemia, unspecified: Secondary | ICD-10-CM | POA: Diagnosis not present

## 2021-03-14 DIAGNOSIS — I5032 Chronic diastolic (congestive) heart failure: Secondary | ICD-10-CM | POA: Diagnosis not present

## 2021-03-14 DIAGNOSIS — E7800 Pure hypercholesterolemia, unspecified: Secondary | ICD-10-CM

## 2021-03-14 DIAGNOSIS — N182 Chronic kidney disease, stage 2 (mild): Secondary | ICD-10-CM

## 2021-03-14 DIAGNOSIS — I2510 Atherosclerotic heart disease of native coronary artery without angina pectoris: Secondary | ICD-10-CM

## 2021-03-14 LAB — COMPREHENSIVE METABOLIC PANEL
ALT: 20 IU/L (ref 0–44)
AST: 22 IU/L (ref 0–40)
Albumin/Globulin Ratio: 1.8 (ref 1.2–2.2)
Albumin: 4 g/dL (ref 3.7–4.7)
Alkaline Phosphatase: 65 IU/L (ref 44–121)
BUN/Creatinine Ratio: 13 (ref 10–24)
BUN: 17 mg/dL (ref 8–27)
Bilirubin Total: 0.3 mg/dL (ref 0.0–1.2)
CO2: 24 mmol/L (ref 20–29)
Calcium: 8.9 mg/dL (ref 8.6–10.2)
Chloride: 103 mmol/L (ref 96–106)
Creatinine, Ser: 1.3 mg/dL — ABNORMAL HIGH (ref 0.76–1.27)
Globulin, Total: 2.2 g/dL (ref 1.5–4.5)
Glucose: 156 mg/dL — ABNORMAL HIGH (ref 65–99)
Potassium: 4.3 mmol/L (ref 3.5–5.2)
Sodium: 140 mmol/L (ref 134–144)
Total Protein: 6.2 g/dL (ref 6.0–8.5)
eGFR: 58 mL/min/{1.73_m2} — ABNORMAL LOW (ref >59)

## 2021-03-14 LAB — CBC
Hematocrit: 39.2 % (ref 37.5–51.0)
Hemoglobin: 12.9 g/dL — ABNORMAL LOW (ref 13.0–17.7)
MCH: 30.3 pg (ref 26.6–33.0)
MCHC: 32.9 g/dL (ref 31.5–35.7)
MCV: 92 fL (ref 79–97)
Platelets: 227 10*3/uL (ref 150–450)
RBC: 4.26 x10E6/uL (ref 4.14–5.80)
RDW: 14.1 % (ref 11.6–15.4)
WBC: 7.2 10*3/uL (ref 3.4–10.8)

## 2021-03-14 LAB — LIPID PANEL
Chol/HDL Ratio: 2.5 ratio (ref 0.0–5.0)
Cholesterol, Total: 143 mg/dL (ref 100–199)
HDL: 57 mg/dL (ref >39)
LDL Chol Calc (NIH): 67 mg/dL (ref 0–99)
Triglycerides: 107 mg/dL (ref 0–149)
VLDL Cholesterol Cal: 19 mg/dL (ref 5–40)

## 2021-03-16 DIAGNOSIS — E7800 Pure hypercholesterolemia, unspecified: Secondary | ICD-10-CM | POA: Diagnosis not present

## 2021-03-16 DIAGNOSIS — E1165 Type 2 diabetes mellitus with hyperglycemia: Secondary | ICD-10-CM | POA: Diagnosis not present

## 2021-03-16 DIAGNOSIS — E049 Nontoxic goiter, unspecified: Secondary | ICD-10-CM | POA: Diagnosis not present

## 2021-03-16 DIAGNOSIS — E02 Subclinical iodine-deficiency hypothyroidism: Secondary | ICD-10-CM | POA: Diagnosis not present

## 2021-03-16 DIAGNOSIS — I10 Essential (primary) hypertension: Secondary | ICD-10-CM | POA: Diagnosis not present

## 2021-03-16 DIAGNOSIS — Z9641 Presence of insulin pump (external) (internal): Secondary | ICD-10-CM | POA: Diagnosis not present

## 2021-04-06 DIAGNOSIS — M7741 Metatarsalgia, right foot: Secondary | ICD-10-CM | POA: Diagnosis not present

## 2021-04-06 DIAGNOSIS — E119 Type 2 diabetes mellitus without complications: Secondary | ICD-10-CM | POA: Diagnosis not present

## 2021-04-06 DIAGNOSIS — M7742 Metatarsalgia, left foot: Secondary | ICD-10-CM | POA: Diagnosis not present

## 2021-04-06 DIAGNOSIS — L851 Acquired keratosis [keratoderma] palmaris et plantaris: Secondary | ICD-10-CM | POA: Diagnosis not present

## 2021-04-13 ENCOUNTER — Other Ambulatory Visit: Payer: Self-pay | Admitting: Cardiology

## 2021-05-07 DIAGNOSIS — J9601 Acute respiratory failure with hypoxia: Secondary | ICD-10-CM | POA: Diagnosis not present

## 2021-05-07 DIAGNOSIS — Z79899 Other long term (current) drug therapy: Secondary | ICD-10-CM | POA: Diagnosis not present

## 2021-05-07 DIAGNOSIS — J441 Chronic obstructive pulmonary disease with (acute) exacerbation: Secondary | ICD-10-CM | POA: Diagnosis not present

## 2021-05-07 DIAGNOSIS — R6520 Severe sepsis without septic shock: Secondary | ICD-10-CM | POA: Diagnosis not present

## 2021-05-07 DIAGNOSIS — Z792 Long term (current) use of antibiotics: Secondary | ICD-10-CM | POA: Diagnosis not present

## 2021-05-07 DIAGNOSIS — R0902 Hypoxemia: Secondary | ICD-10-CM | POA: Diagnosis not present

## 2021-05-07 DIAGNOSIS — R062 Wheezing: Secondary | ICD-10-CM | POA: Diagnosis not present

## 2021-05-07 DIAGNOSIS — R9431 Abnormal electrocardiogram [ECG] [EKG]: Secondary | ICD-10-CM | POA: Diagnosis not present

## 2021-05-07 DIAGNOSIS — J189 Pneumonia, unspecified organism: Secondary | ICD-10-CM | POA: Diagnosis not present

## 2021-05-07 DIAGNOSIS — E1165 Type 2 diabetes mellitus with hyperglycemia: Secondary | ICD-10-CM | POA: Diagnosis not present

## 2021-05-07 DIAGNOSIS — Z87891 Personal history of nicotine dependence: Secondary | ICD-10-CM | POA: Diagnosis not present

## 2021-05-07 DIAGNOSIS — R000 Tachycardia, unspecified: Secondary | ICD-10-CM | POA: Diagnosis not present

## 2021-05-07 DIAGNOSIS — J440 Chronic obstructive pulmonary disease with acute lower respiratory infection: Secondary | ICD-10-CM | POA: Diagnosis not present

## 2021-05-07 DIAGNOSIS — R0689 Other abnormalities of breathing: Secondary | ICD-10-CM | POA: Diagnosis not present

## 2021-05-07 DIAGNOSIS — Z7952 Long term (current) use of systemic steroids: Secondary | ICD-10-CM | POA: Diagnosis not present

## 2021-05-07 DIAGNOSIS — I2510 Atherosclerotic heart disease of native coronary artery without angina pectoris: Secondary | ICD-10-CM | POA: Diagnosis not present

## 2021-05-07 DIAGNOSIS — E785 Hyperlipidemia, unspecified: Secondary | ICD-10-CM | POA: Diagnosis not present

## 2021-05-07 DIAGNOSIS — Z951 Presence of aortocoronary bypass graft: Secondary | ICD-10-CM | POA: Diagnosis not present

## 2021-05-07 DIAGNOSIS — I10 Essential (primary) hypertension: Secondary | ICD-10-CM | POA: Diagnosis not present

## 2021-05-07 DIAGNOSIS — R0603 Acute respiratory distress: Secondary | ICD-10-CM | POA: Diagnosis not present

## 2021-05-07 DIAGNOSIS — A419 Sepsis, unspecified organism: Secondary | ICD-10-CM | POA: Diagnosis not present

## 2021-05-07 DIAGNOSIS — Z794 Long term (current) use of insulin: Secondary | ICD-10-CM | POA: Diagnosis not present

## 2021-05-15 ENCOUNTER — Encounter: Payer: Self-pay | Admitting: Adult Health

## 2021-05-15 ENCOUNTER — Other Ambulatory Visit: Payer: Self-pay

## 2021-05-15 ENCOUNTER — Ambulatory Visit (INDEPENDENT_AMBULATORY_CARE_PROVIDER_SITE_OTHER): Payer: Medicare Other

## 2021-05-15 ENCOUNTER — Ambulatory Visit (INDEPENDENT_AMBULATORY_CARE_PROVIDER_SITE_OTHER): Payer: Medicare Other | Admitting: Adult Health

## 2021-05-15 VITALS — BP 132/74 | HR 94 | Temp 98.4°F | Ht 69.0 in | Wt 188.0 lb

## 2021-05-15 DIAGNOSIS — Z23 Encounter for immunization: Secondary | ICD-10-CM

## 2021-05-15 DIAGNOSIS — R0602 Shortness of breath: Secondary | ICD-10-CM | POA: Diagnosis not present

## 2021-05-15 DIAGNOSIS — I2510 Atherosclerotic heart disease of native coronary artery without angina pectoris: Secondary | ICD-10-CM

## 2021-05-15 DIAGNOSIS — J441 Chronic obstructive pulmonary disease with (acute) exacerbation: Secondary | ICD-10-CM

## 2021-05-15 DIAGNOSIS — J189 Pneumonia, unspecified organism: Secondary | ICD-10-CM | POA: Diagnosis not present

## 2021-05-15 DIAGNOSIS — R059 Cough, unspecified: Secondary | ICD-10-CM

## 2021-05-15 NOTE — Assessment & Plan Note (Signed)
Recent right lower lobe community-acquired pneumonia.  Patient was hospitalized has completed a full course of antibiotics.  Clinically is improving.  Chest x-ray today shows improvement in right lower lobe airspace disease.  We will repeat chest x-ray in 4 weeks for complete clearance  Plan  Patient Instructions  Continue on Symbicort and Spiriva  Mucinex DM Twice daily  As needed  cough/congestion  Albuterol inhaler or Neb As needed  Wheezing .  Activity as tolerated.  Flu shot today .  Follow up with [name]  In 4 weeks with chest xray and As needed   Please contact office for sooner follow up if symptoms do not improve or worsen or seek emergency care

## 2021-05-15 NOTE — Assessment & Plan Note (Signed)
Recent exacerbation with community-acquired pneumonia.  Patient is improving.   Would continue on current maintenance regimen.  Add Mucinex DM twice daily Flu shot today  Plan  Patient Instructions  Continue on Symbicort and Spiriva  Mucinex DM Twice daily  As needed  cough/congestion  Albuterol inhaler or Neb As needed  Wheezing .  Activity as tolerated.  Flu shot today .  Follow up with [name]  In 4 weeks with chest xray and As needed   Please contact office for sooner follow up if symptoms do not improve or worsen or seek emergency care

## 2021-05-15 NOTE — Patient Instructions (Addendum)
Continue on Symbicort and Spiriva  Mucinex DM Twice daily  As needed  cough/congestion  Albuterol inhaler or Neb As needed  Wheezing .  Activity as tolerated.  Flu shot today .  Follow up with [name]  In 4 weeks with chest xray and As needed   Please contact office for sooner follow up if symptoms do not improve or worsen or seek emergency care

## 2021-05-15 NOTE — Progress Notes (Signed)
@Patient  ID: Oscar Robinson, male    DOB: 03-Jul-1946, 75 y.o.   MRN: [id number]  Chief Complaint  Patient presents with   Follow-up    Referring provider: [name], MD  HPI: 75 year old male former smoker followed for severe COPD Medical history significant for coronary disease status post CABG, GERD, insulin-dependent diabetes on insulin pump   TEST/EVENTS :  03/03/2018-spirometry- ratio 59, FEV1 37, severe obstruction   08/24/2020  PFT's  Ratio 56, FEV1 35%, DLCO 58%, + BD response   Last cardiopulmonary exercise test was 07/08/2015, Showed that most of his exercise limitation was due to to ventilatory defect   Echocardiogram from 01/28/2018 was reviewed today and shows intact left ventricular function with a normal RV size and function, estimated PASP 23 mmHg.   05/15/2021 Follow up ; COPD , Post hospital follow-up Patient presents for a follow-up visit.  He was recently hospitalized earlier this month for a COPD exacerbation and community-acquired pneumonia.  He was treated with IV antibiotics and discharged on Omnicef and a prednisone taper.  Since discharge patient is feeling some better but remains weak with no energy.  Gets worn out with minimum activity.  Cough and congestion have decreased but continues to get up some thick mucus intermittently. Patient was admitted at [facility].  Discharge records were requested. He remains on Symbicort and Spiriva. Appetite is good with no nausea vomiting or diarrhea. Would like flu shot today.   No Known Allergies  Immunization History  Administered Date(s) Administered   Fluad Quad(high Dose 65+) 04/27/2019, 04/26/2020, 05/15/2021   H1N1 07/09/2008   Influenza Split 05/06/2012, 06/21/2015   Influenza Whole 05/23/2010, 05/07/2011   Influenza, High Dose Seasonal PF 05/23/2016, 04/14/2018   Influenza,inj,Quad PF,6+ Mos 05/01/2013, 05/07/2014   Influenza-Unspecified 04/01/2017   PFIZER(Purple Top)SARS-COV-2  Vaccination 09/27/2019, 10/21/2019, 07/04/2020   Pneumococcal Conjugate-13 03/09/2014   Pneumococcal Polysaccharide-23 05/23/2010, 06/23/2018   Zoster, Live 12/05/2014    Past Medical History:  Diagnosis Date   Anemia    Anxiety    Arthritis    CKD (chronic kidney disease), stage II    COPD (chronic obstructive pulmonary disease) ([place])    Coronary artery disease    a. s/p CABG 1999.   Depression    Diabetes mellitus    Diverticulosis    GERD (gastroesophageal reflux disease)    Hiatal hernia    History of echocardiogram    Echocardiogram 10/21: EF 60-65, no RWMA, Gr 1 DD, normal RVSF, RVSP 15.2    History of nuclear stress test    Myoview 10/21: EF 62, normal perfusion; low risk    Hypercholesteremia    Hypertension    Sleep apnea    had sleep study and negative per pt   Thyroid disease    Tobacco abuse    Tubular adenoma of colon     Tobacco History: Social History   Tobacco Use  Smoking Status Former   Packs/day: 2.00   Years: 35.00   Pack years: 70.00   Types: Cigarettes   Quit date: 09/27/1997   Years since quitting: 23.6  Smokeless Tobacco Never   Counseling given: Not Answered   Outpatient Medications Prior to Visit  Medication Sig Dispense Refill   albuterol (PROAIR HFA) 108 (90 Base) MCG/ACT inhaler Inhale 2 puffs into the lungs every 4 (four) hours as needed for wheezing or shortness of breath. 1 each 3   albuterol (PROVENTIL) (2.5 MG/3ML) 0.083% nebulizer solution Take 3 mLs (2.5 mg total) by nebulization  every 6 (six) hours as needed for wheezing or shortness of breath. 360 mL 12   ALPRAZolam (XANAX) 0.5 MG tablet Take 0.5 mg by mouth at bedtime.      antiseptic oral rinse (BIOTENE) LIQD 15 mLs by Mouth Rinse route daily.     Ascorbic Acid (VITAMIN C) 1000 MG tablet Take 1,000 mg by mouth daily.     aspirin 81 MG tablet Take 81 mg by mouth at bedtime.      Cholecalciferol (VITAMIN D3) 50 MCG (2000 UT) TABS Take 1 tablet by mouth daily.     Coenzyme  Q10 (CO Q 10) 100 MG CAPS Take 100 mg by mouth at bedtime.      colesevelam (WELCHOL) 625 MG tablet TAKE 3 TABLETS BY MOUTH TWICE DAILY WITH MEALS 540 tablet 0   doxepin (SINEQUAN) 50 MG capsule Take 100 mg by mouth at bedtime.      doxycycline (VIBRA-TABS) 100 MG tablet Take 1 tablet (100 mg total) by mouth 2 (two) times daily. 14 tablet 0   esomeprazole (NEXIUM) 20 MG capsule Take 20 mg by mouth 2 (two) times daily.     furosemide (LASIX) 40 MG tablet Take 1 tablet (40 mg total) by mouth daily as needed (Ankle Swelling). 90 tablet 3   HUMALOG 100 UNIT/ML injection 80 Units.   6   hydrochlorothiazide (HYDRODIURIL) 25 MG tablet Take 1 tablet (25 mg total) by mouth daily. 90 tablet 3   levothyroxine (SYNTHROID) 88 MCG tablet Take 88 mcg by mouth every morning.     losartan (COZAAR) 100 MG tablet Take 1 tablet by mouth once daily 90 tablet 3   metFORMIN (GLUCOPHAGE) 500 MG tablet Take 1 tablet (500 mg total) by mouth 2 (two) times daily with a meal.     metoprolol succinate (TOPROL-XL) 50 MG 24 hr tablet TAKE 1 TABLET BY MOUTH ONCE DAILY WITH OR  IMMEDIATELY  FOLLOWING  A  MEAL 90 tablet 3   Multiple Vitamins-Minerals (CENTRUM SILVER 50+MEN) TABS Take 1 tablet by mouth daily.     nitroGLYCERIN (NITROSTAT) 0.4 MG SL tablet Place 1 tablet (0.4 mg total) under the tongue every 5 (five) minutes as needed for chest pain. 25 tablet 3   Omega-3 Fatty Acids (FISH OIL) 600 MG CAPS Take 1,400 mg by mouth at bedtime.      pravastatin (PRAVACHOL) 40 MG tablet Take 1 tablet by mouth once daily 90 tablet 3   SPIRIVA RESPIMAT 2.5 MCG/ACT AERS INHALE 2 SPRAY(S) BY MOUTH ONCE DAILY 4 g 7   SSD 1 % cream Apply topically as needed.     SYMBICORT 160-4.5 MCG/ACT inhaler INHALE 2 PUFFS BY MOUTH EVERY 12 HOURS, RINSE MOUTH AFTER EVERY USE. 11 g 5   Facility-Administered Medications Prior to Visit  Medication Dose Route Frequency Provider Last Rate Last Admin   0.9 %  sodium chloride infusion  500 mL Intravenous  Continuous Milus Banister, MD         Review of Systems:   Constitutional:   No  weight loss, night sweats,  Fevers, chills,  +fatigue, or  lassitude.  HEENT:   No headaches,  Difficulty swallowing,  Tooth/dental problems, or  Sore throat,                No sneezing, itching, ear ache, nasal congestion, post nasal drip,   CV:  No chest pain,  Orthopnea, PND, swelling in lower extremities, anasarca, dizziness, palpitations, syncope.   GI  No heartburn, indigestion,  abdominal pain, nausea, vomiting, diarrhea, change in bowel habits, loss of appetite, bloody stools.   Resp: .  No chest wall deformity  Skin: no rash or lesions.  GU: no dysuria, change in color of urine, no urgency or frequency.  No flank pain, no hematuria   MS:  No joint pain or swelling.  No decreased range of motion.  No back pain.    Physical Exam  BP 132/74 (BP Location: Left Arm, Patient Position: Sitting, Cuff Size: Normal)   Pulse 94   Temp 98.4 F (36.9 C) (Oral)   Ht 5\' 9"  (1.753 m)   Wt 188 lb (85.3 kg)   SpO2 93%   BMI 27.76 kg/m   GEN: A/Ox3; pleasant , NAD, elderly    HEENT:  [place]/AT,  NOSE-clear, THROAT-clear, no lesions, no postnasal drip or exudate noted.   NECK:  Supple w/ fair ROM; no JVD; normal carotid impulses w/o bruits; no thyromegaly or nodules palpated; no lymphadenopathy.    RESP  few trace rhonchi  no accessory muscle use, no dullness to percussion  CARD:  RRR, no m/r/g, tr  peripheral edema, pulses intact, no cyanosis or clubbing.  GI:   Soft & nt; nml bowel sounds; no organomegaly or masses detected.   Musco: Warm bil, no deformities or joint swelling noted.   Neuro: alert, no focal deficits noted.    Skin: Warm, no lesions or rashes    Lab Results:    BNP No results found for: BNP  ProBNP  Imaging: DG Chest 2 View  Result Date: 05/15/2021 CLINICAL DATA:  Cough, short of breath, history of pneumonia EXAM: CHEST - 2 VIEW COMPARISON:  05/07/2021 FINDINGS:  Frontal and lateral views of the chest demonstrates stable postsurgical changes from CABG. Cardiac silhouette is unremarkable. There is persistent but improving right basilar airspace disease consistent with resolving pneumonia. No effusion or pneumothorax. No acute bony abnormalities. IMPRESSION: 1. Persistent but improving right basilar pneumonia. Please note that it may take 6-8 weeks for complete radiographic resolution of pneumonia after appropriate medical management. Electronically Signed   By: Randa Ngo M.D.   On: 05/15/2021 15:00     PFT Results Latest Ref Rng & Units 08/24/2020 04/07/2014  FVC-Pre L 1.93 2.49  FVC-Predicted Pre % 46 57  FVC-Post L 2.30 2.84  FVC-Predicted Post % 55 65  Pre FEV1/FVC % % 56 51  Post FEV1/FCV % % 55 56  FEV1-Pre L 1.09 1.28  FEV1-Predicted Pre % 35 39  FEV1-Post L 1.26 1.60  DLCO uncorrected ml/min/mmHg 14.42 14.73  DLCO UNC% % 58 47  DLCO corrected ml/min/mmHg 14.42 -  DLCO COR %Predicted % 58 -  DLVA Predicted % 95 57  TLC L 6.52 6.10  TLC % Predicted % 95 89  RV % Predicted % 138 145    Lab Results  Component Value Date   NITRICOXIDE 6 05/09/2016        Assessment & Plan:   GOLD COPD C Recent exacerbation with community-acquired pneumonia.  Patient is improving.   Would continue on current maintenance regimen.  Add Mucinex DM twice daily Flu shot today  Plan  Patient Instructions  Continue on Symbicort and Spiriva  Mucinex DM Twice daily  As needed  cough/congestion  Albuterol inhaler or Neb As needed  Wheezing .  Activity as tolerated.  Flu shot today .  Follow up with [name]  In 4 weeks with chest xray and As needed   Please contact office for sooner  follow up if symptoms do not improve or worsen or seek emergency care       CAP (community acquired pneumonia) Recent right lower lobe community-acquired pneumonia.  Patient was hospitalized has completed a full course of antibiotics.  Clinically is improving.  Chest  x-ray today shows improvement in right lower lobe airspace disease.  We will repeat chest x-ray in 4 weeks for complete clearance  Plan  Patient Instructions  Continue on Symbicort and Spiriva  Mucinex DM Twice daily  As needed  cough/congestion  Albuterol inhaler or Neb As needed  Wheezing .  Activity as tolerated.  Flu shot today .  Follow up with [name]  In 4 weeks with chest xray and As needed   Please contact office for sooner follow up if symptoms do not improve or worsen or seek emergency care         Rexene Edison, NP 05/15/2021

## 2021-05-18 DIAGNOSIS — E119 Type 2 diabetes mellitus without complications: Secondary | ICD-10-CM | POA: Diagnosis not present

## 2021-05-18 DIAGNOSIS — L851 Acquired keratosis [keratoderma] palmaris et plantaris: Secondary | ICD-10-CM | POA: Diagnosis not present

## 2021-05-22 ENCOUNTER — Other Ambulatory Visit: Payer: Self-pay | Admitting: Cardiology

## 2021-05-22 DIAGNOSIS — J441 Chronic obstructive pulmonary disease with (acute) exacerbation: Secondary | ICD-10-CM | POA: Diagnosis not present

## 2021-05-24 ENCOUNTER — Other Ambulatory Visit: Payer: Self-pay | Admitting: Cardiology

## 2021-05-24 DIAGNOSIS — F411 Generalized anxiety disorder: Secondary | ICD-10-CM | POA: Diagnosis not present

## 2021-05-24 DIAGNOSIS — I10 Essential (primary) hypertension: Secondary | ICD-10-CM | POA: Diagnosis not present

## 2021-05-24 DIAGNOSIS — E7801 Familial hypercholesterolemia: Secondary | ICD-10-CM | POA: Diagnosis not present

## 2021-05-24 DIAGNOSIS — Z125 Encounter for screening for malignant neoplasm of prostate: Secondary | ICD-10-CM | POA: Diagnosis not present

## 2021-05-24 DIAGNOSIS — Z79899 Other long term (current) drug therapy: Secondary | ICD-10-CM | POA: Diagnosis not present

## 2021-06-04 ENCOUNTER — Other Ambulatory Visit: Payer: Self-pay | Admitting: Physician Assistant

## 2021-06-06 ENCOUNTER — Other Ambulatory Visit: Payer: Self-pay | Admitting: Physician Assistant

## 2021-06-08 ENCOUNTER — Ambulatory Visit (INDEPENDENT_AMBULATORY_CARE_PROVIDER_SITE_OTHER): Payer: Medicare Other | Admitting: Cardiology

## 2021-06-08 ENCOUNTER — Other Ambulatory Visit: Payer: Self-pay

## 2021-06-08 ENCOUNTER — Encounter: Payer: Self-pay | Admitting: Cardiology

## 2021-06-08 DIAGNOSIS — I2510 Atherosclerotic heart disease of native coronary artery without angina pectoris: Secondary | ICD-10-CM

## 2021-06-08 DIAGNOSIS — I25709 Atherosclerosis of coronary artery bypass graft(s), unspecified, with unspecified angina pectoris: Secondary | ICD-10-CM

## 2021-06-08 DIAGNOSIS — R600 Localized edema: Secondary | ICD-10-CM | POA: Diagnosis not present

## 2021-06-08 DIAGNOSIS — J189 Pneumonia, unspecified organism: Secondary | ICD-10-CM

## 2021-06-08 NOTE — Patient Instructions (Signed)

## 2021-06-08 NOTE — Assessment & Plan Note (Signed)
Recent hospitalization at [facility] reviewed.  He gave me some paperwork.  I do not have all of the details but it does sound like he had community-acquired pneumonia treated with IV antibiotics and developed sepsis.  He also had an increase troponin at the time.  Likely demand ischemia.  Currently he is still feeling weak and but he is slowly getting better.  Breathing is better.  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

## 2021-06-08 NOTE — Progress Notes (Signed)
Cardiology Office Note:    Date:  06/08/2021   ID:  Oscar Robinson, DOB 20-Sep-1945, MRN [id number]  PCP:  [name], MD  [facility] Cardiologist:  Candee Furbish, MD  [facility] Electrophysiologist:  None   Referring MD: [name], MD    History of Present Illness:    Oscar Robinson is a 75 y.o. male here to follow up for coronary artery disease, hypertension, and    here for the follow-up of coronary artery disease.  CABG 1999-LIMA to LAD  COPD followed by pulmonary clinic, [name]  Lower extremity edema left greater than right in general.  Has been short of breath.  Encouraged exercise.  States that he has been using the treadmill for 20 years.  Continue to exercise.  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  He also just bought another plot of land next to his.  He has approximately 270 acres in total in [place].  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  He did ask for potential name of psychiatry.  Gave him the name Dr. Toy Care   Today: He says that he has not been too good. He recently had pneumonia, and sepsis in the first of October. About 2-3 days after planting his crops he initially became ill.   Since then, his breathing has been improving but notes it is not yet where he wants it to be.   He reports having LE edema in his feet and has been taking lasix. If his legs are still swollen the next day, he will take another half tablet.   He reports that he has not been exercising due to fatigue from his recent illness.   He denies any palpitations, or chest pain. No lightheadedness, headaches, syncope, orthopnea, or PND.      Past Medical History:  Diagnosis Date   Anemia    Anxiety    Arthritis    CKD (chronic kidney disease), stage II    COPD (chronic obstructive pulmonary disease) ([place])    Coronary artery disease    a. s/p CABG 1999.   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   Procedure: DUPUYTREN CONTRACTURE RELEASE;  Surgeon: Cammie Sickle., MD;  Location: [facility];  Service: Orthopedics;  Laterality: Right;  palm, ring and small fingers dupuytrens contracture release   HAND SURGERY     lt palm,ring finger,   HAND SURGERY Left    pinky finger   HERNIA REPAIR  1996   lt/rt ing hernia   LEFT HEART CATH AND CORS/GRAFTS ANGIOGRAPHY N/A 02/26/2018   Procedure: LEFT HEART CATH AND CORS/GRAFTS ANGIOGRAPHY;  Surgeon: Jettie Booze, MD;  Location: [place];  Service: Cardiovascular;  Laterality:  N/A;   LEFT HEART CATHETERIZATION WITH CORONARY/GRAFT ANGIOGRAM N/A 06/15/2013   Procedure: LEFT HEART CATHETERIZATION WITH Beatrix Fetters;  Surgeon: Candee Furbish, MD;  Location: [facility] CATH LAB;  Service: Cardiovascular;  Laterality: N/A;   UPPER GASTROINTESTINAL ENDOSCOPY      Current Medications: Current Meds  Medication Sig   albuterol (PROAIR HFA) 108 (90 Base) MCG/ACT inhaler Inhale 2 puffs into the lungs every 4 (four) hours as needed for wheezing or shortness of breath.   albuterol (PROVENTIL) (2.5 MG/3ML) 0.083% nebulizer solution Take 3 mLs (2.5 mg total) by nebulization every 6 (six) hours as needed for wheezing or shortness of breath.   ALPRAZolam (XANAX) 0.5 MG tablet Take 0.5 mg by mouth at bedtime.    antiseptic oral rinse (BIOTENE) LIQD 15 mLs by Mouth Rinse route daily.   Ascorbic Acid  (VITAMIN C) 1000 MG tablet Take 1,000 mg by mouth daily.   aspirin 81 MG tablet Take 81 mg by mouth at bedtime.    Cholecalciferol (VITAMIN D3) 50 MCG (2000 UT) TABS Take 1 tablet by mouth daily.   Coenzyme Q10 (CO Q 10) 100 MG CAPS Take 100 mg by mouth at bedtime.    colesevelam (WELCHOL) 625 MG tablet TAKE 3 TABLETS BY MOUTH TWICE DAILY WITH MEALS   doxepin (SINEQUAN) 50 MG capsule Take 100 mg by mouth at bedtime.    doxycycline (VIBRA-TABS) 100 MG tablet Take 1 tablet (100 mg total) by mouth 2 (two) times daily.   esomeprazole (NEXIUM) 20 MG capsule Take 20 mg by mouth 2 (two) times daily.   furosemide (LASIX) 40 MG tablet Take 1 tablet (40 mg total) by mouth daily as needed (Ankle Swelling).   HUMALOG 100 UNIT/ML injection 80 Units.    hydrochlorothiazide (HYDRODIURIL) 25 MG tablet Take 1 tablet by mouth once daily   levothyroxine (SYNTHROID) 88 MCG tablet Take 100 mcg by mouth every morning.   losartan (COZAAR) 100 MG tablet Take 1 tablet by mouth once daily   metFORMIN (GLUCOPHAGE) 500 MG tablet Take 1 tablet (500 mg total) by mouth 2 (two) times daily with a meal.   metoprolol succinate (TOPROL-XL) 50 MG 24 hr tablet TAKE 1 TABLET BY MOUTH ONCE DAILY WITH OR  IMMEDIATELY  FOLLOWING  A  MEAL   Multiple Vitamins-Minerals (CENTRUM SILVER 50+MEN) TABS Take 1 tablet by mouth daily.   nitroGLYCERIN (NITROSTAT) 0.4 MG SL tablet Place 1 tablet (0.4 mg total) under the tongue every 5 (five) minutes as needed for chest pain.   Omega-3 Fatty Acids (FISH OIL) 600 MG CAPS Take 1,400 mg by mouth at bedtime.    pravastatin (PRAVACHOL) 40 MG tablet Take 1 tablet by mouth once daily   SPIRIVA RESPIMAT 2.5 MCG/ACT AERS INHALE 2 SPRAY(S) BY MOUTH ONCE DAILY   SSD 1 % cream Apply topically as needed.   SYMBICORT 160-4.5 MCG/ACT inhaler INHALE 2 PUFFS BY MOUTH EVERY 12 HOURS, RINSE MOUTH AFTER EVERY USE.   Current Facility-Administered Medications for the 06/08/21 encounter (Office Visit) with Jerline Pain, MD  Medication   0.9 %  sodium chloride infusion     Allergies:   Patient has no known allergies.   Social History   Socioeconomic History   Marital status: Divorced    Spouse name: Not on file   Number of children: 1   Years of education: Not on file   Highest education level: Not on file  Occupational History   Occupation: Secondary school teacher and [employer]  Tobacco  Use   Smoking status: Former    Packs/day: 2.00    Years: 35.00    Pack years: 70.00    Types: Cigarettes    Quit date: 09/27/1997    Years since quitting: 23.7   Smokeless tobacco: Never  Substance and Sexual Activity   Alcohol use: Yes    Comment: occ   Drug use: No   Sexual activity: Not on file  Other Topics Concern   Not on file  Social History Narrative   Not on file   Social Determinants of Health   Financial Resource Strain: Not on file  Food Insecurity: Not on file  Transportation Needs: Not on file  Physical Activity: Not on file  Stress: Not on file  Social Connections: Not on file     Family History: The patient's family history includes Drug abuse in his brother. There is no history of Heart disease, Heart failure, Diabetes, Hypertension, Colon cancer, Esophageal cancer, Prostate cancer, Pancreatic cancer, Kidney disease, Liver disease, Stomach cancer, or Rectal cancer.  ROS:   Please see the history of present illness.    (+) LE edema (+) Shortness of Breath  All other systems reviewed and are negative.  EKGs/Labs/Other Studies Reviewed:     The following studies were reviewed today:  EKG: EKG is personally reviewed and interpreted.   06/08/2021: EKG was not ordered today.  Cardiac catheterization 02/26/2018 Ost 1st Diag lesion is 75% stenosed. LIMA to diagonal is patent. Appears unchanged from prior cath. Prox LAD lesion is 25% stenosed. The left ventricular systolic function is normal. LV end diastolic pressure is normal. The left ventricular ejection fraction  is 55-65% by visual estimate. There is no aortic valve stenosis.   No change from prior cath.   Echocardiogram 05/24/20 EF 60-65, no RWMA, Gr 1 DD, normal RVSF, normal PASP   Myoview 05/24/20 Normal perfusion. LVEF 62% with normal wall motion. This is a low risk study. Compared to a prior study in 2014, ischemia is no longer present.   Cardiac catheterization 02/26/18 LAD prox 25; D1 ost 75 L-LAD patent  EF 55-65   Echocardiogram 01/28/18 Mild LVH, EF 55-65, no RWMA, trivial MR, normal RVSF, PASP 23  ECHO 2021:   1. Left ventricular ejection fraction, by estimation, is 60 to 65%. The  left ventricle has normal function. The left ventricle has no regional  wall motion abnormalities. Left ventricular diastolic parameters are  consistent with Grade I diastolic  dysfunction (impaired relaxation).   2. Right ventricular systolic function is normal. The right ventricular  size is normal. There is normal pulmonary artery systolic pressure.   3. The mitral valve is normal in structure. No evidence of mitral valve  regurgitation. No evidence of mitral stenosis.   4. The aortic valve is tricuspid. Aortic valve regurgitation is not  visualized. No aortic stenosis is present.   5. The inferior vena cava is normal in size with greater than 50%  respiratory variability, suggesting right atrial pressure of 3 mmHg.   NUC 2021:  Nuclear stress EF: 62%. No T wave inversion was noted during stress. There was no ST segment deviation noted during stress. This is a low risk study.   Normal perfusion. LVEF 62% with normal wall motion. This is a low risk study. Compared to a prior study in 2014, ischemia is no longer present.    CPET 12/2/216 Conclusion: Exercise testing with gas exchange demonstrates a  significant functional impairment of atleast moderate severity  when compared to  matched sedentary  norms. Pre-exercise spirometry demonstrates evidence of  severe obstruction. At peak  exercise, the patient is severely  ventilatory limited due to obstructive lung physiology.    CT Chest 09/01/2020:  IMPRESSION: 1. No acute process in the chest. 2. Tiny hiatal hernia. Distal esophageal wall thickening, likely representing esophagitis. 3. Aortic Atherosclerosis (ICD10-I70.0) and Emphysema (ICD10-J43.9).  FINDINGS: Cardiovascular: Aortic atherosclerosis. Tortuous thoracic aorta. Normal heart size, without pericardial effusion. Median sternotomy for prior CABG.   Mediastinum/Nodes: No mediastinal or definite hilar adenopathy, given limitations of unenhanced CT. Tiny hiatal hernia. Distal esophageal wall thickening is mild to moderate on 126/2.   Lungs/Pleura: No pleural fluid. Moderate centrilobular emphysema. Mild biapical pleuroparenchymal scarring.   Upper Abdomen: Normal imaged portions of the liver, spleen, adrenal glands. Bilateral perinephric interstitial thickening is symmetric and nonspecific. Fatty replacement throughout the pancreas. Abdominal aortic atherosclerosis.   Musculoskeletal: No acute osseous abnormality.  Recent Labs: 03/14/2021: ALT 20; BUN 17; Creatinine, Ser 1.30; Hemoglobin 12.9; Platelets 227; Potassium 4.3; Sodium 140  Recent Lipid Panel    Component Value Date/Time   CHOL 143 03/14/2021 1226   TRIG 107 03/14/2021 1226   HDL 57 03/14/2021 1226   CHOLHDL 2.5 03/14/2021 1226   CHOLHDL 2.2 06/05/2016 1055   VLDL 18 06/05/2016 1055   LDLCALC 67 03/14/2021 1226     Risk Assessment/Calculations:      Physical Exam:    VS:  BP 120/60 (BP Location: Right Arm, Patient Position: Sitting, Cuff Size: Normal)   Pulse 84   Ht 5\' 9"  (1.753 m)   Wt 191 lb (86.6 kg)   SpO2 95%   BMI 28.21 kg/m     Wt Readings from Last 3 Encounters:  06/08/21 191 lb (86.6 kg)  05/15/21 188 lb (85.3 kg)  03/06/21 186 lb 6.4 oz (84.6 kg)     GEN:  Well nourished, well developed in no acute distress HEENT: Normal NECK: No JVD; No carotid  bruits LYMPHATICS: No lymphadenopathy CARDIAC: RRR, no murmurs, rubs, gallops RESPIRATORY:  Wheezing bilaterally with prolonged expiratory phase, without rales, or rhonchi  ABDOMEN: Soft, non-tender, non-distended MUSCULOSKELETAL:  2+ bilateral LE edema; No deformity  SKIN: Warm and dry NEUROLOGIC:  Alert and oriented x 3 PSYCHIATRIC:  Normal affect   ASSESSMENT:    1. Coronary artery disease involving coronary bypass graft of native heart with angina pectoris ([place])   2. Community acquired pneumonia, unspecified laterality   3. Lower extremity edema     PLAN:    In order of problems listed above:  Coronary artery disease involving coronary bypass graft of native heart with angina pectoris [facility]) Prior cardiac catheterization in July 2019 without any significant changes.Marland Kitchen  Has 75% ostial first diagonal stenosis.  His LIMA is patent to his diagonal.  Proximal LAD is 25% stenosed.  His angina is being treated well with prior bypass and current medication strategy which includes metoprolol succinate 50 mg a day.  No changes made.  CAP (community acquired pneumonia) Recent hospitalization at [facility] reviewed.  He gave me some paperwork.  I do not have all of the details but it does sound like he had community-acquired pneumonia treated with IV antibiotics and developed sepsis.  He also had an increase troponin at the time.  Likely demand ischemia.  Currently he is still feeling weak and but he is slowly getting better.  Breathing is better.  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  Lower extremity edema Uses occasional furosemide.  This seems to help.  We had stopped amlodipine at a prior visit.   54-month follow-up        Medication Adjustments/Labs and Tests Ordered: Current medicines are reviewed at length with the patient today.  Concerns regarding medicines are outlined above.  No orders of the  defined types were placed in this encounter.  No orders of the defined types were placed in this encounter.   Patient Instructions  Medication Instructions:  The current medical regimen is effective;  continue present plan and medications.  *If you need a refill on your cardiac medications before your next appointment, please call your pharmacy*  Follow-Up: At [facility], you and your health needs are our priority.  As part of our continuing mission to provide you with exceptional heart care, we have created designated Provider Care Teams.  These Care Teams include your primary Cardiologist (physician) and Advanced Practice Providers (APPs -  Physician Assistants and Nurse Practitioners) who all work together to provide you with the care you need, when you need it.  We recommend signing up for the patient portal called "MyChart".  Sign up information is provided on this After Visit Summary.  MyChart is used to connect with patients for Virtual Visits (Telemedicine).  Patients are able to view lab/test results, encounter notes, upcoming appointments, etc.  Non-urgent messages can be sent to your provider as well.   To learn more about what you can do with MyChart, go to [email or web address].    Your next appointment:   6 month(s)  The format for your next appointment:   In Person  Provider:   Candee Furbish, MD   Thank you for choosing [facility]!!      I,Mathew Stumpf,acting as a scribe for Candee Furbish, MD.,have documented all relevant documentation on the behalf of Candee Furbish, MD,as directed by  Candee Furbish, MD while in the presence of Candee Furbish, MD.   I, Candee Furbish, MD, have reviewed all documentation for this visit. The documentation on 06/08/21 for the exam, diagnosis, procedures, and orders are all accurate and complete.   Signed, Candee Furbish, MD  06/08/2021 3:17 PM    [facility]

## 2021-06-08 NOTE — Assessment & Plan Note (Signed)
Uses occasional furosemide.  This seems to help.  We had stopped amlodipine at a prior visit.

## 2021-06-08 NOTE — Assessment & Plan Note (Addendum)
Prior cardiac catheterization in July 2019 without any significant changes.Marland Kitchen  Has 75% ostial first diagonal stenosis.  His LIMA is patent to his diagonal.  Proximal LAD is 25% stenosed.  His angina is being treated well with prior bypass and current medication strategy which includes metoprolol succinate 50 mg a day.  No changes made.

## 2021-06-13 ENCOUNTER — Ambulatory Visit (INDEPENDENT_AMBULATORY_CARE_PROVIDER_SITE_OTHER): Payer: Medicare Other | Admitting: Emergency Medicine

## 2021-06-13 ENCOUNTER — Ambulatory Visit (INDEPENDENT_AMBULATORY_CARE_PROVIDER_SITE_OTHER): Payer: Medicare Other

## 2021-06-13 ENCOUNTER — Other Ambulatory Visit: Payer: Self-pay

## 2021-06-13 ENCOUNTER — Encounter: Payer: Self-pay | Admitting: Emergency Medicine

## 2021-06-13 ENCOUNTER — Ambulatory Visit: Payer: Medicare Other

## 2021-06-13 DIAGNOSIS — J441 Chronic obstructive pulmonary disease with (acute) exacerbation: Secondary | ICD-10-CM | POA: Diagnosis not present

## 2021-06-13 DIAGNOSIS — R059 Cough, unspecified: Secondary | ICD-10-CM | POA: Diagnosis not present

## 2021-06-13 DIAGNOSIS — J189 Pneumonia, unspecified organism: Secondary | ICD-10-CM | POA: Diagnosis not present

## 2021-06-13 DIAGNOSIS — I2510 Atherosclerotic heart disease of native coronary artery without angina pectoris: Secondary | ICD-10-CM

## 2021-06-13 NOTE — Assessment & Plan Note (Signed)
Chest x-ray reviewed.  Improved right lower lobe aeration.

## 2021-06-13 NOTE — Assessment & Plan Note (Signed)
Improving but not quite back to baseline following a serious hospitalization for pneumonia and exacerbation of COPD.  He is not requiring oxygen.  He is still on Spiriva, Symbicort.  We have tried alternatives but he has not done as well on these.  Chest x-ray today is improved.  We reviewed your chest x-ray today.  There has been improvement compared with your priors.  Your right lower lobe pneumonia has cleared Please continue your Spiriva and Symbicort as you have been taking them. Please continue to keep albuterol available to use 2 puffs up to every 4 hours if needed for shortness of breath, chest tightness, wheezing. Flu shot is up-to-date COVID-19 shot is up-to-date.  You would benefit from getting the COVID-19 booster this fall Follow [name] in 3 months or sooner if you have any new problems.

## 2021-06-13 NOTE — Progress Notes (Signed)
HPI:  ROV 06/13/21 -- 75 yo man, hx severe COPD. Last seen by me in 12/2019.  He is hospitalized was hospitalized at [facility] in September for a community-acquired pneumonia and an acute exacerbation of his COPD. He is off prednisone now - was treated treated again in October w pred for persistent sx of wheeze, dyspnea. He notes that he has been having sweats in the am.  He is having some dyspnea in the am when he gets up, with exertion / chores. He is not back to an exercise routine. He was not sent home with oxygen. Very little cough.  PMH: CAD/CABG, hypertension, allergic rhinitis, GERD. Uses albuterol about , which is more than he used to need it Today he reports.  He is currently managed on Spiriva and Symbicort. Has a sore throat from Trelegy, stopped.  Is on Nexium twice daily. Flu shot up to date. Needs the new booster.   Chest x-ray performed today and reviewed by me shows hyperinflation, large-scale resolution of some right basilar infiltrate.  No effusion.   EXAM:  Vitals:   06/13/21 1350  BP: 140/78  Pulse: 84  Temp: 97.8 F (36.6 C)  TempSrc: Oral  SpO2: 97%  Weight: 193 lb (87.5 kg)  Height: 5\' 9"  (1.753 m)    Gen: Pleasant, well-nourished, in no distress,  normal affect  ENT: No lesions,  mouth clear,  oropharynx clear, no postnasal drip  Neck: No JVD, no stridor  Lungs: No use of accessory muscles, soft bilateral expiratory wheezes  Cardiovascular: RRR, heart sounds normal, no murmur or gallops, no peripheral edema  Musculoskeletal: No deformities, no cyanosis or clubbing  Neuro: alert, non focal  Skin: Warm, no lesions or rashes   GOLD COPD C Improving but not quite back to baseline following a serious hospitalization for pneumonia and exacerbation of COPD.  He is not requiring oxygen.  He is still on Spiriva, Symbicort.  We have tried alternatives but he has not done as well on these.  Chest x-ray today is improved.  We reviewed your chest x-ray today.   There has been improvement compared with your priors.  Your right lower lobe pneumonia has cleared Please continue your Spiriva and Symbicort as you have been taking them. Please continue to keep albuterol available to use 2 puffs up to every 4 hours if needed for shortness of breath, chest tightness, wheezing. Flu shot is up-to-date COVID-19 shot is up-to-date.  You would benefit from getting the COVID-19 booster this fall Follow [name] in 3 months or sooner if you have any new problems.  CAP (community acquired pneumonia) Chest x-ray reviewed.  Improved right lower lobe aeration.  Time spent 41 minutes  Baltazar Apo, MD, PhD 06/13/2021, 2:11 PM [facility] [phone] or if no answer [phone]

## 2021-06-13 NOTE — Patient Instructions (Signed)
We reviewed your chest x-ray today.  There has been improvement compared with your priors.  Your right lower lobe pneumonia has cleared Please continue your Spiriva and Symbicort as you have been taking them. Please continue to keep albuterol available to use 2 puffs up to every 4 hours if needed for shortness of breath, chest tightness, wheezing. Flu shot is up-to-date COVID-19 shot is up-to-date.  You would benefit from getting the COVID-19 booster this fall Follow [name] in 3 months or sooner if you have any new problems.

## 2021-06-14 ENCOUNTER — Telehealth: Payer: Self-pay | Admitting: Emergency Medicine

## 2021-06-14 DIAGNOSIS — Z23 Encounter for immunization: Secondary | ICD-10-CM | POA: Diagnosis not present

## 2021-06-15 NOTE — Telephone Encounter (Signed)
Lm for patient.  

## 2021-06-20 DIAGNOSIS — E02 Subclinical iodine-deficiency hypothyroidism: Secondary | ICD-10-CM | POA: Diagnosis not present

## 2021-06-20 DIAGNOSIS — E1165 Type 2 diabetes mellitus with hyperglycemia: Secondary | ICD-10-CM | POA: Diagnosis not present

## 2021-06-20 DIAGNOSIS — I10 Essential (primary) hypertension: Secondary | ICD-10-CM | POA: Diagnosis not present

## 2021-06-20 DIAGNOSIS — E049 Nontoxic goiter, unspecified: Secondary | ICD-10-CM | POA: Diagnosis not present

## 2021-06-20 DIAGNOSIS — E7800 Pure hypercholesterolemia, unspecified: Secondary | ICD-10-CM | POA: Diagnosis not present

## 2021-06-20 DIAGNOSIS — Z9641 Presence of insulin pump (external) (internal): Secondary | ICD-10-CM | POA: Diagnosis not present

## 2021-06-23 DIAGNOSIS — N401 Enlarged prostate with lower urinary tract symptoms: Secondary | ICD-10-CM | POA: Diagnosis not present

## 2021-06-23 DIAGNOSIS — R972 Elevated prostate specific antigen [PSA]: Secondary | ICD-10-CM | POA: Diagnosis not present

## 2021-06-26 ENCOUNTER — Other Ambulatory Visit: Payer: Self-pay | Admitting: Cardiology

## 2021-06-26 ENCOUNTER — Other Ambulatory Visit: Payer: Self-pay | Admitting: Endocrinology

## 2021-06-26 ENCOUNTER — Other Ambulatory Visit: Payer: Self-pay | Admitting: Orthopedic Surgery

## 2021-06-26 DIAGNOSIS — E049 Nontoxic goiter, unspecified: Secondary | ICD-10-CM

## 2021-06-27 NOTE — Telephone Encounter (Signed)
Spoke with pt and notified him of [name] response. Nothing further needed at that time.

## 2021-06-27 NOTE — Telephone Encounter (Signed)
He should not need it until 2024

## 2021-06-27 NOTE — Telephone Encounter (Addendum)
Spoke with the pt  He is asking if Dr [name] thinks that he should get another pneumonia vaccine  He had pneumo 23 05/23/2010, 06/23/18 and also had prevnar 13 03/09/14  Please advise, thanks!

## 2021-06-27 NOTE — Telephone Encounter (Signed)
Attempted to call pt but unable to reach. Left message for him to return call. Due to multiple attempts trying to reach pt and unable to do so, per protocol encounter will be closed. 

## 2021-07-03 ENCOUNTER — Ambulatory Visit
Admission: RE | Admit: 2021-07-03 | Discharge: 2021-07-03 | Disposition: A | Discharge status: Home / Self Care | Payer: Medicare Other | Source: Ambulatory Visit | Attending: Endocrinology | Admitting: Endocrinology

## 2021-07-03 DIAGNOSIS — E049 Nontoxic goiter, unspecified: Secondary | ICD-10-CM

## 2021-07-03 DIAGNOSIS — E041 Nontoxic single thyroid nodule: Secondary | ICD-10-CM | POA: Diagnosis not present

## 2021-07-13 DIAGNOSIS — R0789 Other chest pain: Secondary | ICD-10-CM | POA: Diagnosis not present

## 2021-07-13 DIAGNOSIS — S29011A Strain of muscle and tendon of front wall of thorax, initial encounter: Secondary | ICD-10-CM | POA: Diagnosis not present

## 2021-07-13 DIAGNOSIS — E109 Type 1 diabetes mellitus without complications: Secondary | ICD-10-CM | POA: Diagnosis not present

## 2021-08-09 DIAGNOSIS — R972 Elevated prostate specific antigen [PSA]: Secondary | ICD-10-CM | POA: Diagnosis not present

## 2021-08-09 DIAGNOSIS — N401 Enlarged prostate with lower urinary tract symptoms: Secondary | ICD-10-CM | POA: Diagnosis not present

## 2021-08-15 ENCOUNTER — Telehealth: Payer: Self-pay | Admitting: *Deleted

## 2021-08-15 NOTE — Telephone Encounter (Signed)
° °  Pre-operative Risk Assessment    Patient Name: Oscar Robinson  DOB: 11-06-1945 MRN: [id number]      Request for Surgical Clearance    Procedure:   ULTRASOUND GUIDED PROSTATE Bx  Date of Surgery:  Clearance 08/31/21                                 Surgeon:  MD NOT LISTED Surgeon's Group or Practice Name:  [facility] Phone number:  [phone] Fax number:  [phone]   Type of Clearance Requested:   - Medical  - Pharmacy:  Hold Aspirin x 7 DAYS PRIOR AND 7 DAYS POST PROCEDURE   Type of Anesthesia:  Not Indicated   Additional requests/questions:    Jiles Prows   08/15/2021, 2:08 PM

## 2021-08-15 NOTE — Telephone Encounter (Signed)
Oscar Robinson "Ricky" 76 year old male is requesting preoperative cardiac evaluation for ultrasound-guided prostate biopsy.  He was last seen in the clinic on 06/08/2021.  During that time he reported a hospitalization at [facility] that included IV antibiotics and sepsis.  His breathing continued to improve.  No changes were made from a cardiac standpoint.  He continues to use occasional furosemide for lower extremity swelling.  His PMH includes anemia, anxiety, arthritis, CKD stage II, COPD, coronary artery disease status post CABG 99, GERD, hiatal hernia, hypercholesterolemia, hypertension, thyroid disease, and tobacco abuse.  May his aspirin be held 7 days prior to his procedure and 7 days post procedure?  Thank you for your help.  Please direct your response to CV DIV preop pool.  Jossie Ng. Schyler Butikofer NP-C    08/15/2021, 2:26 PM [facility] [address] Office [phone] Fax [phone]

## 2021-08-16 NOTE — Telephone Encounter (Signed)
I am okay with him holding his aspirin 7 days prior and 7 days postprocedure secondary to high bleeding risk with prostate biopsy. Candee Furbish, MD

## 2021-08-17 NOTE — Telephone Encounter (Signed)
Patient states he is returning a call back, from preop.

## 2021-08-17 NOTE — Telephone Encounter (Signed)
° °  Primary Cardiologist: Candee Furbish, MD  Chart reviewed as part of pre-operative protocol coverage. Given past medical history and time since last visit, based on ACC/AHA guidelines, Oscar Robinson would be at acceptable risk for the planned procedure without further cardiovascular testing.   I spoke with him on 08/17/21 and he reports that he is back in the gym and is walking 30 minutes on the treadmill without chest pain. He reports SOB has been persistent since diagnosis of pneumonia and sepsis in October but he is feeling progressively better. He is able to complete > 4 METS activity without difficulty.  His RCRI for MACE is Class III 6.6%.   I am okay with him holding his aspirin 7 days prior and 7 days postprocedure secondary to high bleeding risk with prostate biopsy. Candee Furbish, MD    Patient was advised that if he develops new symptoms prior to surgery to contact our office to arrange a follow-up appointment.  He verbalized understanding.  I will route this recommendation to the requesting party via Epic fax function and remove from pre-op pool.  Please call with questions.  Emmaline Life, NP-C    08/17/2021, 2:34 PM [facility] [address], [address] Office [phone] Fax [phone]

## 2021-08-24 DIAGNOSIS — J441 Chronic obstructive pulmonary disease with (acute) exacerbation: Secondary | ICD-10-CM | POA: Diagnosis not present

## 2021-08-29 ENCOUNTER — Ambulatory Visit (INDEPENDENT_AMBULATORY_CARE_PROVIDER_SITE_OTHER): Payer: Medicare Other

## 2021-08-29 ENCOUNTER — Ambulatory Visit (INDEPENDENT_AMBULATORY_CARE_PROVIDER_SITE_OTHER): Payer: Medicare Other | Admitting: Primary Care

## 2021-08-29 ENCOUNTER — Encounter: Payer: Self-pay | Admitting: Primary Care

## 2021-08-29 ENCOUNTER — Other Ambulatory Visit: Payer: Self-pay

## 2021-08-29 VITALS — BP 144/74 | HR 85 | Temp 98.3°F | Ht 70.0 in | Wt 194.0 lb

## 2021-08-29 DIAGNOSIS — J441 Chronic obstructive pulmonary disease with (acute) exacerbation: Secondary | ICD-10-CM | POA: Diagnosis not present

## 2021-08-29 MED ORDER — PREDNISONE 10 MG PO TABS: ORAL_TABLET | ORAL | 0 refills | Status: DC

## 2021-08-29 MED ORDER — AMOXICILLIN-POT CLAVULANATE 875-125 MG PO TABS: 1.0000 | ORAL_TABLET | Freq: Two times a day (BID) | ORAL | 0 refills | Status: DC

## 2021-08-29 NOTE — Progress Notes (Signed)
@Patient  ID: Oscar Robinson, male    DOB: 06-03-1946, 76 y.o.   MRN: [id number]  Chief Complaint  Patient presents with   Follow-up    Patient says the congestion and wheezing has been going on for a week.    Referring provider: Enid Robinson., MD  HPI: 76 year old male, former smoker. PMH significant for COPD GOLD C, CAD, GERD. Patient of [name].   Previous [facility] pulmonary encounter: ROV 06/13/21 -- 76 yo man, hx severe COPD. Last seen by me in 12/2019.  He is hospitalized was hospitalized at [facility] in September for a community-acquired pneumonia and an acute exacerbation of his COPD. He is off prednisone now - was treated treated again in October w pred for persistent sx of wheeze, dyspnea. He notes that he has been having sweats in the am.  He is having some dyspnea in the am when he gets up, with exertion / chores. He is not back to an exercise routine. He was not sent home with oxygen. Very little cough.  PMH: CAD/CABG, hypertension, allergic rhinitis, GERD. Uses albuterol about , which is more than he used to need it Today he reports.  He is currently managed on Spiriva and Symbicort. Has a sore throat from Trelegy, stopped.  Is on Nexium twice daily. Flu shot up to date. Needs the new booster.   Chest x-ray performed today and reviewed by me shows hyperinflation, large-scale resolution of some right basilar infiltrate.  No effusion.   08/29/2021- Interim hx Patient presents today for acute OV/cough. Hx COPD GOLD C and right lower lobe pneumonia/CAP. Patient reports acute symptoms of head and chest congestion x 1 week. Cough is productive with yellow mucus. He has associated shortness of breath with exertion and wheezing. He saw PCP last week and was given prescription for abx but is unsure name and prednisone taper.  We verified with pharmacy that he was prescribed azithromycin on 08/24/21. He has seen no improvement after taking medication. He was taking goodie powder d.t not  feeling well. He is using Symbicort and Spiriva as prescribed. He has been using nebulizer 2-3 times a day with short term improvement. No recent chest imaging.   No Known Allergies  Immunization History  Administered Date(s) Administered   Fluad Quad(high Dose 65+) 04/27/2019, 04/26/2020, 05/15/2021   H1N1 07/09/2008   Influenza Split 05/06/2012, 06/21/2015   Influenza Whole 05/23/2010, 05/07/2011   Influenza, High Dose Seasonal PF 05/23/2016, 04/14/2018   Influenza,inj,Quad PF,6+ Mos 05/01/2013, 05/07/2014   Influenza-Unspecified 04/01/2017   PFIZER(Purple Top)SARS-COV-2 Vaccination 09/27/2019, 10/21/2019, 07/04/2020   Pneumococcal Conjugate-13 03/09/2014   Pneumococcal Polysaccharide-23 05/23/2010, 06/23/2018   Zoster, Live 12/05/2014    Past Medical History:  Diagnosis Date   Anemia    Anxiety    Arthritis    CKD (chronic kidney disease), stage II    COPD (chronic obstructive pulmonary disease) ([place])    Coronary artery disease    a. s/p CABG 1999.   Depression    Diabetes mellitus    Diverticulosis    GERD (gastroesophageal reflux disease)    Hiatal hernia    History of echocardiogram    Echocardiogram 10/21: EF 60-65, no RWMA, Gr 1 DD, normal RVSF, RVSP 15.2    History of nuclear stress test    Myoview 10/21: EF 62, normal perfusion; low risk    Hypercholesteremia    Hypertension    Sleep apnea    had sleep study and negative per pt  Thyroid disease    Tobacco abuse    Tubular adenoma of colon     Tobacco History: Social History   Tobacco Use  Smoking Status Former   Packs/day: 2.00   Years: 35.00   Pack years: 70.00   Types: Cigarettes   Quit date: 09/27/1997   Years since quitting: 23.9  Smokeless Tobacco Never   Counseling given: Not Answered   Outpatient Medications Prior to Visit  Medication Sig Dispense Refill   albuterol (PROAIR HFA) 108 (90 Base) MCG/ACT inhaler Inhale 2 puffs into the lungs every 4 (four) hours as needed for wheezing or  shortness of breath. 1 each 3   albuterol (PROVENTIL) (2.5 MG/3ML) 0.083% nebulizer solution Take 3 mLs (2.5 mg total) by nebulization every 6 (six) hours as needed for wheezing or shortness of breath. 360 mL 12   ALPRAZolam (XANAX) 0.5 MG tablet Take 0.5 mg by mouth at bedtime.      antiseptic oral rinse (BIOTENE) LIQD 15 mLs by Mouth Rinse route daily.     Ascorbic Acid (VITAMIN C) 1000 MG tablet Take 1,000 mg by mouth daily.     aspirin 81 MG tablet Take 81 mg by mouth at bedtime.      Cholecalciferol (VITAMIN D3) 50 MCG (2000 UT) TABS Take 1 tablet by mouth daily.     Coenzyme Q10 (CO Q 10) 100 MG CAPS Take 100 mg by mouth at bedtime.      colesevelam (WELCHOL) 625 MG tablet TAKE 3 TABLETS BY MOUTH TWICE DAILY WITH MEALS 540 tablet 2   doxepin (SINEQUAN) 50 MG capsule Take 100 mg by mouth at bedtime.      esomeprazole (NEXIUM) 20 MG capsule Take 20 mg by mouth 2 (two) times daily.     HUMALOG 100 UNIT/ML injection 80 Units.   6   hydrochlorothiazide (HYDRODIURIL) 25 MG tablet Take 1 tablet by mouth once daily 90 tablet 3   levothyroxine (SYNTHROID) 88 MCG tablet Take 100 mcg by mouth every morning.     losartan (COZAAR) 100 MG tablet Take 1 tablet by mouth once daily 90 tablet 3   metFORMIN (GLUCOPHAGE) 500 MG tablet Take 1 tablet (500 mg total) by mouth 2 (two) times daily with a meal.     metoprolol succinate (TOPROL-XL) 50 MG 24 hr tablet TAKE 1 TABLET BY MOUTH ONCE DAILY WITH  OR  IMMEDIATELY  FOLLOWING  A  MEAL 90 tablet 3   Multiple Vitamins-Minerals (CENTRUM SILVER 50+MEN) TABS Take 1 tablet by mouth daily.     nitroGLYCERIN (NITROSTAT) 0.4 MG SL tablet Place 1 tablet (0.4 mg total) under the tongue every 5 (five) minutes as needed for chest pain. 25 tablet 3   Omega-3 Fatty Acids (FISH OIL) 600 MG CAPS Take 1,400 mg by mouth at bedtime.      pravastatin (PRAVACHOL) 40 MG tablet Take 1 tablet by mouth once daily 90 tablet 3   SPIRIVA RESPIMAT 2.5 MCG/ACT AERS INHALE 2 SPRAY(S) BY  MOUTH ONCE DAILY 4 g 7   SSD 1 % cream Apply topically as needed.     SYMBICORT 160-4.5 MCG/ACT inhaler INHALE 2 PUFFS BY MOUTH EVERY 12 HOURS, RINSE MOUTH AFTER EVERY USE. 11 g 5   furosemide (LASIX) 40 MG tablet Take 1 tablet (40 mg total) by mouth daily as needed (Ankle Swelling). 90 tablet 3   doxycycline (VIBRA-TABS) 100 MG tablet Take 1 tablet (100 mg total) by mouth 2 (two) times daily. (Patient not taking: Reported on 06/13/2021)  14 tablet 0   Facility-Administered Medications Prior to Visit  Medication Dose Route Frequency Provider Last Rate Last Admin   0.9 %  sodium chloride infusion  500 mL Intravenous Continuous Milus Banister, MD        Review of Systems  Review of Systems  Constitutional: Negative.   HENT:  Positive for congestion.   Respiratory:  Positive for cough, shortness of breath and wheezing. Negative for chest tightness.   Psychiatric/Behavioral: Negative.      Physical Exam  BP (!) 144/74 (BP Location: Left Arm, Patient Position: Sitting, Cuff Size: Normal)    Pulse 85    Temp 98.3 F (36.8 C) (Oral)    Ht 5\' 10"  (1.778 m)    Wt 194 lb (88 kg)    SpO2 96%    BMI 27.84 kg/m  Physical Exam Constitutional:      Appearance: Normal appearance. He is not toxic-appearing.  HENT:     Head: Normocephalic and atraumatic.     Mouth/Throat:     Mouth: Mucous membranes are moist.     Pharynx: Oropharynx is clear.  Cardiovascular:     Rate and Rhythm: Normal rate and regular rhythm.  Pulmonary:     Effort: Pulmonary effort is normal.     Breath sounds: Wheezing and rhonchi present. No rales.  Neurological:     General: No focal deficit present.     Mental Status: He is alert and oriented to person, place, and time. Mental status is at baseline.  Psychiatric:        Mood and Affect: Mood normal.        Behavior: Behavior normal.        Thought Content: Thought content normal.        Judgment: Judgment normal.     Lab Results:  CBC    Component Value  Date/Time   WBC 7.2 03/14/2021 1226   WBC 8.6 07/31/2018 1438   RBC 4.26 03/14/2021 1226   RBC 4.22 07/31/2018 1438   HGB 12.9 (L) 03/14/2021 1226   HCT 39.2 03/14/2021 1226   PLT 227 03/14/2021 1226   MCV 92 03/14/2021 1226   MCH 30.3 03/14/2021 1226   MCH 21.3 (L) 06/05/2016 1055   MCHC 32.9 03/14/2021 1226   MCHC 33.1 07/31/2018 1438   RDW 14.1 03/14/2021 1226   LYMPHSABS 2.0 07/31/2018 1438   MONOABS 1.1 (H) 07/31/2018 1438   EOSABS 0.2 07/31/2018 1438   BASOSABS 0.1 07/31/2018 1438    BMET    Component Value Date/Time   NA 140 03/14/2021 1226   K 4.3 03/14/2021 1226   CL 103 03/14/2021 1226   CO2 24 03/14/2021 1226   GLUCOSE 156 (H) 03/14/2021 1226   GLUCOSE 140 (H) 06/05/2016 1055   BUN 17 03/14/2021 1226   CREATININE 1.30 (H) 03/14/2021 1226   CREATININE 1.26 (H) 06/05/2016 1055   CALCIUM 8.9 03/14/2021 1226   GFRNONAA 60 06/03/2020 1216   GFRAA 69 06/03/2020 1216    BNP No results found for: BNP  ProBNP    Component Value Date/Time   PROBNP 73 01/29/2018 1436    Imaging: DG Chest 2 View  Result Date: 08/29/2021 CLINICAL DATA:  COPD exacerbation question pneumonia EXAM: CHEST - 2 VIEW COMPARISON:  06/13/2021 FINDINGS: Normal heart size post CABG. Mediastinal contours and pulmonary vascularity normal. LEFT nipple shadow again identified, also seen on earlier study of 07/21/2015. No pulmonary infiltrate, pleural effusion, or pneumothorax. Focus of scarring laterally in mid RIGHT  chest appears unchanged. No acute osseous findings. IMPRESSION: No acute abnormalities. Electronically Signed   By: Lavonia Dana M.D.   On: 08/29/2021 15:35     Assessment & Plan:   COPD with acute exacerbation ([place]) - AECOPD. Patient developed cough x 1 week with associated shortness of breath and wheezing. VSS, patient is non-toxic appearing. He has audible rhonchi and wheezing on exam. CXR showed no acute process, there was no evidence of pneumonia or pleural effusion. He received  Depo-medrol 60mg  IM x 1 in office. Sending in prescription for Augmentin twice daily x 10 days and advised him to stay on prednisone 40mg  x 5 days; then 30mg  x 3 days; 20mg  x 2 days; 10mg  x 3 days. Continue mucinex 1200mg  twice a day (take with glass of water). Advised he monitor BS closely the next 12 hours, iIf >350-400 please present to ED. Follow-up 2 weeks with [name] or Eustaquio Maize NP    Martyn Ehrich, NP 08/30/2021

## 2021-08-29 NOTE — Patient Instructions (Addendum)
CXR showed no acute process, there was no evidence of pneumonia or pleural effusion You were prescribed azithromycin by PCP, I will send in something stronger called Augmentin for 10 days We will give you a steroid shot today for wheezing heard on exam and extend your prednisone taper (see below instructions)  Recommendations: - Take Augmentin 1 tab twice daily x 10 days  - Stay on Prednisone 40mg  x 5 days; then 30mg  x 3 days; 20mg  x 2 days; 10mg  x 3 days - Continue mucinex 1200mg  twice a day (take with glass of water) - Monitor BS closely the next 12 hours, may need to bolus insulin dose to maintain glucose levels within normal range. If >350-400 please present to ED   Orders: - Depo-medrol 60mg  IM x 1  - CXR today (done)  Follow-up: - 2 weeks with [name] or Eustaquio Maize NP

## 2021-08-30 DIAGNOSIS — J441 Chronic obstructive pulmonary disease with (acute) exacerbation: Secondary | ICD-10-CM | POA: Insufficient documentation

## 2021-08-30 NOTE — Assessment & Plan Note (Addendum)
-   AECOPD. Patient developed cough x 1 week with associated shortness of breath and wheezing. VSS, patient is non-toxic appearing. He has audible rhonchi and wheezing on exam. CXR showed no acute process, there was no evidence of pneumonia or pleural effusion. He received Depo-medrol 60mg  IM x 1 in office. Sending in prescription for Augmentin twice daily x 10 days and advised him to stay on prednisone 40mg  x 5 days; then 30mg  x 3 days; 20mg  x 2 days; 10mg  x 3 days. Continue mucinex 1200mg  twice a day (take with glass of water). Advised he monitor BS closely the next 12 hours, iIf >350-400 please present to ED. Follow-up 2 weeks with [name] or Eustaquio Maize NP

## 2021-09-12 ENCOUNTER — Ambulatory Visit (INDEPENDENT_AMBULATORY_CARE_PROVIDER_SITE_OTHER): Payer: Medicare Other | Admitting: Primary Care

## 2021-09-12 ENCOUNTER — Encounter: Payer: Self-pay | Admitting: Primary Care

## 2021-09-12 ENCOUNTER — Other Ambulatory Visit: Payer: Self-pay

## 2021-09-12 VITALS — BP 134/64 | HR 91 | Temp 97.8°F | Ht 69.0 in | Wt 195.8 lb

## 2021-09-12 DIAGNOSIS — R0602 Shortness of breath: Secondary | ICD-10-CM

## 2021-09-12 DIAGNOSIS — J441 Chronic obstructive pulmonary disease with (acute) exacerbation: Secondary | ICD-10-CM | POA: Diagnosis not present

## 2021-09-12 DIAGNOSIS — R600 Localized edema: Secondary | ICD-10-CM | POA: Diagnosis not present

## 2021-09-12 DIAGNOSIS — J310 Chronic rhinitis: Secondary | ICD-10-CM | POA: Insufficient documentation

## 2021-09-12 DIAGNOSIS — J00 Acute nasopharyngitis [common cold]: Secondary | ICD-10-CM

## 2021-09-12 LAB — BRAIN NATRIURETIC PEPTIDE: Pro B Natriuretic peptide (BNP): 63 pg/mL (ref 0.0–100.0)

## 2021-09-12 MED ORDER — LORATADINE 10 MG PO TABS: 10.0000 mg | ORAL_TABLET | Freq: Every day | ORAL | 2 refills | Status: AC

## 2021-09-12 MED ORDER — FLUTICASONE PROPIONATE 50 MCG/ACT NA SUSP: 1.0000 | Freq: Every day | NASAL | 0 refills | Status: DC

## 2021-09-12 NOTE — Assessment & Plan Note (Addendum)
-   Start loratadine 10mg  daily and flonase nasal spray 1 puff per nostril daily

## 2021-09-12 NOTE — Assessment & Plan Note (Signed)
-   Paitent reports increased BLE edema. Taking lasix 40mg  daily prn.  Hx grade 1 DD. Checking BNP. Follows with Dr. Marlou Porch with cardiology.

## 2021-09-12 NOTE — Progress Notes (Signed)
@Patient  ID: Oscar Robinson, male    DOB: 12/22/1945, 76 y.o.   MRN: [id number]  Chief Complaint  Patient presents with   Follow-up    Patient says he thinks his lung problem has cleared up but still having sinus problems.     Referring provider: [name], MD  HPI: 76 year old male, former smoker. PMH significant for COPD GOLD C, CAD, GERD. Patient of [name].   Previous [facility] pulmonary encounter: ROV 06/13/21 -- 76 yo man, hx severe COPD. Last seen by me in 12/2019.  He is hospitalized was hospitalized at [facility] in September for a community-acquired pneumonia and an acute exacerbation of his COPD. He is off prednisone now - was treated treated again in October w pred for persistent sx of wheeze, dyspnea. He notes that he has been having sweats in the am.  He is having some dyspnea in the am when he gets up, with exertion / chores. He is not back to an exercise routine. He was not sent home with oxygen. Very little cough.  PMH: CAD/CABG, hypertension, allergic rhinitis, GERD. Uses albuterol about , which is more than he used to need it Today he reports.  He is currently managed on Spiriva and Symbicort. Has a sore throat from Trelegy, stopped.  Is on Nexium twice daily. Flu shot up to date. Needs the new booster.   Chest x-ray performed today and reviewed by me shows hyperinflation, large-scale resolution of some right basilar infiltrate.  No effusion.  08/29/2021 Patient presents today for acute OV/cough. Hx COPD GOLD C and right lower lobe pneumonia/CAP. Patient reports acute symptoms of head and chest congestion x 1 week. Cough is productive with yellow mucus. He has associated shortness of breath with exertion and wheezing. He saw PCP last week and was given prescription for abx but is unsure name and prednisone taper.  We verified with pharmacy that he was prescribed azithromycin on 08/24/21. He has seen no improvement after taking medication. He was taking goodie powder d.t not  feeling well. He is using Symbicort and Spiriva as prescribed. He has been using nebulizer 2-3 times a day with short term improvement. No recent chest imaging.  COPD with acute exacerbation ([place]) - AECOPD. Patient developed cough x 1 week with associated shortness of breath and wheezing. VSS, patient is non-toxic appearing. He has audible rhonchi and wheezing on exam. CXR showed no acute process, there was no evidence of pneumonia or pleural effusion. He received Depo-medrol 60mg  IM x 1 in office. Sending in prescription for Augmentin twice daily x 10 days and advised him to stay on prednisone 40mg  x 5 days; then 30mg  x 3 days; 20mg  x 2 days; 10mg  x 3 days. Continue mucinex 1200mg  twice a day (take with glass of water). Advised he monitor BS closely the next 12 hours, iIf >350-400 please present to ED. Follow-up 2 weeks with [name] or Eustaquio Maize NP     09/12/2021- Interim hx  Patient presents today for 2 week follow-up for COPD exacerbation. He was given augmentin and prednioson taper during last visit which he has completed. He is feeling better but still dealing with some sinus congestion and shortness of breath. He also has new leg swelling. Wheezing is not as bad as it was No longer having cough   No Known Allergies  Immunization History  Administered Date(s) Administered   Fluad Quad(high Dose 65+) 04/27/2019, 04/26/2020, 05/15/2021   H1N1 07/09/2008   Influenza Split 05/06/2012, 06/21/2015  Influenza Whole 05/23/2010, 05/07/2011   Influenza, High Dose Seasonal PF 05/23/2016, 04/14/2018   Influenza,inj,Quad PF,6+ Mos 05/01/2013, 05/07/2014   Influenza-Unspecified 04/01/2017   PFIZER(Purple Top)SARS-COV-2 Vaccination 09/27/2019, 10/21/2019, 07/04/2020   Pneumococcal Conjugate-13 03/09/2014   Pneumococcal Polysaccharide-23 05/23/2010, 06/23/2018   Zoster, Live 12/05/2014    Past Medical History:  Diagnosis Date   Anemia    Anxiety    Arthritis    CKD (chronic kidney disease), stage  II    COPD (chronic obstructive pulmonary disease) ([place])    Coronary artery disease    a. s/p CABG 1999.   Depression    Diabetes mellitus    Diverticulosis    GERD (gastroesophageal reflux disease)    Hiatal hernia    History of echocardiogram    Echocardiogram 10/21: EF 60-65, no RWMA, Gr 1 DD, normal RVSF, RVSP 15.2    History of nuclear stress test    Myoview 10/21: EF 62, normal perfusion; low risk    Hypercholesteremia    Hypertension    Sleep apnea    had sleep study and negative per pt   Thyroid disease    Tobacco abuse    Tubular adenoma of colon     Tobacco History: Social History   Tobacco Use  Smoking Status Former   Packs/day: 2.00   Years: 35.00   Pack years: 70.00   Types: Cigarettes   Quit date: 09/27/1997   Years since quitting: 23.9  Smokeless Tobacco Never   Counseling given: Not Answered   Outpatient Medications Prior to Visit  Medication Sig Dispense Refill   albuterol (PROAIR HFA) 108 (90 Base) MCG/ACT inhaler Inhale 2 puffs into the lungs every 4 (four) hours as needed for wheezing or shortness of breath. 1 each 3   albuterol (PROVENTIL) (2.5 MG/3ML) 0.083% nebulizer solution Take 3 mLs (2.5 mg total) by nebulization every 6 (six) hours as needed for wheezing or shortness of breath. 360 mL 12   ALPRAZolam (XANAX) 0.5 MG tablet Take 0.5 mg by mouth at bedtime.      amoxicillin-clavulanate (AUGMENTIN) 875-125 MG tablet Take 1 tablet by mouth 2 (two) times daily. 20 tablet 0   antiseptic oral rinse (BIOTENE) LIQD 15 mLs by Mouth Rinse route daily.     Ascorbic Acid (VITAMIN C) 1000 MG tablet Take 1,000 mg by mouth daily.     aspirin 81 MG tablet Take 81 mg by mouth at bedtime.      Cholecalciferol (VITAMIN D3) 50 MCG (2000 UT) TABS Take 1 tablet by mouth daily.     Coenzyme Q10 (CO Q 10) 100 MG CAPS Take 100 mg by mouth at bedtime.      colesevelam (WELCHOL) 625 MG tablet TAKE 3 TABLETS BY MOUTH TWICE DAILY WITH MEALS 540 tablet 2   doxepin  (SINEQUAN) 50 MG capsule Take 100 mg by mouth at bedtime.      esomeprazole (NEXIUM) 20 MG capsule Take 20 mg by mouth 2 (two) times daily.     HUMALOG 100 UNIT/ML injection 80 Units.   6   hydrochlorothiazide (HYDRODIURIL) 25 MG tablet Take 1 tablet by mouth once daily 90 tablet 3   levothyroxine (SYNTHROID) 88 MCG tablet Take 100 mcg by mouth every morning.     losartan (COZAAR) 100 MG tablet Take 1 tablet by mouth once daily 90 tablet 3   metFORMIN (GLUCOPHAGE) 500 MG tablet Take 1 tablet (500 mg total) by mouth 2 (two) times daily with a meal.     metoprolol succinate (  TOPROL-XL) 50 MG 24 hr tablet TAKE 1 TABLET BY MOUTH ONCE DAILY WITH  OR  IMMEDIATELY  FOLLOWING  A  MEAL 90 tablet 3   Multiple Vitamins-Minerals (CENTRUM SILVER 50+MEN) TABS Take 1 tablet by mouth daily.     nitroGLYCERIN (NITROSTAT) 0.4 MG SL tablet Place 1 tablet (0.4 mg total) under the tongue every 5 (five) minutes as needed for chest pain. 25 tablet 3   Omega-3 Fatty Acids (FISH OIL) 600 MG CAPS Take 1,400 mg by mouth at bedtime.      pravastatin (PRAVACHOL) 40 MG tablet Take 1 tablet by mouth once daily 90 tablet 3   predniSONE (DELTASONE) 10 MG tablet Take 4 tabs by mouth qd for 3 days, then 3 tabs qd for 3 days, 2 tabs qd for 3 days, then 1 tab qd for 3 days, then stop 30 tablet 0   SPIRIVA RESPIMAT 2.5 MCG/ACT AERS INHALE 2 SPRAY(S) BY MOUTH ONCE DAILY 4 g 7   SSD 1 % cream Apply topically as needed.     SYMBICORT 160-4.5 MCG/ACT inhaler INHALE 2 PUFFS BY MOUTH EVERY 12 HOURS, RINSE MOUTH AFTER EVERY USE. 11 g 5   furosemide (LASIX) 40 MG tablet Take 1 tablet (40 mg total) by mouth daily as needed (Ankle Swelling). 90 tablet 3   Facility-Administered Medications Prior to Visit  Medication Dose Route Frequency Provider Last Rate Last Admin   0.9 %  sodium chloride infusion  500 mL Intravenous Continuous Milus Banister, MD       Review of Systems  Review of Systems  Constitutional: Negative.   HENT:  Positive  for congestion.   Respiratory:  Positive for shortness of breath. Negative for cough.   Cardiovascular:  Positive for leg swelling.    Physical Exam  BP 134/64 (BP Location: Right Arm, Patient Position: Sitting, Cuff Size: Normal)    Pulse 91    Temp 97.8 F (36.6 C) (Oral)    Ht 5\' 9"  (1.753 m)    Wt 195 lb 12.8 oz (88.8 kg)    SpO2 94%    BMI 28.91 kg/m  Physical Exam Constitutional:      General: He is in acute distress.     Appearance: Normal appearance. He is not ill-appearing.  HENT:     Head: Normocephalic and atraumatic.     Mouth/Throat:     Mouth: Mucous membranes are moist.     Pharynx: Oropharynx is clear.  Cardiovascular:     Rate and Rhythm: Normal rate and regular rhythm.     Comments: +3 BLE edema Pulmonary:     Effort: Pulmonary effort is normal.     Breath sounds: Wheezing present. No rhonchi.     Comments: -Continues to have some scattered wheezing t/o, less rhonchi  Musculoskeletal:        General: Normal range of motion.     Cervical back: Normal range of motion and neck supple.  Skin:    General: Skin is warm and dry.  Neurological:     General: No focal deficit present.     Mental Status: He is alert and oriented to person, place, and time. Mental status is at baseline.  Psychiatric:        Mood and Affect: Mood normal.        Behavior: Behavior normal.        Thought Content: Thought content normal.        Judgment: Judgment normal.     Lab Results:  CBC    Component Value Date/Time   WBC 7.2 03/14/2021 1226   WBC 8.6 07/31/2018 1438   RBC 4.26 03/14/2021 1226   RBC 4.22 07/31/2018 1438   HGB 12.9 (L) 03/14/2021 1226   HCT 39.2 03/14/2021 1226   PLT 227 03/14/2021 1226   MCV 92 03/14/2021 1226   MCH 30.3 03/14/2021 1226   MCH 21.3 (L) 06/05/2016 1055   MCHC 32.9 03/14/2021 1226   MCHC 33.1 07/31/2018 1438   RDW 14.1 03/14/2021 1226   LYMPHSABS 2.0 07/31/2018 1438   MONOABS 1.1 (H) 07/31/2018 1438   EOSABS 0.2 07/31/2018 1438    BASOSABS 0.1 07/31/2018 1438    BMET    Component Value Date/Time   NA 140 03/14/2021 1226   K 4.3 03/14/2021 1226   CL 103 03/14/2021 1226   CO2 24 03/14/2021 1226   GLUCOSE 156 (H) 03/14/2021 1226   GLUCOSE 140 (H) 06/05/2016 1055   BUN 17 03/14/2021 1226   CREATININE 1.30 (H) 03/14/2021 1226   CREATININE 1.26 (H) 06/05/2016 1055   CALCIUM 8.9 03/14/2021 1226   GFRNONAA 60 06/03/2020 1216   GFRAA 69 06/03/2020 1216    BNP No results found for: BNP  ProBNP    Component Value Date/Time   PROBNP 73 01/29/2018 1436    Imaging: DG Chest 2 View  Result Date: 08/29/2021 CLINICAL DATA:  COPD exacerbation question pneumonia EXAM: CHEST - 2 VIEW COMPARISON:  06/13/2021 FINDINGS: Normal heart size post CABG. Mediastinal contours and pulmonary vascularity normal. LEFT nipple shadow again identified, also seen on earlier study of 07/21/2015. No pulmonary infiltrate, pleural effusion, or pneumothorax. Focus of scarring laterally in mid RIGHT chest appears unchanged. No acute osseous findings. IMPRESSION: No acute abnormalities. Electronically Signed   By: Lavonia Dana M.D.   On: 08/29/2021 15:35     Assessment & Plan:   COPD with acute exacerbation ([facility]) - Hx severe COPD. Maintained on Symbicort 160 and Spiriva respimat. Treated for acute exacerbation two weeks ago. His symptoms have improved after completing  Augmentin course and prednisone taper. He is experiencing less wheezing. Still having some shortness of breath. His lung sounds have considerably improved from 2 weeks ago. CXR on 08/29/21 showed no acute process. No indication for further abx, holding off on additional prednisone at this time.   Lower extremity edema - Paitent reports increased BLE edema. Taking lasix 40mg  daily prn.  Hx grade 1 DD. Checking BNP. Follows with Dr. Marlou Porch with cardiology.  Rhinitis - Start loratadine 10mg  daily and flonase nasal spray 1 puff per nostril daily   Martyn Ehrich,  NP 09/12/2021

## 2021-09-12 NOTE — Assessment & Plan Note (Addendum)
-   Hx severe COPD. Maintained on Symbicort 160 and Spiriva respimat. Treated for acute exacerbation two weeks ago. His symptoms have improved after completing  Augmentin course and prednisone taper. He is experiencing less wheezing. Still having some shortness of breath. His lung sounds have considerably improved from 2 weeks ago. CXR on 08/29/21 showed no acute process. No indication for further abx, holding off on additional prednisone at this time.

## 2021-09-12 NOTE — Patient Instructions (Signed)
Recommendations: - Continue Symbicort 160 two puffs morning and evening  - Continue Spiriva respimat 2.57mcg two puffs daily in the morning - Use albuterol nebulizer every 6 hours for wheezing  - Start loratadine (Claritin) daily AND flonase nasal spray daily for sinus congestion - Once labs come back I will direct you on lasix dosing   Orders: - Labs today to monitor fluid level  Follow-up: - 2-4 weeks with [name] or Eustaquio Maize NP

## 2021-09-12 NOTE — Progress Notes (Signed)
Please let patient know his BNP was normal. Leg swelling likely dependent edema or from prednisone. I would continue to take lasix 40mg  daily for another 2-3 days and if not better follow-up with Dr. Marlou Porch office

## 2021-09-14 DIAGNOSIS — R600 Localized edema: Secondary | ICD-10-CM | POA: Diagnosis not present

## 2021-09-14 DIAGNOSIS — R234 Changes in skin texture: Secondary | ICD-10-CM | POA: Diagnosis not present

## 2021-09-14 DIAGNOSIS — E119 Type 2 diabetes mellitus without complications: Secondary | ICD-10-CM | POA: Diagnosis not present

## 2021-09-18 ENCOUNTER — Telehealth: Payer: Self-pay | Admitting: Pulmonary Disease

## 2021-09-18 ENCOUNTER — Encounter: Payer: Self-pay | Admitting: Pulmonary Disease

## 2021-09-18 ENCOUNTER — Other Ambulatory Visit: Payer: Medicare Other

## 2021-09-18 ENCOUNTER — Ambulatory Visit (INDEPENDENT_AMBULATORY_CARE_PROVIDER_SITE_OTHER): Payer: Medicare Other | Admitting: Pulmonary Disease

## 2021-09-18 ENCOUNTER — Other Ambulatory Visit: Payer: Self-pay

## 2021-09-18 DIAGNOSIS — M19049 Primary osteoarthritis, unspecified hand: Secondary | ICD-10-CM | POA: Diagnosis not present

## 2021-09-18 DIAGNOSIS — R059 Cough, unspecified: Secondary | ICD-10-CM

## 2021-09-18 DIAGNOSIS — Z9641 Presence of insulin pump (external) (internal): Secondary | ICD-10-CM | POA: Diagnosis not present

## 2021-09-18 DIAGNOSIS — E049 Nontoxic goiter, unspecified: Secondary | ICD-10-CM | POA: Diagnosis not present

## 2021-09-18 DIAGNOSIS — R600 Localized edema: Secondary | ICD-10-CM | POA: Diagnosis not present

## 2021-09-18 DIAGNOSIS — J441 Chronic obstructive pulmonary disease with (acute) exacerbation: Secondary | ICD-10-CM | POA: Diagnosis not present

## 2021-09-18 DIAGNOSIS — E1165 Type 2 diabetes mellitus with hyperglycemia: Secondary | ICD-10-CM | POA: Diagnosis not present

## 2021-09-18 DIAGNOSIS — I10 Essential (primary) hypertension: Secondary | ICD-10-CM | POA: Diagnosis not present

## 2021-09-18 DIAGNOSIS — E02 Subclinical iodine-deficiency hypothyroidism: Secondary | ICD-10-CM | POA: Diagnosis not present

## 2021-09-18 DIAGNOSIS — E7800 Pure hypercholesterolemia, unspecified: Secondary | ICD-10-CM | POA: Diagnosis not present

## 2021-09-18 MED ORDER — ALBUTEROL SULFATE HFA 108 (90 BASE) MCG/ACT IN AERS: 2.0000 | INHALATION_SPRAY | RESPIRATORY_TRACT | 3 refills | Status: DC | PRN

## 2021-09-18 MED ORDER — LEVOFLOXACIN 500 MG PO TABS: 500.0000 mg | ORAL_TABLET | Freq: Every day | ORAL | 0 refills | Status: DC

## 2021-09-18 NOTE — Patient Instructions (Signed)
°  X sputum culture   X levaquin 500 mg daily x 7 days  X refills on albuterol MDI   X lasix daily x 3-5 days then every other day

## 2021-09-18 NOTE — Assessment & Plan Note (Signed)
Unclear why he has had a relapse.  Seems like he improved for a few days and now is worse again with green sputum.  We will treat him with Levaquin since he has already received a course of Augmentin.  We will also send for sputum culture to rule out MDR organisms since he seems to have received multiple antibiotics. We will hold off on more prednisone since bronchospasm is not too bad, also previous steroids had caused her sugars to go high and did not want to combine that with a fluoroquinolone  he will use albuterol nebs 4 times a day if needed

## 2021-09-18 NOTE — Progress Notes (Signed)
° °  Subjective:    Patient ID: Oscar Robinson, male    DOB: 03/08/1946, 76 y.o.   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 Patient of [name] History of community-acquired pneumonia 2021 Treated 08/29/2021 for COPD exacerbation with Depo-Medrol, Augmentin and course of prednisone, chest x-ray no infiltrate follow-up office visit 2/7 with APP noted that he was improving, he had increased lower extremity edema, BNP was normal asked to take Lasix 40 mg daily  He felt improved for a few days but over the last 3 days he is again bringing up green sputum, he is able to cough up and show me some sputum.  He complains of increased shortness of breath and intermittent wheezing No sick contacts in the interim  Prednisone previously caused his sugars to go high and he had to increase his insulin Chest x-ray 1/24 independently reviewed shows clear lungs, area of scarring in the right mid chest as before   Significant tests/ events reviewed Echo 05/2020 normal  CT chest 08/2020 moderate emphysema  Past Medical History:  Diagnosis Date   Anemia    Anxiety    Arthritis    CKD (chronic kidney disease), stage II    COPD (chronic obstructive pulmonary disease) ([place])    Coronary artery disease    a. s/p CABG 1999.   Depression    Diabetes mellitus    Diverticulosis    GERD (gastroesophageal reflux disease)    Hiatal hernia    History of echocardiogram    Echocardiogram 10/21: EF 60-65, no RWMA, Gr 1 DD, normal RVSF, RVSP 15.2    History of nuclear stress test    Myoview 10/21: EF 62, normal perfusion; low risk    Hypercholesteremia    Hypertension    Sleep apnea    had sleep study and negative per pt   Thyroid disease    Tobacco abuse    Tubular adenoma of colon      Review of Systems neg for any significant sore throat, dysphagia, itching, sneezing, nasal congestion or excess/ purulent secretions, fever, chills, sweats, unintended wt loss, pleuritic or exertional cp,  hempoptysis, orthopnea pnd or change in chronic leg swelling. Also denies presyncope, palpitations, heartburn, abdominal pain, nausea, vomiting, diarrhea or change in bowel or urinary habits, dysuria,hematuria, rash, arthralgias, visual complaints, headache, numbness weakness or ataxia.     Objective:   Physical Exam  Gen. Pleasant, well-nourished, in no distress ENT - no thrush, no pallor/icterus,no post nasal drip Neck: No JVD, no thyromegaly, no carotid bruits Lungs: no use of accessory muscles, no dullness to percussion, occasional scattered rhonchi, no crackles Cardiovascular: Rhythm regular, heart sounds  normal, no murmurs or gallops, 2+ peripheral edema Musculoskeletal: No deformities, no cyanosis or clubbing        Assessment & Plan:

## 2021-09-18 NOTE — Assessment & Plan Note (Signed)
He has significant bipedal edema which may be related to steroids, BNP was normal previously and echo showed normal LV function and normal RV function. I have asked him to take Lasix daily for 4 to 5 days until swelling decreases and then go on alternate day dosing

## 2021-09-18 NOTE — Telephone Encounter (Signed)
Sputum culture has been ordered. Felicia with [facility] is aware and voiced her understanding.  Nothing further needed.

## 2021-09-22 ENCOUNTER — Telehealth: Payer: Self-pay | Admitting: Pulmonary Disease

## 2021-09-22 LAB — RESPIRATORY CULTURE OR RESPIRATORY AND SPUTUM CULTURE
MICRO NUMBER:: 13000615
SPECIMEN QUALITY:: ADEQUATE

## 2021-09-22 NOTE — Telephone Encounter (Signed)
Called and spoke with patient.  Dr. Bari Mantis results and recommendations given.  Understanding stated.  Nothing further at this time.    Rigoberto Noel, MD  09/20/2021  2:26 PM EST     Sputum culture showed haemophilus. This blood should be responsive to Levaquin antibiotic that was given to him [facility] he is feeling better

## 2021-09-26 ENCOUNTER — Ambulatory Visit (INDEPENDENT_AMBULATORY_CARE_PROVIDER_SITE_OTHER): Payer: Medicare Other

## 2021-09-26 ENCOUNTER — Ambulatory Visit (INDEPENDENT_AMBULATORY_CARE_PROVIDER_SITE_OTHER): Payer: Medicare Other | Admitting: Nurse Practitioner

## 2021-09-26 ENCOUNTER — Telehealth: Payer: Self-pay | Admitting: Pulmonary Disease

## 2021-09-26 ENCOUNTER — Other Ambulatory Visit: Payer: Self-pay

## 2021-09-26 ENCOUNTER — Encounter: Payer: Self-pay | Admitting: Nurse Practitioner

## 2021-09-26 VITALS — BP 134/64 | HR 116 | Temp 98.4°F | Ht 70.0 in | Wt 187.0 lb

## 2021-09-26 DIAGNOSIS — R600 Localized edema: Secondary | ICD-10-CM

## 2021-09-26 DIAGNOSIS — R059 Cough, unspecified: Secondary | ICD-10-CM | POA: Diagnosis not present

## 2021-09-26 DIAGNOSIS — R0602 Shortness of breath: Secondary | ICD-10-CM | POA: Diagnosis not present

## 2021-09-26 DIAGNOSIS — J441 Chronic obstructive pulmonary disease with (acute) exacerbation: Secondary | ICD-10-CM | POA: Diagnosis not present

## 2021-09-26 MED ORDER — PREDNISONE 10 MG PO TABS: ORAL_TABLET | ORAL | 0 refills | Status: DC

## 2021-09-26 MED ORDER — METHYLPREDNISOLONE ACETATE 80 MG/ML IJ SUSP: 80.0000 mg | Freq: Once | INTRAMUSCULAR | Status: DC

## 2021-09-26 MED ORDER — DM-GUAIFENESIN ER 30-600 MG PO TB12: 1.0000 | ORAL_TABLET | Freq: Two times a day (BID) | ORAL | 0 refills | Status: AC

## 2021-09-26 MED ORDER — BENZONATATE 200 MG PO CAPS: 200.0000 mg | ORAL_CAPSULE | Freq: Three times a day (TID) | ORAL | 1 refills | Status: DC | PRN

## 2021-09-26 NOTE — Telephone Encounter (Signed)
Tilden Dome, [place] notified patient of results per result note.  Will close encounter.

## 2021-09-26 NOTE — Telephone Encounter (Signed)
Oscar Noel, MD  09/20/2021  2:26 PM EST     Sputum culture showed haemophilus. This blood should be responsive to Levaquin antibiotic that was given to him [facility] he is feeling better    ATC patient x2-- call was disconnected.  Will call back

## 2021-09-26 NOTE — Patient Instructions (Addendum)
-  Continue Albuterol inhaler 2 puffs or 3 mL neb every 6 hours as needed for shortness of breath or wheezing. Notify if symptoms persist despite rescue inhaler/neb use. -Continue Nexium 20 Twice daily  -Continue flonase 1 spray each nostril daily -Continue Symbicort 2 puffs Twice daily. Brush tongue and rinse mouth afterwards -Continue Spiriva 1 puff daily -Continue lasix 20 mg every other day as previously prescribed. Follow up with cardiology -Continue claritin 10 mg daily for allergies   -Mucinex DM Twice daily  -Tessalon perles 200 mg every 8 hours as needed for cough -Prednisone taper. 4 tabs for 3 days, then 3 tabs for 3 days, 2 tabs for 3 days, then 1 tab for 3 days, then stop. Take in AM with food  Chest x ray. We will notify you of any abnormal results.   Follow up in 2 weeks with [name] or Alanson Aly. If symptoms do not improve or worsen, please contact office for sooner follow up or seek emergency care.

## 2021-09-26 NOTE — Progress Notes (Signed)
@Patient  ID: Oscar Robinson, male    DOB: [birth year], 76 y.o.   MRN: [id number]  Chief Complaint  Patient presents with   Acute Visit    C/o increased sob and cough-green,gray.Wheezing    Referring provider: [name], MD  HPI: 76 year old male, former smoker (70 pack years) followed for COPD and rhinitis. He is a patient of [name] and last seen in office on 09/18/2021. Past medical history significant for CAD, GERD, DM, HLD.   TEST/EVENTS:  05/24/2020 echocardiogram: EF 60 to 65%.  G1 DD.  Otherwise normal exam. 08/24/2020 PFTs: FVC 2.3 (55), FEV1 1.26 (41), ratio 55 TLC 95%, DLCOunc 58%.  Positive BD (16%).  Severe obstructive airway disease with decreased diffusion capacity 08/29/2021 CXR 2 view: Reviewed by me with normal heart size status post CABG.  Pulmonary vasculature is normal.  No acute process noted.  Scarring in mid right lateral chest is unchanged.  08/29/2021: [name] with [name] NP.  Acute symptoms of head and chest congestion with productive cough x1 week.  Associated shortness of breath with exertion and wheezing.  Saw PCP last week and was given prescription for antibiotics but is unsure name and prednisone taper.  Verified with pharmacy that antibiotic was azithromycin.  Reported minimal improvement after taking.  CXR obtained without acute process.  Treated for AECOPD with Depo injection and Augmentin course.  Advised to stay on prednisone taper.  Continue Mucinex 1200 mg twice a day.  Advised follow-up in 2 weeks.  09/12/2021: [name] with [name] NP for follow-up.  Previously treated with Augmentin and prednisone taper.  Reported still having some sinus congestion shortness of breath and new leg swelling.  Wheezing is not as bad.  No longer having cough.  BNP was obtained and normal.  Treated with increased Lasix course.  Continued on Symbicort and Spiriva.  Started Claritin and Flonase for sinus congestion.  09/18/2021: OV with [name]. Previously seen by [name], NP and  treated for AECOPD.  Reported that he has started to feel better however over the past few days before this visit he reported increase in productive cough and worsening shortness of breath with increased wheezing.  Denied any sick exposures in the interim.  Sputum culture changed which grew H. influenzae.  Treated with Levaquin.  Held off on more prednisone due to increased blood sugars.  Advised to increase Lasix to every day for 3 to 5 days then resume every other day.  09/26/2021: Today-acute visit Patient presents today for persistent shortness of breath and cough.  He states that his cough has improved some and his sputum has started to lighten more.  He completed his Levaquin course on Sunday.  He has noticed persistent shortness of breath with exertion and associated wheezing.  The swelling in his legs has significantly improved.  He is transitioning to every other day dosing starting today.  He continues on Symbicort and Spiriva.  He has been using his albuterol up to 10 times a day.  He denies any fevers or chills.  He denies any sick exposures since being seen last.  No Known Allergies  Immunization History  Administered Date(s) Administered   Fluad Quad(high Dose 65+) 04/27/2019, 04/26/2020, 05/15/2021   H1N1 07/09/2008   Influenza Split 05/06/2012, 06/21/2015   Influenza Whole 05/23/2010, 05/07/2011   Influenza, High Dose Seasonal PF 05/23/2016, 04/14/2018   Influenza,inj,Quad PF,6+ Mos 05/01/2013, 05/07/2014   Influenza-Unspecified 04/01/2017   PFIZER(Purple Top)SARS-COV-2 Vaccination 09/27/2019, 10/21/2019, 07/04/2020   Pneumococcal Conjugate-13 03/09/2014  Pneumococcal Polysaccharide-23 05/23/2010, 06/23/2018   Zoster, Live 12/05/2014    Past Medical History:  Diagnosis Date   Anemia    Anxiety    Arthritis    CKD (chronic kidney disease), stage II    COPD (chronic obstructive pulmonary disease) ([place])    Coronary artery disease    a. s/p CABG 1999.   Depression     Diabetes mellitus    Diverticulosis    GERD (gastroesophageal reflux disease)    Hiatal hernia    History of echocardiogram    Echocardiogram 10/21: EF 60-65, no RWMA, Gr 1 DD, normal RVSF, RVSP 15.2    History of nuclear stress test    Myoview 10/21: EF 62, normal perfusion; low risk    Hypercholesteremia    Hypertension    Sleep apnea    had sleep study and negative per pt   Thyroid disease    Tobacco abuse    Tubular adenoma of colon     Tobacco History: Social History   Tobacco Use  Smoking Status Former   Packs/day: 2.00   Years: 35.00   Pack years: 70.00   Types: Cigarettes   Quit date: 09/27/1997   Years since quitting: 24.0  Smokeless Tobacco Never   Counseling given: Not Answered   Outpatient Medications Prior to Visit  Medication Sig Dispense Refill   albuterol (PROAIR HFA) 108 (90 Base) MCG/ACT inhaler Inhale 2 puffs into the lungs every 4 (four) hours as needed for wheezing or shortness of breath. 1 each 3   albuterol (PROVENTIL) (2.5 MG/3ML) 0.083% nebulizer solution Take 3 mLs (2.5 mg total) by nebulization every 6 (six) hours as needed for wheezing or shortness of breath. 360 mL 12   ALPRAZolam (XANAX) 0.5 MG tablet Take 0.5 mg by mouth at bedtime.      antiseptic oral rinse (BIOTENE) LIQD 15 mLs by Mouth Rinse route daily.     Ascorbic Acid (VITAMIN C) 1000 MG tablet Take 1,000 mg by mouth daily.     aspirin 81 MG tablet Take 81 mg by mouth at bedtime.      Cholecalciferol (VITAMIN D3) 50 MCG (2000 UT) TABS Take 1 tablet by mouth daily.     Coenzyme Q10 (CO Q 10) 100 MG CAPS Take 100 mg by mouth at bedtime.      colesevelam (WELCHOL) 625 MG tablet TAKE 3 TABLETS BY MOUTH TWICE DAILY WITH MEALS 540 tablet 2   doxepin (SINEQUAN) 50 MG capsule Take 100 mg by mouth at bedtime.      esomeprazole (NEXIUM) 20 MG capsule Take 20 mg by mouth 2 (two) times daily.     fluticasone (FLONASE) 50 MCG/ACT nasal spray Place 1 spray into both nostrils daily. 16 g 0    HUMALOG 100 UNIT/ML injection 80 Units.   6   hydrochlorothiazide (HYDRODIURIL) 25 MG tablet Take 1 tablet by mouth once daily 90 tablet 3   levothyroxine (SYNTHROID) 88 MCG tablet Take 100 mcg by mouth every morning.     loratadine (CLARITIN) 10 MG tablet Take 1 tablet (10 mg total) by mouth daily. 30 tablet 2   losartan (COZAAR) 100 MG tablet Take 1 tablet by mouth once daily 90 tablet 3   metFORMIN (GLUCOPHAGE) 500 MG tablet Take 1 tablet (500 mg total) by mouth 2 (two) times daily with a meal.     metoprolol succinate (TOPROL-XL) 50 MG 24 hr tablet TAKE 1 TABLET BY MOUTH ONCE DAILY WITH  OR  IMMEDIATELY  FOLLOWING  A  MEAL 90 tablet 3   Multiple Vitamins-Minerals (CENTRUM SILVER 50+MEN) TABS Take 1 tablet by mouth daily.     nitroGLYCERIN (NITROSTAT) 0.4 MG SL tablet Place 1 tablet (0.4 mg total) under the tongue every 5 (five) minutes as needed for chest pain. 25 tablet 3   Omega-3 Fatty Acids (FISH OIL) 600 MG CAPS Take 1,400 mg by mouth at bedtime.      pravastatin (PRAVACHOL) 40 MG tablet Take 1 tablet by mouth once daily 90 tablet 3   SPIRIVA RESPIMAT 2.5 MCG/ACT AERS INHALE 2 SPRAY(S) BY MOUTH ONCE DAILY 4 g 7   SSD 1 % cream Apply topically as needed.     SYMBICORT 160-4.5 MCG/ACT inhaler INHALE 2 PUFFS BY MOUTH EVERY 12 HOURS, RINSE MOUTH AFTER EVERY USE. 11 g 5   furosemide (LASIX) 40 MG tablet Take 1 tablet (40 mg total) by mouth daily as needed (Ankle Swelling). 90 tablet 3   levofloxacin (LEVAQUIN) 500 MG tablet Take 1 tablet (500 mg total) by mouth daily. (Patient not taking: Reported on 09/26/2021) 7 tablet 0   Facility-Administered Medications Prior to Visit  Medication Dose Route Frequency Provider Last Rate Last Admin   0.9 %  sodium chloride infusion  500 mL Intravenous Continuous Milus Banister, MD         Review of Systems:   Constitutional: No weight loss or gain, night sweats, fevers, chills. +fatigue HEENT: No headaches, difficulty swallowing, tooth/dental  problems, or sore throat. No sneezing, itching, ear ache, nasal congestion, or post nasal drip CV:  No chest pain, orthopnea, PND, swelling in lower extremities, anasarca, dizziness, palpitations, syncope Resp: +shortness of breath with exertion; productive cough (improving; sputum changed from green to gray/yellow and less copious); wheezing.  No hemoptysis.  No chest wall deformity GI:  No heartburn, indigestion, abdominal pain, nausea, vomiting, diarrhea, change in bowel habits, loss of appetite, bloody stools.  GU: No dysuria, change in color of urine, urgency or frequency.  No flank pain, no hematuria  Skin: No rash, lesions, ulcerations MSK:  No joint pain or swelling.  No decreased range of motion.  No back pain. Neuro: No dizziness or lightheadedness.  Psych: No depression or anxiety. Mood stable.     Physical Exam:  BP 134/64 (BP Location: Left Arm, Cuff Size: Normal)    Pulse (!) 116    Temp 98.4 F (36.9 C) (Temporal)    Ht 5\' 10"  (1.778 m)    Wt 187 lb (84.8 kg)    SpO2 94%    BMI 26.83 kg/m   GEN: Pleasant, interactive, well-appearing; elderly; in no acute distress. HEENT:  Normocephalic and atraumatic. EACs patent bilaterally. TM pearly gray with present light reflex bilaterally. PERRLA. Sclera white. Nasal turbinates pink, moist and patent bilaterally. No rhinorrhea present. Oropharynx erythematous and moist, without exudate or edema. No lesions, ulcerations, or postnasal drip.  NECK:  Supple w/ fair ROM. No JVD present. Normal carotid impulses w/o bruits. Thyroid symmetrical with no goiter or nodules palpated. No lymphadenopathy.   CV: RRR, no m/r/g, no peripheral edema. Pulses intact, +2 bilaterally. No cyanosis, pallor or clubbing. PULMONARY:  Unlabored, regular breathing. Scattered rhonchi and expiratory wheezes bilaterally A&P. No accessory muscle use. No dullness to percussion. GI: BS present and normoactive. Soft, non-tender to palpation. No organomegaly or masses  detected. No CVA tenderness. MSK: No erythema, warmth or tenderness. Cap refil <2 sec all extrem. No deformities or joint swelling noted.  Neuro: A/Ox3. No focal deficits noted.  Skin: Warm, no lesions or rashe Psych: Normal affect and behavior. Judgement and thought content appropriate.     Lab Results:  CBC    Component Value Date/Time   WBC 7.2 03/14/2021 1226   WBC 8.6 07/31/2018 1438   RBC 4.26 03/14/2021 1226   RBC 4.22 07/31/2018 1438   HGB 12.9 (L) 03/14/2021 1226   HCT 39.2 03/14/2021 1226   PLT 227 03/14/2021 1226   MCV 92 03/14/2021 1226   MCH 30.3 03/14/2021 1226   MCH 21.3 (L) 06/05/2016 1055   MCHC 32.9 03/14/2021 1226   MCHC 33.1 07/31/2018 1438   RDW 14.1 03/14/2021 1226   LYMPHSABS 2.0 07/31/2018 1438   MONOABS 1.1 (H) 07/31/2018 1438   EOSABS 0.2 07/31/2018 1438   BASOSABS 0.1 07/31/2018 1438    BMET    Component Value Date/Time   NA 140 03/14/2021 1226   K 4.3 03/14/2021 1226   CL 103 03/14/2021 1226   CO2 24 03/14/2021 1226   GLUCOSE 156 (H) 03/14/2021 1226   GLUCOSE 140 (H) 06/05/2016 1055   BUN 17 03/14/2021 1226   CREATININE 1.30 (H) 03/14/2021 1226   CREATININE 1.26 (H) 06/05/2016 1055   CALCIUM 8.9 03/14/2021 1226   GFRNONAA 60 06/03/2020 1216   GFRAA 69 06/03/2020 1216    BNP No results found for: BNP   Imaging:  09/26/2021: CXR reviewed by me.  Unchanged from previous x-ray.  Post CABG. no evidence of acute abnormalities.  DG Chest 2 View  Result Date: 09/26/2021 CLINICAL DATA:  Shortness of breath, productive cough EXAM: CHEST - 2 VIEW COMPARISON:  08/29/2021 FINDINGS: Normal heart size post CABG. Mediastinal contours and pulmonary vascularity normal. Lungs clear. No pulmonary infiltrate, pleural effusion, or pneumothorax. Osseous structures unremarkable. IMPRESSION: Post CABG. No acute abnormalities. Electronically Signed   By: Lavonia Dana M.D.   On: 09/26/2021 12:59   DG Chest 2 View  Result Date: 08/29/2021 CLINICAL DATA:   COPD exacerbation question pneumonia EXAM: CHEST - 2 VIEW COMPARISON:  06/13/2021 FINDINGS: Normal heart size post CABG. Mediastinal contours and pulmonary vascularity normal. LEFT nipple shadow again identified, also seen on earlier study of 07/21/2015. No pulmonary infiltrate, pleural effusion, or pneumothorax. Focus of scarring laterally in mid RIGHT chest appears unchanged. No acute osseous findings. IMPRESSION: No acute abnormalities. Electronically Signed   By: Lavonia Dana M.D.   On: 08/29/2021 15:35      PFT Results Latest Ref Rng & Units 08/24/2020 04/07/2014  FVC-Pre L 1.93 2.49  FVC-Predicted Pre % 46 57  FVC-Post L 2.30 2.84  FVC-Predicted Post % 55 65  Pre FEV1/FVC % % 56 51  Post FEV1/FCV % % 55 56  FEV1-Pre L 1.09 1.28  FEV1-Predicted Pre % 35 39  FEV1-Post L 1.26 1.60  DLCO uncorrected ml/min/mmHg 14.42 14.73  DLCO UNC% % 58 47  DLCO corrected ml/min/mmHg 14.42 -  DLCO COR %Predicted % 58 -  DLVA Predicted % 95 57  TLC L 6.52 6.10  TLC % Predicted % 95 89  RV % Predicted % 138 145    Lab Results  Component Value Date   NITRICOXIDE 6 05/09/2016        Assessment & Plan:   COPD with acute exacerbation ([place]) Minimally improved SOB.  Appears that cough is improving.  No evidence of pneumonia on CXR today.  Treated with 3 courses of antibiotics previously, including levaquin most recently.  Evidence of bronchospasm on exam.  Depo 80 mg x 1 in office.  Prednisone taper.  Advised to monitor blood sugars and if persistently >350 despite intervention, seek further care.  Continue triple therapy inhaler regimen.  Albuterol as needed.  He has had multiple flares within the last few months.  May benefit from daily prednisone and/or Daliresp.  Will reevaluate at next visit.  Oxygen stable at visit.  Evidence of asthmatic component on previous PFTs.  Could also consider addition of Singulair if flare occurs with allergy season.  Advised to continue Claritin and Flonase.  Patient  Instructions  -Continue Albuterol inhaler 2 puffs or 3 mL neb every 6 hours as needed for shortness of breath or wheezing. Notify if symptoms persist despite rescue inhaler/neb use. -Continue Nexium 20 Twice daily  -Continue flonase 1 spray each nostril daily -Continue Symbicort 2 puffs Twice daily. Brush tongue and rinse mouth afterwards -Continue Spiriva 1 puff daily -Continue lasix 20 mg every other day as previously prescribed. Follow up with cardiology -Continue claritin 10 mg daily for allergies   -Mucinex DM Twice daily  -Tessalon perles 200 mg every 8 hours as needed for cough -Prednisone taper. 4 tabs for 3 days, then 3 tabs for 3 days, 2 tabs for 3 days, then 1 tab for 3 days, then stop. Take in AM with food  Chest x ray. We will notify you of any abnormal results.   Follow up in 2 weeks with [name] or Alanson Aly. If symptoms do not improve or worsen, please contact office for sooner follow up or seek emergency care.   Lower extremity edema Appears resolved on exam.  Patient is transitioning to every other day Lasix dosing.  Advised to notify if worsening leg swelling occurs.     Clayton Bibles, NP 09/26/2021  Pt aware and understands NP's role.

## 2021-09-26 NOTE — Assessment & Plan Note (Addendum)
Minimally improved SOB.  Appears that cough is improving.  No evidence of pneumonia on CXR today.  Treated with 3 courses of antibiotics previously, including levaquin most recently.  Evidence of bronchospasm on exam.  Depo 80 mg x 1 in office.  Prednisone taper.  Advised to monitor blood sugars and if persistently >350 despite intervention, seek further care.  Continue triple therapy inhaler regimen.  Albuterol as needed.  He has had multiple flares within the last few months.  May benefit from daily prednisone and/or Daliresp.  Will reevaluate at next visit.  Oxygen stable at visit.  Evidence of asthmatic component on previous PFTs.  Could also consider addition of Singulair if flare occurs with allergy season.  Advised to continue Claritin and Flonase.  Patient Instructions  -Continue Albuterol inhaler 2 puffs or 3 mL neb every 6 hours as needed for shortness of breath or wheezing. Notify if symptoms persist despite rescue inhaler/neb use. -Continue Nexium 20 Twice daily  -Continue flonase 1 spray each nostril daily -Continue Symbicort 2 puffs Twice daily. Brush tongue and rinse mouth afterwards -Continue Spiriva 1 puff daily -Continue lasix 20 mg every other day as previously prescribed. Follow up with cardiology -Continue claritin 10 mg daily for allergies   -Mucinex DM Twice daily  -Tessalon perles 200 mg every 8 hours as needed for cough -Prednisone taper. 4 tabs for 3 days, then 3 tabs for 3 days, 2 tabs for 3 days, then 1 tab for 3 days, then stop. Take in AM with food  Chest x ray. We will notify you of any abnormal results.   Follow up in 2 weeks with [name] or Alanson Aly. If symptoms do not improve or worsen, please contact office for sooner follow up or seek emergency care.

## 2021-09-26 NOTE — Progress Notes (Signed)
Pt notified of results. He was no better and requested ov today and appt has been set up.

## 2021-09-26 NOTE — Assessment & Plan Note (Signed)
Appears resolved on exam.  Patient is transitioning to every other day Lasix dosing.  Advised to notify if worsening leg swelling occurs.

## 2021-09-27 ENCOUNTER — Telehealth: Payer: Self-pay | Admitting: Pulmonary Disease

## 2021-09-27 NOTE — Progress Notes (Signed)
Please notify patient his CXR showed clear lungs without any evidence of superimposed infection. Continue with our plan as discussed yesterday. Thanks!

## 2021-09-27 NOTE — Progress Notes (Signed)
LMOM for patient to call office back. Call back information provided.

## 2021-09-27 NOTE — Telephone Encounter (Signed)
Lm for patient.  

## 2021-09-27 NOTE — Telephone Encounter (Signed)
Patient is returning phone call. Patient phone number is [phone].

## 2021-09-28 NOTE — Telephone Encounter (Signed)
Called and spoke to patient in regards to test results. Nothing further needed.

## 2021-10-06 ENCOUNTER — Ambulatory Visit: Payer: Medicare Other | Admitting: Primary Care

## 2021-10-06 ENCOUNTER — Encounter: Payer: Self-pay | Admitting: Nurse Practitioner

## 2021-10-06 ENCOUNTER — Ambulatory Visit (INDEPENDENT_AMBULATORY_CARE_PROVIDER_SITE_OTHER): Payer: Medicare Other | Admitting: Nurse Practitioner

## 2021-10-06 ENCOUNTER — Other Ambulatory Visit: Payer: Self-pay

## 2021-10-06 VITALS — BP 130/78 | HR 90 | Temp 98.2°F | Ht 70.0 in | Wt 188.8 lb

## 2021-10-06 DIAGNOSIS — J441 Chronic obstructive pulmonary disease with (acute) exacerbation: Secondary | ICD-10-CM | POA: Diagnosis not present

## 2021-10-06 DIAGNOSIS — K219 Gastro-esophageal reflux disease without esophagitis: Secondary | ICD-10-CM

## 2021-10-06 DIAGNOSIS — J00 Acute nasopharyngitis [common cold]: Secondary | ICD-10-CM | POA: Diagnosis not present

## 2021-10-06 DIAGNOSIS — R600 Localized edema: Secondary | ICD-10-CM

## 2021-10-06 DIAGNOSIS — J449 Chronic obstructive pulmonary disease, unspecified: Secondary | ICD-10-CM | POA: Diagnosis not present

## 2021-10-06 LAB — POCT EXHALED NITRIC OXIDE: FeNO level (ppb): 15

## 2021-10-06 MED ORDER — SYMBICORT 160-4.5 MCG/ACT IN AERO: 2.0000 | INHALATION_SPRAY | Freq: Two times a day (BID) | RESPIRATORY_TRACT | 2 refills | Status: DC

## 2021-10-06 MED ORDER — SPIRIVA RESPIMAT 2.5 MCG/ACT IN AERS: INHALATION_SPRAY | RESPIRATORY_TRACT | 5 refills | Status: DC

## 2021-10-06 MED ORDER — PREDNISONE 10 MG PO TABS: ORAL_TABLET | ORAL | 0 refills | Status: AC

## 2021-10-06 MED ORDER — ALBUTEROL SULFATE (2.5 MG/3ML) 0.083% IN NEBU: 2.5000 mg | INHALATION_SOLUTION | Freq: Four times a day (QID) | RESPIRATORY_TRACT | 5 refills | Status: AC | PRN

## 2021-10-06 MED ORDER — ALBUTEROL SULFATE HFA 108 (90 BASE) MCG/ACT IN AERS: 2.0000 | INHALATION_SPRAY | RESPIRATORY_TRACT | 3 refills | Status: DC | PRN

## 2021-10-06 NOTE — Assessment & Plan Note (Addendum)
Slowly resolving AECOPD. Discussed that he likely has some deconditioning related to his hospitalization and pna in October and then a URI in January. Cough has resolved. Improving activity intolerance. Will extend out prednisone taper for two more weeks. Continue triple therapy regimen. Does have some reversibility on his previous PFTs - FeNO nl today. No evidence of superimposed infection on recent CXR.  ? ?Patient Instructions  ?-Continue Albuterol inhaler 2 puffs or 3 mL neb every 6 hours as needed for shortness of breath or wheezing. Notify if symptoms persist despite rescue inhaler/neb use. ?-Continue Nexium 20 Twice daily  ?-Continue flonase 1 spray each nostril daily ?-Continue Symbicort 2 puffs Twice daily. Brush tongue and rinse mouth afterwards ?-Continue Spiriva 1 puff daily ?-Continue lasix 20 mg every other day  ?-Continue claritin 10 mg daily for allergies  ?-Can continue mucinex DM Twice daily as needed for chest congestion or cough ?-Decrease prednisone to 10 mg tomorrow. Continue prednisone 10 mg daily for an additional week then decrease to 5 mg a day for a week then stop. ? ?Notify if worsening breathlessness, cough, mucus production, fatigue, or wheezing occurs.  ?Maintain up to date vaccinations, including influenza, COVID, and pneumococcal.  ?Exercise, as tolerated. Notify if worsening symptoms upon exertion occur.  ? ?Follow up with cardiology regarding swelling in your legs if it does not improve once off prednisone  ?  ?Follow up in one month with [name]. If symptoms do not improve or worsen, please contact office for sooner follow up or seek emergency care. ? ? ?

## 2021-10-06 NOTE — Progress Notes (Signed)
@Patient  ID: Oscar Robinson, male    DOB: 11/25/1945, 76 y.o.   MRN: [id number]  Chief Complaint  Patient presents with   Follow-up    Follow up. Pt states he is still having SOB. Pt states Symbicort and Spriva are helping with his SOB.     Referring provider: [name], MD  HPI: 76 year old male, former smoker (70 pack years) followed for COPD and rhinitis.  He is a patient of [name] and last seen in office on 09/26/2021 by [facility] NP.  Past medical history significant for CAD, GERD, DM, HLD.  TEST/EVENTS:  05/24/2020 echocardiogram: EF 60 to 65%.  G1 DD.  Otherwise normal exam 08/24/2020 PFTs: FVC 2.3 (55), FEV1 1.26 (41), ratio 55, TLC 95%, DLCOunc 58%.  Positive BD (16%).  Severe obstructive airway disease with decreased diffusion capacity 08/29/2021 CXR 2 view: Normal heart size status post CABG.  Pulmonary vasculature normal.  Scarring in mid right lateral chest wall which is unchanged.  There was no acute process noted.  08/29/2021: [name] with [name] NP.  Acute symptoms of head and chest congestion with productive cough x1 week.  Associated shortness of breath with exertion and wheezing.  Saw PCP last week and was given prescription for antibiotics but is unsure name and prednisone taper.  Verified with pharmacy that antibiotic was azithromycin.  Reported minimal improvement after taking.  CXR obtained without acute process.  Treated for AECOPD with Depo injection and Augmentin course.  Advised to stay on prednisone taper.  Continue Mucinex 1200 mg twice a day.  Advised follow-up in 2 weeks.   09/12/2021: [name] with [name] NP for follow-up.  Previously treated with Augmentin and prednisone taper.  Reported still having some sinus congestion shortness of breath and new leg swelling.  Wheezing is not as bad.  No longer having cough.  BNP was obtained and normal.  Treated with increased Lasix course.  Continued on Symbicort and Spiriva.  Started Claritin and Flonase for sinus congestion.    09/18/2021: OV with [name]. Previously seen by [name], NP and treated for AECOPD.  Reported that he has started to feel better however over the past few days before this visit he reported increase in productive cough and worsening shortness of breath with increased wheezing.  Denied any sick exposures in the interim.  Sputum culture changed which grew H. influenzae.  Treated with Levaquin.  Held off on more prednisone due to increased blood sugars.  Advised to increase Lasix to every day for 3 to 5 days then resume every other day.  09/26/2021: OV with Davina Howlett NP.  Persistent shortness of breath and cough; felt cough had mildly improved with decreased sputum production.  Completed Levaquin course on Sunday. BLE edema had significantly improved. CXR obtained which was unchanged without evidence of superimposed infection. Previously tx with 3 courses of abx. Bronchospasm evident on exam - depo inj and prednisone taper ordered. Continued on triple therapy with Symbicort and Spiriva. Targeted cough suppression and postnasal drainage control. Close follow up.    10/06/2021: Today - follow up Patient presents today for follow up after being treated for URI and AECOPD. He has been seen multiple times since; feels as though he never fully recovered after having pneumonia in October and has had difficulties with his breathing since. He does feel as though he is doing better today. His cough has resolved. He still has shortness of breath with exertion but he has been able to go to the gym and  walk on the treadmill, which is significant improvement. He is still using his albuterol neb three times a day, in the morning, before he goes to the gym, and before bed. He continues on Symbicort and Spiriva. He hasn't had to use his rescue inhaler recently. He has been taking his lasix 20 mg daily. He does have some increased swelling in both lower legs but states that this happens every time he is on prednisone. He is still on his  prednisone taper with two days left. He denies any orthopnea, PND, chest pain, fevers, wheezing or palpitations. Overall, he is slowly improving.   No Known Allergies  Immunization History  Administered Date(s) Administered   Fluad Quad(high Dose 65+) 04/27/2019, 04/26/2020, 05/15/2021   H1N1 07/09/2008   Influenza Split 05/06/2012, 06/21/2015   Influenza Whole 05/23/2010, 05/07/2011   Influenza, High Dose Seasonal PF 05/23/2016, 04/14/2018   Influenza,inj,Quad PF,6+ Mos 05/01/2013, 05/07/2014   Influenza-Unspecified 04/01/2017   PFIZER(Purple Top)SARS-COV-2 Vaccination 09/27/2019, 10/21/2019, 07/04/2020   Pneumococcal Conjugate-13 03/09/2014   Pneumococcal Polysaccharide-23 05/23/2010, 06/23/2018   Zoster, Live 12/05/2014    Past Medical History:  Diagnosis Date   Anemia    Anxiety    Arthritis    CKD (chronic kidney disease), stage II    COPD (chronic obstructive pulmonary disease) ([place])    Coronary artery disease    a. s/p CABG 1999.   Depression    Diabetes mellitus    Diverticulosis    GERD (gastroesophageal reflux disease)    Hiatal hernia    History of echocardiogram    Echocardiogram 10/21: EF 60-65, no RWMA, Gr 1 DD, normal RVSF, RVSP 15.2    History of nuclear stress test    Myoview 10/21: EF 62, normal perfusion; low risk    Hypercholesteremia    Hypertension    Sleep apnea    had sleep study and negative per pt   Thyroid disease    Tobacco abuse    Tubular adenoma of colon     Tobacco History: Social History   Tobacco Use  Smoking Status Former   Packs/day: 2.00   Years: 35.00   Pack years: 70.00   Types: Cigarettes   Quit date: 09/27/1997   Years since quitting: 24.0  Smokeless Tobacco Never   Counseling given: Not Answered   Outpatient Medications Prior to Visit  Medication Sig Dispense Refill   ALPRAZolam (XANAX) 0.5 MG tablet Take 0.5 mg by mouth at bedtime.      antiseptic oral rinse (BIOTENE) LIQD 15 mLs by Mouth Rinse route daily.      Ascorbic Acid (VITAMIN C) 1000 MG tablet Take 1,000 mg by mouth daily.     aspirin 81 MG tablet Take 81 mg by mouth at bedtime.      benzonatate (TESSALON) 200 MG capsule Take 1 capsule (200 mg total) by mouth 3 (three) times daily as needed for cough. 30 capsule 1   Cholecalciferol (VITAMIN D3) 50 MCG (2000 UT) TABS Take 1 tablet by mouth daily.     Coenzyme Q10 (CO Q 10) 100 MG CAPS Take 100 mg by mouth at bedtime.      colesevelam (WELCHOL) 625 MG tablet TAKE 3 TABLETS BY MOUTH TWICE DAILY WITH MEALS 540 tablet 2   dextromethorphan-guaiFENesin (MUCINEX DM) 30-600 MG 12hr tablet Take 1 tablet by mouth 2 (two) times daily. 60 tablet 0   doxepin (SINEQUAN) 50 MG capsule Take 100 mg by mouth at bedtime.      esomeprazole (NEXIUM) 20 MG  capsule Take 20 mg by mouth 2 (two) times daily.     fluticasone (FLONASE) 50 MCG/ACT nasal spray Place 1 spray into both nostrils daily. 16 g 0   HUMALOG 100 UNIT/ML injection 80 Units.   6   hydrochlorothiazide (HYDRODIURIL) 25 MG tablet Take 1 tablet by mouth once daily 90 tablet 3   levothyroxine (SYNTHROID) 88 MCG tablet Take 100 mcg by mouth every morning.     loratadine (CLARITIN) 10 MG tablet Take 1 tablet (10 mg total) by mouth daily. 30 tablet 2   losartan (COZAAR) 100 MG tablet Take 1 tablet by mouth once daily 90 tablet 3   metFORMIN (GLUCOPHAGE) 500 MG tablet Take 1 tablet (500 mg total) by mouth 2 (two) times daily with a meal.     metoprolol succinate (TOPROL-XL) 50 MG 24 hr tablet TAKE 1 TABLET BY MOUTH ONCE DAILY WITH  OR  IMMEDIATELY  FOLLOWING  A  MEAL 90 tablet 3   Multiple Vitamins-Minerals (CENTRUM SILVER 50+MEN) TABS Take 1 tablet by mouth daily.     nitroGLYCERIN (NITROSTAT) 0.4 MG SL tablet Place 1 tablet (0.4 mg total) under the tongue every 5 (five) minutes as needed for chest pain. 25 tablet 3   Omega-3 Fatty Acids (FISH OIL) 600 MG CAPS Take 1,400 mg by mouth at bedtime.      pravastatin (PRAVACHOL) 40 MG tablet Take 1 tablet by mouth  once daily 90 tablet 3   predniSONE (DELTASONE) 10 MG tablet 4 tabs for 3 days, then 3 tabs for 3 days, 2 tabs for 3 days, then 1 tab for 3 days, then stop 30 tablet 0   SSD 1 % cream Apply topically as needed.     albuterol (PROAIR HFA) 108 (90 Base) MCG/ACT inhaler Inhale 2 puffs into the lungs every 4 (four) hours as needed for wheezing or shortness of breath. 1 each 3   albuterol (PROVENTIL) (2.5 MG/3ML) 0.083% nebulizer solution Take 3 mLs (2.5 mg total) by nebulization every 6 (six) hours as needed for wheezing or shortness of breath. 360 mL 12   levofloxacin (LEVAQUIN) 500 MG tablet Take 1 tablet (500 mg total) by mouth daily. 7 tablet 0   SPIRIVA RESPIMAT 2.5 MCG/ACT AERS INHALE 2 SPRAY(S) BY MOUTH ONCE DAILY 4 g 7   SYMBICORT 160-4.5 MCG/ACT inhaler INHALE 2 PUFFS BY MOUTH EVERY 12 HOURS, RINSE MOUTH AFTER EVERY USE. 11 g 5   furosemide (LASIX) 40 MG tablet Take 1 tablet (40 mg total) by mouth daily as needed (Ankle Swelling). 90 tablet 3   Facility-Administered Medications Prior to Visit  Medication Dose Route Frequency Provider Last Rate Last Admin   0.9 %  sodium chloride infusion  500 mL Intravenous Continuous Milus Banister, MD       methylPREDNISolone acetate (DEPO-MEDROL) injection 80 mg  80 mg Intramuscular Once Makenzy Krist, Karie Schwalbe, NP         Review of Systems:   Constitutional: No weight loss or gain, night sweats, fevers, chills, fatigue, or lassitude. HEENT: No headaches, difficulty swallowing, tooth/dental problems, or sore throat. No sneezing, itching, ear ache, nasal congestion, or post nasal drip CV:  +swelling in bilateral lower extremities. No chest pain, orthopnea, PND, anasarca, dizziness, palpitations, syncope Resp: +shortness of breath with exertion (improving). No excess mucus or change in color of mucus. No productive or non-productive. No hemoptysis. No wheezing.  No chest wall deformity GI:  No heartburn, indigestion, abdominal pain, nausea, vomiting,  diarrhea, change in bowel habits,  loss of appetite, bloody stools.  GU: No dysuria, change in color of urine, urgency or frequency.  No flank pain, no hematuria  Skin: No rash, lesions, ulcerations MSK:  No joint pain or swelling.  No decreased range of motion.  No back pain. Neuro: No dizziness or lightheadedness.  Psych: No depression or anxiety. Mood stable.     Physical Exam:  BP 130/78 (BP Location: Left Arm, Patient Position: Sitting, Cuff Size: Normal)    Pulse 90    Temp 98.2 F (36.8 C) (Oral)    Ht 5\' 10"  (1.778 m)    Wt 188 lb 12.8 oz (85.6 kg)    SpO2 98%    BMI 27.09 kg/m   GEN: Pleasant, interactive, well-appearing; in no acute distress. HEENT:  Normocephalic and atraumatic. EACs patent bilaterally. TM pearly gray with present light reflex bilaterally. PERRLA. Sclera white. Nasal turbinates pink, moist and patent bilaterally. No rhinorrhea present. Oropharynx pink and moist, without exudate or edema. No lesions, ulcerations, or postnasal drip.  NECK:  Supple w/ fair ROM. No JVD present. Normal carotid impulses w/o bruits. Thyroid symmetrical with no goiter or nodules palpated. No lymphadenopathy.   CV: RRR, no m/r/g, +1 pitting edema BLE. Pulses intact, +2 bilaterally. No cyanosis, pallor or clubbing. PULMONARY:  Unlabored, regular breathing. Minimal scattered end expiratory wheezes bilaterally A&P. No accessory muscle use. No dullness to percussion. GI: BS present and normoactive. Soft, non-tender to palpation. No organomegaly or masses detected. No CVA tenderness. MSK: No erythema, warmth or tenderness. Cap refil <2 sec all extrem. No deformities or joint swelling noted.  Neuro: A/Ox3. No focal deficits noted.   Skin: Warm, no lesions or rashe Psych: Normal affect and behavior. Judgement and thought content appropriate.     Lab Results:  CBC    Component Value Date/Time   WBC 7.2 03/14/2021 1226   WBC 8.6 07/31/2018 1438   RBC 4.26 03/14/2021 1226   RBC 4.22  07/31/2018 1438   HGB 12.9 (L) 03/14/2021 1226   HCT 39.2 03/14/2021 1226   PLT 227 03/14/2021 1226   MCV 92 03/14/2021 1226   MCH 30.3 03/14/2021 1226   MCH 21.3 (L) 06/05/2016 1055   MCHC 32.9 03/14/2021 1226   MCHC 33.1 07/31/2018 1438   RDW 14.1 03/14/2021 1226   LYMPHSABS 2.0 07/31/2018 1438   MONOABS 1.1 (H) 07/31/2018 1438   EOSABS 0.2 07/31/2018 1438   BASOSABS 0.1 07/31/2018 1438    BMET    Component Value Date/Time   NA 140 03/14/2021 1226   K 4.3 03/14/2021 1226   CL 103 03/14/2021 1226   CO2 24 03/14/2021 1226   GLUCOSE 156 (H) 03/14/2021 1226   GLUCOSE 140 (H) 06/05/2016 1055   BUN 17 03/14/2021 1226   CREATININE 1.30 (H) 03/14/2021 1226   CREATININE 1.26 (H) 06/05/2016 1055   CALCIUM 8.9 03/14/2021 1226   GFRNONAA 60 06/03/2020 1216   GFRAA 69 06/03/2020 1216    BNP No results found for: BNP   Imaging:  DG Chest 2 View  Result Date: 09/26/2021 CLINICAL DATA:  Shortness of breath, productive cough EXAM: CHEST - 2 VIEW COMPARISON:  08/29/2021 FINDINGS: Normal heart size post CABG. Mediastinal contours and pulmonary vascularity normal. Lungs clear. No pulmonary infiltrate, pleural effusion, or pneumothorax. Osseous structures unremarkable. IMPRESSION: Post CABG. No acute abnormalities. Electronically Signed   By: Lavonia Dana M.D.   On: 09/26/2021 12:59      PFT Results Latest Ref Rng & Units 08/24/2020 04/07/2014  FVC-Pre L 1.93 2.49  FVC-Predicted Pre % 46 57  FVC-Post L 2.30 2.84  FVC-Predicted Post % 55 65  Pre FEV1/FVC % % 56 51  Post FEV1/FCV % % 55 56  FEV1-Pre L 1.09 1.28  FEV1-Predicted Pre % 35 39  FEV1-Post L 1.26 1.60  DLCO uncorrected ml/min/mmHg 14.42 14.73  DLCO UNC% % 58 47  DLCO corrected ml/min/mmHg 14.42 -  DLCO COR %Predicted % 58 -  DLVA Predicted % 95 57  TLC L 6.52 6.10  TLC % Predicted % 95 89  RV % Predicted % 138 145    Lab Results  Component Value Date   NITRICOXIDE 6 05/09/2016        Assessment & Plan:    GOLD COPD C Slowly resolving AECOPD. Discussed that he likely has some deconditioning related to his hospitalization and pna in October and then a URI in January. Cough has resolved. Improving activity intolerance. Will extend out prednisone taper for two more weeks. Continue triple therapy regimen. Does have some reversibility on his previous PFTs - FeNO nl today. No evidence of superimposed infection on recent CXR.   Patient Instructions  -Continue Albuterol inhaler 2 puffs or 3 mL neb every 6 hours as needed for shortness of breath or wheezing. Notify if symptoms persist despite rescue inhaler/neb use. -Continue Nexium 20 Twice daily  -Continue flonase 1 spray each nostril daily -Continue Symbicort 2 puffs Twice daily. Brush tongue and rinse mouth afterwards -Continue Spiriva 1 puff daily -Continue lasix 20 mg every other day  -Continue claritin 10 mg daily for allergies  -Can continue mucinex DM Twice daily as needed for chest congestion or cough -Decrease prednisone to 10 mg tomorrow. Continue prednisone 10 mg daily for an additional week then decrease to 5 mg a day for a week then stop.  Notify if worsening breathlessness, cough, mucus production, fatigue, or wheezing occurs.  Maintain up to date vaccinations, including influenza, COVID, and pneumococcal.  Exercise, as tolerated. Notify if worsening symptoms upon exertion occur.   Follow up with cardiology regarding swelling in your legs if it does not improve once off prednisone    Follow up in one month with [name]. If symptoms do not improve or worsen, please contact office for sooner follow up or seek emergency care.    Lower extremity edema Increased BLE edema. Previously treated with increased lasix dose - pt has remained on daily dosing 20 mg since. BNP was nl in February. Pt reported that he usually has increased swelling with prednisone. Advised to notify if swelling does not resolve after coming off prednisone or gets  any worse. Follow up with cardiology as scheduled. TED hose advised.   GASTROESOPHAGEAL REFLUX DISEASE Well-controlled on PPI.   Rhinitis Well-controlled on current regimen. Cough has resolved.    I spent 35 minutes of dedicated to the care of this patient on the date of this encounter to include pre-visit review of records, face-to-face time with the patient discussing conditions above, post visit ordering of testing, clinical documentation with the electronic health record, making appropriate referrals as documented, and communicating necessary findings to members of the patients care team.  Clayton Bibles, NP 10/06/2021  Pt aware and understands NP's role.

## 2021-10-06 NOTE — Patient Instructions (Addendum)
-  Continue Albuterol inhaler 2 puffs or 3 mL neb every 6 hours as needed for shortness of breath or wheezing. Notify if symptoms persist despite rescue inhaler/neb use. ?-Continue Nexium 20 Twice daily  ?-Continue flonase 1 spray each nostril daily ?-Continue Symbicort 2 puffs Twice daily. Brush tongue and rinse mouth afterwards ?-Continue Spiriva 1 puff daily ?-Continue lasix 20 mg every other day  ?-Continue claritin 10 mg daily for allergies  ?-Can continue mucinex DM Twice daily as needed for chest congestion or cough ?-Decrease prednisone to 10 mg tomorrow. Continue prednisone 10 mg daily for an additional week then decrease to 5 mg a day for a week then stop. ? ?Notify if worsening breathlessness, cough, mucus production, fatigue, or wheezing occurs.  ?Maintain up to date vaccinations, including influenza, COVID, and pneumococcal.  ?Exercise, as tolerated. Notify if worsening symptoms upon exertion occur.  ? ?Follow up with cardiology regarding swelling in your legs if it does not improve once off prednisone  ?  ?Follow up in one month with [name]. If symptoms do not improve or worsen, please contact office for sooner follow up or seek emergency care. ?

## 2021-10-06 NOTE — Assessment & Plan Note (Signed)
Increased BLE edema. Previously treated with increased lasix dose - pt has remained on daily dosing 20 mg since. BNP was nl in February. Pt reported that he usually has increased swelling with prednisone. Advised to notify if swelling does not resolve after coming off prednisone or gets any worse. Follow up with cardiology as scheduled. TED hose advised.  ?

## 2021-10-06 NOTE — Assessment & Plan Note (Signed)
Well-controlled on PPI. ?

## 2021-10-06 NOTE — Assessment & Plan Note (Signed)
Well-controlled on current regimen. Cough has resolved.  ?

## 2021-11-07 ENCOUNTER — Encounter: Payer: Self-pay | Admitting: Emergency Medicine

## 2021-11-07 ENCOUNTER — Ambulatory Visit (INDEPENDENT_AMBULATORY_CARE_PROVIDER_SITE_OTHER): Payer: Medicare Other | Admitting: Emergency Medicine

## 2021-11-07 DIAGNOSIS — J441 Chronic obstructive pulmonary disease with (acute) exacerbation: Secondary | ICD-10-CM

## 2021-11-07 DIAGNOSIS — K219 Gastro-esophageal reflux disease without esophagitis: Secondary | ICD-10-CM | POA: Diagnosis not present

## 2021-11-07 DIAGNOSIS — J00 Acute nasopharyngitis [common cold]: Secondary | ICD-10-CM | POA: Diagnosis not present

## 2021-11-07 DIAGNOSIS — R600 Localized edema: Secondary | ICD-10-CM | POA: Diagnosis not present

## 2021-11-07 MED ORDER — METHYLPREDNISOLONE ACETATE 80 MG/ML IJ SUSP
80.0000 mg | Freq: Once | INTRAMUSCULAR | Status: AC
  Administered 2021-11-07: 80 mg via INTRAMUSCULAR

## 2021-11-07 NOTE — Addendum Note (Signed)
Addended by: Gavin Potters R on: 11/07/2021 03:17 PM ? ? Modules accepted: Orders ? ?

## 2021-11-07 NOTE — Assessment & Plan Note (Signed)
Please continue your Flonase, Claritin and Mucinex as you have been taking them. ?

## 2021-11-07 NOTE — Assessment & Plan Note (Signed)
There may be a component of volume overload here, multifactorial dyspnea.  He needs to go back to see Dr. Marlou Porch and I have asked him to arrange to do this.  Short-term I will ask him to take his Lasix daily for 5 days, then go back to every other day.  He does have significant lower extremity edema ?

## 2021-11-07 NOTE — Patient Instructions (Addendum)
Depo-Medrol injection today ?Please complete prednisone taper until completely gone. ?Continue Spiriva and Symbicort as you have been taking them. ?Keep albuterol available to use either 1 nebulizer treatment or 2 puffs up to every 4 hours if needed for shortness of breath, chest tightness, wheezing. ?Please continue your Flonase, Claritin and Mucinex as you have been taking them. ?Continue your Nexium as you have been taking it. ?Increase your Lasix to every day for 5 days.  Then go back to every other day. ?We will perform walking oximetry today. ?Follow [name] in 1 month or next available.  At that time we will review your symptoms and decide whether you need to be on prednisone every day. ?

## 2021-11-07 NOTE — Assessment & Plan Note (Signed)
Difficult to determine whether his symptoms are acute or subacute.  He started a prednisone taper on his own and we will complete it.  He also asked for Depo-Medrol today.  I think he may need maintenance prednisone dosing.  I will decide this at his next visit.  We will rule out occult hypoxemia with ambulatory oximetry today.  Continue Symbicort and Spiriva, albuterol as needed.  Treat his GERD and rhinitis as able. ?

## 2021-11-07 NOTE — Progress Notes (Signed)
HPI: ? ?ROV 06/13/21 -- 76 yo man, hx severe COPD. Last seen by me in 12/2019.  He is hospitalized was hospitalized at [facility] in September for a community-acquired pneumonia and an acute exacerbation of his COPD. He is off prednisone now - was treated treated again in October w pred for persistent sx of wheeze, dyspnea. He notes that he has been having sweats in the am.  He is having some dyspnea in the am when he gets up, with exertion / chores. He is not back to an exercise routine. He was not sent home with oxygen. Very little cough.  ?PMH: CAD/CABG, hypertension, allergic rhinitis, GERD. Uses albuterol about , which is more than he used to need it ?Today he reports.  He is currently managed on Spiriva and Symbicort. Has a sore throat from Trelegy, stopped.  ?Is on Nexium twice daily. Flu shot up to date. Needs the new booster.  ? ?Chest x-ray performed today and reviewed by me shows hyperinflation, large-scale resolution of some right basilar infiltrate.  No effusion. ? ?ROV 11/07/21 --Oscar Robinson returns today for follow-up.  He is 76 and has a history of severe COPD with associated.  Has been exacerbating recently, has been treated with antibiotics, steroids several times since last time I have seen him, last was 1 month ago.  Currently managed on Symbicort, Spiriva. Uses albuterol about 3x a day, helps his SOB.  ?Nexium, Flonase, Claritin, Mucinex DM, Lasix every other day ?He was on a cruise, returned last week, had increased dyspnea and believed that he was beginning to flare. Began to cough up gray mucous. He started prednisone on his own that he had at home. He has been tapering, is on the pred right now. Unsure that it is helping him - may be slightly better. He is back to the gym, walks on treadmill. He has swelling, is on lasix qod ? ? ?EXAM:  ?Vitals:  ? 11/07/21 1440  ?BP: 136/78  ?Pulse: 94  ?Temp: 98.2 ?F (36.8 ?C)  ?TempSrc: Oral  ?SpO2: 96%  ?Weight: 190 lb 9.6 oz (86.5 kg)  ?Height: '5\' 8"'$  (1.727 m)   ? ? ?Gen: Pleasant, well-nourished, in no distress,  normal affect ? ?ENT: No lesions,  mouth clear,  oropharynx clear, no postnasal drip ? ?Neck: No JVD, expiratory stridor ? ?Lungs: No use of accessory muscles, soft bilateral expiratory wheezes and referred upper airway noise ? ?Cardiovascular: RRR, heart sounds normal, no murmur or gallops, 2+ LE peripheral edema ? ?Musculoskeletal: No deformities, no cyanosis or clubbing ? ?Neuro: alert, non focal ? ?Skin: Warm, no lesions or rashes ? ? ?GOLD COPD C ?Difficult to determine whether his symptoms are acute or subacute.  He started a prednisone taper on his own and we will complete it.  He also asked for Depo-Medrol today.  I think he may need maintenance prednisone dosing.  I will decide this at his next visit.  We will rule out occult hypoxemia with ambulatory oximetry today.  Continue Symbicort and Spiriva, albuterol as needed.  Treat his GERD and rhinitis as able. ? ?Rhinitis ?Please continue your Flonase, Claritin and Mucinex as you have been taking them. ? ?GASTROESOPHAGEAL REFLUX DISEASE ?Nexium as ordered ? ?Lower extremity edema ?There may be a component of volume overload here, multifactorial dyspnea.  He needs to go back to see Dr. Marlou Porch and I have asked him to arrange to do this.  Short-term I will ask him to take his Lasix daily for 5 days, then  go back to every other day.  He does have significant lower extremity edema ? ? ?Time spent 41 minutes ? ?Baltazar Apo, MD, PhD ?11/07/2021, 3:06 PM ?[facility] ?[phone] or if no answer [phone] ? ? ? ? ?

## 2021-11-07 NOTE — Assessment & Plan Note (Signed)
Nexium as ordered 

## 2021-11-08 ENCOUNTER — Telehealth: Payer: Self-pay | Admitting: Emergency Medicine

## 2021-11-08 MED ORDER — PREDNISONE 10 MG PO TABS: ORAL_TABLET | ORAL | 0 refills | Status: DC

## 2021-11-08 NOTE — Telephone Encounter (Signed)
Ok to call pred > Take '40mg'$  daily for 3 days, then '30mg'$  daily for 3 days, then '20mg'$  daily for 3 days, then '10mg'$  daily for 3 days, then stop ? ?

## 2021-11-08 NOTE — Telephone Encounter (Signed)
Called and spoke with patient. He verbalized understanding. I have sent the prednisone to his pharmacy.  ? ?Nothing further needed at time of call.  ?

## 2021-11-08 NOTE — Telephone Encounter (Signed)
Called and spoke with patient. He stated that [facility] in [place] does not a RX for the prednisone. He has finished the prednisone taper that Katie sent in on 09/26/21. He was under the impression that RB was sending in another taper for him.  ? ?Pharmacy is [facility] in [place].  ? ?RB, can you please advise? Thanks!  ?

## 2021-11-15 ENCOUNTER — Telehealth: Payer: Self-pay | Admitting: Cardiology

## 2021-11-15 NOTE — Telephone Encounter (Signed)
Spoke with patient. Feet ankles swelling for past couple of months. This is the worst that he has seen.   He has been taking furosemide 40 mg daily for past 5 days and there is no improvement.  In the past he has taken lasix for a couple days and swelling would go away and now its not.   Discussed breathing - "it is not good" had pna in Oct, has COPD, but it is not acutely worse and he feels like he is fine to wait until Friday when he is scheduled to be seen. ?

## 2021-11-15 NOTE — Telephone Encounter (Signed)
Pt c/o swelling: STAT is pt has developed SOB within 24 hours ? ?If swelling, where is the swelling located? Feet and ankles ? ?How much weight have you gained and in what time span? Not any that he knows of ? ?Have you gained 3 pounds in a day or 5 pounds in a week?  ? ?Do you have a log of your daily weights (if so, list)?  ? ?Are you currently taking a fluid pill? yes ? ?Are you currently SOB? Yes, not at this time- he also have COPD ? ?Have you traveled recently? No-- patient wanted appointment asap- I made him one for Friday with Urban Gibson- please call to evaluate ? ?

## 2021-11-16 DIAGNOSIS — R234 Changes in skin texture: Secondary | ICD-10-CM | POA: Diagnosis not present

## 2021-11-16 DIAGNOSIS — R600 Localized edema: Secondary | ICD-10-CM | POA: Diagnosis not present

## 2021-11-16 DIAGNOSIS — E119 Type 2 diabetes mellitus without complications: Secondary | ICD-10-CM | POA: Diagnosis not present

## 2021-11-16 NOTE — Progress Notes (Signed)
? ? ?Office Visit  ?  ?Patient Name: Oscar Robinson ?Date of Encounter: 11/17/2021 ? ?Primary Care Provider:  [name], MD ?Primary Cardiologist:  Candee Furbish, MD ?Primary Electrophysiologist: None ?Chief Complaint  ?  ?Follow-up for increased lower extremity swelling ? ? Patient Profile: ?COPD (followed by 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 ? Recent Studies: ?10/21 TTE:EF 60 to 65%.  G1 DD, no RWMA exam ?10/21 Myoview: EF 62, normal perfusion; low risk  ?1/23 CXR 2 view: Normal heart size and vasculature. ? ?History of Present Illness  ?  ?Oscar Robinson is a 76 y.o. male with PMH of COPD, GERD, HLD, DM, CKD stage II, HTN, OSA, CAD s/p CABG 1999, LIMA to LAD. LHC 02/2018 that showed patent graft with no change since last evaluation.  Last echo completed 10/21 showed EF 60 to 65%.  G1 DD, no RWMA exam.  Patient was seen in follow-up on 8/22 with complaints of DOE that seem to be worse in the middle of the day. His BP was also slightly elevated and complaint of lower extremity edema.  He  was instructed to increase as needed Lasix to 1 to 2 tablets/day. And was referred to pulmonary rehab ? ?Oscar Robinson was last seen by Dr. Marlou Porch on 11/22 for follow-up.  Patient had recently contracted pneumonia and sepsis after planting crops. He was also reporting increased swelling in his legs even and lieu of taking extra Lasix.  He is also reported increased fatigue and inability to complete exercises. No medication changes were made at that time.  He was also recently seen by pulmonary on 3/23. During visit patient stated that cough had resolved and is continuing to use Symbicort and Spiriva. Patient usually experiences increased swelling when on prednisone. He was seen by [name] on 11/07/2021 with continued complaints of dyspnea and lower extremity edema. He was advised to take prednisone for 5 days and then switch back to every  other day. ? ?Since last being seen Oscar Robinson reports doing fine but has recently had congestion after taking short cruise. He has also noticed increased swelling in his lower extremities that has continued for the last few months. Per [name] he increased his Lasix for 5 days but had no response to that therapy.  He admits some indiscretions with salt but states that he tries his best to abstain from adding salt in his diet.  He is also keeping his fluids under 64 ounces per day.  Today he denies chest pain, palpitations, dyspnea, PND, orthopnea, nausea, vomiting, dizziness, syncope, edema, weight gain, or early satiety. ? ?Past Medical History  ?  ?Past Medical History:  ?Diagnosis Date  ? Anemia   ? Anxiety   ? Arthritis   ? CKD (chronic kidney disease), stage II   ? COPD (chronic obstructive pulmonary disease) ([place])   ? Coronary artery disease   ? a. s/p CABG 1999.  ? Depression   ? Diabetes mellitus   ? Diverticulosis   ? GERD (gastroesophageal reflux disease)   ? Hiatal hernia   ? History of echocardiogram   ? Echocardiogram 10/21: EF 60-65, no RWMA, Gr 1 DD, normal RVSF, RVSP 15.2   ? History of nuclear stress test   ? Myoview 10/21: EF 62, normal perfusion; low risk   ? Hypercholesteremia   ? Hypertension   ? Sleep apnea   ? had sleep  study and negative per pt  ? Thyroid disease   ? Tobacco abuse   ? Tubular adenoma of colon   ? ?Past Surgical History:  ?Procedure Laterality Date  ? COLONOSCOPY    ? CORONARY ARTERY BYPASS GRAFT  1999  ? 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 Procedure: DUPUYTREN CONTRACTURE RELEASE;  Surgeon: Cammie Sickle., MD;  Location: [facility];  Service: Orthopedics;  Laterality: Right;  palm, ring and small fingers dupuytrens contracture release  ? HAND SURGERY    ? lt palm,ring finger,  ? HAND SURGERY Left   ? pinky finger  ? HERNIA REPAIR  1996  ? lt/rt ing hernia  ? LEFT HEART CATH AND CORS/GRAFTS ANGIOGRAPHY N/A 02/26/2018  ? Procedure: LEFT HEART CATH  AND CORS/GRAFTS ANGIOGRAPHY;  Surgeon: Jettie Booze, MD;  Location: [facility];  Service: Cardiovascular;  Laterality: N/A;  ? LEFT HEART CATHETERIZATION WITH CORONARY/GRAFT ANGIOGRAM N/A 06/15/2013  ? Procedure: LEFT HEART CATHETERIZATION WITH Beatrix Fetters;  Surgeon: Candee Furbish, MD;  Location: [facility] C[facility];  Service: Cardiovascular;  Laterality: N/A;  ? UPPER GASTROINTESTINAL ENDOSCOPY    ? ?Allergies ? ?No Known Allergies ? ?Home Medications  ?  ?Current Outpatient Medications  ?Medication Sig Dispense Refill  ? albuterol (PROAIR HFA) 108 (90 Base) MCG/ACT inhaler Inhale 2 puffs into the lungs every 4 (four) hours as needed for wheezing or shortness of breath. 1 each 3  ? albuterol (PROVENTIL) (2.5 MG/3ML) 0.083% nebulizer solution Take 3 mLs (2.5 mg total) by nebulization every 6 (six) hours as needed for wheezing or shortness of breath. 360 mL 5  ? ALPRAZolam (XANAX) 0.5 MG tablet Take 0.5 mg by mouth at bedtime.     ? antiseptic oral rinse (BIOTENE) LIQD 15 mLs by Mouth Rinse route daily.    ? aspirin 81 MG tablet Take 81 mg by mouth at bedtime.     ? benzonatate (TESSALON) 200 MG capsule Take 1 capsule (200 mg total) by mouth 3 (three) times daily as needed for cough. 30 capsule 1  ? Coenzyme Q10 (CO Q 10) 100 MG CAPS Take 100 mg by mouth at bedtime.     ? colesevelam (WELCHOL) 625 MG tablet TAKE 3 TABLETS BY MOUTH TWICE DAILY WITH MEALS 540 tablet 2  ? dextromethorphan-guaiFENesin (MUCINEX DM) 30-600 MG 12hr tablet Take 1 tablet by mouth 2 (two) times daily. 60 tablet 0  ? doxepin (SINEQUAN) 50 MG capsule Take 100 mg by mouth at bedtime.     ? esomeprazole (NEXIUM) 20 MG capsule Take 20 mg by mouth 2 (two) times daily.    ? fluticasone (FLONASE) 50 MCG/ACT nasal spray Place 1 spray into both nostrils daily. 16 g 0  ? HUMALOG 100 UNIT/ML injection 80 Units.   6  ? hydrochlorothiazide (HYDRODIURIL) 25 MG tablet Take 1 tablet by mouth once daily 90 tablet 3  ? levothyroxine  (SYNTHROID) 112 MCG tablet Take 1 tablet by mouth daily before breakfast.    ? loratadine (CLARITIN) 10 MG tablet Take 1 tablet (10 mg total) by mouth daily. 30 tablet 2  ? losartan (COZAAR) 100 MG tablet Take 1 tablet by mouth once daily 90 tablet 3  ? metFORMIN (GLUCOPHAGE) 500 MG tablet Take 1 tablet (500 mg total) by mouth 2 (two) times daily with a meal.    ? metoprolol succinate (TOPROL-XL) 50 MG 24 hr tablet TAKE 1 TABLET BY MOUTH ONCE DAILY WITH  OR  IMMEDIATELY  FOLLOWING  A  MEAL 90 tablet 3  ? Multiple Vitamins-Minerals (CENTRUM SILVER 50+MEN) TABS Take 1 tablet by mouth daily.    ? nitroGLYCERIN (NITROSTAT) 0.4 MG SL tablet Place 1 tablet (0.4 mg total) under the tongue every 5 (five) minutes as needed for chest pain. 25 tablet 3  ? Omega-3 Fatty Acids (FISH OIL) 600 MG CAPS Take 1,400 mg by mouth at bedtime.     ? pravastatin (PRAVACHOL) 40 MG tablet Take 1 tablet by mouth once daily 90 tablet 3  ? predniSONE (DELTASONE) 10 MG tablet Take 4 tabs by mouth for 3 days, then 3 for 3 days, 2 for 3 days, 1 for 3 days and stop 30 tablet 0  ? SYMBICORT 160-4.5 MCG/ACT inhaler Inhale 2 puffs into the lungs 2 (two) times daily. 11 g 2  ? Tiotropium Bromide Monohydrate (SPIRIVA RESPIMAT) 2.5 MCG/ACT AERS INHALE 2 SPRAY(S) BY MOUTH ONCE DAILY 4 g 5  ? furosemide (LASIX) 40 MG tablet Take 2 tabs for 3 days then take as needed 30 tablet 0  ? tamsulosin (FLOMAX) 0.4 MG CAPS capsule SMARTSIG:1 Capsule(s) By Mouth Every Evening    ? ?Current Facility-Administered Medications  ?Medication Dose Route Frequency Provider Last Rate Last Admin  ? 0.9 %  sodium chloride infusion  500 mL Intravenous Continuous Milus Banister, MD      ? methylPREDNISolone acetate (DEPO-MEDROL) injection 80 mg  80 mg Intramuscular Once Cobb, Karie Schwalbe, NP      ?  ? ?Review of Systems  ?Please see the history of present illness.    ?(+) Bilateral lower extremity swellin ? ?All other systems reviewed and are otherwise negative except as noted  above. ? ?Physical Exam  ?  ?Wt Readings from Last 3 Encounters:  ?11/17/21 190 lb 8 oz (86.4 kg)  ?11/07/21 190 lb 9.6 oz (86.5 kg)  ?10/06/21 188 lb 12.8 oz (85.6 kg)  ? ?VS: ?Vitals:  ? 11/17/21 1317  ?BP: 132/70

## 2021-11-17 ENCOUNTER — Encounter (HOSPITAL_BASED_OUTPATIENT_CLINIC_OR_DEPARTMENT_OTHER): Payer: Self-pay | Admitting: Nurse Practitioner

## 2021-11-17 ENCOUNTER — Ambulatory Visit (INDEPENDENT_AMBULATORY_CARE_PROVIDER_SITE_OTHER): Payer: Medicare Other | Admitting: Nurse Practitioner

## 2021-11-17 VITALS — BP 132/70 | HR 77 | Ht 68.0 in | Wt 190.5 lb

## 2021-11-17 DIAGNOSIS — R600 Localized edema: Secondary | ICD-10-CM

## 2021-11-17 DIAGNOSIS — I2510 Atherosclerotic heart disease of native coronary artery without angina pectoris: Secondary | ICD-10-CM

## 2021-11-17 DIAGNOSIS — I25709 Atherosclerosis of coronary artery bypass graft(s), unspecified, with unspecified angina pectoris: Secondary | ICD-10-CM | POA: Diagnosis not present

## 2021-11-17 MED ORDER — FUROSEMIDE 40 MG PO TABS: ORAL_TABLET | ORAL | 0 refills | Status: DC

## 2021-11-17 NOTE — Patient Instructions (Addendum)
Medication Instructions:  ?Your physician has recommended you make the following change in your medication:  ? ?CHANGE Furosemide to 2 tablets ('80mg'$ ) daily for 3 days ? ?Then return to as needed. ? ?Call us Monday with a report of how your swelling is doing.  ? ?*If you need a refill on your cardiac medications before your next appointment, please call your pharmacy* ? ? ?Lab Work: ?Your physician recommends that you return for lab work in 1 week for BMP. ? ?If you have labs (blood work) drawn today and your tests are completely normal, you will receive your results only by: ?MyChart Message (if you have MyChart) OR ?A paper copy in the mail ?If you have any lab test that is abnormal or we need to change your treatment, we will call you to review the results. ? ?Testing/Procedures: ?None ordered today.  ? ?Follow-Up: ?At [facility], you and your health needs are our priority.  As part of our continuing mission to provide you with exceptional heart care, we have created designated Provider Care Teams.  These Care Teams include your primary Cardiologist (physician) and Advanced Practice Providers (APPs -  Physician Assistants and Nurse Practitioners) who all work together to provide you with the care you need, when you need it. ? ?We recommend signing up for the patient portal called "MyChart".  Sign up information is provided on this After Visit Summary.  MyChart is used to connect with patients for Virtual Visits (Telemedicine).  Patients are able to view lab/test results, encounter notes, upcoming appointments, etc.  Non-urgent messages can be sent to your provider as well.   ?To learn more about what you can do with MyChart, go to [email or web address].   ? ?Your next appointment:   ?As scheduled with Dr. Marlou Porch ? ?Other Instructions ? ?To prevent or reduce lower extremity swelling: ?Eat a low salt diet. Salt makes the body hold onto extra fluid which causes swelling. ?Sit with legs elevated. For example, in  the recliner or on an [place].  ?Wear knee-high compression stockings during the daytime. Ones labeled 15-20 mmHg provide good compression. ? ?Recommend weighing daily and keeping a log. Please call our office if you have weight gain of 2 pounds overnight or 5 pounds in 1 week.  ? ?Date ? Time Weight  ? ?    ? ?    ? ?    ? ?    ? ?    ? ?    ? ?    ? ?    ? ? ?Important Information About Sugar ? ? ? ? ?  ?

## 2021-11-20 ENCOUNTER — Telehealth: Payer: Self-pay | Admitting: Nurse Practitioner

## 2021-11-20 MED ORDER — FUROSEMIDE 40 MG PO TABS: ORAL_TABLET | ORAL | 0 refills | Status: DC

## 2021-11-20 NOTE — Telephone Encounter (Signed)
Pt was seen 11/17/21 and advised to take Lasix 40 mg 2 po qd x3 days... he was  urinating quite a bit this past Saturday but it has slowed down since then... he says he has been a little light headed with rising and his lower extremity edema has improved.  ? ?His weights:  ? ?WT since being started on Lasix. WT: 04/15 184 lbs, 04/16 182 lbs, 04/17 181 lbs Took Lasix earlier today.  ? ?Pt has labs planned for this Friday and appt with Dr. Marlou Porch 12/07/21.  ? ?Will forward to Elvin So NP for his review and recommendations of his Lasix dosing for tomorrow.  ? ?Pt does not have any BP readings to report. He has a cuff but says he will have to find it and will let us know if able to check it.  ?

## 2021-11-20 NOTE — Telephone Encounter (Signed)
Please advise Mr. Grigoryan to hold his Lasix dose for tomorrow. Please advise him to take 40 mg twice daily if he notices weight gain of 2 lbs overnight or 5 lbs in one week.  Please make sure that he follows a Low sodium diet, restrict fluids to <2L, and daily weights encouraged. Please let me know if you have any additional questions. ? ?Thanks, ?Ambrose Pancoast, NP ?

## 2021-11-20 NOTE — Addendum Note (Signed)
Addended by: Loel Dubonnet on: 11/20/2021 07:41 AM ? ? Modules accepted: Orders ? ?

## 2021-11-20 NOTE — Telephone Encounter (Signed)
Patient states he was advised to call in today to report his WT since being started on Lasix. WT: 04/15 184 lbs, 04/16 182 lbs, 04/17 181 lbs Took Lasix earlier today.  ?

## 2021-11-20 NOTE — Telephone Encounter (Signed)
Pt advised and verbalized the parameters given and will let us know if he has any problems.  ? ?Will also send to his My Chart for his review.  ?

## 2021-11-24 ENCOUNTER — Other Ambulatory Visit: Payer: Self-pay | Admitting: Family

## 2021-11-24 DIAGNOSIS — R600 Localized edema: Secondary | ICD-10-CM | POA: Diagnosis not present

## 2021-11-25 LAB — BASIC METABOLIC PANEL
BUN/Creatinine Ratio: 15 (ref 12–28)
BUN: 18 mg/dL (ref 8–27)
CO2: 25 mmol/L (ref 20–29)
Calcium: 9.4 mg/dL (ref 8.7–10.3)
Chloride: 98 mmol/L (ref 96–106)
Creatinine, Ser: 1.18 mg/dL — ABNORMAL HIGH (ref 0.57–1.00)
Glucose: 166 mg/dL — ABNORMAL HIGH (ref 70–99)
Potassium: 4.7 mmol/L (ref 3.5–5.2)
Sodium: 139 mmol/L (ref 134–144)
eGFR: 48 mL/min/{1.73_m2} — ABNORMAL LOW (ref >59)

## 2021-11-29 ENCOUNTER — Telehealth (HOSPITAL_BASED_OUTPATIENT_CLINIC_OR_DEPARTMENT_OTHER): Payer: Self-pay

## 2021-11-29 ENCOUNTER — Other Ambulatory Visit: Payer: Self-pay | Admitting: Primary Care

## 2021-11-29 NOTE — Telephone Encounter (Addendum)
Results called to patient who verbalizes understanding!  ? ? ? ?----- Message from Loel Dubonnet, NP sent at 11/26/2021  4:45 PM EDT ----- ?Stable kidney function. Normal electrolytes. Good result! Continue current meds.  ?

## 2021-12-05 DIAGNOSIS — N401 Enlarged prostate with lower urinary tract symptoms: Secondary | ICD-10-CM | POA: Diagnosis not present

## 2021-12-05 DIAGNOSIS — R972 Elevated prostate specific antigen [PSA]: Secondary | ICD-10-CM | POA: Diagnosis not present

## 2021-12-07 ENCOUNTER — Ambulatory Visit (INDEPENDENT_AMBULATORY_CARE_PROVIDER_SITE_OTHER): Payer: Medicare Other | Admitting: Cardiology

## 2021-12-07 ENCOUNTER — Encounter: Payer: Self-pay | Admitting: Cardiology

## 2021-12-07 DIAGNOSIS — J439 Emphysema, unspecified: Secondary | ICD-10-CM | POA: Diagnosis not present

## 2021-12-07 DIAGNOSIS — I25709 Atherosclerosis of coronary artery bypass graft(s), unspecified, with unspecified angina pectoris: Secondary | ICD-10-CM

## 2021-12-07 DIAGNOSIS — I2510 Atherosclerotic heart disease of native coronary artery without angina pectoris: Secondary | ICD-10-CM

## 2021-12-07 DIAGNOSIS — R600 Localized edema: Secondary | ICD-10-CM

## 2021-12-07 NOTE — Assessment & Plan Note (Signed)
Much improved continue with current diuretic dose.  He knows he can take an extra diuretic dose if necessary, I will 3 to 5 pounds. ?

## 2021-12-07 NOTE — Assessment & Plan Note (Signed)
Currently stable.  No anginal symptoms.  No change in medication management. ?

## 2021-12-07 NOTE — Patient Instructions (Signed)
Medication Instructions:  ?The current medical regimen is effective;  continue present plan and medications. ? ?*If you need a refill on your cardiac medications before your next appointment, please call your pharmacy* ? ?Follow-Up: ?At [facility], you and your health needs are our priority.  As part of our continuing mission to provide you with exceptional heart care, we have created designated Provider Care Teams.  These Care Teams include your primary Cardiologist (physician) and Advanced Practice Providers (APPs -  Physician Assistants and Nurse Practitioners) who all work together to provide you with the care you need, when you need it. ? ?We recommend signing up for the patient portal called "MyChart".  Sign up information is provided on this After Visit Summary.  MyChart is used to connect with patients for Virtual Visits (Telemedicine).  Patients are able to view lab/test results, encounter notes, upcoming appointments, etc.  Non-urgent messages can be sent to your provider as well.   ?To learn more about what you can do with MyChart, go to [email or web address].   ? ?Your next appointment:   ?6 month(s) ? ?The format for your next appointment:   ?In Person ? ?Provider:   ?Robbie Lis, PA-C, Nicholes Rough, PA-C, Dayna Dunn, PA-C, Ermalinda Barrios, PA-C, Christen Bame, NP, or Richardson Dopp, PA-C     { ? ?Thank you for choosing [facility]!! ? ? ? ?Important Information About Sugar ? ? ? ? ?  ?

## 2021-12-07 NOTE — Progress Notes (Signed)
?Cardiology Office Note:   ? ?Date:  12/07/2021  ? ?ID:  Oscar Robinson, DOB [birth year], MRN [id number] ? ?PCP:  [name], MD  ?[place] Cardiologist:  Candee Furbish, MD  ?[facility] Electrophysiologist:  None  ? ?Referring MD: [name], MD  ? ? ?History of Present Illness:   ? ?Oscar Robinson is a 76 y.o. male here to follow up for coronary artery disease, hypertension, and  ?Lower extremity edema.  Several months ago while planting crops, kicking up dust and dirt in late September he ended up developing pneumonia and got quite sick.  He is now over this but it is hard for him to keep his stamina up. ? ?CABG 1999-LIMA to LAD ? ?COPD followed by pulmonary clinic, [name] ? ?Lower extremity edema left greater than right in general. ? ?Has been short of breath.  Encouraged exercise.  States that he has been using the treadmill for 20 years.  Continue to exercise. ? 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  He also just bought another plot of land next to his.  He has approximately 270 acres in total in [place].  His son helps him.  His son competed in the world championship skeet shooting ? 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 ? ?He did ask for potential name of psychiatry at prior visit.  Gave him the name Dr. Toy Care ? ? Recent Studies: ?10/21 TTE:EF 60 to 65%.  G1 DD, no RWMA exam ?10/21 Myoview: EF 62, normal perfusion; low risk  ?1/23 CXR 2 view: Normal heart size and vasculature. ?  ? ?Past Medical History:  ?Diagnosis Date  ? Anemia   ? Anxiety   ? Arthritis   ? CKD (chronic kidney disease), stage II   ? COPD (chronic obstructive pulmonary disease) ([place])   ? Coronary artery disease   ? a. s/p CABG 1999.  ? Depression   ? Diabetes mellitus   ? Diverticulosis   ? GERD (gastroesophageal reflux disease)   ? Hiatal hernia   ? History of echocardiogram   ? Echocardiogram 10/21: EF 60-65, no RWMA, Gr 1 DD, normal RVSF, RVSP 15.2   ? History of nuclear stress test   ? Myoview 10/21:  EF 62, normal perfusion; low risk   ? Hypercholesteremia   ? Hypertension   ? Sleep apnea   ? had sleep study and negative per pt  ? Thyroid disease   ? Tobacco abuse   ? Tubular adenoma of colon   ? ? ?Past Surgical History:  ?Procedure Laterality Date  ? COLONOSCOPY    ? CORONARY ARTERY BYPASS GRAFT  1999  ? 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 Procedure: DUPUYTREN CONTRACTURE RELEASE;  Surgeon: Cammie Sickle., MD;  Location: [facility];  Service: Orthopedics;  Laterality: Right;  palm, ring and small fingers dupuytrens contracture release  ? HAND SURGERY    ? lt palm,ring finger,  ? HAND SURGERY Left   ? pinky finger  ? HERNIA REPAIR  1996  ? lt/rt ing hernia  ? LEFT HEART CATH AND CORS/GRAFTS ANGIOGRAPHY N/A 02/26/2018  ? Procedure: LEFT HEART CATH AND CORS/GRAFTS ANGIOGRAPHY;  Surgeon: Jettie Booze, MD;  Location: [facility];  Service: Cardiovascular;  Laterality: N/A;  ? LEFT HEART CATHETERIZATION WITH CORONARY/GRAFT ANGIOGRAM N/A 06/15/2013  ? Procedure: LEFT HEART CATHETERIZATION WITH Beatrix Fetters;  Surgeon: Candee Furbish, MD;  Location: [facility] C[facility];  Service: Cardiovascular;  Laterality:  N/A;  ? UPPER GASTROINTESTINAL ENDOSCOPY    ? ? ?Current Medications: ?Current Meds  ?Medication Sig  ? albuterol (PROAIR HFA) 108 (90 Base) MCG/ACT inhaler Inhale 2 puffs into the lungs every 4 (four) hours as needed for wheezing or shortness of breath.  ? albuterol (PROVENTIL) (2.5 MG/3ML) 0.083% nebulizer solution Take 3 mLs (2.5 mg total) by nebulization every 6 (six) hours as needed for wheezing or shortness of breath.  ? ALPRAZolam (XANAX) 0.5 MG tablet Take 0.5 mg by mouth at bedtime.   ? antiseptic oral rinse (BIOTENE) LIQD 15 mLs by Mouth Rinse route daily.  ? aspirin 81 MG tablet Take 81 mg by mouth at bedtime.   ? benzonatate (TESSALON) 200 MG capsule Take 1 capsule (200 mg total) by mouth 3 (three) times daily as needed for cough.  ? Coenzyme Q10 (CO Q 10) 100  MG CAPS Take 100 mg by mouth at bedtime.   ? colesevelam (WELCHOL) 625 MG tablet TAKE 3 TABLETS BY MOUTH TWICE DAILY WITH MEALS  ? dextromethorphan-guaiFENesin (MUCINEX DM) 30-600 MG 12hr tablet Take 1 tablet by mouth 2 (two) times daily.  ? doxepin (SINEQUAN) 50 MG capsule Take 100 mg by mouth at bedtime.   ? esomeprazole (NEXIUM) 20 MG capsule Take 20 mg by mouth 2 (two) times daily.  ? fluticasone (FLONASE) 50 MCG/ACT nasal spray Use 1 spray(s) in each nostril once daily  ? furosemide (LASIX) 40 MG tablet Take one tablet ('40mg'$ ) up to twice a day as needed for weight gain greater than 2 lbs overnight or 5 pounds in a week.  ? HUMALOG 100 UNIT/ML injection 80 Units.   ? hydrochlorothiazide (HYDRODIURIL) 25 MG tablet Take 1 tablet by mouth once daily  ? levothyroxine (SYNTHROID) 112 MCG tablet Take 1 tablet by mouth daily before breakfast.  ? loratadine (CLARITIN) 10 MG tablet Take 1 tablet (10 mg total) by mouth daily.  ? losartan (COZAAR) 100 MG tablet Take 1 tablet by mouth once daily  ? metFORMIN (GLUCOPHAGE) 500 MG tablet Take 1 tablet (500 mg total) by mouth 2 (two) times daily with a meal.  ? metoprolol succinate (TOPROL-XL) 50 MG 24 hr tablet TAKE 1 TABLET BY MOUTH ONCE DAILY WITH  OR  IMMEDIATELY  FOLLOWING  A  MEAL  ? Multiple Vitamins-Minerals (CENTRUM SILVER 50+MEN) TABS Take 1 tablet by mouth daily.  ? nitroGLYCERIN (NITROSTAT) 0.4 MG SL tablet Place 1 tablet (0.4 mg total) under the tongue every 5 (five) minutes as needed for chest pain.  ? Omega-3 Fatty Acids (FISH OIL) 600 MG CAPS Take 1,400 mg by mouth at bedtime.   ? pravastatin (PRAVACHOL) 40 MG tablet Take 1 tablet by mouth once daily  ? predniSONE (DELTASONE) 10 MG tablet Take 4 tabs by mouth for 3 days, then 3 for 3 days, 2 for 3 days, 1 for 3 days and stop  ? SYMBICORT 160-4.5 MCG/ACT inhaler Inhale 2 puffs into the lungs 2 (two) times daily.  ? tamsulosin (FLOMAX) 0.4 MG CAPS capsule SMARTSIG:1 Capsule(s) By Mouth Every Evening  ? Tiotropium  Bromide Monohydrate (SPIRIVA RESPIMAT) 2.5 MCG/ACT AERS INHALE 2 SPRAY(S) BY MOUTH ONCE DAILY  ? ?Current Facility-Administered Medications for the 12/07/21 encounter (Office Visit) with Jerline Pain, MD  ?Medication  ? 0.9 %  sodium chloride infusion  ? methylPREDNISolone acetate (DEPO-MEDROL) injection 80 mg  ?  ? ?Allergies:   Patient has no known allergies.  ? ?Social History  ? ?Socioeconomic History  ? Marital status: Divorced  ?  Spouse name: Not on file  ? Number of children: 1  ? Years of education: Not on file  ? Highest education level: Not on file  ?Occupational History  ? Occupation: Copywriter, advertising  ?Tobacco Use  ? Smoking status: Former  ?  Packs/day: 2.00  ?  Years: 35.00  ?  Pack years: 70.00  ?  Types: Cigarettes  ?  Quit date: 09/27/1997  ?  Years since quitting: 24.2  ? Smokeless tobacco: Never  ?Substance and Sexual Activity  ? Alcohol use: Yes  ?  Comment: occ  ? Drug use: No  ? Sexual activity: Not on file  ?Other Topics Concern  ? Not on file  ?Social History Narrative  ? Not on file  ? ?Social Determinants of Health  ? ?Financial Resource Strain: Not on file  ?Food Insecurity: Not on file  ?Transportation Needs: Not on file  ?Physical Activity: Not on file  ?Stress: Not on file  ?Social Connections: Not on file  ?  ? ?Family History: ?The patient's family history includes Drug abuse in his brother. There is no history of Heart disease, Heart failure, Diabetes, Hypertension, Colon cancer, Esophageal cancer, Prostate cancer, Pancreatic cancer, Kidney disease, Liver disease, Stomach cancer, or Rectal cancer. ? ?ROS:   ?Please see the history of present illness.    ?(+) LE edema ?(+) Shortness of Breath ? All other systems reviewed and are negative. ? ?EKGs/Labs/Other Studies Reviewed:   ? ? ?The following studies were reviewed today: ? ?EKG: EKG is personally reviewed and interpreted.  ? ?06/08/2021: EKG was not ordered today. ? ?Cardiac catheterization 02/26/2018 ?Ost  1st Diag lesion is 75% stenosed. LIMA to diagonal is patent. Appears unchanged from prior cath. ?Prox LAD lesion is 25% stenosed. ?The left ventricular systolic function is normal. ?LV end diastolic press

## 2021-12-07 NOTE — Assessment & Plan Note (Signed)
Continue to work on rehab efforts, exercise.  Pulmonary. ?

## 2021-12-14 DIAGNOSIS — Z9641 Presence of insulin pump (external) (internal): Secondary | ICD-10-CM | POA: Diagnosis not present

## 2021-12-14 DIAGNOSIS — I10 Essential (primary) hypertension: Secondary | ICD-10-CM | POA: Diagnosis not present

## 2021-12-14 DIAGNOSIS — E7800 Pure hypercholesterolemia, unspecified: Secondary | ICD-10-CM | POA: Diagnosis not present

## 2021-12-14 DIAGNOSIS — E049 Nontoxic goiter, unspecified: Secondary | ICD-10-CM | POA: Diagnosis not present

## 2021-12-14 DIAGNOSIS — E1165 Type 2 diabetes mellitus with hyperglycemia: Secondary | ICD-10-CM | POA: Diagnosis not present

## 2021-12-14 DIAGNOSIS — E02 Subclinical iodine-deficiency hypothyroidism: Secondary | ICD-10-CM | POA: Diagnosis not present

## 2021-12-15 DIAGNOSIS — E1165 Type 2 diabetes mellitus with hyperglycemia: Secondary | ICD-10-CM | POA: Diagnosis not present

## 2021-12-15 DIAGNOSIS — E02 Subclinical iodine-deficiency hypothyroidism: Secondary | ICD-10-CM | POA: Diagnosis not present

## 2021-12-15 DIAGNOSIS — E7800 Pure hypercholesterolemia, unspecified: Secondary | ICD-10-CM | POA: Diagnosis not present

## 2021-12-20 ENCOUNTER — Encounter: Payer: Self-pay | Admitting: Emergency Medicine

## 2021-12-20 ENCOUNTER — Ambulatory Visit (INDEPENDENT_AMBULATORY_CARE_PROVIDER_SITE_OTHER): Payer: Medicare Other | Admitting: Emergency Medicine

## 2021-12-20 DIAGNOSIS — I25709 Atherosclerosis of coronary artery bypass graft(s), unspecified, with unspecified angina pectoris: Secondary | ICD-10-CM

## 2021-12-20 DIAGNOSIS — J00 Acute nasopharyngitis [common cold]: Secondary | ICD-10-CM

## 2021-12-20 DIAGNOSIS — I2510 Atherosclerotic heart disease of native coronary artery without angina pectoris: Secondary | ICD-10-CM

## 2021-12-20 DIAGNOSIS — J439 Emphysema, unspecified: Secondary | ICD-10-CM

## 2021-12-20 MED ORDER — PREDNISONE 20 MG PO TABS
20.0000 mg | ORAL_TABLET | Freq: Every day | ORAL | 0 refills | Status: DC
Start: 2021-12-20 — End: 2022-01-23

## 2021-12-20 NOTE — Assessment & Plan Note (Signed)
Continue use your Lasix when you need it for increased lower extremity swelling or weight gain ? ?

## 2021-12-20 NOTE — Patient Instructions (Addendum)
Please continue Symbicort and Spiriva as you have been taking them.  Rinse and gargle after using. ?Keep your albuterol available to use 2 puffs or 1 nebulizer treatment when you needed for shortness of breath, chest tightness, wheezing. ?We will try starting prednisone 20 mg once daily until next visit.  Please keep track of whether this helps your breathing.  If so then we may continue prednisone, decrease the dose to the lowest effective dose.  If you do not benefit then we will stop the medication. ?Continue use your Lasix when you need it for increased lower extremity swelling or weight gain ?Continue your fluticasone nasal spray as you have been taking it ?Try changing your loratadine to 10 mg each evening ?Follow with APP in 1 month ?Follow with [name] in 6 months or sooner if you have any problems. ? ?

## 2021-12-20 NOTE — Assessment & Plan Note (Addendum)
Significant COPD.  Unclear whether he has benefited from steroids, he has difficulty reporting.  For that matter difficult to say whether he benefited much from the extra Lasix that I gave him 6 weeks ago although his lower extremity edema did get better.  We will do a trial of prednisone 20 mg daily for about 1 month.  He will keep track of whether it helps his breathing.  If so we can continue him on chronic prednisone, try to decrease the dose to the lowest effective.  I suspect that he will not notice much difference.  If that is the case then we will stop the prednisone, try to work harder on his exercise and conditioning.  He does not desaturate with exertion. ? ?Please continue Symbicort and Spiriva as you have been taking them.  Rinse and gargle after using. ?Keep your albuterol available to use 2 puffs or 1 nebulizer treatment when you needed for shortness of breath, chest tightness, wheezing. ?We will try starting prednisone 20 mg once daily until next visit.  Please keep track of whether this helps your breathing.  If so then we may continue prednisone, decrease the dose to the lowest effective dose.  If you do not benefit then we will stop the medication. ?Follow with APP in 1 month ?Follow with [name] in 6 months or sooner if you have any problems. ?

## 2021-12-20 NOTE — Assessment & Plan Note (Signed)
Continue your fluticasone nasal spray as you have been taking it ?Try changing your loratadine to 10 mg each evening ?

## 2021-12-20 NOTE — Progress Notes (Signed)
HPI: ? ?ROV 11/07/21 --Oscar Robinson returns today for follow-up.  He is 76 and has a history of severe COPD with associated.  Has been exacerbating recently, has been treated with antibiotics, steroids several times since last time I have seen him, last was 1 month ago.  Currently managed on Symbicort, Spiriva. Uses albuterol about 3x a day, helps his SOB.  ?Nexium, Flonase, Claritin, Mucinex DM, Lasix every other day ?He was on a cruise, returned last week, had increased dyspnea and believed that he was beginning to flare. Began to cough up gray mucous. He started prednisone on his own that he had at home. He has been tapering, is on the pred right now. Unsure that it is helping him - may be slightly better. He is back to the gym, walks on treadmill. He has swelling, is on lasix qod ? ?ROV 12/20/21 --76 year old man with very severe COPD and associated chronic hypoxemic respiratory failure.  He has been experiencing fairly frequent exacerbations, has had prednisone multiple times.  I had treated with Depo-Medrol at his request at our last visit in early April.  He did not desaturate with ambulation at our last visit.  Remains on Symbicort, Spiriva.  Treating GERD and rhinitis aggressively.  He also has bilateral lower extremity edema so I treated him with 5 days of scheduled Lasix to see if you get benefit. ?Today he reports that his swelling did improve when he took the lasix x 5 days, is now back to prn. He does not believe his overall breathing is any better. He uses albuterol about 2-3x a day. He is trying to get back to the gym, walking for 20 min. He is on loratadine and fluticasone nasal spray.  ? ? ?EXAM:  ?Vitals:  ? 12/20/21 1159  ?BP: 122/70  ?Pulse: 72  ?Temp: 98.7 ?F (37.1 ?C)  ?TempSrc: Oral  ?SpO2: 97%  ?Weight: 189 lb 9.6 oz (86 kg)  ?Height: '5\' 9"'$  (1.753 m)  ? ?Gen: Pleasant, well-nourished, in no distress,  normal affect ? ?ENT: No lesions,  mouth clear,  oropharynx clear, no postnasal drip ? ?Neck: No JVD,  no stridor ? ?Lungs: No use of accessory muscles, distant w exp wheeze ? ?Cardiovascular: RRR, heart sounds normal, no murmur or gallops, trace LE peripheral edema, compression hose on ? ?Musculoskeletal: No deformities, no cyanosis or clubbing ? ?Neuro: alert, non focal ? ?Skin: Warm, no lesions or rashes ? ? ?GOLD COPD C ?Significant COPD.  Unclear whether he has benefited from steroids, he has difficulty reporting.  For that matter difficult to say whether he benefited much from the extra Lasix that I gave him 6 weeks ago although his lower extremity edema did get better.  We will do a trial of prednisone 20 mg daily for about 1 month.  He will keep track of whether it helps his breathing.  If so we can continue him on chronic prednisone, try to decrease the dose to the lowest effective.  I suspect that he will not notice much difference.  If that is the case then we will stop the prednisone, try to work harder on his exercise and conditioning.  He does not desaturate with exertion. ? ?Please continue Symbicort and Spiriva as you have been taking them.  Rinse and gargle after using. ?Keep your albuterol available to use 2 puffs or 1 nebulizer treatment when you needed for shortness of breath, chest tightness, wheezing. ?We will try starting prednisone 20 mg once daily until next visit.  Please keep track of whether this helps your breathing.  If so then we may continue prednisone, decrease the dose to the lowest effective dose.  If you do not benefit then we will stop the medication. ?Follow with APP in 1 month ?Follow with [name] in 6 months or sooner if you have any problems. ? ?Coronary artery disease involving coronary bypass graft of native heart with angina pectoris ([place]) ?Continue use your Lasix when you need it for increased lower extremity swelling or weight gain ? ? ?Rhinitis ?Continue your fluticasone nasal spray as you have been taking it ?Try changing your loratadine to 10 mg each  evening ? ? ? ? ?Baltazar Apo, MD, PhD ?12/20/2021, 12:42 PM ?[facility] ?[phone] or if no answer [phone] ? ? ? ? ?

## 2021-12-21 DIAGNOSIS — K134 Granuloma and granuloma-like lesions of oral mucosa: Secondary | ICD-10-CM | POA: Diagnosis not present

## 2021-12-27 ENCOUNTER — Telehealth: Payer: Self-pay | Admitting: *Deleted

## 2021-12-27 NOTE — Telephone Encounter (Signed)
   Pre-operative Risk Assessment    Patient Name: Oscar Robinson  DOB: 10-31-1945 MRN: [id number]      Request for Surgical Clearance    Procedure:   ULTRASOUND GUIDED PROSTATE Bx  DUE TO ELEVATED PSA  Date of Surgery:  Clearance TBD                                 Surgeon:  DR. Joie Bimler Surgeon's Group or Practice Name:  [facility] Phone number:  [phone] Fax number:  [phone]   Type of Clearance Requested:   - Medical ; REQUEST TO HOLD ASA; PER CLEARANCE NOTES STATE: HOLD ASA 81 MG (3 TABLETS) x 7 DAYS PRIOR TO PROCEDURE AND 7 DAYS S/P PROCEDURE   Type of Anesthesia:  Local    Additional requests/questions:    Jiles Prows   12/27/2021, 8:35 AM

## 2021-12-27 NOTE — Telephone Encounter (Signed)
   Primary Cardiologist: Candee Furbish, MD  Chart reviewed as part of pre-operative protocol coverage. Given past medical history and time since last visit, based on ACC/AHA guidelines, Oscar Robinson would be at acceptable risk for the planned procedure without further cardiovascular testing.   His aspirin may be held for 7 days prior to his procedure.  Please resume as soon as hemostasis is achieved.  I will route this recommendation to the requesting party via Epic fax function and remove from pre-op pool.  Please call with questions.  Oscar Robinson. Ramah Langhans NP-C    12/27/2021, 9:47 AM [facility] [place] [address] Office [phone] Fax [phone]

## 2022-01-22 ENCOUNTER — Telehealth: Payer: Self-pay | Admitting: Emergency Medicine

## 2022-01-22 NOTE — Telephone Encounter (Signed)
Called and spoke with patient. He stated that he has been having increased SOB over the past week. The SOB increased over the weekend. He also has been wheezing more. Denied any fevers. Increased fatigue. Denied being around anyone who has been sick recently. He purchased a pulse ox over the weekend and has been monitoring his O2 levels. They have ranged from 89%-92%. He was originally scheduled for an appt with Beth on 06/23 but he didn't want to wait that long.   I was able to get him scheduled to see Katie tomorrow at 11am. He verbalized understanding. I kept the appt for Friday in case Katie wanted to come in a few days for a F/U.  He is aware to call us back or 911 if anything changes or gets worse.   Will route to Katie as a FYI for tomorrow.

## 2022-01-23 ENCOUNTER — Ambulatory Visit (INDEPENDENT_AMBULATORY_CARE_PROVIDER_SITE_OTHER): Payer: Medicare Other

## 2022-01-23 ENCOUNTER — Encounter: Payer: Self-pay | Admitting: Nurse Practitioner

## 2022-01-23 ENCOUNTER — Ambulatory Visit (INDEPENDENT_AMBULATORY_CARE_PROVIDER_SITE_OTHER): Payer: Medicare Other | Admitting: Nurse Practitioner

## 2022-01-23 VITALS — BP 138/70 | HR 94 | Temp 98.2°F | Ht 69.0 in | Wt 195.4 lb

## 2022-01-23 DIAGNOSIS — J449 Chronic obstructive pulmonary disease, unspecified: Secondary | ICD-10-CM

## 2022-01-23 DIAGNOSIS — R600 Localized edema: Secondary | ICD-10-CM

## 2022-01-23 DIAGNOSIS — R0602 Shortness of breath: Secondary | ICD-10-CM

## 2022-01-23 DIAGNOSIS — J439 Emphysema, unspecified: Secondary | ICD-10-CM

## 2022-01-23 LAB — BASIC METABOLIC PANEL
BUN: 22 mg/dL (ref 6–23)
CO2: 30 mEq/L (ref 19–32)
Calcium: 9.6 mg/dL (ref 8.4–10.5)
Chloride: 99 mEq/L (ref 96–112)
Creatinine, Ser: 1.22 mg/dL (ref 0.40–1.50)
GFR: 58.2 mL/min — ABNORMAL LOW (ref >60.00)
Glucose, Bld: 177 mg/dL — ABNORMAL HIGH (ref 70–99)
Potassium: 4 mEq/L (ref 3.5–5.1)
Sodium: 137 mEq/L (ref 135–145)

## 2022-01-23 MED ORDER — BUDESONIDE 0.5 MG/2ML IN SUSP: 0.5000 mg | Freq: Two times a day (BID) | RESPIRATORY_TRACT | 3 refills | Status: DC

## 2022-01-23 MED ORDER — ARFORMOTEROL TARTRATE 15 MCG/2ML IN NEBU: 15.0000 ug | INHALATION_SOLUTION | Freq: Two times a day (BID) | RESPIRATORY_TRACT | 3 refills | Status: DC

## 2022-01-23 MED ORDER — PREDNISONE 10 MG PO TABS: ORAL_TABLET | ORAL | 0 refills | Status: DC

## 2022-01-23 MED ORDER — REVEFENACIN 175 MCG/3ML IN SOLN: 175.0000 ug | Freq: Every day | RESPIRATORY_TRACT | 3 refills | Status: DC

## 2022-01-23 NOTE — Progress Notes (Deleted)
$'@Patient'v$  ID: Oscar Robinson, male    DOB: 1945-09-06, 76 y.o.   MRN: [id number]  Chief Complaint  Patient presents with   Follow-up    Increased sob x 2-3 wks., wheezing, denies cough    Referring provider: [name], MD  HPI:   TEST/EVENTS:   No Known Allergies  Immunization History  Administered Date(s) Administered   Fluad Quad(high Dose 65+) 04/27/2019, 04/26/2020, 05/15/2021   H1N1 07/09/2008   Influenza Split 05/06/2012, 06/21/2015   Influenza Whole 05/23/2010, 05/07/2011   Influenza, High Dose Seasonal PF 05/23/2016, 04/14/2018   Influenza,inj,Quad PF,6+ Mos 05/01/2013, 05/07/2014   Influenza-Unspecified 04/01/2017   PFIZER(Purple Top)SARS-COV-2 Vaccination 09/27/2019, 10/21/2019, 07/04/2020   Pneumococcal Conjugate-13 03/09/2014   Pneumococcal Polysaccharide-23 05/23/2010, 06/23/2018   Zoster, Live 12/05/2014    Past Medical History:  Diagnosis Date   Anemia    Anxiety    Arthritis    CKD (chronic kidney disease), stage II    COPD (chronic obstructive pulmonary disease) ([place])    Coronary artery disease    a. s/p CABG 1999.   Depression    Diabetes mellitus    Diverticulosis    GERD (gastroesophageal reflux disease)    Hiatal hernia    History of echocardiogram    Echocardiogram 10/21: EF 60-65, no RWMA, Gr 1 DD, normal RVSF, RVSP 15.2    History of nuclear stress test    Myoview 10/21: EF 62, normal perfusion; low risk    Hypercholesteremia    Hypertension    Sleep apnea    had sleep study and negative per pt   Thyroid disease    Tobacco abuse    Tubular adenoma of colon     Tobacco History: Social History   Tobacco Use  Smoking Status Former   Packs/day: 2.00   Years: 35.00   Total pack years: 70.00   Types: Cigarettes   Quit date: 09/27/1997   Years since quitting: 24.3  Smokeless Tobacco Never   Counseling given: Not Answered   Outpatient Medications Prior to Visit  Medication Sig Dispense Refill   albuterol (PROAIR  HFA) 108 (90 Base) MCG/ACT inhaler Inhale 2 puffs into the lungs every 4 (four) hours as needed for wheezing or shortness of breath. 1 each 3   albuterol (PROVENTIL) (2.5 MG/3ML) 0.083% nebulizer solution Take 3 mLs (2.5 mg total) by nebulization every 6 (six) hours as needed for wheezing or shortness of breath. 360 mL 5   ALPRAZolam (XANAX) 0.5 MG tablet Take 0.5 mg by mouth at bedtime.      antiseptic oral rinse (BIOTENE) LIQD 15 mLs by Mouth Rinse route daily.     aspirin 81 MG tablet Take 81 mg by mouth at bedtime.      benzonatate (TESSALON) 200 MG capsule Take 1 capsule (200 mg total) by mouth 3 (three) times daily as needed for cough. 30 capsule 1   Coenzyme Q10 (CO Q 10) 100 MG CAPS Take 100 mg by mouth at bedtime.      colesevelam (WELCHOL) 625 MG tablet TAKE 3 TABLETS BY MOUTH TWICE DAILY WITH MEALS 540 tablet 2   dextromethorphan-guaiFENesin (MUCINEX DM) 30-600 MG 12hr tablet Take 1 tablet by mouth 2 (two) times daily. 60 tablet 0   doxepin (SINEQUAN) 50 MG capsule Take 100 mg by mouth at bedtime.      esomeprazole (NEXIUM) 20 MG capsule Take 20 mg by mouth 2 (two) times daily.     fluticasone (FLONASE) 50 MCG/ACT nasal spray Use 1  spray(s) in each nostril once daily 16 g 3   furosemide (LASIX) 40 MG tablet Take one tablet ('40mg'$ ) up to twice a day as needed for weight gain greater than 2 lbs overnight or 5 pounds in a week. 30 tablet 0   HUMALOG 100 UNIT/ML injection 80 Units.   6   hydrochlorothiazide (HYDRODIURIL) 25 MG tablet Take 1 tablet by mouth once daily 90 tablet 3   levothyroxine (SYNTHROID) 112 MCG tablet Take 1 tablet by mouth daily before breakfast.     loratadine (CLARITIN) 10 MG tablet Take 1 tablet (10 mg total) by mouth daily. 30 tablet 2   losartan (COZAAR) 100 MG tablet Take 1 tablet by mouth once daily 90 tablet 3   metFORMIN (GLUCOPHAGE) 500 MG tablet Take 1 tablet (500 mg total) by mouth 2 (two) times daily with a meal.     metoprolol succinate (TOPROL-XL) 50 MG 24  hr tablet TAKE 1 TABLET BY MOUTH ONCE DAILY WITH  OR  IMMEDIATELY  FOLLOWING  A  MEAL 90 tablet 3   Multiple Vitamins-Minerals (CENTRUM SILVER 50+MEN) TABS Take 1 tablet by mouth daily.     nitroGLYCERIN (NITROSTAT) 0.4 MG SL tablet Place 1 tablet (0.4 mg total) under the tongue every 5 (five) minutes as needed for chest pain. 25 tablet 3   Omega-3 Fatty Acids (FISH OIL) 600 MG CAPS Take 1,400 mg by mouth at bedtime.      pravastatin (PRAVACHOL) 40 MG tablet Take 1 tablet by mouth once daily 90 tablet 3   SYMBICORT 160-4.5 MCG/ACT inhaler Inhale 2 puffs into the lungs 2 (two) times daily. 11 g 2   tamsulosin (FLOMAX) 0.4 MG CAPS capsule SMARTSIG:1 Capsule(s) By Mouth Every Evening     Tiotropium Bromide Monohydrate (SPIRIVA RESPIMAT) 2.5 MCG/ACT AERS INHALE 2 SPRAY(S) BY MOUTH ONCE DAILY 4 g 5   predniSONE (DELTASONE) 10 MG tablet Take 4 tabs by mouth for 3 days, then 3 for 3 days, 2 for 3 days, 1 for 3 days and stop (Patient not taking: Reported on 01/23/2022) 30 tablet 0   predniSONE (DELTASONE) 20 MG tablet Take 1 tablet (20 mg total) by mouth daily with breakfast. (Patient not taking: Reported on 01/23/2022) 30 tablet 0   Facility-Administered Medications Prior to Visit  Medication Dose Route Frequency Provider Last Rate Last Admin   0.9 %  sodium chloride infusion  500 mL Intravenous Continuous Milus Banister, MD       methylPREDNISolone acetate (DEPO-MEDROL) injection 80 mg  80 mg Intramuscular Once Ary Lavine, Karie Schwalbe, NP         Review of Systems:   Constitutional: No weight loss or gain, night sweats, fevers, chills, fatigue, or lassitude. HEENT: No headaches, difficulty swallowing, tooth/dental problems, or sore throat. No sneezing, itching, ear ache, nasal congestion, or post nasal drip CV:  No chest pain, orthopnea, PND, swelling in lower extremities, anasarca, dizziness, palpitations, syncope Resp: No shortness of breath with exertion or at rest. No excess mucus or change in color  of mucus. No productive or non-productive. No hemoptysis. No wheezing.  No chest wall deformity GI:  No heartburn, indigestion, abdominal pain, nausea, vomiting, diarrhea, change in bowel habits, loss of appetite, bloody stools.  GU: No dysuria, change in color of urine, urgency or frequency.  No flank pain, no hematuria  Skin: No rash, lesions, ulcerations MSK:  No joint pain or swelling.  No decreased range of motion.  No back pain. Neuro: No dizziness or lightheadedness.  Psych: No depression or anxiety. Mood stable.     Physical Exam:  BP 138/70 (BP Location: Right Arm, Cuff Size: Normal)   Pulse 94   Temp 98.2 F (36.8 C) (Temporal)   Ht '5\' 9"'$  (1.753 m)   Wt 195 lb 6.4 oz (88.6 kg)   SpO2 93%   BMI 28.86 kg/m   GEN: Pleasant, interactive, well-nourished/chronically-ill appearing/acutely-ill appearing/poorly-nourished/morbidly obese; in no acute distress.****** HEENT:  Normocephalic and atraumatic. EACs patent bilaterally. TM pearly gray with present light reflex bilaterally. PERRLA. Sclera white. Nasal turbinates pink, moist and patent bilaterally. No rhinorrhea present. Oropharynx pink and moist, without exudate or edema. No lesions, ulcerations, or postnasal drip.  NECK:  Supple w/ fair ROM. No JVD present. Normal carotid impulses w/o bruits. Thyroid symmetrical with no goiter or nodules palpated. No lymphadenopathy.   CV: RRR, no m/r/g, no peripheral edema. Pulses intact, +2 bilaterally. No cyanosis, pallor or clubbing. PULMONARY:  Unlabored, regular breathing. Clear bilaterally A&P w/o wheezes/rales/rhonchi. No accessory muscle use. No dullness to percussion. GI: BS present and normoactive. Soft, non-tender to palpation. No organomegaly or masses detected. No CVA tenderness. MSK: No erythema, warmth or tenderness. Cap refil <2 sec all extrem. No deformities or joint swelling noted.  Neuro: A/Ox3. No focal deficits noted.   Skin: Warm, no lesions or rashe Psych: Normal affect  and behavior. Judgement and thought content appropriate.     Lab Results:  CBC    Component Value Date/Time   WBC 7.2 03/14/2021 1226   WBC 8.6 07/31/2018 1438   RBC 4.26 03/14/2021 1226   RBC 4.22 07/31/2018 1438   HGB 12.9 (L) 03/14/2021 1226   HCT 39.2 03/14/2021 1226   PLT 227 03/14/2021 1226   MCV 92 03/14/2021 1226   MCH 30.3 03/14/2021 1226   MCH 21.3 (L) 06/05/2016 1055   MCHC 32.9 03/14/2021 1226   MCHC 33.1 07/31/2018 1438   RDW 14.1 03/14/2021 1226   LYMPHSABS 2.0 07/31/2018 1438   MONOABS 1.1 (H) 07/31/2018 1438   EOSABS 0.2 07/31/2018 1438   BASOSABS 0.1 07/31/2018 1438    BMET    Component Value Date/Time   NA 139 11/24/2021 1432   K 4.7 11/24/2021 1432   CL 98 11/24/2021 1432   CO2 25 11/24/2021 1432   GLUCOSE 166 (H) 11/24/2021 1432   GLUCOSE 140 (H) 06/05/2016 1055   BUN 18 11/24/2021 1432   CREATININE 1.18 (H) 11/24/2021 1432   CREATININE 1.26 (H) 06/05/2016 1055   CALCIUM 9.4 11/24/2021 1432   GFRNONAA 60 06/03/2020 1216   GFRAA 69 06/03/2020 1216    BNP No results found for: "BNP"   Imaging:  No results found.       Latest Ref Rng & Units 08/24/2020    1:34 PM 04/07/2014    2:52 PM  PFT Results  FVC-Pre L 1.93  2.49  P  FVC-Predicted Pre % 46  57  P  FVC-Post L 2.30  2.84  P  FVC-Predicted Post % 55  65  P  Pre FEV1/FVC % % 56  51  P  Post FEV1/FCV % % 55  56  P  FEV1-Pre L 1.09  1.28  P  FEV1-Predicted Pre % 35  39  P  FEV1-Post L 1.26  1.60  P  DLCO uncorrected ml/min/mmHg 14.42  14.73  P  DLCO UNC% % 58  47  P  DLCO corrected ml/min/mmHg 14.42    DLCO COR %Predicted % 58    DLVA Predicted %  95  57  P  TLC L 6.52  6.10  P  TLC % Predicted % 95  89  P  RV % Predicted % 138  145  P    P Preliminary result    Lab Results  Component Value Date   NITRICOXIDE 6 05/09/2016        Assessment & Plan:   No problem-specific Assessment & Plan notes found for this encounter.   I spent *** minutes of dedicated to the  care of this patient on the date of this encounter to include pre-visit review of records, face-to-face time with the patient discussing conditions above, post visit ordering of testing, clinical documentation with the electronic health record, making appropriate referrals as documented, and communicating necessary findings to members of the patients care team.  Clayton Bibles, NP 01/23/2022  Pt aware and understands NP's role.

## 2022-01-23 NOTE — Patient Instructions (Addendum)
-  Continue Albuterol inhaler 2 puffs or 3 mL neb every 6 hours as needed for shortness of breath or wheezing. Notify if symptoms persist despite rescue inhaler/neb use. -Continue Nexium 20 Twice daily  -Continue flonase 1 spray each nostril daily -Continue claritin 10 mg daily for allergies  -Can continue mucinex DM Twice daily as needed for chest congestion or cough -Decrease prednisone to 10 mg daily. Continue prednisone 10 mg daily for a week then decrease to 5 mg a day for a week then stop.  Continue Symbicort 2 puffs Twice daily. Brush tongue and rinse mouth afterwards Continue Spiriva 2 puffs daily You will stop your Symbicort and Spiriva once your nebulized medications come in the mail then you will start... -Brovana 2 mL neb Twice daily  -Budesonide 2 mL neb Twice daily. Brush tongue and rinse mouth afterwards -Yupelri 3 mL daily   Increase lasix to 40 mg for 5 days  Follow up ASAP with cardiology regarding swelling in your legs  Chest x ray today and labs  CT chest wo contrast - someone will contact you for scheduling.    Follow up in two weeks with [name] or Alanson Aly. If symptoms do not improve or worsen, please contact office for sooner follow up or seek emergency care.

## 2022-01-23 NOTE — Progress Notes (Unsigned)
$'@Patient'z$  ID: [name], male    DOB: [birth year], 76 y.o.   MRN: [id number]  Chief Complaint  Patient presents with   Follow-up    Increased sob x 2-3 wks., wheezing, denies cough    Referring provider: [name], MD  HPI: 76 year old male, former smoker (70 pack years) followed for COPD and rhinitis.  He is a patient of [name] and last seen in office on 09/26/2021 by [facility] NP.  Past medical history significant for CAD, GERD, DM, HLD.  TEST/EVENTS:  05/24/2020 echocardiogram: EF 60 to 65%.  G1 DD.  Otherwise normal exam 08/24/2020 PFTs: FVC 2.3 (55), FEV1 1.26 (41), ratio 55, TLC 95%, DLCOunc 58%.  Positive BD (16%).  Severe obstructive airway disease with decreased diffusion capacity 08/29/2021 CXR 2 view: Normal heart size status post CABG.  Pulmonary vasculature normal.  Scarring in mid right lateral chest wall which is unchanged.  There was no acute process noted.  08/29/2021: [name] with [name] NP.  Acute symptoms of head and chest congestion with productive cough x1 week.  Associated shortness of breath with exertion and wheezing.  Saw PCP last week and was given prescription for antibiotics but is unsure name and prednisone taper.  Verified with pharmacy that antibiotic was azithromycin.  Reported minimal improvement after taking.  CXR obtained without acute process.  Treated for AECOPD with Depo injection and Augmentin course.  Advised to stay on prednisone taper.  Continue Mucinex 1200 mg twice a day.  Advised follow-up in 2 weeks.   09/12/2021: [name] with [name] NP for follow-up.  Previously treated with Augmentin and prednisone taper.  Reported still having some sinus congestion shortness of breath and new leg swelling.  Wheezing is not as bad.  No longer having cough.  BNP was obtained and normal.  Treated with increased Lasix course.  Continued on Symbicort and Spiriva.  Started Claritin and Flonase for sinus congestion.   09/18/2021: OV with [name]. Previously seen by [name],  NP and treated for AECOPD.  Reported that he has started to feel better however over the past few days before this visit he reported increase in productive cough and worsening shortness of breath with increased wheezing.  Denied any sick exposures in the interim.  Sputum culture changed which grew H. influenzae.  Treated with Levaquin.  Held off on more prednisone due to increased blood sugars.  Advised to increase Lasix to every day for 3 to 5 days then resume every other day.  09/26/2021: OV with Shaeleigh Graw NP.  Persistent shortness of breath and cough; felt cough had mildly improved with decreased sputum production.  Completed Levaquin course on Sunday. BLE edema had significantly improved. CXR obtained which was unchanged without evidence of superimposed infection. Previously tx with 3 courses of abx. Bronchospasm evident on exam - depo inj and prednisone taper ordered. Continued on triple therapy with Symbicort and Spiriva. Targeted cough suppression and postnasal drainage control. Close follow up.    10/06/2021: OV with Gerrard Crystal, NP for follow up after being treated for URI and AECOPD. He has been seen multiple times since; feels as though he never fully recovered after having pneumonia in October and has had difficulties with his breathing since. He does feel as though he is doing better today. His cough has resolved. He still has shortness of breath with exertion but he has been able to go to the gym and walk on the treadmill, which is significant improvement. He is still using his albuterol neb  three times a day, in the morning, before he goes to the gym, and before bed. He continues on Symbicort and Spiriva. He has been taking his lasix 20 mg daily. He does have some increased swelling in both lower legs but states that this happens every time he is on prednisone. He is still on his prednisone taper with two days left.  Overall, he is slowly improving.   11/07/2021: OV with [name]. Exacerbating recently, treated  with multiple abx and steroids. Was doing better. Recently went on a cruise and developed increased DOE and productive cough. Started prednisone on his own; unsure if he feels much better, maybe slightly. Still has swelling despite 20 mg daily lasix. Difficult to determine whether his symptoms are acute or subacute.  He started a prednisone taper on his own and we will complete it.  He also asked for Depo-Medrol today.  I think he may need maintenance prednisone dosing.  I will decide this at his next visit.  We will rule out occult hypoxemia with ambulatory oximetry today.  Continue Symbicort and Spiriva, albuterol as needed.  Treat his GERD and rhinitis as able. Concerned volume overload contributing - follow up with cardiology and increase lasix for 5 days.   12/20/2021: OV with [name]. Swelling improved with increased lasix dose. Feels minimal improvement in his breathing. Significant COPD.  Unclear whether he has benefited from steroids, he has difficulty reporting.  For that matter difficult to say whether he benefited much from the extra Lasix that I gave him 6 weeks ago although his lower extremity edema did get better.  We will do a trial of prednisone 20 mg daily for about 1 month.  He will keep track of whether it helps his breathing.  If so we can continue him on chronic prednisone, try to decrease the dose to the lowest effective.  I suspect that he will not notice much difference.  If that is the case then we will stop the prednisone, try to work harder on his exercise and conditioning.  He does not desaturate with exertion  01/23/2022: Today - follow up Patient presents today for follow-up after being started on trial of 20 mg of prednisone daily for 1 month.  He has not noticed much of a difference in his breathing despite prednisone.  He still gets very winded with minimal exertion.  He has had difficulty with exercising at the gym; there is about 2 flights of stairs that he has to climb to  get up to the workout floor.  After he does this he feels significantly winded and has to sit and rest before he can get on the treadmill.  He has not been able to walk quite as far as he used to be able to.  Cough is relatively unchanged from his baseline.  He does continue to have lower extremity swelling; did better when he was on the 40 mg of Lasix daily.  He denies any hemoptysis, wheezing, anorexia, weight loss.  He continues on Symbicort and Spiriva; unsure if they really make a difference.  He notices the biggest difference with his albuterol nebulizer which he uses 3 times a day.  No Known Allergies  Immunization History  Administered Date(s) Administered   Fluad Quad(high Dose 65+) 04/27/2019, 04/26/2020, 05/15/2021   H1N1 07/09/2008   Influenza Split 05/06/2012, 06/21/2015   Influenza Whole 05/23/2010, 05/07/2011   Influenza, High Dose Seasonal PF 05/23/2016, 04/14/2018   Influenza,inj,Quad PF,6+ Mos 05/01/2013, 05/07/2014   Influenza-Unspecified  04/01/2017   PFIZER(Purple Top)SARS-COV-2 Vaccination 09/27/2019, 10/21/2019, 07/04/2020   Pneumococcal Conjugate-13 03/09/2014   Pneumococcal Polysaccharide-23 05/23/2010, 06/23/2018   Zoster, Live 12/05/2014    Past Medical History:  Diagnosis Date   Anemia    Anxiety    Arthritis    CKD (chronic kidney disease), stage II    COPD (chronic obstructive pulmonary disease) ([place])    Coronary artery disease    a. s/p CABG 1999.   Depression    Diabetes mellitus    Diverticulosis    GERD (gastroesophageal reflux disease)    Hiatal hernia    History of echocardiogram    Echocardiogram 10/21: EF 60-65, no RWMA, Gr 1 DD, normal RVSF, RVSP 15.2    History of nuclear stress test    Myoview 10/21: EF 62, normal perfusion; low risk    Hypercholesteremia    Hypertension    Sleep apnea    had sleep study and negative per pt   Thyroid disease    Tobacco abuse    Tubular adenoma of colon     Tobacco History: Social History    Tobacco Use  Smoking Status Former   Packs/day: 2.00   Years: 35.00   Total pack years: 70.00   Types: Cigarettes   Quit date: 09/27/1997   Years since quitting: 24.3  Smokeless Tobacco Never   Counseling given: Not Answered   Outpatient Medications Prior to Visit  Medication Sig Dispense Refill   albuterol (PROAIR HFA) 108 (90 Base) MCG/ACT inhaler Inhale 2 puffs into the lungs every 4 (four) hours as needed for wheezing or shortness of breath. 1 each 3   albuterol (PROVENTIL) (2.5 MG/3ML) 0.083% nebulizer solution Take 3 mLs (2.5 mg total) by nebulization every 6 (six) hours as needed for wheezing or shortness of breath. 360 mL 5   ALPRAZolam (XANAX) 0.5 MG tablet Take 0.5 mg by mouth at bedtime.      antiseptic oral rinse (BIOTENE) LIQD 15 mLs by Mouth Rinse route daily.     aspirin 81 MG tablet Take 81 mg by mouth at bedtime.      benzonatate (TESSALON) 200 MG capsule Take 1 capsule (200 mg total) by mouth 3 (three) times daily as needed for cough. 30 capsule 1   Coenzyme Q10 (CO Q 10) 100 MG CAPS Take 100 mg by mouth at bedtime.      colesevelam (WELCHOL) 625 MG tablet TAKE 3 TABLETS BY MOUTH TWICE DAILY WITH MEALS 540 tablet 2   dextromethorphan-guaiFENesin (MUCINEX DM) 30-600 MG 12hr tablet Take 1 tablet by mouth 2 (two) times daily. 60 tablet 0   doxepin (SINEQUAN) 50 MG capsule Take 100 mg by mouth at bedtime.      esomeprazole (NEXIUM) 20 MG capsule Take 20 mg by mouth 2 (two) times daily.     fluticasone (FLONASE) 50 MCG/ACT nasal spray Use 1 spray(s) in each nostril once daily 16 g 3   furosemide (LASIX) 40 MG tablet Take one tablet ('40mg'$ ) up to twice a day as needed for weight gain greater than 2 lbs overnight or 5 pounds in a week. 30 tablet 0   HUMALOG 100 UNIT/ML injection 80 Units.   6   hydrochlorothiazide (HYDRODIURIL) 25 MG tablet Take 1 tablet by mouth once daily 90 tablet 3   levothyroxine (SYNTHROID) 112 MCG tablet Take 1 tablet by mouth daily before breakfast.      loratadine (CLARITIN) 10 MG tablet Take 1 tablet (10 mg total) by mouth daily. 30 tablet 2  losartan (COZAAR) 100 MG tablet Take 1 tablet by mouth once daily 90 tablet 3   metFORMIN (GLUCOPHAGE) 500 MG tablet Take 1 tablet (500 mg total) by mouth 2 (two) times daily with a meal.     metoprolol succinate (TOPROL-XL) 50 MG 24 hr tablet TAKE 1 TABLET BY MOUTH ONCE DAILY WITH  OR  IMMEDIATELY  FOLLOWING  A  MEAL 90 tablet 3   Multiple Vitamins-Minerals (CENTRUM SILVER 50+MEN) TABS Take 1 tablet by mouth daily.     nitroGLYCERIN (NITROSTAT) 0.4 MG SL tablet Place 1 tablet (0.4 mg total) under the tongue every 5 (five) minutes as needed for chest pain. 25 tablet 3   Omega-3 Fatty Acids (FISH OIL) 600 MG CAPS Take 1,400 mg by mouth at bedtime.      pravastatin (PRAVACHOL) 40 MG tablet Take 1 tablet by mouth once daily 90 tablet 3   SYMBICORT 160-4.5 MCG/ACT inhaler Inhale 2 puffs into the lungs 2 (two) times daily. 11 g 2   tamsulosin (FLOMAX) 0.4 MG CAPS capsule SMARTSIG:1 Capsule(s) By Mouth Every Evening     Tiotropium Bromide Monohydrate (SPIRIVA RESPIMAT) 2.5 MCG/ACT AERS INHALE 2 SPRAY(S) BY MOUTH ONCE DAILY 4 g 5   predniSONE (DELTASONE) 10 MG tablet Take 4 tabs by mouth for 3 days, then 3 for 3 days, 2 for 3 days, 1 for 3 days and stop (Patient not taking: Reported on 01/23/2022) 30 tablet 0   predniSONE (DELTASONE) 20 MG tablet Take 1 tablet (20 mg total) by mouth daily with breakfast. (Patient not taking: Reported on 01/23/2022) 30 tablet 0   Facility-Administered Medications Prior to Visit  Medication Dose Route Frequency Provider Last Rate Last Admin   0.9 %  sodium chloride infusion  500 mL Intravenous Continuous Milus Banister, MD       methylPREDNISolone acetate (DEPO-MEDROL) injection 80 mg  80 mg Intramuscular Once Onika Gudiel, Karie Schwalbe, NP         Review of Systems:   Constitutional: No weight loss or gain, night sweats, fevers, chills, fatigue, or lassitude. HEENT: No headaches,  difficulty swallowing, tooth/dental problems, or sore throat. No sneezing, itching, ear ache, nasal congestion, or post nasal drip CV:  +swelling in bilateral lower extremities. No chest pain, orthopnea, PND, anasarca, dizziness, palpitations, syncope Resp: +shortness of breath with exertion (progressive worsening); chronic daily cough. No excess mucus or change in color of mucus. No productive or non-productive. No hemoptysis. No wheezing.  No chest wall deformity GI:  No heartburn, indigestion, abdominal pain, nausea, vomiting, diarrhea, change in bowel habits, loss of appetite, bloody stools.  Skin: No rash, lesions, ulcerations MSK:  No joint pain or swelling.  No decreased range of motion.  No back pain. Neuro: No dizziness or lightheadedness.  Psych: No depression or anxiety. Mood stable.     Physical Exam:  BP 138/70 (BP Location: Right Arm, Cuff Size: Normal)   Pulse 94   Temp 98.2 F (36.8 C) (Temporal)   Ht '5\' 9"'$  (1.753 m)   Wt 195 lb 6.4 oz (88.6 kg)   SpO2 93%   BMI 28.86 kg/m   GEN: Pleasant, interactive, well-appearing; in no acute distress. HEENT:  Normocephalic and atraumatic. EACs patent bilaterally. TM pearly gray with present light reflex bilaterally. PERRLA. Sclera white. Nasal turbinates pink, moist and patent bilaterally. No rhinorrhea present. Oropharynx pink and moist, without exudate or edema. No lesions, ulcerations, or postnasal drip.  NECK:  Supple w/ fair ROM. No JVD present. Normal carotid impulses w/o  bruits. Thyroid symmetrical with no goiter or nodules palpated. No lymphadenopathy.   CV: RRR, no m/r/g, +1 pitting edema BLE. Pulses intact, +2 bilaterally. No cyanosis, pallor or clubbing. PULMONARY:  Unlabored, regular breathing.  Diminished bilaterally A&P without wheezes/rales/rhonchi. No accessory muscle use. No dullness to percussion. GI: BS present and normoactive. Soft, non-tender to palpation. No organomegaly or masses detected. No CVA  tenderness. MSK: No erythema, warmth or tenderness. Cap refil <2 sec all extrem. No deformities or joint swelling noted.  Neuro: A/Ox3. No focal deficits noted.   Skin: Warm, no lesions or rashe Psych: Normal affect and behavior. Judgement and thought content appropriate.     Lab Results:  CBC    Component Value Date/Time   WBC 7.2 03/14/2021 1226   WBC 8.6 07/31/2018 1438   RBC 4.26 03/14/2021 1226   RBC 4.22 07/31/2018 1438   HGB 12.9 (L) 03/14/2021 1226   HCT 39.2 03/14/2021 1226   PLT 227 03/14/2021 1226   MCV 92 03/14/2021 1226   MCH 30.3 03/14/2021 1226   MCH 21.3 (L) 06/05/2016 1055   MCHC 32.9 03/14/2021 1226   MCHC 33.1 07/31/2018 1438   RDW 14.1 03/14/2021 1226   LYMPHSABS 2.0 07/31/2018 1438   MONOABS 1.1 (H) 07/31/2018 1438   EOSABS 0.2 07/31/2018 1438   BASOSABS 0.1 07/31/2018 1438    BMET    Component Value Date/Time   NA 137 01/23/2022 1158   NA 139 11/24/2021 1432   K 4.0 01/23/2022 1158   CL 99 01/23/2022 1158   CO2 30 01/23/2022 1158   GLUCOSE 177 (H) 01/23/2022 1158   BUN 22 01/23/2022 1158   BUN 18 11/24/2021 1432   CREATININE 1.22 01/23/2022 1158   CREATININE 1.26 (H) 06/05/2016 1055   CALCIUM 9.6 01/23/2022 1158   GFRNONAA 60 06/03/2020 1216   GFRAA 69 06/03/2020 1216    BNP No results found for: "BNP"   Imaging:  DG Chest 2 View  Result Date: 01/23/2022 CLINICAL DATA:  Shortness of breath and bilateral lower extremity edema. EXAM: CHEST - 2 VIEW COMPARISON:  09/26/2021 FINDINGS: Previous median sternotomy and CABG procedure. Heart size is normal. The lungs are hyperinflated. Coarsened interstitial markings of emphysema noted. No pleural effusion, interstitial edema or airspace consolidation. The visualized osseous structures are unremarkable. IMPRESSION: No active cardiopulmonary abnormalities. Emphysema (ICD10-J43.9). Electronically Signed   By: Kerby Moors M.D.   On: 01/23/2022 12:20         Latest Ref Rng & Units 08/24/2020     1:34 PM 04/07/2014    2:52 PM  PFT Results  FVC-Pre L 1.93  2.49  P  FVC-Predicted Pre % 46  57  P  FVC-Post L 2.30  2.84  P  FVC-Predicted Post % 55  65  P  Pre FEV1/FVC % % 56  51  P  Post FEV1/FCV % % 55  56  P  FEV1-Pre L 1.09  1.28  P  FEV1-Predicted Pre % 35  39  P  FEV1-Post L 1.26  1.60  P  DLCO uncorrected ml/min/mmHg 14.42  14.73  P  DLCO UNC% % 58  47  P  DLCO corrected ml/min/mmHg 14.42    DLCO COR %Predicted % 58    DLVA Predicted % 95  57  P  TLC L 6.52  6.10  P  TLC % Predicted % 95  89  P  RV % Predicted % 138  145  P    P Preliminary result  Lab Results  Component Value Date   NITRICOXIDE 6 05/09/2016        Assessment & Plan:   GOLD COPD C Progressive decline and worsening DOE over the past few months.  He has not had a significant response to prednisone.  He does feel like he does better with nebulizer treatments than his inhalers.  We will switch him to triple therapy nebs and stop Symbicort and Spiriva.  He was also agreeable to pulmonary rehab so referral was placed today.  Did not have any desaturations on previous walking oximetry.  We will check ONO to rule out nocturnal hypoxemia.  CXR today without acute process or superimposed infection.  Recommended that given his progressive DOE and significant smoking history, we checked CT chest for further evaluation.  Patient Instructions  -Continue Albuterol inhaler 2 puffs or 3 mL neb every 6 hours as needed for shortness of breath or wheezing. Notify if symptoms persist despite rescue inhaler/neb use. -Continue Nexium 20 Twice daily  -Continue flonase 1 spray each nostril daily -Continue claritin 10 mg daily for allergies  -Can continue mucinex DM Twice daily as needed for chest congestion or cough -Decrease prednisone to 10 mg daily. Continue prednisone 10 mg daily for a week then decrease to 5 mg a day for a week then stop.  Continue Symbicort 2 puffs Twice daily. Brush tongue and rinse mouth  afterwards Continue Spiriva 2 puffs daily You will stop your Symbicort and Spiriva once your nebulized medications come in the mail then you will start... -Brovana 2 mL neb Twice daily  -Budesonide 2 mL neb Twice daily. Brush tongue and rinse mouth afterwards -Yupelri 3 mL daily   Increase lasix to 40 mg for 5 days  Follow up ASAP with cardiology regarding swelling in your legs  Chest x ray today and labs  CT chest wo contrast - someone will contact you for scheduling.    Follow up in two weeks with [name] or Alanson Aly. If symptoms do not improve or worsen, please contact office for sooner follow up or seek emergency care.   Lower extremity edema Persistent bilateral lower extremity edema.  Previous BNP was normal.  He has had improvement with 40 mg of Lasix daily.  We will check BMET today and have him restart this.  Do believe that his volume overload is contributing to his DOE as well. Advised him that he needs to follow-up with cardiology ASAP.  Dyspnea Multifactorial related to progressive COPD and volume overload.  No significant response to prednisone.  Given his history of COPD is increased bilateral lower extremity swelling, advised that we obtain echocardiogram to evaluate heart function as well as rule out pulmonary hypertension.  See above plan.    I spent 35 minutes of dedicated to the care of this patient on the date of this encounter to include pre-visit review of records, face-to-face time with the patient discussing conditions above, post visit ordering of testing, clinical documentation with the electronic health record, making appropriate referrals as documented, and communicating necessary findings to members of the patients care team.  Clayton Bibles, NP 01/24/2022  Pt aware and understands NP's role.

## 2022-01-24 ENCOUNTER — Encounter: Payer: Self-pay | Admitting: Nurse Practitioner

## 2022-01-24 NOTE — Assessment & Plan Note (Signed)
Multifactorial related to progressive COPD and volume overload.  No significant response to prednisone.  Given his history of COPD is increased bilateral lower extremity swelling, advised that we obtain echocardiogram to evaluate heart function as well as rule out pulmonary hypertension.  See above plan.

## 2022-01-24 NOTE — Assessment & Plan Note (Signed)
Persistent bilateral lower extremity edema.  Previous BNP was normal.  He has had improvement with 40 mg of Lasix daily.  We will check BMET today and have him restart this.  Do believe that his volume overload is contributing to his DOE as well. Advised him that he needs to follow-up with cardiology ASAP.

## 2022-01-24 NOTE — Assessment & Plan Note (Signed)
Progressive decline and worsening DOE over the past few months.  He has not had a significant response to prednisone.  He does feel like he does better with nebulizer treatments than his inhalers.  We will switch him to triple therapy nebs and stop Symbicort and Spiriva.  He was also agreeable to pulmonary rehab so referral was placed today.  Did not have any desaturations on previous walking oximetry.  We will check ONO to rule out nocturnal hypoxemia.  CXR today without acute process or superimposed infection.  Recommended that given his progressive DOE and significant smoking history, we checked CT chest for further evaluation.  Patient Instructions  -Continue Albuterol inhaler 2 puffs or 3 mL neb every 6 hours as needed for shortness of breath or wheezing. Notify if symptoms persist despite rescue inhaler/neb use. -Continue Nexium 20 Twice daily  -Continue flonase 1 spray each nostril daily -Continue claritin 10 mg daily for allergies  -Can continue mucinex DM Twice daily as needed for chest congestion or cough -Decrease prednisone to 10 mg daily. Continue prednisone 10 mg daily for a week then decrease to 5 mg a day for a week then stop.  Continue Symbicort 2 puffs Twice daily. Brush tongue and rinse mouth afterwards Continue Spiriva 2 puffs daily You will stop your Symbicort and Spiriva once your nebulized medications come in the mail then you will start... -Brovana 2 mL neb Twice daily  -Budesonide 2 mL neb Twice daily. Brush tongue and rinse mouth afterwards -Yupelri 3 mL daily   Increase lasix to 40 mg for 5 days  Follow up ASAP with cardiology regarding swelling in your legs  Chest x ray today and labs  CT chest wo contrast - someone will contact you for scheduling.    Follow up in two weeks with [name] or Alanson Aly. If symptoms do not improve or worsen, please contact office for sooner follow up or seek emergency care.

## 2022-01-25 ENCOUNTER — Telehealth: Payer: Self-pay | Admitting: Nurse Practitioner

## 2022-01-25 NOTE — Telephone Encounter (Signed)
Called patient and explained why CT was ordered. Patient voiced understanding and confirmed appointment for CT

## 2022-01-26 ENCOUNTER — Ambulatory Visit: Payer: Medicare Other | Admitting: Primary Care

## 2022-01-27 ENCOUNTER — Other Ambulatory Visit: Payer: Self-pay | Admitting: Cardiology

## 2022-01-29 ENCOUNTER — Other Ambulatory Visit: Payer: Self-pay

## 2022-01-29 ENCOUNTER — Ambulatory Visit (HOSPITAL_COMMUNITY): Payer: Medicare Other | Attending: Nurse Practitioner

## 2022-01-29 DIAGNOSIS — R0602 Shortness of breath: Secondary | ICD-10-CM | POA: Insufficient documentation

## 2022-01-29 DIAGNOSIS — R600 Localized edema: Secondary | ICD-10-CM | POA: Diagnosis not present

## 2022-01-29 LAB — ECHOCARDIOGRAM COMPLETE
Area-P 1/2: 4.41 cm2
S' Lateral: 2.7 cm

## 2022-01-29 MED ORDER — PRAVASTATIN SODIUM 40 MG PO TABS: 40.0000 mg | ORAL_TABLET | Freq: Every day | ORAL | 3 refills | Status: DC

## 2022-02-01 ENCOUNTER — Ambulatory Visit (HOSPITAL_COMMUNITY)
Admission: RE | Admit: 2022-02-01 | Discharge: 2022-02-01 | Disposition: A | Discharge status: Home / Self Care | Payer: Medicare Other | Source: Ambulatory Visit | Attending: Nurse Practitioner | Admitting: Nurse Practitioner

## 2022-02-01 DIAGNOSIS — J432 Centrilobular emphysema: Secondary | ICD-10-CM | POA: Diagnosis not present

## 2022-02-01 DIAGNOSIS — R0602 Shortness of breath: Secondary | ICD-10-CM | POA: Insufficient documentation

## 2022-02-02 ENCOUNTER — Telehealth: Payer: Self-pay | Admitting: Nurse Practitioner

## 2022-02-02 DIAGNOSIS — J449 Chronic obstructive pulmonary disease, unspecified: Secondary | ICD-10-CM | POA: Diagnosis not present

## 2022-02-07 ENCOUNTER — Encounter: Payer: Self-pay | Admitting: Nurse Practitioner

## 2022-02-07 ENCOUNTER — Ambulatory Visit (INDEPENDENT_AMBULATORY_CARE_PROVIDER_SITE_OTHER): Payer: Medicare Other | Admitting: Nurse Practitioner

## 2022-02-07 VITALS — BP 136/74 | HR 92 | Temp 98.3°F | Ht 69.0 in | Wt 195.0 lb

## 2022-02-07 DIAGNOSIS — J449 Chronic obstructive pulmonary disease, unspecified: Secondary | ICD-10-CM

## 2022-02-07 DIAGNOSIS — R600 Localized edema: Secondary | ICD-10-CM

## 2022-02-07 DIAGNOSIS — J439 Emphysema, unspecified: Secondary | ICD-10-CM | POA: Diagnosis not present

## 2022-02-07 MED ORDER — BUDESONIDE 0.5 MG/2ML IN SUSP: 0.5000 mg | Freq: Two times a day (BID) | RESPIRATORY_TRACT | 3 refills | Status: DC

## 2022-02-07 NOTE — Assessment & Plan Note (Addendum)
Progressive decline and worsening DOE over the past few months.  He has not had a significant response to prednisone so was tapered off after month long trial. He does feel like he does better with nebulizer treatments than his inhalers so decision was made at previous OV to switch him to triple therapy nebs. He has yet to receive budesonide, which was on backorder through [facility] so we sent to local pharmacy today. He was able to get a spot in pulmonary rehab already and will start tomorrow. He did not have any desaturations on previous walking oximetry; ONO showed minimal amount of time spent at or below 88% (6 min 56 sec; average 93%%), which we discussed qualifies him for nocturnal O2 but he would like to hold off for now. CT without any suspicious nodules/masses or acute process.   Patient Instructions  -Continue Albuterol inhaler 2 puffs or 3 mL neb every 6 hours as needed for shortness of breath or wheezing. Notify if symptoms persist despite rescue inhaler/neb use. -Continue Nexium 20 Twice daily  -Continue flonase 1 spray each nostril daily -Continue claritin 10 mg daily for allergies  -Can continue mucinex DM Twice daily as needed for chest congestion or cough   Continue Symbicort 2 puffs Twice daily. Brush tongue and rinse mouth afterwards Continue Spiriva 2 puffs daily You will stop your Symbicort and Spiriva once you receive all or your nebulized medications then you will start... -Brovana 2 mL neb Twice daily  -Budesonide 2 mL neb Twice daily. Brush tongue and rinse mouth afterwards -Yupelri 3 mL daily    Take lasix 40 mg daily as prescribed. Ok to take twice daily for leg swelling, 2 lbs overnight or 5 lbs in a week Follow up ASAP with cardiology regarding swelling in your legs  Continue pulmonary rehab    Follow up in three months with [name]. If symptoms do not improve or worsen, please contact office for sooner follow up or seek emergency care.

## 2022-02-07 NOTE — Patient Instructions (Addendum)
-  Continue Albuterol inhaler 2 puffs or 3 mL neb every 6 hours as needed for shortness of breath or wheezing. Notify if symptoms persist despite rescue inhaler/neb use. -Continue Nexium 20 Twice daily  -Continue flonase 1 spray each nostril daily -Continue claritin 10 mg daily for allergies  -Can continue mucinex DM Twice daily as needed for chest congestion or cough   Continue Symbicort 2 puffs Twice daily. Brush tongue and rinse mouth afterwards Continue Spiriva 2 puffs daily You will stop your Symbicort and Spiriva once you receive all or your nebulized medications then you will start... -Brovana 2 mL neb Twice daily  -Budesonide 2 mL neb Twice daily. Brush tongue and rinse mouth afterwards -Yupelri 3 mL daily    Take lasix 40 mg daily as prescribed. Ok to take twice daily for leg swelling, 2 lbs overnight or 5 lbs in a week Follow up ASAP with cardiology regarding swelling in your legs  Continue pulmonary rehab    Follow up in three months with [name]. If symptoms do not improve or worsen, please contact office for sooner follow up or seek emergency care.

## 2022-02-07 NOTE — Assessment & Plan Note (Signed)
Persistent BLE edema. Echo showed low normal EF and normal diastolic parameters. He is currently taking 40 mg of lasix Twice daily every other day and then skipping a day in between. I advised him to restart it how Dr. Marlou Porch prescribed, which was 40 mg daily and an additional dose base on weight. Check BMET today. He is going to contact cardiology today to schedule an appointment.

## 2022-02-07 NOTE — Progress Notes (Signed)
$'@Patient'k$  ID: Oscar Robinson, male    DOB: 18-Feb-1946, 76 y.o.   MRN: [id number]  Chief Complaint  Patient presents with   Follow-up    Patient states he is still having trouble breathing.     Referring provider: Enid Robinson., MD  HPI: 76 year old male, former smoker (70 pack years) followed for COPD and rhinitis.  He is a patient of Oscar Robinson and last seen in office on 09/26/2021 by Oscar Robinson.  Past medical history significant for CAD, GERD, DM, HLD.  TEST/EVENTS:  05/24/2020 echocardiogram: EF 60 to 65%.  G1 DD.  Otherwise normal exam 08/24/2020 PFTs: FVC 2.3 (55), FEV1 1.26 (41), ratio 55, TLC 95%, DLCOunc 58%.  Positive BD (16%).  Severe obstructive airway disease with decreased diffusion capacity 08/29/2021 CXR 2 view: Normal heart size status post CABG.  Pulmonary vasculature normal.  Scarring in mid right lateral chest wall which is unchanged.  There was no acute process noted. 01/31/2022 ONO: 6 min and 56 sec <88% with average 93%.  02/01/2022 CT chest wo contrast: CAD. Evidence of past CABG. There were no acute findings or suspicious nodules. Centrilobular emphysema was present.   08/29/2021: Oscar Robinson with Oscar Napoleon Robinson.  Acute symptoms of head and chest congestion with productive cough x1 week.  Associated shortness of breath with exertion and wheezing.  Saw PCP last week and was given prescription for antibiotics but is unsure name and prednisone taper.  Verified with pharmacy that antibiotic was azithromycin.  Reported minimal improvement after taking.  CXR obtained without acute process.  Treated for AECOPD with Depo injection and Augmentin course.  Advised to stay on prednisone taper.  Continue Mucinex 1200 mg twice a day.  Advised follow-up in 2 weeks.   09/12/2021: Oscar Robinson with Oscar Napoleon Robinson for follow-up.  Previously treated with Augmentin and prednisone taper.  Reported still having some sinus congestion shortness of breath and new leg swelling.  Wheezing is not as bad.  No longer having cough.   BNP was obtained and normal.  Treated with increased Lasix course.  Continued on Symbicort and Spiriva.  Started Claritin and Flonase for sinus congestion.   09/18/2021: OV with Oscar Robinson. Previously seen by Oscar Napoleon, Robinson and treated for AECOPD.  Reported that he has started to feel better however over the past few days before this visit he reported increase in productive cough and worsening shortness of breath with increased wheezing.  Denied any sick exposures in the interim.  Sputum culture changed which grew H. influenzae.  Treated with Levaquin.  Held off on more prednisone due to increased blood sugars.  Advised to increase Lasix to every day for 3 to 5 days then resume every other day.  09/26/2021: OV with Oscar Casino Robinson.  Persistent shortness of breath and cough; felt cough had mildly improved with decreased sputum production.  Completed Levaquin course on Sunday. BLE edema had significantly improved. CXR obtained which was unchanged without evidence of superimposed infection. Previously tx with 3 courses of abx. Bronchospasm evident on exam - depo inj and prednisone taper ordered. Continued on triple therapy with Symbicort and Spiriva. Targeted cough suppression and postnasal drainage control. Close follow up.    10/06/2021: OV with Oscar Defalco, Robinson for follow up after being treated for URI and AECOPD. He has been seen multiple times since; feels as though he never fully recovered after having pneumonia in October and has had difficulties with his breathing since. He does feel as though he is doing better today. His cough  has resolved. He still has shortness of breath with exertion but he has been able to go to the gym and walk on the treadmill, which is significant improvement. He is still using his albuterol neb three times a day, in the morning, before he goes to the gym, and before bed. He continues on Symbicort and Spiriva. He has been taking his lasix 20 mg daily. He does have some increased swelling in both lower legs  but states that this happens every time he is on prednisone. He is still on his prednisone taper with two days left.  Overall, he is slowly improving.   11/07/2021: OV with Oscar Robinson. Exacerbating recently, treated with multiple abx and steroids. Was doing better. Recently went on a cruise and developed increased DOE and productive cough. Started prednisone on his own; unsure if he feels much better, maybe slightly. Still has swelling despite 20 mg daily lasix. Difficult to determine whether his symptoms are acute or subacute.  He started a prednisone taper on his own and we will complete it.  He also asked for Depo-Medrol today.  I think he may need maintenance prednisone dosing.  I will decide this at his next visit.  We will rule out occult hypoxemia with ambulatory oximetry today.  Continue Symbicort and Spiriva, albuterol as needed.  Treat his GERD and rhinitis as able. Concerned volume overload contributing - follow up with cardiology and increase lasix for 5 days.   12/20/2021: OV with Oscar Robinson. Swelling improved with increased lasix dose. Feels minimal improvement in his breathing. Significant COPD.  Unclear whether he has benefited from steroids, he has difficulty reporting.  For that matter difficult to say whether he benefited much from the extra Lasix that I gave him 6 weeks ago although his lower extremity edema did get better.  We will do a trial of prednisone 20 mg daily for about 1 month.  He will keep track of whether it helps his breathing.  If so we can continue him on chronic prednisone, try to decrease the dose to the lowest effective.  I suspect that he will not notice much difference.  If that is the case then we will stop the prednisone, try to work harder on his exercise and conditioning.  He does not desaturate with exertion  01/23/2022: OV with Oscar Leh Robinson for follow-up after being started on trial of 20 mg of prednisone daily for 1 month.  He has not noticed much of a difference in his  breathing despite prednisone.  He still gets very winded with minimal exertion.  He has had difficulty with exercising at the gym; there is about 2 flights of stairs that he has to climb to get up to the workout floor.  After he does this he feels significantly winded and has to sit and rest before he can get on the treadmill.  He has not been able to walk quite as far as he used to be able to.  Cough is relatively unchanged from his baseline.  He does continue to have lower extremity swelling; did better when he was on the 40 mg of Lasix daily.  Not consistent with use. Recommended to restart daily; echocardiogram ordered. Advised he follow up with cardiology ASAP. He continues on Symbicort and Spiriva; unsure if they really make a difference.  He notices the biggest difference with his albuterol nebulizer which he uses 3 times a day. Changed to triple therapy nebulized medications and stopped inhalers. Referral placed to pulmonary rehab.  CT chest was ordered to rule out underlying malignancy or unknown pulm etiology given progressive DOE - no suspicious nodules or areas of concern. ONO to rule out nocturnal hypoxemia. Tapered him off the prednisone as he didn't feel like it made a difference.  02/07/2022: Today - follow up Patient presents today for follow up after being tapered off prednisone and started on triple therapy nebs. He received the [place] but has yet to receive the budesonide so he has not made the switch yet. Breathing is unchanged since our previous visit and doesn't feel any different since coming off the prednisone. Cough remains unchanged as well and is minimally productive. He does continue to have swelling in his lower extremities. He has been taking his lasix 40 mg Twice daily every other day and skipping a day in between. He has not contacted cardiology to set up a follow up appointment yet. He is anticipated to start pulmonary rehab tomorrow.   He did have ONO which showed  that he spent almost 7 minutes below 88% and average of 93%. Echocardiogram showed low normal EF. CT chest with emphysema but no acute process or suspicious nodules/masses.   No Known Allergies  Immunization History  Administered Date(s) Administered   Fluad Quad(high Dose 65+) 04/27/2019, 04/26/2020, 05/15/2021   H1N1 07/09/2008   Influenza Split 05/06/2012, 06/21/2015   Influenza Whole 05/23/2010, 05/07/2011   Influenza, High Dose Seasonal PF 05/23/2016, 04/14/2018   Influenza,inj,Quad PF,6+ Mos 05/01/2013, 05/07/2014   Influenza-Unspecified 04/01/2017   PFIZER(Purple Top)SARS-COV-2 Vaccination 09/27/2019, 10/21/2019, 07/04/2020   Pneumococcal Conjugate-13 03/09/2014   Pneumococcal Polysaccharide-23 05/23/2010, 06/23/2018   Zoster, Live 12/05/2014    Past Medical History:  Diagnosis Date   Anemia    Anxiety    Arthritis    CKD (chronic kidney disease), stage II    COPD (chronic obstructive pulmonary disease) ([place])    Coronary artery disease    a. s/p CABG 1999.   Depression    Diabetes mellitus    Diverticulosis    GERD (gastroesophageal reflux disease)    Hiatal hernia    History of echocardiogram    Echocardiogram 10/21: EF 60-65, no RWMA, Gr 1 DD, normal RVSF, RVSP 15.2    History of nuclear stress test    Myoview 10/21: EF 62, normal perfusion; low risk    Hypercholesteremia    Hypertension    Sleep apnea    had sleep study and negative per pt   Thyroid disease    Tobacco abuse    Tubular adenoma of colon     Tobacco History: Social History   Tobacco Use  Smoking Status Former   Packs/day: 2.00   Years: 35.00   Total pack years: 70.00   Types: Cigarettes   Quit date: 09/27/1997   Years since quitting: 24.3  Smokeless Tobacco Never   Counseling given: Not Answered   Outpatient Medications Prior to Visit  Medication Sig Dispense Refill   albuterol (PROAIR HFA) 108 (90 Base) MCG/ACT inhaler Inhale 2 puffs into the lungs every 4 (four) hours as needed  for wheezing or shortness of breath. 1 each 3   albuterol (PROVENTIL) (2.5 MG/3ML) 0.083% nebulizer solution Take 3 mLs (2.5 mg total) by nebulization every 6 (six) hours as needed for wheezing or shortness of breath. 360 mL 5   ALPRAZolam (XANAX) 0.5 MG tablet Take 0.5 mg by mouth at bedtime.      antiseptic oral rinse (BIOTENE) LIQD 15 mLs by Mouth Rinse route daily.  aspirin 81 MG tablet Take 81 mg by mouth at bedtime.      benzonatate (TESSALON) 200 MG capsule Take 1 capsule (200 mg total) by mouth 3 (three) times daily as needed for cough. 30 capsule 1   Coenzyme Q10 (CO Q 10) 100 MG CAPS Take 100 mg by mouth at bedtime.      colesevelam (WELCHOL) 625 MG tablet TAKE 3 TABLETS BY MOUTH TWICE DAILY WITH MEALS 540 tablet 2   dextromethorphan-guaiFENesin (MUCINEX DM) 30-600 MG 12hr tablet Take 1 tablet by mouth 2 (two) times daily. 60 tablet 0   doxepin (SINEQUAN) 50 MG capsule Take 100 mg by mouth at bedtime.      esomeprazole (NEXIUM) 20 MG capsule Take 20 mg by mouth 2 (two) times daily.     fluticasone (FLONASE) 50 MCG/ACT nasal spray Use 1 spray(s) in each nostril once daily 16 g 3   furosemide (LASIX) 40 MG tablet Take one tablet ('40mg'$ ) up to twice a day as needed for weight gain greater than 2 lbs overnight or 5 pounds in a week. 30 tablet 0   HUMALOG 100 UNIT/ML injection 80 Units.   6   hydrochlorothiazide (HYDRODIURIL) 25 MG tablet Take 1 tablet by mouth once daily 90 tablet 3   levothyroxine (SYNTHROID) 112 MCG tablet Take 1 tablet by mouth daily before breakfast.     loratadine (CLARITIN) 10 MG tablet Take 1 tablet (10 mg total) by mouth daily. 30 tablet 2   losartan (COZAAR) 100 MG tablet Take 1 tablet by mouth once daily 90 tablet 3   metFORMIN (GLUCOPHAGE) 500 MG tablet Take 1 tablet (500 mg total) by mouth 2 (two) times daily with a meal.     metoprolol succinate (TOPROL-XL) 50 MG 24 hr tablet TAKE 1 TABLET BY MOUTH ONCE DAILY WITH  OR  IMMEDIATELY  FOLLOWING  A  MEAL 90 tablet  3   Multiple Vitamins-Minerals (CENTRUM SILVER 50+MEN) TABS Take 1 tablet by mouth daily.     nitroGLYCERIN (NITROSTAT) 0.4 MG SL tablet Place 1 tablet (0.4 mg total) under the tongue every 5 (five) minutes as needed for chest pain. 25 tablet 3   Omega-3 Fatty Acids (FISH OIL) 600 MG CAPS Take 1,400 mg by mouth at bedtime.      pravastatin (PRAVACHOL) 40 MG tablet Take 1 tablet (40 mg total) by mouth daily. 90 tablet 3   predniSONE (DELTASONE) 10 MG tablet Take 10 mg daily for 7 days then decrease to 5 mg (1/2 tab) daily for 7 days then stop. 12 tablet 0   tamsulosin (FLOMAX) 0.4 MG CAPS capsule SMARTSIG:1 Capsule(s) By Mouth Every Evening     Tiotropium Bromide Monohydrate (SPIRIVA RESPIMAT) 2.5 MCG/ACT AERS INHALE 2 SPRAY(S) BY MOUTH ONCE DAILY 4 g 5   budesonide (PULMICORT) 0.5 MG/2ML nebulizer solution Take 2 mLs (0.5 mg total) by nebulization in the morning and at bedtime. 360 mL 3   SYMBICORT 160-4.5 MCG/ACT inhaler Inhale 2 puffs into the lungs 2 (two) times daily. 11 g 2   arformoterol (BROVANA) 15 MCG/2ML NEBU Take 2 mLs (15 mcg total) by nebulization 2 (two) times daily. (Patient not taking: Reported on 02/07/2022) 360 mL 3   revefenacin (YUPELRI) 175 MCG/3ML nebulizer solution Take 3 mLs (175 mcg total) by nebulization daily. (Patient not taking: Reported on 02/07/2022) 270 mL 3   Facility-Administered Medications Prior to Visit  Medication Dose Route Frequency Provider Last Rate Last Admin   0.9 %  sodium chloride infusion  500 mL Intravenous Continuous Milus Banister, MD         Review of Systems:   Constitutional: No weight loss or gain, night sweats, fevers, chills, fatigue, or lassitude. HEENT: No headaches, difficulty swallowing, tooth/dental problems, or sore throat. No sneezing, itching, ear ache, nasal congestion, or post nasal drip CV:  +swelling in bilateral lower extremities (unchanged). No chest pain, orthopnea, PND, anasarca, dizziness, palpitations, syncope Resp:  +shortness of breath with exertion (unchanged); chronic daily cough. No excess mucus or change in color of mucus. No productive or non-productive. No hemoptysis. No wheezing.  No chest wall deformity GI:  No heartburn, indigestion, abdominal pain, nausea, vomiting, diarrhea, change in bowel habits, loss of appetite, bloody stools.  Skin: No rash, lesions, ulcerations MSK:  No joint pain or swelling.  No decreased range of motion.  No back pain. Neuro: No dizziness or lightheadedness.  Psych: No depression or anxiety. Mood stable.     Physical Exam:  BP 136/74 (BP Location: Right Arm, Patient Position: Sitting, Cuff Size: Normal)   Pulse 92   Temp 98.3 F (36.8 C) (Oral)   Ht '5\' 9"'$  (1.753 m)   Wt 195 lb (88.5 kg)   SpO2 92%   BMI 28.80 kg/m   GEN: Pleasant, interactive, well-appearing; in no acute distress. HEENT:  Normocephalic and atraumatic. EACs patent bilaterally. TM pearly gray with present light reflex bilaterally. PERRLA. Sclera white. Nasal turbinates pink, moist and patent bilaterally. No rhinorrhea present. Oropharynx pink and moist, without exudate or edema. No lesions, ulcerations, or postnasal drip.  NECK:  Supple w/ fair ROM. No JVD present. Normal carotid impulses w/o bruits. Thyroid symmetrical with no goiter or nodules palpated. No lymphadenopathy.   CV: RRR, no m/r/g, +1 pitting edema BLE. Pulses intact, +2 bilaterally. No cyanosis, pallor or clubbing. PULMONARY:  Unlabored, regular breathing.  Diminished bilaterally A&P without wheezes/rales/rhonchi. No accessory muscle use. No dullness to percussion. GI: BS present and normoactive. Soft, non-tender to palpation. No organomegaly or masses detected. No CVA tenderness. MSK: No erythema, warmth or tenderness. Cap refil <2 sec all extrem. No deformities or joint swelling noted.  Neuro: A/Ox3. No focal deficits noted.   Skin: Warm, no lesions or rashe Psych: Normal affect and behavior. Judgement and thought content  appropriate.     Lab Results:  CBC    Component Value Date/Time   WBC 7.2 03/14/2021 1226   WBC 8.6 07/31/2018 1438   RBC 4.26 03/14/2021 1226   RBC 4.22 07/31/2018 1438   HGB 12.9 (L) 03/14/2021 1226   HCT 39.2 03/14/2021 1226   PLT 227 03/14/2021 1226   MCV 92 03/14/2021 1226   MCH 30.3 03/14/2021 1226   MCH 21.3 (L) 06/05/2016 1055   MCHC 32.9 03/14/2021 1226   MCHC 33.1 07/31/2018 1438   RDW 14.1 03/14/2021 1226   LYMPHSABS 2.0 07/31/2018 1438   MONOABS 1.1 (H) 07/31/2018 1438   EOSABS 0.2 07/31/2018 1438   BASOSABS 0.1 07/31/2018 1438    BMET    Component Value Date/Time   NA 137 01/23/2022 1158   NA 139 11/24/2021 1432   K 4.0 01/23/2022 1158   CL 99 01/23/2022 1158   CO2 30 01/23/2022 1158   GLUCOSE 177 (H) 01/23/2022 1158   BUN 22 01/23/2022 1158   BUN 18 11/24/2021 1432   CREATININE 1.22 01/23/2022 1158   CREATININE 1.26 (H) 06/05/2016 1055   CALCIUM 9.6 01/23/2022 1158   GFRNONAA 60 06/03/2020 1216   GFRAA 69 06/03/2020 1216  BNP No results found for: "BNP"   Imaging:  CT Chest Wo Contrast  Result Date: 02/02/2022 CLINICAL DATA:  Chronic dyspnea EXAM: CT CHEST WITHOUT CONTRAST TECHNIQUE: Multidetector CT imaging of the chest was performed following the standard protocol without IV contrast. RADIATION DOSE REDUCTION: This exam was performed according to the departmental dose-optimization program which includes automated exposure control, adjustment of the mA and/or kV according to patient size and/or use of iterative reconstruction technique. COMPARISON:  CT done on 09/01/2020, chest radiograph done on 01/24/2019 FINDINGS: Cardiovascular: Coronary artery calcifications are seen. There is evidence of previous coronary bypass surgery. There is ectasia of ascending thoracic aorta measuring 3.4 cm. Mediastinum/Nodes: No significant lymphadenopathy seen. Lungs/Pleura: Small linear densities in the apices may suggest scarring. Centrilobular emphysema is  seen. There is no focal pulmonary consolidation. There is tiny calcification in the left lower lung fields. There are no discrete lung nodules. Upper Abdomen: Small hiatal hernia is seen. There are small calcified granulomas in the spleen. Musculoskeletal: No acute findings are seen. IMPRESSION: No acute findings are seen. There are no discrete lung nodules. There is no focal pulmonary consolidation. Centrilobular emphysema is seen. Coronary artery calcifications are seen. There is evidence of previous coronary bypass surgery. Small hiatal hernia. No significant interval changes are noted. Electronically Signed   By: Elmer Picker M.D.   On: 02/02/2022 18:04   ECHOCARDIOGRAM COMPLETE  Result Date: 01/29/2022    ECHOCARDIOGRAM REPORT   Patient Name:   Oscar Robinson Date of Exam: 01/29/2022 Medical Rec #:  [id number]       Height:       69.0 in Accession #:    [id number]      Weight:       195.4 lb Date of Birth:  17-Sep-1945       BSA:          2.046 m Patient Age:    88 years        BP:           151/81 mmHg Patient Gender: M               HR:           83 bpm. Exam Location:  [facility] Procedure: 2D Echo, Cardiac Doppler, Color Doppler and Strain Analysis Indications:    R06.02. SOB  History:        Patient has prior history of Echocardiogram examinations, most                 recent 05/24/2020. Prior CABG, COPD and CKD,                 Signs/Symptoms:Edema; Risk Factors:Hypertension, Sleep Apnea and                 Diabetes.  Sonographer:    Marygrace Drought RCS Referring Phys: [id number] [place]  1. Left ventricular ejection fraction, by estimation, is 55 to 60%. The left ventricle has normal function. The left ventricle has no regional wall motion abnormalities. Left ventricular diastolic parameters were normal.  2. Right ventricular systolic function is normal. The right ventricular size is normal.  3. The mitral valve is normal in structure. Mild mitral valve regurgitation.  4. The  aortic valve is tricuspid. Aortic valve regurgitation is not visualized. Aortic valve sclerosis/calcification is present, without any evidence of aortic stenosis.  5. The inferior vena cava is normal in size with greater than 50% respiratory variability, suggesting right atrial  pressure of 3 mmHg. Comparison(s): The left ventricular function is unchanged. FINDINGS  Left Ventricle: Left ventricular ejection fraction, by estimation, is 55 to 60%. The left ventricle has normal function. The left ventricle has no regional wall motion abnormalities. The left ventricular internal cavity size was normal in size. There is  no left ventricular hypertrophy. Left ventricular diastolic parameters were normal. Right Ventricle: The right ventricular size is normal. Right vetricular wall thickness was not assessed. Right ventricular systolic function is normal. Left Atrium: Left atrial size was normal in size. Right Atrium: Right atrial size was normal in size. Pericardium: There is no evidence of pericardial effusion. Mitral Valve: The mitral valve is normal in structure. Mild mitral valve regurgitation. Tricuspid Valve: The tricuspid valve is normal in structure. Tricuspid valve regurgitation is trivial. Aortic Valve: The aortic valve is tricuspid. Aortic valve regurgitation is not visualized. Aortic valve sclerosis/calcification is present, without any evidence of aortic stenosis. Pulmonic Valve: The pulmonic valve was not well visualized. Pulmonic valve regurgitation is not visualized. No evidence of pulmonic stenosis. Aorta: The aortic root and ascending aorta are structurally normal, with no evidence of dilitation. Venous: The inferior vena cava is normal in size with greater than 50% respiratory variability, suggesting right atrial pressure of 3 mmHg. IAS/Shunts: No atrial level shunt detected by color flow Doppler.  LEFT VENTRICLE PLAX 2D LVIDd:         3.80 cm   Diastology LVIDs:         2.70 cm   LV e' medial:    9.68  cm/s LV PW:         1.10 cm   LV E/e' medial:  8.1 LV IVS:        0.90 cm   LV e' lateral:   9.57 cm/s LVOT diam:     2.10 cm   LV E/e' lateral: 8.2 LV SV:         66 LV SV Index:   32        2D Longitudinal Strain LVOT Area:     3.46 cm  2D Strain GLS (A2C):   -11.3 %                          2D Strain GLS (A3C):   -19.9 %                          2D Strain GLS (A4C):   -21.2 %                          2D Strain GLS Avg:     -17.5 % RIGHT VENTRICLE RV Basal diam:  3.50 cm RV S prime:     12.60 cm/s TAPSE (M-mode): 1.8 cm LEFT ATRIUM             Index        RIGHT ATRIUM           Index LA diam:        3.20 cm 1.56 cm/m   RA Area:     12.50 cm LA Vol (A2C):   31.9 ml 15.59 ml/m  RA Volume:   30.10 ml  14.71 ml/m LA Vol (A4C):   24.4 ml 11.93 ml/m LA Biplane Vol: 28.7 ml 14.03 ml/m  AORTIC VALVE LVOT Vmax:   98.20 cm/s LVOT Vmean:  65.300 cm/s LVOT VTI:  0.190 m  AORTA Ao Root diam: 3.40 cm Ao Asc diam:  3.30 cm MITRAL VALVE MV Area (PHT):             SHUNTS MV Decel Time:             Systemic VTI:  0.19 m MV E velocity: 78.60 cm/s  Systemic Diam: 2.10 cm MV A velocity: 74.40 cm/s MV E/A ratio:  1.06 Dorris Carnes MD Electronically signed by Dorris Carnes MD Signature Date/Time: 01/29/2022/6:35:32 PM    Final    DG Chest 2 View  Result Date: 01/23/2022 CLINICAL DATA:  Shortness of breath and bilateral lower extremity edema. EXAM: CHEST - 2 VIEW COMPARISON:  09/26/2021 FINDINGS: Previous median sternotomy and CABG procedure. Heart size is normal. The lungs are hyperinflated. Coarsened interstitial markings of emphysema noted. No pleural effusion, interstitial edema or airspace consolidation. The visualized osseous structures are unremarkable. IMPRESSION: No active cardiopulmonary abnormalities. Emphysema (ICD10-J43.9). Electronically Signed   By: Kerby Moors M.D.   On: 01/23/2022 12:20         Latest Ref Rng & Units 08/24/2020    1:34 PM 04/07/2014    2:52 PM  PFT Results  FVC-Pre L 1.93  2.49  P   FVC-Predicted Pre % 46  57  P  FVC-Post L 2.30  2.84  P  FVC-Predicted Post % 55  65  P  Pre FEV1/FVC % % 56  51  P  Post FEV1/FCV % % 55  56  P  FEV1-Pre L 1.09  1.28  P  FEV1-Predicted Pre % 35  39  P  FEV1-Post L 1.26  1.60  P  DLCO uncorrected ml/min/mmHg 14.42  14.73  P  DLCO UNC% % 58  47  P  DLCO corrected ml/min/mmHg 14.42    DLCO COR %Predicted % 58    DLVA Predicted % 95  57  P  TLC L 6.52  6.10  P  TLC % Predicted % 95  89  P  RV % Predicted % 138  145  P    P Preliminary result    Lab Results  Component Value Date   NITRICOXIDE 6 05/09/2016        Assessment & Plan:   GOLD COPD C Progressive decline and worsening DOE over the past few months.  He has not had a significant response to prednisone so was tapered off after month long trial. He does feel like he does better with nebulizer treatments than his inhalers so decision was made at previous OV to switch him to triple therapy nebs. He has yet to receive budesonide, which was on backorder through [facility] so we sent to local pharmacy today. He was able to get a spot in pulmonary rehab already and will start tomorrow. He did not have any desaturations on previous walking oximetry; ONO showed minimal amount of time spent at or below 88% (6 min 56 sec; average 93%%), which we discussed qualifies him for nocturnal O2 but he would like to hold off for now. CT without any suspicious nodules/masses or acute process.   Patient Instructions  -Continue Albuterol inhaler 2 puffs or 3 mL neb every 6 hours as needed for shortness of breath or wheezing. Notify if symptoms persist despite rescue inhaler/neb use. -Continue Nexium 20 Twice daily  -Continue flonase 1 spray each nostril daily -Continue claritin 10 mg daily for allergies  -Can continue mucinex DM Twice daily as needed for chest congestion or cough   Continue  Symbicort 2 puffs Twice daily. Brush tongue and rinse mouth afterwards Continue Spiriva 2 puffs  daily You will stop your Symbicort and Spiriva once you receive all or your nebulized medications then you will start... -Brovana 2 mL neb Twice daily  -Budesonide 2 mL neb Twice daily. Brush tongue and rinse mouth afterwards -Yupelri 3 mL daily    Take lasix 40 mg daily as prescribed. Oscar to take twice daily for leg swelling, 2 lbs overnight or 5 lbs in a week Follow up ASAP with cardiology regarding swelling in your legs  Continue pulmonary rehab    Follow up in three months with Oscar Robinson. If symptoms do not improve or worsen, please contact office for sooner follow up or seek emergency care.    Lower extremity edema Persistent BLE edema. Echo showed low normal EF and normal diastolic parameters. He is currently taking 40 mg of lasix Twice daily every other day and then skipping a day in between. I advised him to restart it how Dr. Marlou Porch prescribed, which was 40 mg daily and an additional dose base on weight. Check BMET today. He is going to contact cardiology today to schedule an appointment.    I spent 35 minutes of dedicated to the care of this patient on the date of this encounter to include pre-visit review of records, face-to-face time with the patient discussing conditions above, post visit ordering of testing, clinical documentation with the electronic health record, making appropriate referrals as documented, and communicating necessary findings to members of the patients care team.  Clayton Bibles, Robinson 02/07/2022  Pt aware and understands Robinson's role.

## 2022-02-08 NOTE — Telephone Encounter (Signed)
Patient is returning a call to [place].  Please call patient back at [phone]

## 2022-02-08 NOTE — Telephone Encounter (Signed)
Called and spoke with patient. He verbalized understanding of results. He wanted to go over the medications that were prescribed yesterday (nebulized medications). I explained to him how to use the medications and to make sure he D/Cs the Symbicort and Spiriva once he starts using the nebulized medications. He verbalized understanding.   Nothing further needed at time of call.

## 2022-02-08 NOTE — Telephone Encounter (Signed)
Left message for patient to call back  

## 2022-02-13 DIAGNOSIS — E119 Type 2 diabetes mellitus without complications: Secondary | ICD-10-CM | POA: Diagnosis not present

## 2022-02-13 DIAGNOSIS — L84 Corns and callosities: Secondary | ICD-10-CM | POA: Diagnosis not present

## 2022-02-13 DIAGNOSIS — R600 Localized edema: Secondary | ICD-10-CM | POA: Diagnosis not present

## 2022-02-15 ENCOUNTER — Other Ambulatory Visit: Payer: Self-pay | Admitting: Nurse Practitioner

## 2022-02-15 DIAGNOSIS — J449 Chronic obstructive pulmonary disease, unspecified: Secondary | ICD-10-CM

## 2022-02-17 NOTE — Progress Notes (Unsigned)
Office Visit    Patient Name: Oscar Robinson Date of Encounter: 02/17/2022  Primary Care Provider:  [name], MD Primary Cardiologist:  Candee Furbish, MD Primary Electrophysiologist: None  Chief Complaint    Oscar Robinson is a 76 y.o. male with PMH of CAD s/p CABG 1999, LIMA to LAD, DM, HLD, HTN, thyroid disease, COPD, obstructive sleep apnea, GERD, CKD stage II  Past Medical History    Past Medical History:  Diagnosis Date   Anemia    Anxiety    Arthritis    CKD (chronic kidney disease), stage II    COPD (chronic obstructive pulmonary disease) ([place])    Coronary artery disease    a. s/p CABG 1999.   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   Procedure: DUPUYTREN CONTRACTURE RELEASE;  Surgeon: Cammie Sickle., MD;  Location: [facility];  Service: Orthopedics;  Laterality: Right;  palm, ring and small fingers dupuytrens contracture release   HAND SURGERY     lt palm,ring finger,   HAND SURGERY Left    pinky finger   HERNIA REPAIR  1996   lt/rt ing hernia   LEFT HEART CATH AND CORS/GRAFTS ANGIOGRAPHY N/A 02/26/2018   Procedure: LEFT HEART CATH AND CORS/GRAFTS ANGIOGRAPHY;  Surgeon: Jettie Booze, MD;  Location: [facility];  Service: Cardiovascular;  Laterality: N/A;   LEFT HEART CATHETERIZATION WITH CORONARY/GRAFT ANGIOGRAM N/A 06/15/2013   Procedure: LEFT HEART  CATHETERIZATION WITH Beatrix Fetters;  Surgeon: Candee Furbish, MD;  Location: [facility];  Service: Cardiovascular;  Laterality: N/A;   UPPER GASTROINTESTINAL ENDOSCOPY      Allergies  No Known Allergies  History of Present Illness    Oscar Robinson is a 76 y.o. male with the above-mentioned past medical history who presents today for complaint of lower extremity edema.  He is simultaneously being followed by [place] pulmonology for severe COPD.  His last LHC was 02/2018 which revealed LAD/diagonal bifurcation disease with patent LIMA to LAD/diagonal albeit small caliber vessel and no change from previous cath.  He underwent Lexiscan Myoview on 05/2020 that was normal with no ischemia present. He was seen by [name] on 11/07/2021 with continued complaints of dyspnea and lower extremity edema. He was advised to take prednisone for 5 days and then switch back to every other day.  He was seen by me on 11/2021 for complaint of increased swelling in his lower extremities that developed following a recent cruise.  He admitted to sodium indiscretions and I increased his Lasix to 80 mg for 3 days with instructions to continue 80 mg if tolerated well and good result.  He was last seen by Dr. Marlou Porch on 12/2021 for follow-up and was doing well with  improved lower extremity edema.  He was advised to continue current dose of diuretic.  He was seen by pulmonology on 01/23/2022 with ongoing complaint of shortness of breath with minimal exertion. 2D echo was performed 01/2022 with EF of 55-60%, in RWMA, no LVH, normal LV function.  He was started on triple therapy for his COPD and referred to pulmonary rehab.  He was seen again at pulmonology on 02/2022 with no change in breathing and was advised to follow-up with cardiology due to steady swelling in his lower extremities.  Since last being seen in the office patient reports that he has been experiencing increased lower extremity edema that usually resolves with Lasix.   He notes that sometimes his Lasix does not work and he still has swelling following dose.  He has been compliant with his salt intake and is keeping his fluids under 64 ounces per day.  Patient denies chest pain, palpitations, dyspnea, PND, orthopnea, nausea, vomiting, dizziness, syncope, edema, weight gain, or early satiety.   Home Medications    Current Outpatient Medications  Medication Sig Dispense Refill   albuterol (PROAIR HFA) 108 (90 Base) MCG/ACT inhaler Inhale 2 puffs into the lungs every 4 (four) hours as needed for wheezing or shortness of breath. 1 each 3   albuterol (PROVENTIL) (2.5 MG/3ML) 0.083% nebulizer solution Take 3 mLs (2.5 mg total) by nebulization every 6 (six) hours as needed for wheezing or shortness of breath. 360 mL 5   ALPRAZolam (XANAX) 0.5 MG tablet Take 0.5 mg by mouth at bedtime.      antiseptic oral rinse (BIOTENE) LIQD 15 mLs by Mouth Rinse route daily.     arformoterol (BROVANA) 15 MCG/2ML NEBU Take 2 mLs (15 mcg total) by nebulization 2 (two) times daily. (Patient not taking: Reported on 02/07/2022) 360 mL 3   aspirin 81 MG tablet Take 81 mg by mouth at bedtime.      benzonatate (TESSALON) 200 MG capsule Take 1 capsule (200 mg total) by mouth 3 (three) times daily as needed for cough. 30 capsule 1   budesonide (PULMICORT) 0.5 MG/2ML nebulizer solution Take 2 mLs (0.5 mg total) by nebulization in the morning and at bedtime. 360 mL 3   Coenzyme Q10 (CO Q 10) 100 MG CAPS Take 100 mg by mouth at bedtime.      colesevelam (WELCHOL) 625 MG tablet TAKE 3 TABLETS BY MOUTH TWICE DAILY WITH MEALS 540 tablet 2   dextromethorphan-guaiFENesin (MUCINEX DM) 30-600 MG 12hr tablet Take 1 tablet by mouth 2 (two) times daily. 60 tablet 0   doxepin (SINEQUAN) 50 MG capsule Take 100 mg by mouth at bedtime.      esomeprazole (NEXIUM) 20 MG capsule Take 20 mg by mouth 2 (two) times daily.     fluticasone (FLONASE) 50 MCG/ACT nasal spray Use 1 spray(s) in each nostril once daily 16 g 3    furosemide (LASIX) 40 MG tablet Take one tablet ('40mg'$ ) up to twice a day as needed for weight gain greater than 2 lbs overnight or 5 pounds in a week. 30 tablet 0   HUMALOG 100 UNIT/ML injection 80 Units.   6   hydrochlorothiazide (HYDRODIURIL) 25 MG tablet Take 1 tablet by mouth once daily 90 tablet 3   levothyroxine (SYNTHROID) 112 MCG tablet Take 1 tablet by mouth daily before breakfast.     loratadine (CLARITIN) 10 MG tablet Take 1 tablet (10 mg total) by mouth daily. 30 tablet 2   losartan (COZAAR) 100 MG tablet Take  1 tablet by mouth once daily 90 tablet 3   metFORMIN (GLUCOPHAGE) 500 MG tablet Take 1 tablet (500 mg total) by mouth 2 (two) times daily with a meal.     metoprolol succinate (TOPROL-XL) 50 MG 24 hr tablet TAKE 1 TABLET BY MOUTH ONCE DAILY WITH  OR  IMMEDIATELY  FOLLOWING  A  MEAL 90 tablet 3   Multiple Vitamins-Minerals (CENTRUM SILVER 50+MEN) TABS Take 1 tablet by mouth daily.     nitroGLYCERIN (NITROSTAT) 0.4 MG SL tablet Place 1 tablet (0.4 mg total) under the tongue every 5 (five) minutes as needed for chest pain. 25 tablet 3   Omega-3 Fatty Acids (FISH OIL) 600 MG CAPS Take 1,400 mg by mouth at bedtime.      pravastatin (PRAVACHOL) 40 MG tablet Take 1 tablet (40 mg total) by mouth daily. 90 tablet 3   predniSONE (DELTASONE) 10 MG tablet Take 10 mg daily for 7 days then decrease to 5 mg (1/2 tab) daily for 7 days then stop. 12 tablet 0   revefenacin (YUPELRI) 175 MCG/3ML nebulizer solution Take 3 mLs (175 mcg total) by nebulization daily. (Patient not taking: Reported on 02/07/2022) 270 mL 3   SYMBICORT 160-4.5 MCG/ACT inhaler Inhale 2 puffs by mouth twice daily 11 g 5   tamsulosin (FLOMAX) 0.4 MG CAPS capsule SMARTSIG:1 Capsule(s) By Mouth Every Evening     Tiotropium Bromide Monohydrate (SPIRIVA RESPIMAT) 2.5 MCG/ACT AERS INHALE 2 SPRAY(S) BY MOUTH ONCE DAILY 4 g 5   Current Facility-Administered Medications  Medication Dose Route Frequency Provider Last Rate Last Admin    0.9 %  sodium chloride infusion  500 mL Intravenous Continuous Milus Banister, MD         Review of Systems  Please see the history of present illness.    (+) Lower extremity swelling (+) Chronic shortness of breath  All other systems reviewed and are otherwise negative except as noted above.  Physical Exam    Wt Readings from Last 3 Encounters:  02/07/22 195 lb (88.5 kg)  01/23/22 195 lb 6.4 oz (88.6 kg)  12/20/21 189 lb 9.6 oz (86 kg)   JS:EGBTD were no vitals filed for this visit.,There is no height or weight on file to calculate BMI.  Constitutional:      Appearance: Healthy appearance. Not in distress.  Neck:     Vascular: JVD normal.  Pulmonary:     Effort: Pulmonary effort is normal.     Breath sounds: No wheezing.  Diminished in the bases Cardiovascular:     Normal rate. Regular rhythm. Normal S1. Normal S2.      Murmurs: There is no murmur.  Edema:    Peripheral edema 2+ bilaterally  Abdominal:     Palpations: Abdomen is soft non tender. There is no hepatomegaly.  Skin:    General: Skin is warm and dry.  Neurological:     General: No focal deficit present.     Mental Status: Alert and oriented to person, place and time.     Cranial Nerves: Cranial nerves are intact.  EKG/LABS/Other Studies Reviewed    ECG personally reviewed by me today -none completed today  Lab Results  Component Value Date   WBC 7.2 03/14/2021   HGB 12.9 (L) 03/14/2021   HCT 39.2 03/14/2021   MCV 92 03/14/2021   PLT 227 03/14/2021   Lab Results  Component Value Date   CREATININE 1.22 01/23/2022   BUN 22 01/23/2022   NA 137 01/23/2022  K 4.0 01/23/2022   CL 99 01/23/2022   CO2 30 01/23/2022   Lab Results  Component Value Date   ALT 20 03/14/2021   AST 22 03/14/2021   ALKPHOS 65 03/14/2021   BILITOT 0.3 03/14/2021   Lab Results  Component Value Date   CHOL 143 03/14/2021   HDL 57 03/14/2021   LDLCALC 67 03/14/2021   TRIG 107 03/14/2021   CHOLHDL 2.5 03/14/2021     Lab Results  Component Value Date   HGBA1C 8.2 (H) 01/05/2020    Assessment & Plan    1.  HFpEF/ Lower extremity edema: -Patient is slightly volume up today today on examination -Patient was taking Lasix 40 mg twice daily every other day instead of 40 mg daily with additional based on his weight.  He also states that Lasix sometimes does not work for his swelling -We will have him stop Lasix and start torsemide 20 mg daily -He will increase torsemide to 40 mg twice daily for 3 days and then return to 20 mg daily -BMET in 1 week -Low sodium diet, fluid restriction <2L, and daily weights encouraged. Educated to contact our office for weight gain of 2 lbs overnight or 5 lbs in one week.  -Encouraged to continue wearing compression stockings and elevate extremities when dependent -Patient may benefit from referral to vascular if edema is not improved with new diuretic regimen  2.  COPD: -Patient recently seen by [name], no medication changes at that time continue current therapies per pulmonology -Patient currently on triple therapy for COPD  3.  Coronary artery disease - s/p CABG 1999 -patient reports that he is free of any anginal symptoms and denies any exertional chest discomfort -Current GDMT consist of Toprol 50 mg daily, ASA 81 mg daily, pravastatin 40 mg daily, WelChol 625 mg twice daily   4. DM2: -Follows closely with PCP and monitors his blood sugars daily  Disposition: Follow-up with Candee Furbish, MD or APP in 2 months   Medication Adjustments/Labs and Tests Ordered: Current medicines are reviewed at length with the patient today.  Concerns regarding medicines are outlined above.   Signed, Mable Fill, Marissa Nestle, NP 02/17/2022, 3:20 PM [place]

## 2022-02-21 ENCOUNTER — Ambulatory Visit (INDEPENDENT_AMBULATORY_CARE_PROVIDER_SITE_OTHER): Payer: Medicare Other | Admitting: Nurse Practitioner

## 2022-02-21 ENCOUNTER — Encounter: Payer: Self-pay | Admitting: Nurse Practitioner

## 2022-02-21 VITALS — BP 132/80 | HR 84 | Ht 69.0 in | Wt 191.6 lb

## 2022-02-21 DIAGNOSIS — N182 Chronic kidney disease, stage 2 (mild): Secondary | ICD-10-CM | POA: Diagnosis not present

## 2022-02-21 DIAGNOSIS — R600 Localized edema: Secondary | ICD-10-CM

## 2022-02-21 MED ORDER — TORSEMIDE 20 MG PO TABS: 20.0000 mg | ORAL_TABLET | Freq: Every day | ORAL | 0 refills | Status: DC

## 2022-02-21 NOTE — Patient Instructions (Signed)
Medication Instructions:  STOP FUROSEMIDE  START TORSEMIDE  40 MG FOR 3 DAYS THEN DECREASE TO 20 MG DAILY  *If you need a refill on your cardiac medications before your next appointment, please call your pharmacy*   Lab Work: BMET IN 1 WEEK If you have labs (blood work) drawn today and your tests are completely normal, you will receive your results only by: [place] (if you have MyChart) OR A paper copy in the mail If you have any lab test that is abnormal or we need to change your treatment, we will call you to review the results.   Testing/Procedures: NONE   Follow-Up: At [facility], you and your health needs are our priority.  As part of our continuing mission to provide you with exceptional heart care, we have created designated Provider Care Teams.  These Care Teams include your primary Cardiologist (physician) and Advanced Practice Providers (APPs -  Physician Assistants and Nurse Practitioners) who all work together to provide you with the care you need, when you need it.  We recommend signing up for the patient portal called "MyChart".  Sign up information is provided on this After Visit Summary.  MyChart is used to connect with patients for Virtual Visits (Telemedicine).  Patients are able to view lab/test results, encounter notes, upcoming appointments, etc.  Non-urgent messages can be sent to your provider as well.   To learn more about what you can do with MyChart, go to [email or web address].    Your next appointment:   2 month(s)  The format for your next appointment:   In Person  Provider:   Ambrose Pancoast, NP         Other Instructions NONE  Important Information About Sugar

## 2022-02-23 ENCOUNTER — Telehealth: Payer: Self-pay | Admitting: Nurse Practitioner

## 2022-02-23 DIAGNOSIS — S61252A Open bite of right middle finger without damage to nail, initial encounter: Secondary | ICD-10-CM | POA: Diagnosis not present

## 2022-02-23 DIAGNOSIS — Z23 Encounter for immunization: Secondary | ICD-10-CM | POA: Diagnosis not present

## 2022-02-23 DIAGNOSIS — S61451A Open bite of right hand, initial encounter: Secondary | ICD-10-CM | POA: Diagnosis not present

## 2022-02-23 NOTE — Telephone Encounter (Signed)
Oscar Robinson,  Patient is wanting a note for him to take a nebulizer machine on a plane. I advised him that a POC would not be helpful for him.  Please advise

## 2022-02-26 ENCOUNTER — Other Ambulatory Visit: Payer: Self-pay | Admitting: Nurse Practitioner

## 2022-02-26 DIAGNOSIS — J449 Chronic obstructive pulmonary disease, unspecified: Secondary | ICD-10-CM

## 2022-02-26 MED ORDER — PREDNISONE 10 MG PO TABS: ORAL_TABLET | ORAL | 0 refills | Status: DC

## 2022-02-26 NOTE — Telephone Encounter (Signed)
Called patient and he states is it possible for him to have a course of prednisone just in case something was to happen while he is in [place]. He states he will be in [place] for 2 weeks.  Please advise Joellen Jersey

## 2022-02-26 NOTE — Telephone Encounter (Signed)
Called and left voicemail for patient to call office back  

## 2022-02-26 NOTE — Telephone Encounter (Signed)
He should be able to take the machine and medications on the plane. TSA allows medications, regardless of fluid ounces. If he needs a note, we can provide him with one.

## 2022-02-26 NOTE — Telephone Encounter (Signed)
Yes, I will send a taper for him to have on hand to take on his trip. Please advise him to take all nebulized medications and rescue inhaler with him. Thanks.

## 2022-02-26 NOTE — Telephone Encounter (Signed)
Called and spoke to patient and told him the prednisone taper pack was sent into his pharmacy for him to have while in [place]. And that his letter will be upfront ready for pick up. Nothing further needed

## 2022-02-28 ENCOUNTER — Other Ambulatory Visit: Payer: Medicare Other

## 2022-02-28 DIAGNOSIS — N182 Chronic kidney disease, stage 2 (mild): Secondary | ICD-10-CM | POA: Diagnosis not present

## 2022-02-28 DIAGNOSIS — R600 Localized edema: Secondary | ICD-10-CM | POA: Diagnosis not present

## 2022-03-01 ENCOUNTER — Telehealth: Payer: Self-pay | Admitting: Cardiology

## 2022-03-01 DIAGNOSIS — R600 Localized edema: Secondary | ICD-10-CM

## 2022-03-01 DIAGNOSIS — N182 Chronic kidney disease, stage 2 (mild): Secondary | ICD-10-CM

## 2022-03-01 LAB — BASIC METABOLIC PANEL
BUN/Creatinine Ratio: 11 (ref 10–24)
BUN: 21 mg/dL (ref 8–27)
CO2: 25 mmol/L (ref 20–29)
Calcium: 9.2 mg/dL (ref 8.6–10.2)
Chloride: 95 mmol/L — ABNORMAL LOW (ref 96–106)
Creatinine, Ser: 1.86 mg/dL — ABNORMAL HIGH (ref 0.76–1.27)
Glucose: 108 mg/dL — ABNORMAL HIGH (ref 70–99)
Potassium: 3.1 mmol/L — ABNORMAL LOW (ref 3.5–5.2)
Sodium: 139 mmol/L (ref 134–144)
eGFR: 37 mL/min/{1.73_m2} — ABNORMAL LOW (ref >59)

## 2022-03-01 MED ORDER — POTASSIUM CHLORIDE ER 10 MEQ PO TBCR: EXTENDED_RELEASE_TABLET | ORAL | 3 refills | Status: DC

## 2022-03-01 NOTE — Telephone Encounter (Signed)
-----   Message from Marylu Lund., NP sent at 03/01/2022  8:26 AM EDT ----- Please have Oscar Robinson hold his torsemide for 2 days and then resume at 20 mg daily.  Please order potassium 20 mEq QD and have him take 20 mEq for two days only and then switch to  half a tab 10 mEq daily.  Repeat BMET in two weeks, please let me know if you have any questions.  Thanks, Ambrose Pancoast, NP

## 2022-03-01 NOTE — Telephone Encounter (Signed)
The patient has been notified of the result and verbalized understanding.  All questions (if any) were answered. Precious Gilding, RN 03/01/2022 2:54 PM    Pt wrote down instructions and read back to nurse.  Lab OV 03/16/22.  No questions or concerns voiced.

## 2022-03-01 NOTE — Telephone Encounter (Signed)
Pt is calling back for results. Requesting call back.  

## 2022-03-02 DIAGNOSIS — S61203A Unspecified open wound of left middle finger without damage to nail, initial encounter: Secondary | ICD-10-CM | POA: Diagnosis not present

## 2022-03-06 ENCOUNTER — Telehealth: Payer: Self-pay | Admitting: *Deleted

## 2022-03-06 NOTE — Telephone Encounter (Signed)
Patient was returning call but he is on his way out town and wont be back till the 9th. He stated he will call back once he return on the 9th. Please advise

## 2022-03-06 NOTE — Telephone Encounter (Signed)
Pt has appt 03/23/22 with Dr. Marlou Porch for pre op clearance.  I will update the requesting office as well.

## 2022-03-06 NOTE — Telephone Encounter (Signed)
Our office received duplicate clearance request today. In review of this I did d/w the pre op providers today, Coletta Memos, FNP and Ambrose Pancoast, NP if the pt has been cleared as he was recently seen by Ambrose Pancoast, NP.   Per both pre op providers since the pt had undergone some cardiac medication changes at the most recent appt, that it was felt that we need to have him come in for a follow. The appt in regard to medication adjustment is indeed set for 04/2022 as a 2 month f/u. I did confer with the pre op providers that a sooner appt is necessary.   I left a message for the pt to call back to schedule a sooner appt for pre op clearance per pre op providers.   I will update the requesting office to this as well.   Once we have cleared the pt we will fax over the clearance notes.

## 2022-03-16 ENCOUNTER — Other Ambulatory Visit: Payer: Medicare Other

## 2022-03-16 DIAGNOSIS — R5383 Other fatigue: Secondary | ICD-10-CM | POA: Diagnosis not present

## 2022-03-16 DIAGNOSIS — R0600 Dyspnea, unspecified: Secondary | ICD-10-CM | POA: Diagnosis not present

## 2022-03-16 DIAGNOSIS — M791 Myalgia, unspecified site: Secondary | ICD-10-CM | POA: Diagnosis not present

## 2022-03-16 DIAGNOSIS — R059 Cough, unspecified: Secondary | ICD-10-CM | POA: Diagnosis not present

## 2022-03-21 ENCOUNTER — Telehealth: Payer: Self-pay | Admitting: Emergency Medicine

## 2022-03-21 DIAGNOSIS — R0602 Shortness of breath: Secondary | ICD-10-CM

## 2022-03-21 NOTE — Telephone Encounter (Signed)
Called patient Tested positive for covid for covid on 03/16/2022. Pt states that he always has SOB and that seems to be normal for him and his health. But he states that over all he is doing ok. He states he will come in tomorrow afternoon for his chest xray. I advised patient that we will call him tomorrow for results of the chest xray. Nothing further needed

## 2022-03-21 NOTE — Telephone Encounter (Signed)
When did he test positive/start having symptoms? Is he having increased shortness of breath or only the persistent cough? He likely needs a CXR to ensure he hasn't developed pneumonia. He can come in tomorrow to have one done if there are no openings.

## 2022-03-21 NOTE — Telephone Encounter (Signed)
Called patient and he states that when he came back from [place] he caught covid and he has been done and finished with his covid medication. But he states that he still does have some chest congestion, coughing with green to drown mucus. No fevers. He is stating if its ok for him to take the prednisone that we ordered for him to have while in [place].   He also states he went back on his Symbicort and Sprivia inhalers.  Please advise Joellen Jersey

## 2022-03-22 ENCOUNTER — Ambulatory Visit (INDEPENDENT_AMBULATORY_CARE_PROVIDER_SITE_OTHER): Payer: Medicare Other

## 2022-03-22 DIAGNOSIS — R0602 Shortness of breath: Secondary | ICD-10-CM

## 2022-03-22 DIAGNOSIS — R059 Cough, unspecified: Secondary | ICD-10-CM | POA: Diagnosis not present

## 2022-03-22 DIAGNOSIS — J449 Chronic obstructive pulmonary disease, unspecified: Secondary | ICD-10-CM | POA: Diagnosis not present

## 2022-03-23 ENCOUNTER — Ambulatory Visit: Payer: Medicare Other | Admitting: Cardiology

## 2022-03-26 NOTE — Progress Notes (Signed)
Please notify patient no evidence of pneumonia on his chest x ray. He does appear to have some bronchitis. He can go ahead and take the prednisone taper that he has on hand, if he has not already. Take as directed and in AM with food. Thanks.

## 2022-04-10 DIAGNOSIS — E1165 Type 2 diabetes mellitus with hyperglycemia: Secondary | ICD-10-CM | POA: Diagnosis not present

## 2022-04-10 DIAGNOSIS — E02 Subclinical iodine-deficiency hypothyroidism: Secondary | ICD-10-CM | POA: Diagnosis not present

## 2022-04-10 DIAGNOSIS — M2041 Other hammer toe(s) (acquired), right foot: Secondary | ICD-10-CM | POA: Diagnosis not present

## 2022-04-10 DIAGNOSIS — E119 Type 2 diabetes mellitus without complications: Secondary | ICD-10-CM | POA: Diagnosis not present

## 2022-04-17 DIAGNOSIS — E049 Nontoxic goiter, unspecified: Secondary | ICD-10-CM | POA: Diagnosis not present

## 2022-04-17 DIAGNOSIS — E1165 Type 2 diabetes mellitus with hyperglycemia: Secondary | ICD-10-CM | POA: Diagnosis not present

## 2022-04-17 DIAGNOSIS — M19049 Primary osteoarthritis, unspecified hand: Secondary | ICD-10-CM | POA: Diagnosis not present

## 2022-04-17 DIAGNOSIS — Z9641 Presence of insulin pump (external) (internal): Secondary | ICD-10-CM | POA: Diagnosis not present

## 2022-04-17 DIAGNOSIS — I10 Essential (primary) hypertension: Secondary | ICD-10-CM | POA: Diagnosis not present

## 2022-04-17 DIAGNOSIS — E7800 Pure hypercholesterolemia, unspecified: Secondary | ICD-10-CM | POA: Diagnosis not present

## 2022-04-17 DIAGNOSIS — E02 Subclinical iodine-deficiency hypothyroidism: Secondary | ICD-10-CM | POA: Diagnosis not present

## 2022-04-23 NOTE — Progress Notes (Unsigned)
Office Visit    Patient Name: Oscar Robinson Date of Encounter: 04/25/2022  Primary Care Provider:  [name], MD Primary Cardiologist:  Candee Furbish, MD Primary Electrophysiologist: None  Chief Complaint   Oscar Robinson is a 76 y.o. male with PMH of CAD s/p CABG 1999, LIMA to LAD, DM, HLD, HTN, thyroid disease, COPD, obstructive sleep apnea, GERD, CKD stage II who presents today for 41-monthfollow-up of CHF.  Past Medical History    Past Medical History:  Diagnosis Date   Anemia    Anxiety    Arthritis    CKD (chronic kidney disease), stage II    COPD (chronic obstructive pulmonary disease) (HCC)    Coronary artery disease    a. s/p CABG 1999.   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   Procedure: DUPUYTREN CONTRACTURE RELEASE;  Surgeon: RCammie Sickle, MD;  [place] [facility]  Service: Orthopedics;  Laterality: Right;  palm, ring and small fingers dupuytrens contracture release   HAND SURGERY     lt palm,ring finger,   HAND SURGERY Left    pinky finger   HERNIA REPAIR  1996   lt/rt ing hernia   LEFT HEART CATH AND CORS/GRAFTS ANGIOGRAPHY N/A 02/26/2018   Procedure: LEFT HEART CATH AND CORS/GRAFTS ANGIOGRAPHY;  Surgeon: VJettie Booze MD;  [facility];  Service: Cardiovascular;  Laterality: N/A;   LEFT HEART CATHETERIZATION WITH CORONARY/GRAFT  ANGIOGRAM N/A 06/15/2013   Procedure: LEFT HEART CATHETERIZATION WITH CBeatrix Fetters  Surgeon: MCandee Furbish MD;  [facility];  Service: Cardiovascular;  Laterality: N/A;   UPPER GASTROINTESTINAL ENDOSCOPY      Allergies  No Known Allergies  History of Present Illness    Oscar PETTINGERis a 76y.o. male with the above-mentioned past medical history who presents today for complaint of lower extremity edema.  He is simultaneously being [facility] for severe COPD.  His last LHC was 02/2018 which revealed LAD/diagonal bifurcation disease with patent LIMA to LAD/diagonal albeit small caliber vessel and no change from previous cath.  He underwent Lexiscan Myoview on 05/2020 that was normal with no ischemia present. He was seen by Dr. BLamonte Sakaion 11/07/2021 with continued complaints of dyspnea and lower extremity edema. He was advised to take prednisone for 5 days and then switch back to every other day.  He was seen by me on 11/2021 for complaint of increased swelling in his lower extremities that developed following a recent cruise.  He admitted to sodium indiscretions and I increased his Lasix to 80 mg for 3 days with instructions to continue 80 mg if tolerated well and good result.   He was last seen by Dr. SMarlou Porchon  12/2021 for follow-up and was doing well with improved lower extremity edema.  He was advised to continue current dose of diuretic.  He was seen by pulmonology on 01/23/2022 with ongoing complaint of shortness of breath with minimal exertion. 2D echo was performed 01/2022 with EF of 55-60%, in RWMA, no LVH, normal LV function.  He was started on triple therapy for his COPD and referred to pulmonary rehab.  He was seen again at pulmonology on 02/2022 with no change in breathing and was advised to follow-up with cardiology due to steady swelling in his lower extremities.   Oscar Robinson reports today alone for follow-up.  Since last being seen in the office patient  reports that he is been doing well with no chest discomfort or extreme shortness of breath at rest.  He does note some swimmy headedness that occurred twice in the last 2 months that he feels was associated with possible dehydration.  He denies any palpitations or chest discomfort with these episodes.  He is euvolemic on examination today and is tolerating his current medications without any adverse reactions.  He was diagnosed with COVID in July after a trip to [place].  He is doing much better completed Paxlovid with no residual effects.  Patient denies chest pain, palpitations, dyspnea, PND, orthopnea, nausea, vomiting, dizziness, syncope, edema, weight gain, or early satiety.  Home Medications    Current Outpatient Medications  Medication Sig Dispense Refill   albuterol (PROAIR HFA) 108 (90 Base) MCG/ACT inhaler Inhale 2 puffs into the lungs every 4 (four) hours as needed for wheezing or shortness of breath. 1 each 3   albuterol (PROVENTIL) (2.5 MG/3ML) 0.083% nebulizer solution Take 3 mLs (2.5 mg total) by nebulization every 6 (six) hours as needed for wheezing or shortness of breath. 360 mL 5   ALPRAZolam (XANAX) 0.5 MG tablet Take 0.5 mg by mouth at bedtime.      antiseptic oral rinse (BIOTENE) LIQD 15 mLs by Mouth Rinse route daily.     arformoterol (BROVANA) 15 MCG/2ML NEBU Take 2 mLs (15 mcg total) by nebulization 2 (two) times daily. 360 mL 3   aspirin 81 MG tablet Take 81 mg by mouth at bedtime.      benzonatate (TESSALON) 200 MG capsule Take 1 capsule (200 mg total) by mouth 3 (three) times daily as needed for cough. 30 capsule 1   budesonide (PULMICORT) 0.5 MG/2ML nebulizer solution Take 2 mLs (0.5 mg total) by nebulization in the morning and at bedtime. 360 mL 3   Coenzyme Q10 (CO Q 10) 100 MG CAPS Take 100 mg by mouth at bedtime.      colesevelam (WELCHOL) 625 MG tablet TAKE 3 TABLETS BY MOUTH TWICE DAILY WITH MEALS 540 tablet 2   dextromethorphan-guaiFENesin (MUCINEX DM) 30-600 MG  12hr tablet Take 1 tablet by mouth 2 (two) times daily. 60 tablet 0   doxepin (SINEQUAN) 50 MG capsule Take 100 mg by mouth at bedtime.      esomeprazole (NEXIUM) 20 MG capsule Take 20 mg by mouth 2 (two) times daily.     fluticasone (FLONASE) 50 MCG/ACT nasal spray Use 1 spray(s) in each nostril once daily 16 g 3   HUMALOG 100 UNIT/ML injection 80 Units.   6   hydrochlorothiazide (HYDRODIURIL) 25 MG tablet Take 1 tablet by mouth once daily 90 tablet 3   levothyroxine (SYNTHROID) 112 MCG tablet Take 1 tablet by mouth daily before breakfast.     loratadine (CLARITIN) 10 MG tablet Take 1  tablet (10 mg total) by mouth daily. 30 tablet 2   losartan (COZAAR) 100 MG tablet Take 1 tablet by mouth once daily 90 tablet 3   metFORMIN (GLUCOPHAGE) 500 MG tablet Take 1 tablet (500 mg total) by mouth 2 (two) times daily with a meal.     metoprolol succinate (TOPROL-XL) 50 MG 24 hr tablet TAKE 1 TABLET BY MOUTH ONCE DAILY WITH  OR  IMMEDIATELY  FOLLOWING  A  MEAL 90 tablet 3   Multiple Vitamins-Minerals (CENTRUM SILVER 50+MEN) TABS Take 1 tablet by mouth daily.     nitroGLYCERIN (NITROSTAT) 0.4 MG SL tablet Place 1 tablet (0.4 mg total) under the tongue every 5 (five) minutes as needed for chest pain. 25 tablet 3   Omega-3 Fatty Acids (FISH OIL) 600 MG CAPS Take 1,400 mg by mouth at bedtime.      potassium chloride (KLOR-CON) 10 MEQ tablet Take 20 mEq for 2 days then 10 mEq daily 90 tablet 3   pravastatin (PRAVACHOL) 40 MG tablet Take 1 tablet (40 mg total) by mouth daily. 90 tablet 3   revefenacin (YUPELRI) 175 MCG/3ML nebulizer solution Take 3 mLs (175 mcg total) by nebulization daily. 270 mL 3   SYMBICORT 160-4.5 MCG/ACT inhaler Inhale 2 puffs by mouth twice daily 11 g 5   tamsulosin (FLOMAX) 0.4 MG CAPS capsule SMARTSIG:1 Capsule(s) By Mouth Every Evening     Tiotropium Bromide Monohydrate (SPIRIVA RESPIMAT) 2.5 MCG/ACT AERS INHALE 2 SPRAY(S) BY MOUTH ONCE DAILY 4 g 5   torsemide (DEMADEX) 20 MG tablet  Take 1 tablet (20 mg total) by mouth daily. TAKE 40 MG FOR 3 DAYS THEN DECREASE TO 20 MG DAILY 90 tablet 0   predniSONE (DELTASONE) 10 MG tablet For COPD exacerbation. 4 tabs for 2 days, then 3 tabs for 2 days, 2 tabs for 2 days, then 1 tab for 2 days, then stop. 20 tablet 0   Current Facility-Administered Medications  Medication Dose Route Frequency Provider Last Rate Last Admin   0.9 %  sodium chloride infusion  500 mL Intravenous Continuous Milus Banister, MD         Review of Systems  Please see the history of present illness.    (+ Chronic shortness of breath) (+) Presyncope  All other systems reviewed and are otherwise negative except as noted above.  Physical Exam    Wt Readings from Last 3 Encounters:  04/25/22 185 lb 12.8 oz (84.3 kg)  02/21/22 191 lb 9.6 oz (86.9 kg)  02/07/22 195 lb (88.5 kg)   VS: Vitals:   04/25/22 1534  BP: 128/74  Pulse: 83  SpO2: 95%  ,Body mass index is 27.44 kg/m.  Constitutional:      Appearance: Healthy appearance. Not in distress.  Neck:     Vascular: JVD normal.  Pulmonary:     Effort: Pulmonary effort is normal.     Breath sounds: No wheezing. No rales. Diminished in the bases Cardiovascular:     Normal rate. Regular rhythm. Normal S1. Normal S2.      Murmurs: There is no murmur.  Edema:    Trace peripheral edema Abdominal:     Palpations: Abdomen is soft non tender. There is no hepatomegaly.  Skin:    General: Skin is warm and dry.  Neurological:     General: No focal deficit present.     Mental Status: Alert and oriented to person, place and time.     Cranial Nerves: Cranial nerves are intact.  EKG/LABS/Other Studies Reviewed    ECG personally reviewed by me today -none completed today     Lab Results  Component Value Date   WBC 7.2 03/14/2021   HGB 12.9 (L) 03/14/2021   HCT 39.2 03/14/2021   MCV 92 03/14/2021   PLT 227 03/14/2021   Lab Results  Component Value Date   CREATININE 1.86 (H) 02/28/2022   BUN 21  02/28/2022   NA 139 02/28/2022   K 3.1 (L) 02/28/2022   CL 95 (L) 02/28/2022   CO2 25 02/28/2022   Lab Results  Component Value Date   ALT 20 03/14/2021   AST 22 03/14/2021   ALKPHOS 65 03/14/2021   BILITOT 0.3 03/14/2021   Lab Results  Component Value Date   CHOL 143 03/14/2021   HDL 57 03/14/2021   LDLCALC 67 03/14/2021   TRIG 107 03/14/2021   CHOLHDL 2.5 03/14/2021    Lab Results  Component Value Date   HGBA1C 8.2 (H) 01/05/2020    Assessment & Plan    1.  HFpEF/ Lower extremity edema: -Patient's last 2D echo was completed 01/2022 with an EF of 60% normal RV function with mild MV regurgitation -Today patient is euvolemic on examination and is tolerating torsemide without any adverse reactions --Low sodium diet, fluid restriction <2L, and daily weights encouraged. Educated to contact our office for weight gain of 2 lbs overnight or 5 lbs in one week.  -Encouraged to continue wearing compression stockings and elevate extremities when dependent   2.  COPD: -Patient recently seen by [name], no medication changes at that time continue current therapies per pulmonology -Patient currently on triple therapy for COPD   3.  Coronary artery disease: - s/p CABG 1999 -patient reports that he is free of any anginal symptoms and denies any exertional chest discomfort -Current GDMT consist of Toprol 50 mg daily, ASA 81 mg daily, pravastatin 40 mg daily, WelChol 625 mg twice daily  4.  DM type II: -Follows closely with PCP and monitors his blood sugars daily  5.  CKD stage III: -Patient's most recent creatinine was 1.22 and GFR was less than 60 and 58.20     Disposition: Follow-up with Candee Furbish, MD or APP in 6 months    Medication Adjustments/Labs and Tests Ordered: Current medicines are reviewed at length with the patient today.  Concerns regarding medicines are outlined above.   Signed, Mable Fill, Marissa Nestle, NP 04/25/2022, 4:34 PM [place]

## 2022-04-25 ENCOUNTER — Ambulatory Visit: Payer: Medicare Other | Attending: Nurse Practitioner | Admitting: Nurse Practitioner

## 2022-04-25 ENCOUNTER — Encounter: Payer: Self-pay | Admitting: Nurse Practitioner

## 2022-04-25 VITALS — BP 128/74 | HR 83 | Ht 69.0 in | Wt 185.8 lb

## 2022-04-25 DIAGNOSIS — R600 Localized edema: Secondary | ICD-10-CM

## 2022-04-25 DIAGNOSIS — E785 Hyperlipidemia, unspecified: Secondary | ICD-10-CM | POA: Diagnosis not present

## 2022-04-25 DIAGNOSIS — N182 Chronic kidney disease, stage 2 (mild): Secondary | ICD-10-CM | POA: Diagnosis not present

## 2022-04-25 DIAGNOSIS — I5032 Chronic diastolic (congestive) heart failure: Secondary | ICD-10-CM | POA: Diagnosis not present

## 2022-04-25 DIAGNOSIS — I25709 Atherosclerosis of coronary artery bypass graft(s), unspecified, with unspecified angina pectoris: Secondary | ICD-10-CM | POA: Diagnosis not present

## 2022-04-25 DIAGNOSIS — I10 Essential (primary) hypertension: Secondary | ICD-10-CM

## 2022-04-25 NOTE — Patient Instructions (Signed)
Medication Instructions:  Your physician recommends that you continue on your current medications as directed. Please refer to the Current Medication list given to you today.  *If you need a refill on your cardiac medications before your next appointment, please call your pharmacy*   Lab Work: [place] If you have labs (blood work) drawn today and your tests are completely normal, you will receive your results only by: [place] (if you have MyChart) OR A paper copy in the mail If you have any lab test that is abnormal or we need to change your treatment, we will call you to review the results.   Follow-Up: At [facility], you and your health needs are our priority.  As part of our continuing mission to provide you with exceptional heart care, we have created designated Provider Care Teams.  These Care Teams include your primary Cardiologist (physician) and Advanced Practice Providers (APPs -  Physician Assistants and Nurse Practitioners) who all work together to provide you with the care you need, when you need it.  Your next appointment:   6 month(s)  The format for your next appointment:   In Person  Provider:   Ambrose Pancoast, NP        Important Information About Sugar

## 2022-04-26 LAB — BASIC METABOLIC PANEL
BUN/Creatinine Ratio: 10 (ref 10–24)
BUN: 13 mg/dL (ref 8–27)
CO2: 28 mmol/L (ref 20–29)
Calcium: 9.5 mg/dL (ref 8.6–10.2)
Chloride: 98 mmol/L (ref 96–106)
Creatinine, Ser: 1.33 mg/dL — ABNORMAL HIGH (ref 0.76–1.27)
Glucose: 134 mg/dL — ABNORMAL HIGH (ref 70–99)
Potassium: 4 mmol/L (ref 3.5–5.2)
Sodium: 142 mmol/L (ref 134–144)
eGFR: 56 mL/min/{1.73_m2} — ABNORMAL LOW (ref >59)

## 2022-04-27 ENCOUNTER — Telehealth: Payer: Self-pay | Admitting: *Deleted

## 2022-04-27 NOTE — Chronic Care Management (AMB) (Signed)
  Care Coordination   Note   04/27/2022 Name: CARLY APPLEGATE MRN: [id number] DOB: 1945/10/22  Tana Conch Obryan is a 76 y.o. year old male who sees Slatosky, Marshall Cork., MD for primary care. I reached out to [name] by phone today to offer care coordination services.  Mr. Swab was given information about Care Coordination services today including:   The Care Coordination services include support from the care team which includes your Nurse Coordinator, Clinical Social Worker, or Pharmacist.  The Care Coordination team is here to help remove barriers to the health concerns and goals most important to you. Care Coordination services are voluntary, and the patient may decline or stop services at any time by request to their care team member.   Care Coordination Consent Status: Patient did not agree to participate in care coordination services at this time.    Encounter Outcome:  Pt. Refused  [place]  Direct Dial: [phone]

## 2022-05-15 ENCOUNTER — Encounter: Payer: Self-pay | Admitting: Emergency Medicine

## 2022-05-15 ENCOUNTER — Ambulatory Visit (INDEPENDENT_AMBULATORY_CARE_PROVIDER_SITE_OTHER): Payer: Medicare Other | Admitting: Emergency Medicine

## 2022-05-15 VITALS — BP 140/76 | HR 92 | Temp 98.2°F | Ht 69.0 in | Wt 179.4 lb

## 2022-05-15 DIAGNOSIS — R600 Localized edema: Secondary | ICD-10-CM | POA: Diagnosis not present

## 2022-05-15 DIAGNOSIS — I2510 Atherosclerotic heart disease of native coronary artery without angina pectoris: Secondary | ICD-10-CM

## 2022-05-15 DIAGNOSIS — J439 Emphysema, unspecified: Secondary | ICD-10-CM | POA: Diagnosis not present

## 2022-05-15 DIAGNOSIS — Z23 Encounter for immunization: Secondary | ICD-10-CM

## 2022-05-15 NOTE — Assessment & Plan Note (Signed)
Continue your torsemide as managed by Dr. Marlou Porch

## 2022-05-15 NOTE — Patient Instructions (Addendum)
We will discontinue the Yupelri, Brovana, Pulmicort nebulizers Please continue Symbicort 2 puffs twice a day.  Rinse and gargle after using Please continue Spiriva once daily as you have been using Keep albuterol available to use 2 puffs if needed for shortness of breath, chest tightness, wheezing. Flu shot today Get the COVID-19 vaccine 3 months after your recent diagnosis in August. Recommend getting the RSV vaccine this fall We will perform a walking oximetry today Continue your torsemide as managed by Dr. Marlou Porch Follow with Dr [name] in 12 months or sooner if you have any problems

## 2022-05-15 NOTE — Assessment & Plan Note (Signed)
We will discontinue the Yupelri, Brovana, Pulmicort nebulizers Please continue Symbicort 2 puffs twice a day.  Rinse and gargle after using Please continue Spiriva once daily as you have been using Keep albuterol available to use 2 puffs if needed for shortness of breath, chest tightness, wheezing. Flu shot today Get the COVID-19 vaccine 3 months after your recent diagnosis in August. Recommend getting the RSV vaccine this fall We will perform a walking oximetry today Follow with Dr [name] in 6 months or sooner if you have any problems

## 2022-05-15 NOTE — Progress Notes (Signed)
HPI:  ROV 12/20/21 --76 year old man with very severe COPD and associated chronic hypoxemic respiratory failure.  He has been experiencing fairly frequent exacerbations, has had prednisone multiple times.  I had treated with Depo-Medrol at his request at our last visit in early April.  He did not desaturate with ambulation at our last visit.  Remains on Symbicort, Spiriva.  Treating GERD and rhinitis aggressively.  He also has bilateral lower extremity edema so I treated him with 5 days of scheduled Lasix to see if you get benefit. Today he reports that his swelling did improve when he took the lasix x 5 days, is now back to prn. He does not believe his overall breathing is any better. He uses albuterol about 2-3x a day. He is trying to get back to the gym, walking for 20 min. He is on loratadine and fluticasone nasal spray.    ROV 05/15/22 --Oscar Robinson is 76 with a history of very severe COPD and associated chronic hypoxemic respiratory failure.  I last saw him in May and has been seeing twice here since.  I have been managing him on Symbicort and Spiriva, intermittently has required diuretics.  In May I started him on prednisone 20 mg daily see if he would get benefit.  He did not notice that it helped him.  He was changed to nebulized regimen, Yupelri, Brovana, budesonide but ultimately he went back to the original regimen (mainly for time reasons).  The prednisone was tapered to off.  An ONO was performed that qualified him for nocturnal oxygen but he wanted to defer.  He started pulmonary rehab. He had COVID-19 in August - was treated with prednisone, also was given an antiviral at [facility]  He is active - works on farm. Minimal cough or sputum. Occasionally hears some wheeze.    EXAM:  Vitals:   05/15/22 1519  BP: (!) 140/76  Pulse: 92  Temp: 98.2 F (36.8 C)  TempSrc: Oral  SpO2: 95%  Weight: 179 lb 6.4 oz (81.4 kg)  Height: '5\' 9"'$  (1.753 m)    Gen: Pleasant, well-nourished, in no distress,   normal affect  ENT: No lesions,  mouth clear,  oropharynx clear, no postnasal drip  Neck: No JVD, no stridor  Lungs: No use of accessory muscles, distant w exp wheeze  Cardiovascular: RRR, heart sounds normal, no murmur or gallops  Musculoskeletal: No deformities, no cyanosis or clubbing  Neuro: alert, non focal  Skin: Warm, no lesions or rashes   GOLD COPD C We will discontinue the Yupelri, Brovana, Pulmicort nebulizers Please continue Symbicort 2 puffs twice a day.  Rinse and gargle after using Please continue Spiriva once daily as you have been using Keep albuterol available to use 2 puffs if needed for shortness of breath, chest tightness, wheezing. Flu shot today Get the COVID-19 vaccine 3 months after your recent diagnosis in August. Recommend getting the RSV vaccine this fall We will perform a walking oximetry today Follow with Dr [name] in 6 months or sooner if you have any problems  Lower extremity edema Continue your torsemide as managed by Dr. Vito Berger, MD, PhD 05/15/2022, 3:48 PM [facility] [phone] or if no answer [phone]

## 2022-05-21 ENCOUNTER — Other Ambulatory Visit: Payer: Self-pay | Admitting: Cardiology

## 2022-05-22 NOTE — Telephone Encounter (Signed)
Pt's pharmacy is requesting a refill on colesevelam. Would Dr. Marlou Porch like to refill this medication? Please address

## 2022-05-29 DIAGNOSIS — J449 Chronic obstructive pulmonary disease, unspecified: Secondary | ICD-10-CM | POA: Diagnosis not present

## 2022-05-30 ENCOUNTER — Other Ambulatory Visit: Payer: Self-pay | Admitting: Cardiology

## 2022-05-30 ENCOUNTER — Other Ambulatory Visit: Payer: Self-pay | Admitting: Nurse Practitioner

## 2022-05-31 ENCOUNTER — Ambulatory Visit: Payer: Medicare Other | Attending: Cardiology | Admitting: Cardiology

## 2022-05-31 ENCOUNTER — Encounter: Payer: Self-pay | Admitting: Cardiology

## 2022-05-31 VITALS — BP 140/76 | HR 77 | Ht 69.0 in | Wt 183.0 lb

## 2022-05-31 DIAGNOSIS — I25709 Atherosclerosis of coronary artery bypass graft(s), unspecified, with unspecified angina pectoris: Secondary | ICD-10-CM | POA: Insufficient documentation

## 2022-05-31 DIAGNOSIS — I2510 Atherosclerotic heart disease of native coronary artery without angina pectoris: Secondary | ICD-10-CM | POA: Diagnosis not present

## 2022-05-31 NOTE — Patient Instructions (Signed)
Medication Instructions:  The current medical regimen is effective;  continue present plan and medications.  *If you need a refill on your cardiac medications before your next appointment, please call your pharmacy*  Follow-Up: At [facility], you and your health needs are our priority.  As part of our continuing mission to provide you with exceptional heart care, we have created designated Provider Care Teams.  These Care Teams include your primary Cardiologist (physician) and Advanced Practice Providers (APPs -  Physician Assistants and Nurse Practitioners) who all work together to provide you with the care you need, when you need it.  We recommend signing up for the patient portal called "MyChart".  Sign up information is provided on this After Visit Summary.  MyChart is used to connect with patients for Virtual Visits (Telemedicine).  Patients are able to view lab/test results, encounter notes, upcoming appointments, etc.  Non-urgent messages can be sent to your provider as well.   To learn more about what you can do with MyChart, go to [email or web address].    Your next appointment:   6 month(s)  The format for your next appointment:   In Person  Provider:   Ambrose Pancoast, NP         Important Information About Sugar

## 2022-05-31 NOTE — Progress Notes (Signed)
Cardiology Office Note:    Date:  05/31/2022   ID:  [name], DOB 05/03/1946, MRN [id number]  PCP:  [name], MD  [facility] Cardiologist:  Candee Furbish, MD  [facility] Electrophysiologist:  None   Referring MD: [name], MD    History of Present Illness:    Oscar Robinson is a 76 y.o. male here to follow up for pre op clearance for a prostate biopsy  They request to hold aspirin 7 days pre and post procedure  He had a recent follow up with Ambrose Pancoast NP on 04/25/2022 where he was doing well with no chest discomfort or extreme shortness of breath at rest. He noted "swimmy headedness" that occurred twice in the last month which could be associated with dehydration. He was euvolemic and was tolerating his current medication.  Last visit he described how while planting crops, kicking up dust and dirt in late September 2022 he ended up developing pneumonia and got quite sick.  He is now over this but it is hard for him to keep his stamina up. He had a CABG in 1999-LIMA to LAD and COPD followed by pulmonary clinic, [name]. He also had lower extremity edema left greater than right in general along with shortness of breath.  Today, the patient states that he has been having problems breathing but believes it to be his lungs. [place] and says he is still very busy and maybe even too busy. He has lost weight recently but he attributes it to the loss of fluid due to taking a diuretic.  He is concerned about complications from his upcoming prostate biopsy and believes it to not be very important. He plans to get another PSA test before committing to get the surgery.  He is wearing compression socks for his BLE edema.  He has quit smoking ever since his CABG in 1999.  He denies any palpitations, chest pain. No lightheadedness, headaches, syncope, orthopnea, or PND.  Past Medical History:  Diagnosis Date   Anemia     Anxiety    Arthritis    CKD (chronic kidney disease), stage II    COPD (chronic obstructive pulmonary disease) ([place])    Coronary artery disease    a. s/p CABG 1999.   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   Procedure: DUPUYTREN CONTRACTURE RELEASE;  Surgeon: Cammie Sickle., MD;  Location: [place];  Service: Orthopedics;  Laterality: Right;  palm, ring and small fingers dupuytrens contracture release   HAND SURGERY     lt palm,ring finger,   HAND SURGERY Left    pinky finger   HERNIA REPAIR  1996   lt/rt  ing hernia   LEFT HEART CATH AND CORS/GRAFTS ANGIOGRAPHY N/A 02/26/2018   Procedure: LEFT HEART CATH AND CORS/GRAFTS ANGIOGRAPHY;  Surgeon: Jettie Booze, MD;  Location: [place] CV [facility];  Service: Cardiovascular;  Laterality: N/A;   LEFT HEART CATHETERIZATION WITH CORONARY/GRAFT ANGIOGRAM N/A 06/15/2013   Procedure: LEFT HEART CATHETERIZATION WITH Beatrix Fetters;  Surgeon: Candee Furbish, MD;  Location: [facility] CATH LAB;  Service: Cardiovascular;  Laterality: N/A;   UPPER GASTROINTESTINAL ENDOSCOPY      Current Medications: Current Meds  Medication Sig   albuterol (PROAIR HFA) 108 (90 Base) MCG/ACT inhaler Inhale 2 puffs into the lungs every 4 (four) hours as needed for wheezing or shortness of breath.   albuterol (PROVENTIL) (2.5 MG/3ML) 0.083% nebulizer solution Take 3 mLs (2.5 mg  total) by nebulization every 6 (six) hours as needed for wheezing or shortness of breath.   ALPRAZolam (XANAX) 0.5 MG tablet Take 0.5 mg by mouth at bedtime.    antiseptic oral rinse (BIOTENE) LIQD 15 mLs by Mouth Rinse route daily.   arformoterol (BROVANA) 15 MCG/2ML NEBU Take 2 mLs (15 mcg total) by nebulization 2 (two) times daily.   aspirin 81 MG tablet Take 81 mg by mouth at bedtime.    benzonatate (TESSALON) 200 MG capsule Take 1 capsule (200 mg total) by mouth 3 (three) times daily as needed for cough.   budesonide (PULMICORT) 0.5 MG/2ML nebulizer solution Take 2 mLs (0.5 mg total) by nebulization in the morning and at bedtime.   Coenzyme Q10 (CO Q 10) 100 MG CAPS Take 100 mg by mouth at bedtime.    colesevelam (WELCHOL) 625 MG tablet TAKE 3 TABLETS BY MOUTH TWICE DAILY WITH MEALS   dextromethorphan-guaiFENesin (MUCINEX DM) 30-600 MG 12hr tablet Take 1 tablet by mouth 2 (two) times daily.   doxepin (SINEQUAN) 50 MG capsule Take 100 mg by mouth at bedtime.    esomeprazole (NEXIUM) 20 MG capsule Take 20 mg by mouth 2 (two) times daily.   fluticasone (FLONASE) 50 MCG/ACT nasal spray Use 1 spray(s) in each nostril once daily   HUMALOG 100 UNIT/ML injection 80 Units.    hydrochlorothiazide (HYDRODIURIL) 25 MG tablet Take 1 tablet by mouth once daily   levothyroxine (SYNTHROID) 125 MCG tablet Take 125 mcg by mouth every morning.   loratadine (CLARITIN) 10 MG tablet Take 1 tablet (10 mg total) by mouth daily.   losartan (COZAAR) 100 MG tablet Take 1 tablet by mouth once daily   metFORMIN (GLUCOPHAGE) 500 MG tablet Take 1 tablet (500 mg total) by mouth 2 (two) times daily with a meal.   metoprolol succinate (TOPROL-XL) 50 MG 24 hr tablet TAKE 1 TABLET BY MOUTH ONCE DAILY WITH  OR  IMMEDIATELY  FOLLOWING  A  MEAL   Multiple Vitamins-Minerals (CENTRUM SILVER 50+MEN) TABS Take 1 tablet by mouth daily.   nitroGLYCERIN (NITROSTAT) 0.4 MG SL tablet Place 1 tablet (0.4 mg total) under the tongue every 5  (five) minutes as needed for chest pain.   Omega-3 Fatty Acids (FISH OIL) 600 MG CAPS Take 1,400 mg by mouth at bedtime.    potassium chloride (KLOR-CON) 10 MEQ tablet Take 20 mEq for 2 days then 10 mEq daily   pravastatin (PRAVACHOL) 40 MG tablet Take 1 tablet (40 mg total) by mouth daily.   revefenacin (YUPELRI) 175 MCG/3ML nebulizer solution Take 3 mLs (175 mcg total) by nebulization daily.   SYMBICORT 160-4.5 MCG/ACT inhaler Inhale 2 puffs by mouth twice daily   tamsulosin (FLOMAX) 0.4  MG CAPS capsule SMARTSIG:1 Capsule(s) By Mouth Every Evening   Tiotropium Bromide Monohydrate (SPIRIVA RESPIMAT) 2.5 MCG/ACT AERS INHALE 2 SPRAY(S) BY MOUTH ONCE DAILY   torsemide (DEMADEX) 20 MG tablet TAKE 2 TABLETS BY MOUTH ONCE DAILY FOR 3 DAYS THEN DECREASE TO 1 TABLET ONCE DAILY   Current Facility-Administered Medications for the 05/31/22 encounter (Office Visit) with Jerline Pain, MD  Medication   0.9 %  sodium chloride infusion     Allergies:   Patient has no known allergies.   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  Tobacco Use   Smoking status: Former    Packs/day: 2.00    Years: 35.00    Total pack years: 70.00    Types: Cigarettes    Quit date: 09/27/1997    Years since quitting: 24.6   Smokeless tobacco: Never  Substance and Sexual Activity   Alcohol use: Yes    Comment: occ   Drug use: No   Sexual activity: Not on file  Other Topics Concern   Not on file  Social History Narrative   Not on file   Social Determinants of Health   Financial Resource Strain: Not on file  Food Insecurity: Not on file  Transportation Needs: Not on file  Physical Activity: Not on file  Stress: Not on file  Social Connections: Not on file     Family  History: The patient's family history includes Drug abuse in his brother. There is no history of Heart disease, Heart failure, Diabetes, Hypertension, Colon cancer, Esophageal cancer, Prostate cancer, Pancreatic cancer, Kidney disease, Liver disease, Stomach cancer, or Rectal cancer.  ROS:   Please see the history of present illness.    (+) SOB (related to COPD)  All other systems reviewed and are negative.  EKGs/Labs/Other Studies Reviewed:     The following studies were reviewed today:  EKG: EKG is personally reviewed 05/31/2022:NSR 77 bpm PAC 11/27/2021 (Dayna Dunn PA-C): NSR with PAC's 72 bpm no acute changes. 06/08/2021: EKG was not ordered  Cardiac catheterization 02/26/2018 Ost 1st Diag lesion is 75% stenosed. LIMA to diagonal is patent. Appears unchanged from prior cath. Prox LAD lesion is 25% stenosed. The left ventricular systolic function is normal. LV end diastolic pressure is normal. The left ventricular ejection fraction is 55-65% by visual estimate. There is no aortic valve stenosis.   No change from prior cath.   Echocardiogram 05/24/20 EF 60-65, no RWMA, Gr 1 DD, normal RVSF, normal PASP   Myoview 05/24/20 Normal perfusion. LVEF 62% with normal wall motion. This is a low risk study. Compared to a prior study in 2014, ischemia is no longer present.   Cardiac catheterization 02/26/18 LAD prox 25; D1 ost 75 L-LAD patent  EF 55-65   Echocardiogram 01/28/18 Mild LVH, EF 55-65, no RWMA, trivial MR, normal RVSF, PASP 23  ECHO 2021:   1. Left ventricular ejection fraction, by estimation, is 60 to 65%. The  left ventricle has normal function. The left ventricle has no regional  wall motion abnormalities. Left ventricular diastolic parameters are  consistent with Grade I diastolic  dysfunction (impaired relaxation).   2. Right ventricular systolic function is normal. The right ventricular  size is normal. There is normal pulmonary  artery systolic pressure.   3.  The mitral valve is normal in structure. No evidence of mitral valve  regurgitation. No evidence of mitral stenosis.   4. The aortic valve is tricuspid. Aortic valve regurgitation is not  visualized. No aortic stenosis is present.   5. The inferior vena cava is normal in size with greater than 50%  respiratory variability, suggesting right atrial pressure of 3 mmHg.   NUC 2021:  Nuclear stress EF: 62%. No T wave inversion was noted during stress. There was no ST segment deviation noted during stress. This is a low risk study.   Normal perfusion. LVEF 62% with normal wall motion. This is a low risk study. Compared to a prior study in 2014, ischemia is no longer present.    CPET 12/2/216 Conclusion: Exercise testing with gas exchange demonstrates a  significant functional impairment of atleast moderate severity  when compared to matched sedentary  norms. Pre-exercise spirometry demonstrates evidence of  severe obstruction. At peak exercise, the patient is severely  ventilatory limited due to obstructive lung physiology.    CT Chest 09/01/2020:  IMPRESSION: 1. No acute process in the chest. 2. Tiny hiatal hernia. Distal esophageal wall thickening, likely representing esophagitis. 3. Aortic Atherosclerosis (ICD10-I70.0) and Emphysema (ICD10-J43.9).  FINDINGS: Cardiovascular: Aortic atherosclerosis. Tortuous thoracic aorta. Normal heart size, without pericardial effusion. Median sternotomy for prior CABG.   Mediastinum/Nodes: No mediastinal or definite hilar adenopathy, given limitations of unenhanced CT. Tiny hiatal hernia. Distal esophageal wall thickening is mild to moderate on 126/2.   Lungs/Pleura: No pleural fluid. Moderate centrilobular emphysema. Mild biapical pleuroparenchymal scarring.   Upper Abdomen: Normal imaged portions of the liver, spleen, adrenal glands. Bilateral perinephric interstitial thickening is symmetric and nonspecific. Fatty replacement throughout  the pancreas. Abdominal aortic atherosclerosis.   Musculoskeletal: No acute osseous abnormality.  Recent Labs: 09/12/2021: Pro B Natriuretic peptide (BNP) 63.0 04/25/2022: BUN 13; Creatinine, Ser 1.33; Potassium 4.0; Sodium 142  Recent Lipid Panel    Component Value Date/Time   CHOL 143 03/14/2021 1226   TRIG 107 03/14/2021 1226   HDL 57 03/14/2021 1226   CHOLHDL 2.5 03/14/2021 1226   CHOLHDL 2.2 06/05/2016 1055   VLDL 18 06/05/2016 1055   LDLCALC 67 03/14/2021 1226     Risk Assessment/Calculations:      Physical Exam:    VS:  BP (!) 140/76   Pulse 77   Ht '5\' 9"'$  (1.753 m)   Wt 183 lb (83 kg)   SpO2 95%   BMI 27.02 kg/m     Wt Readings from Last 3 Encounters:  05/31/22 183 lb (83 kg)  05/15/22 179 lb 6.4 oz (81.4 kg)  04/25/22 185 lb 12.8 oz (84.3 kg)     GEN:  Well nourished, well developed in no acute distress HEENT: Normal NECK: No JVD; No carotid bruits LYMPHATICS: No lymphadenopathy CARDIAC:  RRR, no murmurs, rubs, gallops RESPIRATORY:  Wheezing bilaterally with prolonged expiratory phase, without rales, or rhonchi  ABDOMEN: Soft, non-tender, non-distended MUSCULOSKELETAL:  2+ bilateral LE edema; No deformity  SKIN: Warm and dry NEUROLOGIC:  Alert and oriented x 3 PSYCHIATRIC:  Normal affect   ASSESSMENT:    1. Coronary artery disease involving coronary bypass graft of native heart with angina pectoris ([place])   2. Coronary artery disease involving native coronary artery of native heart without angina pectoris       PLAN:    In order of problems listed above:  Coronary artery disease involving coronary bypass graft of native  heart with angina pectoris (HCC) Currently stable.  No anginal symptoms.  No change in medication management.  No changes made.   Lower extremity edema Much improved continue with current diuretic dose.  Continue with torsemide 20 mg a day.  Takes potassium supplements as well.   GOLD COPD C Continue to work on rehab efforts,  exercise.  Pulmonary.  Still has wheezes on exam.  He does believe that his shortness of breath is mainly from COPD.  Follow Up: 6 months with Ambrose Pancoast (he was very complementary of our Ernest's care), 12 months with me   Medication Adjustments/Labs and Tests Ordered: Current medicines are reviewed at length with the patient today.  Concerns regarding medicines are outlined above.  Orders Placed This Encounter  Procedures   EKG 12-Lead    No orders of the defined types were placed in this encounter.   Patient Instructions  Medication Instructions:  The current medical regimen is effective;  continue present plan and medications.  *If you need a refill on your cardiac medications before your next appointment, please call your pharmacy*  Follow-Up: At [facility], you and your health needs are our priority.  As part of our continuing mission to provide you with exceptional heart care, we have created designated Provider Care Teams.  These Care Teams include your primary Cardiologist (physician) and Advanced Practice Providers (APPs -  Physician Assistants and Nurse Practitioners) who all work together to provide you with the care you need, when you need it.  We recommend signing up for the patient portal called "MyChart".  Sign up information is provided on this After Visit Summary.  MyChart is used to connect with patients for Virtual Visits (Telemedicine).  Patients are able to view lab/test results, encounter notes, upcoming appointments, etc.  Non-urgent messages can be sent to your provider as well.   To learn more about what you can do with MyChart, go to [email or web address].    Your next appointment:   6 month(s)  The format for your next appointment:   In Person  Provider:   Ambrose Pancoast, NP         Important Information About Sugar         I,Coren O'Brien,acting as a scribe for Candee Furbish, MD.,have documented all relevant documentation on the behalf  of Candee Furbish, MD,as directed by  Candee Furbish, MD while in the presence of Candee Furbish, MD.  I, Candee Furbish, MD, have reviewed all documentation for this visit. The documentation on 05/31/22 for the exam, diagnosis, procedures, and orders are all accurate and complete.   Signed, Candee Furbish, MD  05/31/2022 5:10 PM    [facility]

## 2022-06-04 DIAGNOSIS — J449 Chronic obstructive pulmonary disease, unspecified: Secondary | ICD-10-CM | POA: Diagnosis not present

## 2022-06-06 DIAGNOSIS — J449 Chronic obstructive pulmonary disease, unspecified: Secondary | ICD-10-CM | POA: Diagnosis not present

## 2022-06-08 DIAGNOSIS — J449 Chronic obstructive pulmonary disease, unspecified: Secondary | ICD-10-CM | POA: Diagnosis not present

## 2022-06-11 DIAGNOSIS — J449 Chronic obstructive pulmonary disease, unspecified: Secondary | ICD-10-CM | POA: Diagnosis not present

## 2022-06-13 DIAGNOSIS — J449 Chronic obstructive pulmonary disease, unspecified: Secondary | ICD-10-CM | POA: Diagnosis not present

## 2022-06-15 DIAGNOSIS — J449 Chronic obstructive pulmonary disease, unspecified: Secondary | ICD-10-CM | POA: Diagnosis not present

## 2022-06-18 ENCOUNTER — Other Ambulatory Visit: Payer: Self-pay | Admitting: Cardiology

## 2022-06-18 DIAGNOSIS — J441 Chronic obstructive pulmonary disease with (acute) exacerbation: Secondary | ICD-10-CM | POA: Diagnosis not present

## 2022-06-25 DIAGNOSIS — J449 Chronic obstructive pulmonary disease, unspecified: Secondary | ICD-10-CM | POA: Diagnosis not present

## 2022-06-26 DIAGNOSIS — J449 Chronic obstructive pulmonary disease, unspecified: Secondary | ICD-10-CM | POA: Diagnosis not present

## 2022-06-27 DIAGNOSIS — J449 Chronic obstructive pulmonary disease, unspecified: Secondary | ICD-10-CM | POA: Diagnosis not present

## 2022-07-02 DIAGNOSIS — J449 Chronic obstructive pulmonary disease, unspecified: Secondary | ICD-10-CM | POA: Diagnosis not present

## 2022-07-04 DIAGNOSIS — J449 Chronic obstructive pulmonary disease, unspecified: Secondary | ICD-10-CM | POA: Diagnosis not present

## 2022-07-06 DIAGNOSIS — J449 Chronic obstructive pulmonary disease, unspecified: Secondary | ICD-10-CM | POA: Diagnosis not present

## 2022-07-09 DIAGNOSIS — J449 Chronic obstructive pulmonary disease, unspecified: Secondary | ICD-10-CM | POA: Diagnosis not present

## 2022-07-11 DIAGNOSIS — Z23 Encounter for immunization: Secondary | ICD-10-CM | POA: Diagnosis not present

## 2022-07-11 DIAGNOSIS — J449 Chronic obstructive pulmonary disease, unspecified: Secondary | ICD-10-CM | POA: Diagnosis not present

## 2022-07-13 DIAGNOSIS — J449 Chronic obstructive pulmonary disease, unspecified: Secondary | ICD-10-CM | POA: Diagnosis not present

## 2022-07-16 DIAGNOSIS — J449 Chronic obstructive pulmonary disease, unspecified: Secondary | ICD-10-CM | POA: Diagnosis not present

## 2022-07-18 DIAGNOSIS — J449 Chronic obstructive pulmonary disease, unspecified: Secondary | ICD-10-CM | POA: Diagnosis not present

## 2022-07-20 DIAGNOSIS — J449 Chronic obstructive pulmonary disease, unspecified: Secondary | ICD-10-CM | POA: Diagnosis not present

## 2022-07-23 DIAGNOSIS — J449 Chronic obstructive pulmonary disease, unspecified: Secondary | ICD-10-CM | POA: Diagnosis not present

## 2022-07-24 DIAGNOSIS — R531 Weakness: Secondary | ICD-10-CM | POA: Diagnosis not present

## 2022-07-24 DIAGNOSIS — Z9641 Presence of insulin pump (external) (internal): Secondary | ICD-10-CM | POA: Diagnosis not present

## 2022-07-24 DIAGNOSIS — E7800 Pure hypercholesterolemia, unspecified: Secondary | ICD-10-CM | POA: Diagnosis not present

## 2022-07-24 DIAGNOSIS — E02 Subclinical iodine-deficiency hypothyroidism: Secondary | ICD-10-CM | POA: Diagnosis not present

## 2022-07-24 DIAGNOSIS — M19049 Primary osteoarthritis, unspecified hand: Secondary | ICD-10-CM | POA: Diagnosis not present

## 2022-07-24 DIAGNOSIS — E1165 Type 2 diabetes mellitus with hyperglycemia: Secondary | ICD-10-CM | POA: Diagnosis not present

## 2022-07-24 DIAGNOSIS — I10 Essential (primary) hypertension: Secondary | ICD-10-CM | POA: Diagnosis not present

## 2022-07-24 DIAGNOSIS — E049 Nontoxic goiter, unspecified: Secondary | ICD-10-CM | POA: Diagnosis not present

## 2022-07-25 DIAGNOSIS — J449 Chronic obstructive pulmonary disease, unspecified: Secondary | ICD-10-CM | POA: Diagnosis not present

## 2022-07-27 DIAGNOSIS — J449 Chronic obstructive pulmonary disease, unspecified: Secondary | ICD-10-CM | POA: Diagnosis not present

## 2022-08-06 IMAGING — US US THYROID
1 series · 14 of 25 positions shown · non-contrast
Comparison: 07/20/2015

08/12/2014

07/22/2014

CLINICAL DATA: Goiter.

Prior right thyroid FNA
EXAM:
THYROID ULTRASOUND
TECHNIQUE: Ultrasound examination of the thyroid gland and adjacent soft
tissues was performed.

[Series 1: us thyroid · 0.05mm/px · 14 of 49 slices shown]
[im 1/49]
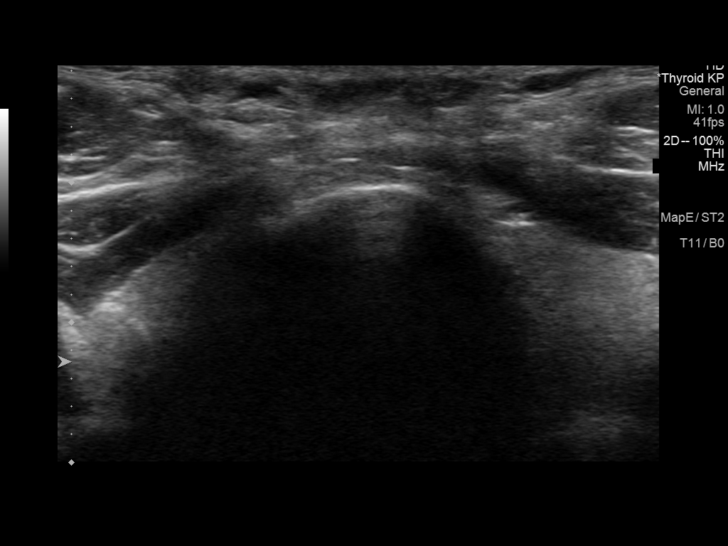
[im 5/49]
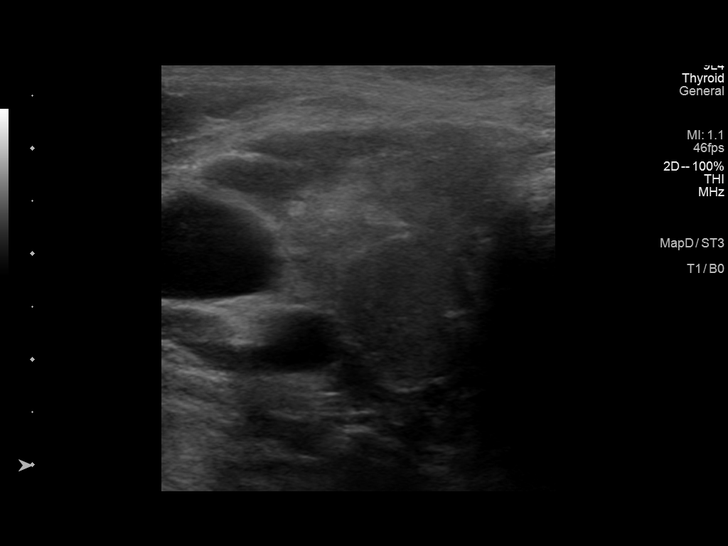
[im 9/49]
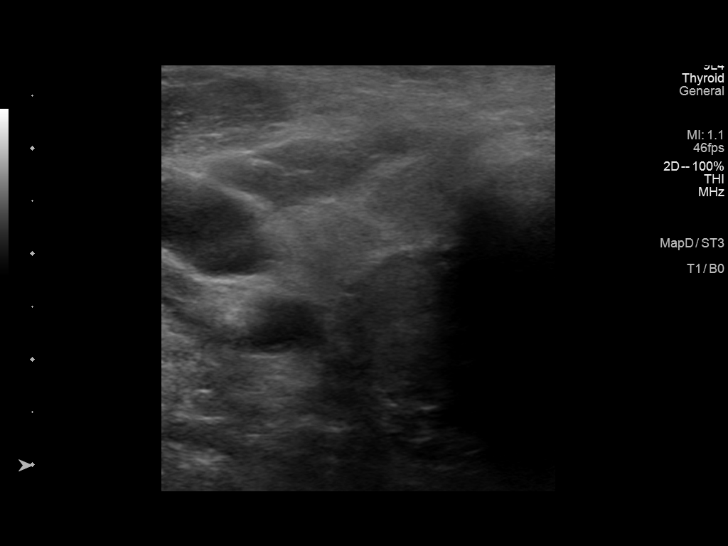
[im 13/49]
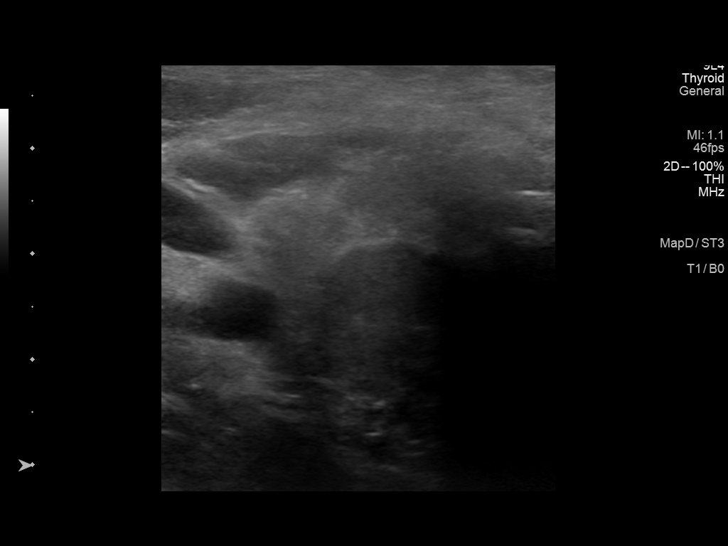
[im 17/49]
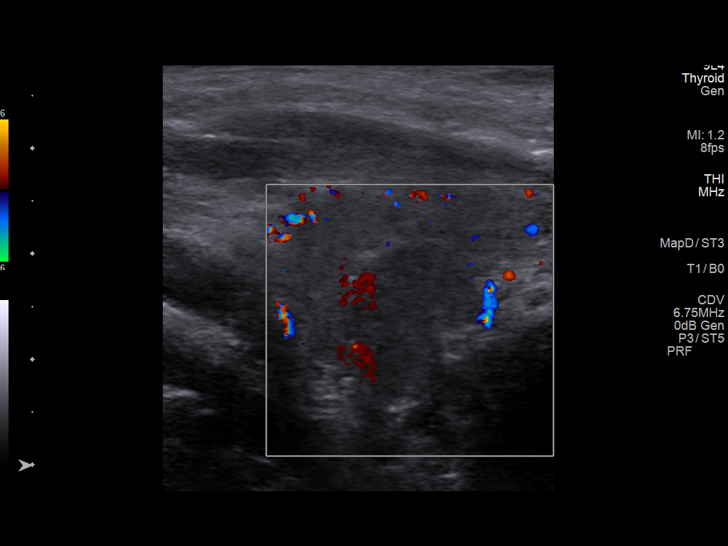
[im 19/49]
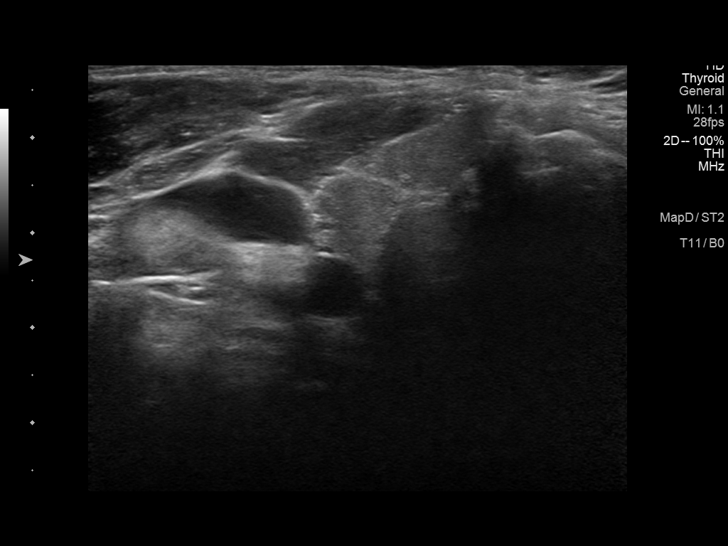
[im 23/49]
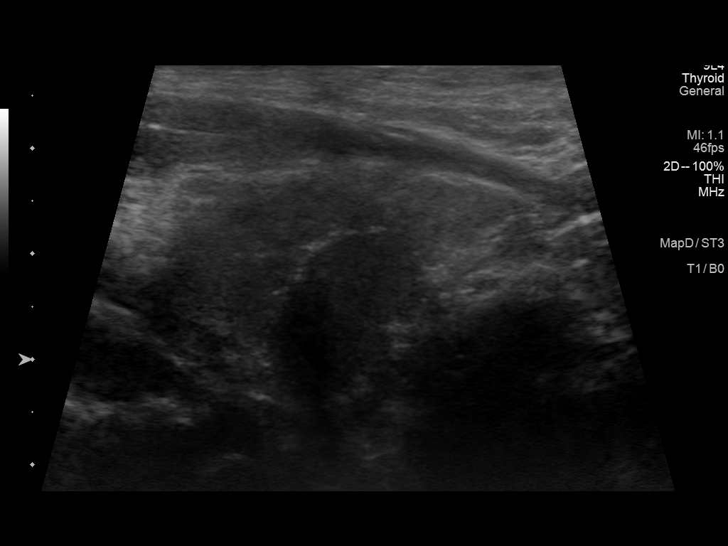
[im 27/49]
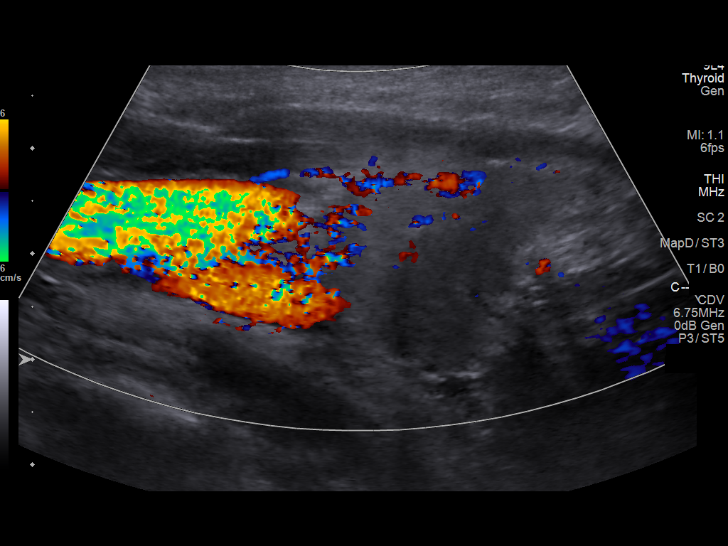
[im 31/49]
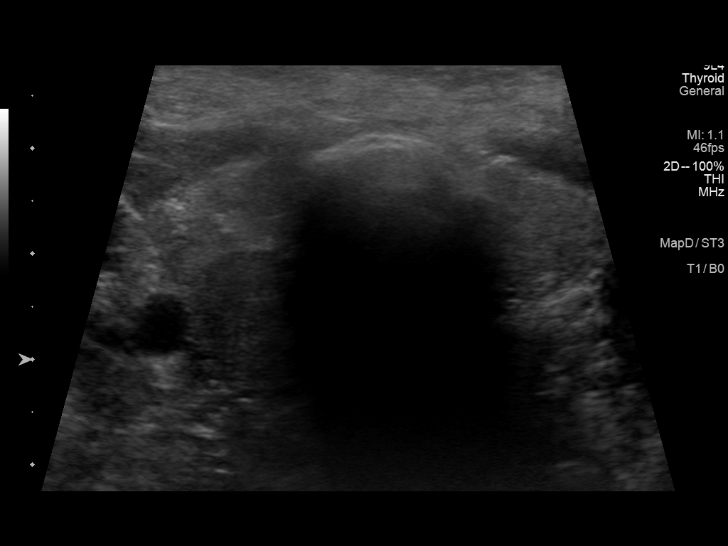
[im 33/49]
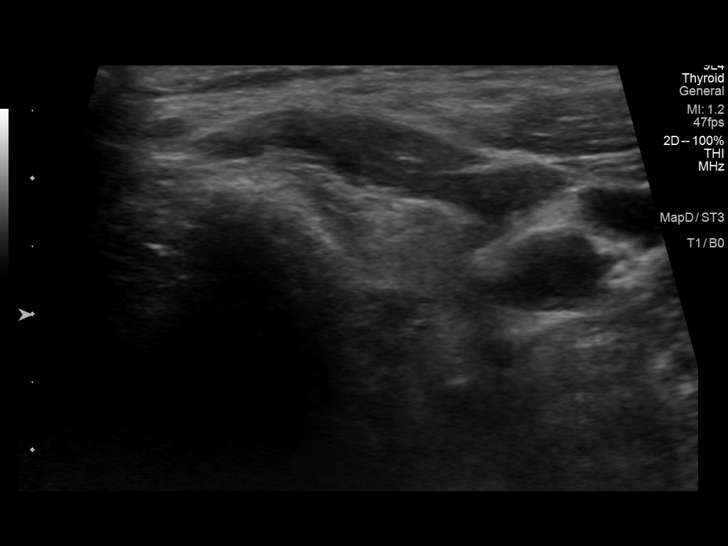
[im 37/49]
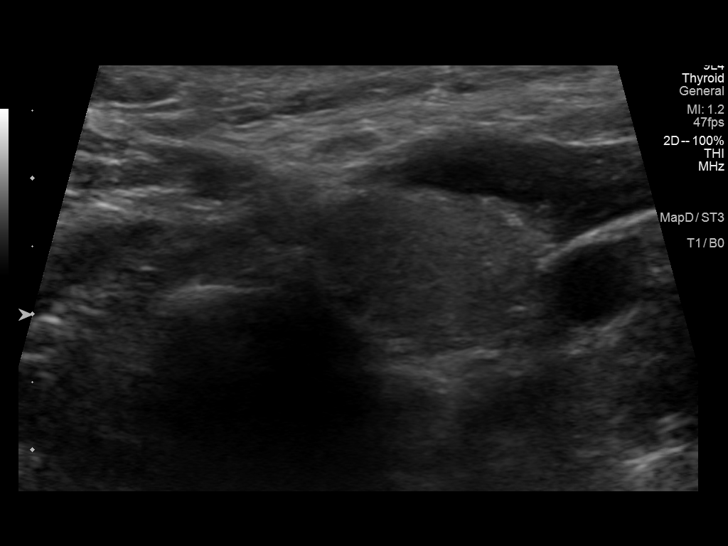
[im 41/49]
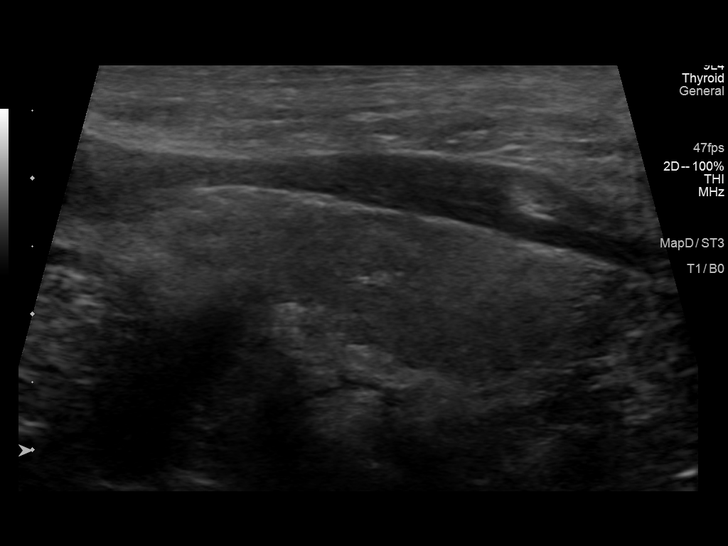
[im 45/49]
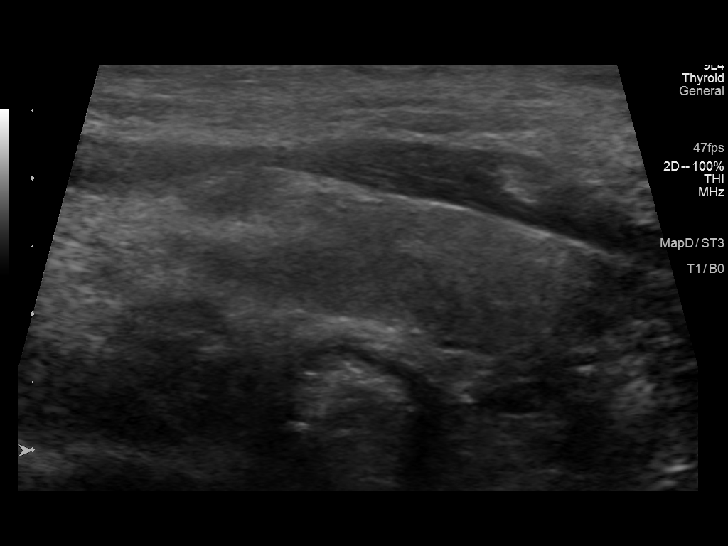
[im 49/49]
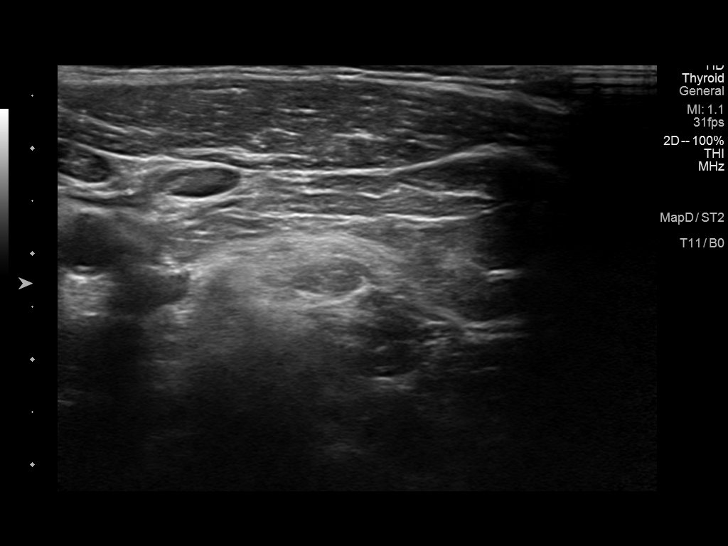

[14 of 25 positions shown; findings below may reference images not displayed]

FINDINGS: Parenchymal Echotexture: Mildly heterogeneous

Isthmus: 0.3 cm

Right lobe: 3.9 x 2.1 x 2.0 cm

Left lobe: 3.2 x 1.1 x 1.3 cm

_________________________________________________________

Estimated total number of nodules >/= 1 cm: 1

Number of spongiform nodules >/=  2 cm not described below (TR1): 0

Number of mixed cystic and solid nodules >/= 1.5 cm not described
below (TR2): 0

_________________________________________________________

Nodule 1: Previously biopsied right inferior thyroid nodule is not
significantly changed in size measuring 1.4 x 1.4 x 1.3 cm compared
to 1.5 x 1.5 x 1.3 cm on the prior exam. Please correlate with FNA
results.

No new thyroid nodules.
IMPRESSION: Previously biopsied right inferior thyroid nodule is unchanged in
size since 07/20/2015. Please correlate with FNA results.

The above is in keeping with the ACR TI-RADS recommendations - [HOSPITAL] 5722;[DATE].

## 2022-08-13 DIAGNOSIS — J449 Chronic obstructive pulmonary disease, unspecified: Secondary | ICD-10-CM | POA: Diagnosis not present

## 2022-08-15 DIAGNOSIS — J449 Chronic obstructive pulmonary disease, unspecified: Secondary | ICD-10-CM | POA: Diagnosis not present

## 2022-08-17 DIAGNOSIS — J449 Chronic obstructive pulmonary disease, unspecified: Secondary | ICD-10-CM | POA: Diagnosis not present

## 2022-08-20 DIAGNOSIS — J449 Chronic obstructive pulmonary disease, unspecified: Secondary | ICD-10-CM | POA: Diagnosis not present

## 2022-08-21 DIAGNOSIS — M7742 Metatarsalgia, left foot: Secondary | ICD-10-CM | POA: Diagnosis not present

## 2022-08-21 DIAGNOSIS — L851 Acquired keratosis [keratoderma] palmaris et plantaris: Secondary | ICD-10-CM | POA: Diagnosis not present

## 2022-08-21 DIAGNOSIS — E119 Type 2 diabetes mellitus without complications: Secondary | ICD-10-CM | POA: Diagnosis not present

## 2022-08-21 DIAGNOSIS — M7741 Metatarsalgia, right foot: Secondary | ICD-10-CM | POA: Diagnosis not present

## 2022-08-22 DIAGNOSIS — J449 Chronic obstructive pulmonary disease, unspecified: Secondary | ICD-10-CM | POA: Diagnosis not present

## 2022-08-24 DIAGNOSIS — J449 Chronic obstructive pulmonary disease, unspecified: Secondary | ICD-10-CM | POA: Diagnosis not present

## 2022-08-27 DIAGNOSIS — J449 Chronic obstructive pulmonary disease, unspecified: Secondary | ICD-10-CM | POA: Diagnosis not present

## 2022-09-03 DIAGNOSIS — J449 Chronic obstructive pulmonary disease, unspecified: Secondary | ICD-10-CM | POA: Diagnosis not present

## 2022-09-05 DIAGNOSIS — J449 Chronic obstructive pulmonary disease, unspecified: Secondary | ICD-10-CM | POA: Diagnosis not present

## 2022-09-10 DIAGNOSIS — J449 Chronic obstructive pulmonary disease, unspecified: Secondary | ICD-10-CM | POA: Diagnosis not present

## 2022-09-12 DIAGNOSIS — J449 Chronic obstructive pulmonary disease, unspecified: Secondary | ICD-10-CM | POA: Diagnosis not present

## 2022-09-14 DIAGNOSIS — J449 Chronic obstructive pulmonary disease, unspecified: Secondary | ICD-10-CM | POA: Diagnosis not present

## 2022-09-18 ENCOUNTER — Other Ambulatory Visit: Payer: Self-pay | Admitting: Nurse Practitioner

## 2022-09-18 DIAGNOSIS — J449 Chronic obstructive pulmonary disease, unspecified: Secondary | ICD-10-CM

## 2022-09-19 DIAGNOSIS — J449 Chronic obstructive pulmonary disease, unspecified: Secondary | ICD-10-CM | POA: Diagnosis not present

## 2022-09-21 DIAGNOSIS — J449 Chronic obstructive pulmonary disease, unspecified: Secondary | ICD-10-CM | POA: Diagnosis not present

## 2022-10-02 IMAGING — DX DG CHEST 2V
2 series · 2 of 2 positions shown · non-contrast
Comparison: 06/13/2021

CLINICAL DATA: COPD exacerbation question pneumonia

EXAM:
CHEST - 2 VIEW

[chest pa]
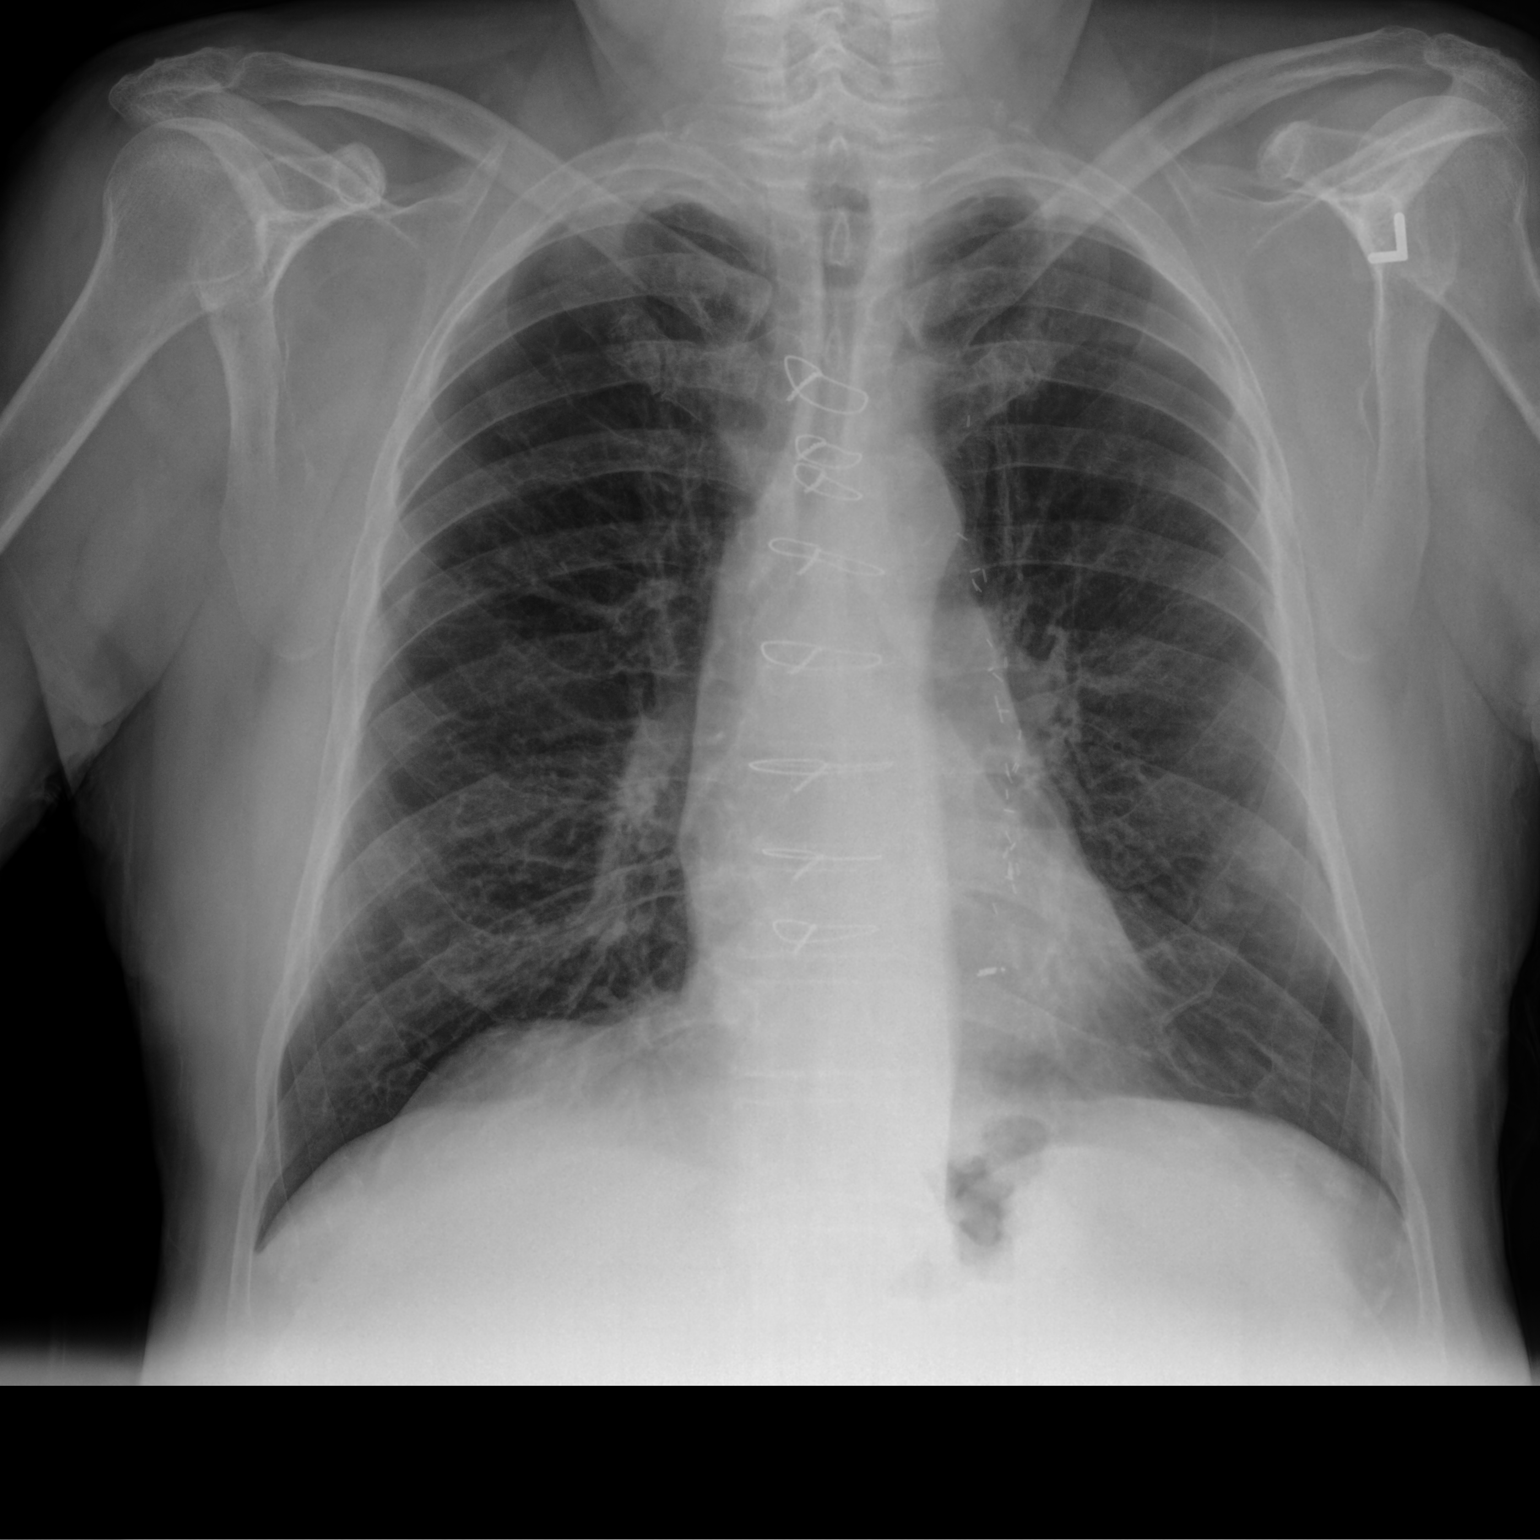

[chest lat]
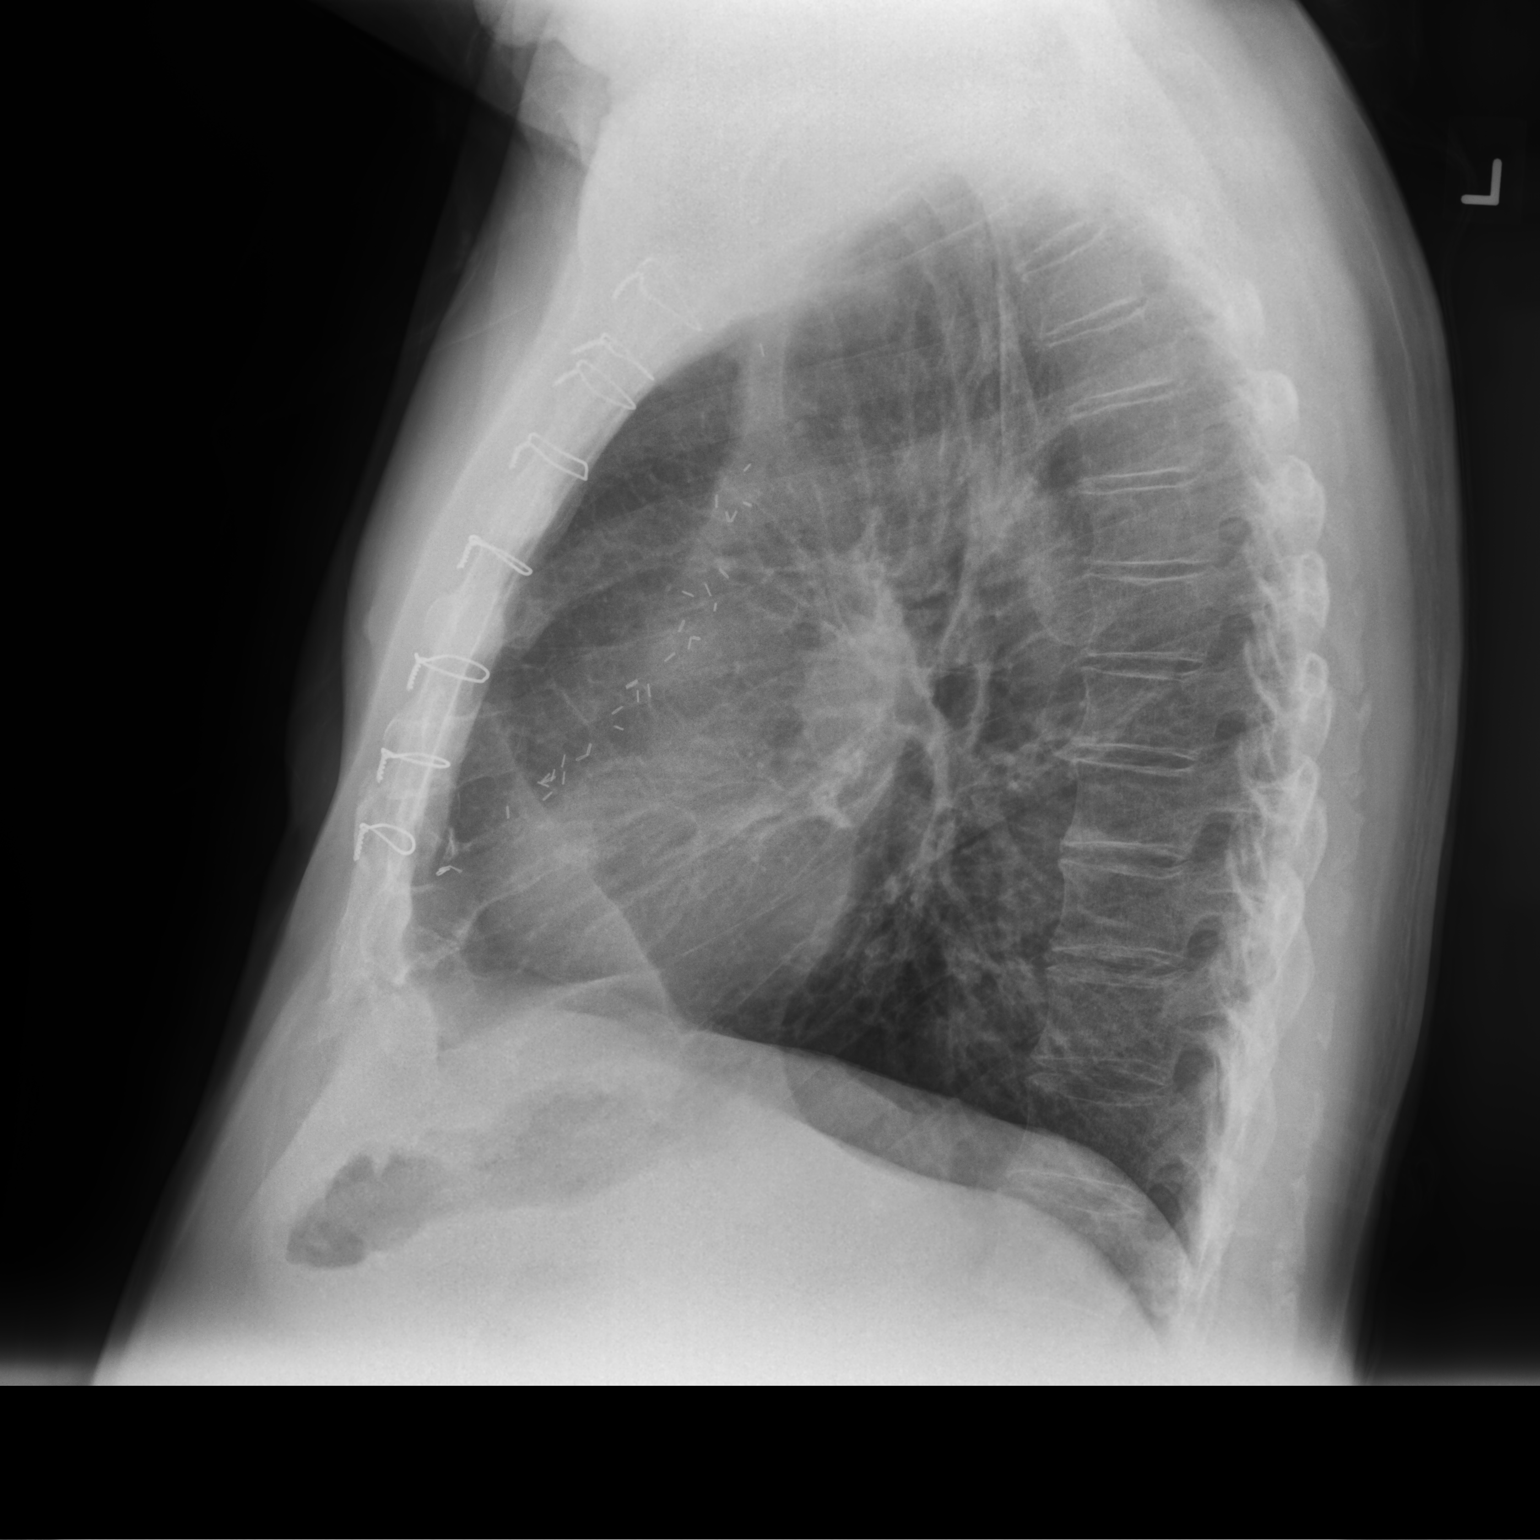

[2 of 2 positions shown; findings below may reference images not displayed]

FINDINGS: Normal heart size post CABG.

Mediastinal contours and pulmonary vascularity normal.

LEFT nipple shadow again identified, also seen on earlier study of
07/21/2015.

No pulmonary infiltrate, pleural effusion, or pneumothorax.

Focus of scarring laterally in mid RIGHT chest appears unchanged.

No acute osseous findings.
IMPRESSION: No acute abnormalities.

## 2022-10-19 ENCOUNTER — Other Ambulatory Visit: Payer: Self-pay | Admitting: Nurse Practitioner

## 2022-10-19 DIAGNOSIS — J449 Chronic obstructive pulmonary disease, unspecified: Secondary | ICD-10-CM

## 2022-10-23 DIAGNOSIS — R531 Weakness: Secondary | ICD-10-CM | POA: Diagnosis not present

## 2022-10-23 DIAGNOSIS — E02 Subclinical iodine-deficiency hypothyroidism: Secondary | ICD-10-CM | POA: Diagnosis not present

## 2022-10-23 DIAGNOSIS — E1165 Type 2 diabetes mellitus with hyperglycemia: Secondary | ICD-10-CM | POA: Diagnosis not present

## 2022-10-23 DIAGNOSIS — E7800 Pure hypercholesterolemia, unspecified: Secondary | ICD-10-CM | POA: Diagnosis not present

## 2022-10-28 ENCOUNTER — Other Ambulatory Visit: Payer: Self-pay | Admitting: Nurse Practitioner

## 2022-10-28 DIAGNOSIS — J449 Chronic obstructive pulmonary disease, unspecified: Secondary | ICD-10-CM

## 2022-10-29 NOTE — Progress Notes (Unsigned)
Office Visit    Patient Name: Oscar Robinson Date of Encounter: 10/30/2022  Primary Care Provider:  [name], MD Primary Cardiologist:  Candee Furbish, MD Primary Electrophysiologist: None  Chief Complaint    Oscar Robinson is a 77 y.o. male with PMH of CAD s/p CABG 1999, LIMA to LAD, DM, HLD, HTN, thyroid disease, COPD, obstructive sleep apnea, GERD, CKD stage II who presents today for 31-month follow-up of CHF.   Past Medical History    Past Medical History:  Diagnosis Date   Anemia    Anxiety    Arthritis    CKD (chronic kidney disease), stage II    COPD (chronic obstructive pulmonary disease) (HCC)    Coronary artery disease    a. s/p CABG 1999.   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   Procedure: DUPUYTREN CONTRACTURE RELEASE;  Surgeon: Cammie Sickle., MD;  Location: [place];  Service: Orthopedics;  Laterality: Right;  palm, ring and small fingers dupuytrens contracture release   HAND SURGERY     lt palm,ring finger,   HAND SURGERY Left    pinky finger   HERNIA REPAIR  1996   lt/rt ing hernia   LEFT HEART CATH AND CORS/GRAFTS ANGIOGRAPHY N/A 02/26/2018   Procedure: LEFT HEART CATH AND CORS/GRAFTS ANGIOGRAPHY;  Surgeon: Jettie Booze, MD;  Location: [facility];  Service: Cardiovascular;  Laterality: N/A;   LEFT HEART CATHETERIZATION WITH CORONARY/GRAFT  ANGIOGRAM N/A 06/15/2013   Procedure: LEFT HEART CATHETERIZATION WITH Beatrix Fetters;  Surgeon: Candee Furbish, MD;  Location: [facility];  Service: Cardiovascular;  Laterality: N/A;   UPPER GASTROINTESTINAL ENDOSCOPY      Allergies  No Known Allergies  History of Present Illness    Oscar Robinson is a 77 y.o. male with the above-mentioned past medical history who presents today for complaint of lower extremity edema.  He is simultaneously being followed by [place] pulmonology for severe COPD.  His last LHC was 02/2018 which revealed LAD/diagonal bifurcation disease with patent LIMA to LAD/diagonal albeit small caliber vessel and no change from previous cath.  He underwent Lexiscan Myoview on 05/2020 that was normal with no ischemia present. He was seen by [name] on 11/07/2021 with continued complaints of dyspnea and lower extremity edema. He was advised to take prednisone for 5 days and then switch back to every other day. He was seen on 04/25/2022 for follow-up.  During visit patient had no complaints but did note some swimmy headedness that occurs twice a month.  Since last being seen in the office patient reports he has been feeling well with no new cardiac complaints.  He denies any shortness of breath at rest.  His blood pressure today is  well-controlled at 138/60.  He has been taking torsemide as prescribed but is currently taking it every other day.  In reviewing his notes from his PCP his creatinine was elevated at 1.4 and we will make adjustments to his current diuretic regimen.  He recently completed pulmonary rehab at [facility] and was really pleased with the program.  He is exercising at a local gym but has been lacking some motivation.  He is abstaining from excess salt in his diet to the best of his ability.  He is euvolemic today on examination.  Patient denies chest pain, palpitations, dyspnea, PND, orthopnea, nausea, vomiting, dizziness, syncope, edema, weight gain, or early  satiety.  Home Medications    Current Outpatient Medications  Medication Sig Dispense Refill   albuterol (PROAIR HFA) 108 (90 Base) MCG/ACT inhaler Inhale 2 puffs into the lungs every 4 (four) hours as needed for wheezing or shortness of breath. 1 each 3   albuterol (PROVENTIL) (2.5 MG/3ML) 0.083% nebulizer solution Take 3 mLs (2.5 mg total) by nebulization every 6 (six) hours as needed for wheezing or shortness of breath. 360 mL 5   ALPRAZolam (XANAX) 0.5 MG tablet Take 0.5 mg by mouth at bedtime.      antiseptic oral rinse (BIOTENE) LIQD 15 mLs by Mouth Rinse route daily.     aspirin 81 MG tablet Take 81 mg by mouth at bedtime.      Coenzyme Q10 (CO Q 10) 100 MG CAPS Take 100 mg by mouth at bedtime.      colesevelam (WELCHOL) 625 MG tablet TAKE 3 TABLETS BY MOUTH TWICE DAILY WITH MEALS 540 tablet 1   dextromethorphan-guaiFENesin (MUCINEX DM) 30-600 MG 12hr tablet Take 1 tablet by mouth 2 (two) times daily. 60 tablet 0   doxepin (SINEQUAN) 50 MG capsule Take 100 mg by mouth at bedtime.      esomeprazole (NEXIUM) 20 MG capsule Take 20 mg by mouth 2 (two) times daily.     fluticasone (FLONASE) 50 MCG/ACT nasal spray Use 1 spray(s) in each nostril once daily 16 g 3   HUMALOG 100 UNIT/ML injection 80 Units.   6   hydrochlorothiazide (HYDRODIURIL) 25 MG tablet Take 1 tablet by mouth once daily 90 tablet 3   levothyroxine (SYNTHROID) 125 MCG tablet Take 125 mcg by mouth every morning.     loratadine (CLARITIN) 10 MG tablet Take 1 tablet (10 mg total) by mouth daily. 30 tablet 2   losartan (COZAAR) 100 MG tablet Take 1 tablet by mouth once daily 90 tablet 3   metFORMIN (GLUCOPHAGE) 500 MG tablet Take 1 tablet (500 mg total) by mouth 2 (two) times daily with a meal.     metoprolol succinate (TOPROL-XL) 50 MG 24 hr tablet TAKE 1 TABLET BY MOUTH ONCE DAILY WITH OR  IMMEDIATELY  FOLLOWING  A  MEAL. Please schedule appointment for additional refills. 90 tablet 1   Multiple Vitamins-Minerals (CENTRUM  SILVER 50+MEN) TABS Take 1 tablet by mouth daily.     nitroGLYCERIN (NITROSTAT) 0.4 MG SL tablet Place 1 tablet (0.4 mg total) under the tongue every 5 (five) minutes as needed for chest pain. 25 tablet 3   Omega-3 Fatty Acids (FISH OIL) 600 MG CAPS Take 1,400 mg by mouth at bedtime.      potassium chloride (KLOR-CON) 10 MEQ tablet Take 20 mEq for 2 days then 10 mEq daily 90 tablet 3   pravastatin (PRAVACHOL) 40 MG tablet Take 1 tablet (40 mg total) by mouth daily. Barnes  tablet 3   SYMBICORT 160-4.5 MCG/ACT inhaler Inhale 2 puffs by mouth twice daily 11 g 0   tamsulosin (FLOMAX) 0.4 MG CAPS capsule SMARTSIG:1 Capsule(s) By Mouth Every Evening     Tiotropium Bromide Monohydrate (SPIRIVA RESPIMAT) 2.5 MCG/ACT AERS INHALE 2 SPRAY(S) BY MOUTH ONCE DAILY 4 g 2   torsemide (DEMADEX) 20 MG tablet Take 20 mg by mouth daily. Pt takes 1 tablet 20 mg every other day.     Current Facility-Administered Medications  Medication Dose Route Frequency Provider Last Rate Last Admin   0.9 %  sodium chloride infusion  500 mL Intravenous Continuous Milus Banister, MD         Review of Systems  Please see the history of present illness.    (+) Shortness of breath with heavy exertion (+) Fatigue  All other systems reviewed and are otherwise negative except as noted above.  Physical Exam    Wt Readings from Last 3 Encounters:  10/30/22 180 lb 12.8 oz (82 kg)  05/31/22 183 lb (83 kg)  05/15/22 179 lb 6.4 oz (81.4 kg)   VS: Vitals:   10/30/22 1501  BP: 138/60  Pulse: 71  SpO2: 98%  ,Body mass index is 26.7 kg/m.  Constitutional:      Appearance: Healthy appearance. Not in distress.  Neck:     Vascular: JVD normal.  Pulmonary:     Effort: Pulmonary effort is normal.     Breath sounds: No wheezing. No rales. Diminished in the bases Cardiovascular:     Normal rate. Regular rhythm. Normal S1. Normal S2.      Murmurs: There is no murmur.  Edema:    Peripheral edema absent.  Abdominal:      Palpations: Abdomen is soft non tender. There is no hepatomegaly.  Skin:    General: Skin is warm and dry.  Neurological:     General: No focal deficit present.     Mental Status: Alert and oriented to person, place and time.     Cranial Nerves: Cranial nerves are intact.  EKG/LABS/ Recent Cardiac Studies    ECG personally reviewed by me today -none completed today   Lab Results  Component Value Date   WBC 7.2 03/14/2021   HGB 12.9 (L) 03/14/2021   HCT 39.2 03/14/2021   MCV 92 03/14/2021   PLT 227 03/14/2021   Lab Results  Component Value Date   CREATININE 1.33 (H) 04/25/2022   BUN 13 04/25/2022   NA 142 04/25/2022   K 4.0 04/25/2022   CL 98 04/25/2022   CO2 28 04/25/2022   Lab Results  Component Value Date   ALT 20 03/14/2021   AST 22 03/14/2021   ALKPHOS 65 03/14/2021   BILITOT 0.3 03/14/2021   Lab Results  Component Value Date   CHOL 143 03/14/2021   HDL 57 03/14/2021   LDLCALC 67 03/14/2021   TRIG 107 03/14/2021   CHOLHDL 2.5 03/14/2021    Lab Results  Component Value Date   HGBA1C 8.2 (H) 01/05/2020    Cardiac Studies & Procedures   CARDIAC CATHETERIZATION  CARDIAC CATHETERIZATION 02/26/2018  Narrative  Ost 1st Diag lesion is 75% stenosed. LIMA to diagonal is patent. Appears unchanged from prior cath.  Prox LAD lesion is 25% stenosed.  The left ventricular systolic function is normal.  LV end diastolic pressure is normal.  The left ventricular ejection fraction is 55-65% by visual estimate.  There is no aortic valve stenosis.  No change from prior cath.  Recommend Aspirin 81mg  daily for moderate CAD.  Continue aggressive secondary prevention.  Findings Coronary Findings Diagnostic  Dominance: Right  Left Anterior Descending Prox LAD lesion is 25% stenosed.  First Diagonal Branch Ost 1st Diag lesion is 75% stenosed.  Second Diagonal Branch  LIMA Graft To 1st Diag  Intervention  No interventions have been documented.    STRESS TESTS  MYOCARDIAL PERFUSION IMAGING 05/24/2020  Narrative  Nuclear stress EF: 62%.  No T wave inversion was noted during stress.  There was no ST segment deviation noted during stress.  This is a low risk study.  Normal perfusion. LVEF 62% with normal wall motion. This is a low risk study. Compared to a prior study in 2014, ischemia is no longer present.   ECHOCARDIOGRAM  ECHOCARDIOGRAM COMPLETE 01/29/2022  Narrative ECHOCARDIOGRAM REPORT    Patient Name:   PHINN STAUFF Date of Exam: 01/29/2022 Medical Rec #:  [id number]       Height:       69.0 in Accession #:    [id number]      Weight:       195.4 lb Date of Birth:  05-01-1946       BSA:          2.046 m Patient Age:    86 years        BP:           151/81 mmHg Patient Gender: M               HR:           83 bpm. Exam Location:  [place]  Procedure: 2D Echo, Cardiac Doppler, Color Doppler and Strain Analysis  Indications:    R06.02. SOB  History:        Patient has prior history of Echocardiogram examinations, most recent 05/24/2020. Prior CABG, COPD and CKD, Signs/Symptoms:Edema; Risk Factors:Hypertension, Sleep Apnea and Diabetes.  Sonographer:    Marygrace Drought RCS Referring Phys: [id number] [place]   1. Left ventricular ejection fraction, by estimation, is 55 to 60%. The left ventricle has normal function. The left ventricle has no regional wall motion abnormalities. Left ventricular diastolic parameters were normal. 2. Right ventricular systolic function is normal. The right ventricular size is normal. 3. The mitral valve is normal in structure. Mild mitral valve regurgitation. 4. The aortic valve is tricuspid. Aortic valve regurgitation is not visualized. Aortic valve sclerosis/calcification is present, without any evidence of aortic stenosis. 5. The inferior vena cava is normal in size with greater than 50% respiratory variability, suggesting right atrial pressure of 3  mmHg.  Comparison(s): The left ventricular function is unchanged.  FINDINGS Left Ventricle: Left ventricular ejection fraction, by estimation, is 55 to 60%. The left ventricle has normal function. The left ventricle has no regional wall motion abnormalities. The left ventricular internal cavity size was normal in size. There is no left ventricular hypertrophy. Left ventricular diastolic parameters were normal.  Right Ventricle: The right ventricular size is normal. Right vetricular wall thickness was not assessed. Right ventricular systolic function is normal.  Left Atrium: Left atrial size was normal in size.  Right Atrium: Right atrial size was normal in size.  Pericardium: There is no evidence of pericardial effusion.  Mitral Valve: The mitral valve is normal in structure. Mild mitral valve regurgitation.  Tricuspid Valve: The tricuspid valve is normal in structure. Tricuspid valve regurgitation is trivial.  Aortic Valve: The aortic valve is tricuspid. Aortic valve regurgitation is  not visualized. Aortic valve sclerosis/calcification is present, without any evidence of aortic stenosis.  Pulmonic Valve: The pulmonic valve was not well visualized. Pulmonic valve regurgitation is not visualized. No evidence of pulmonic stenosis.  Aorta: The aortic root and ascending aorta are structurally normal, with no evidence of dilitation.  Venous: The inferior vena cava is normal in size with greater than 50% respiratory variability, suggesting right atrial pressure of 3 mmHg.  IAS/Shunts: No atrial level shunt detected by color flow Doppler.   LEFT VENTRICLE PLAX 2D LVIDd:         3.80 cm   Diastology LVIDs:         2.70 cm   LV e' medial:    9.68 cm/s LV PW:         1.10 cm   LV E/e' medial:  8.1 LV IVS:        0.90 cm   LV e' lateral:   9.57 cm/s LVOT diam:     2.10 cm   LV E/e' lateral: 8.2 LV SV:         66 LV SV Index:   32        2D Longitudinal Strain LVOT Area:     3.46 cm  2D  Strain GLS (A2C):   -11.3 % 2D Strain GLS (A3C):   -19.9 % 2D Strain GLS (A4C):   -21.2 % 2D Strain GLS Avg:     -17.5 %  RIGHT VENTRICLE RV Basal diam:  3.50 cm RV S prime:     12.60 cm/s TAPSE (M-mode): 1.8 cm  LEFT ATRIUM             Index        RIGHT ATRIUM           Index LA diam:        3.20 cm 1.56 cm/m   RA Area:     12.50 cm LA Vol (A2C):   31.9 ml 15.59 ml/m  RA Volume:   30.10 ml  14.71 ml/m LA Vol (A4C):   24.4 ml 11.93 ml/m LA Biplane Vol: 28.7 ml 14.03 ml/m AORTIC VALVE LVOT Vmax:   98.20 cm/s LVOT Vmean:  65.300 cm/s LVOT VTI:    0.190 m  AORTA Ao Root diam: 3.40 cm Ao Asc diam:  3.30 cm  MITRAL VALVE MV Area (PHT):             SHUNTS MV Decel Time:             Systemic VTI:  0.19 m MV E velocity: 78.60 cm/s  Systemic Diam: 2.10 cm MV A velocity: 74.40 cm/s MV E/A ratio:  1.06  Dorris Carnes MD Electronically signed by Dorris Carnes MD Signature Date/Time: 01/29/2022/6:35:32 PM    Final             Assessment & Plan    1.  HFpEF/ Lower extremity edema: -Patient's last 2D echo was completed 01/2022 with an EF of 60% normal RV function with mild MV regurgitation -Today patient is euvolemic and in reviewing his creatinine from recent PCP visit it was elevated at 1.4 -We will change torsemide to as needed instead of every other day. -Low sodium diet, fluid restriction <2L, and daily weights encouraged. Educated to contact our office for weight gain of 2 lbs overnight or 5 lbs in one week.  -Encouraged to continue wearing compression stockings and elevate extremities when dependent     2.  COPD: -Patient recently seen by Dr.  Byrum, no medication changes at that time continue current therapies per pulmonology -Patient currently on triple therapy for COPD -He reports some shortness of breath with heavy exertion.   3.  Coronary artery disease: - s/p CABG in 1999 with most recent ischemic evaluation completed in 2019 -patient reports that he is free of  any anginal symptoms and denies any exertional chest discomfort -Current GDMT consist of Toprol 50 mg daily, ASA 81 mg daily, pravastatin 40 mg daily, WelChol 625 mg twice daily   4.  DM type II: -Follows closely with PCP and monitors his blood sugars daily   5.  CKD stage III: -Patient's most recent creatinine was 1.42 and GFR was less than 60 and 51.  We have adjusted diuretics as noted above.   Disposition: Follow-up with Candee Furbish, MD or APP in 3 months    Medication Adjustments/Labs and Tests Ordered: Current medicines are reviewed at length with the patient today.  Concerns regarding medicines are outlined above.   Signed, Mable Fill, Marissa Nestle, NP 10/30/2022, 3:44 PM [facility]  Note:  This document was prepared using Dragon voice recognition software and may include unintentional dictation errors.

## 2022-10-30 ENCOUNTER — Ambulatory Visit: Payer: Medicare Other | Attending: Nurse Practitioner | Admitting: Nurse Practitioner

## 2022-10-30 ENCOUNTER — Encounter: Payer: Self-pay | Admitting: Nurse Practitioner

## 2022-10-30 VITALS — BP 138/60 | HR 71 | Ht 69.0 in | Wt 180.8 lb

## 2022-10-30 DIAGNOSIS — I2510 Atherosclerotic heart disease of native coronary artery without angina pectoris: Secondary | ICD-10-CM | POA: Diagnosis not present

## 2022-10-30 DIAGNOSIS — J439 Emphysema, unspecified: Secondary | ICD-10-CM | POA: Diagnosis not present

## 2022-10-30 DIAGNOSIS — E02 Subclinical iodine-deficiency hypothyroidism: Secondary | ICD-10-CM | POA: Diagnosis not present

## 2022-10-30 DIAGNOSIS — R600 Localized edema: Secondary | ICD-10-CM | POA: Insufficient documentation

## 2022-10-30 DIAGNOSIS — I10 Essential (primary) hypertension: Secondary | ICD-10-CM | POA: Diagnosis not present

## 2022-10-30 DIAGNOSIS — I5032 Chronic diastolic (congestive) heart failure: Secondary | ICD-10-CM | POA: Insufficient documentation

## 2022-10-30 DIAGNOSIS — Z9641 Presence of insulin pump (external) (internal): Secondary | ICD-10-CM | POA: Diagnosis not present

## 2022-10-30 DIAGNOSIS — E1022 Type 1 diabetes mellitus with diabetic chronic kidney disease: Secondary | ICD-10-CM | POA: Diagnosis not present

## 2022-10-30 DIAGNOSIS — M19049 Primary osteoarthritis, unspecified hand: Secondary | ICD-10-CM | POA: Diagnosis not present

## 2022-10-30 DIAGNOSIS — N183 Chronic kidney disease, stage 3 unspecified: Secondary | ICD-10-CM | POA: Diagnosis not present

## 2022-10-30 DIAGNOSIS — E049 Nontoxic goiter, unspecified: Secondary | ICD-10-CM | POA: Diagnosis not present

## 2022-10-30 DIAGNOSIS — E7800 Pure hypercholesterolemia, unspecified: Secondary | ICD-10-CM | POA: Diagnosis not present

## 2022-10-30 DIAGNOSIS — E1165 Type 2 diabetes mellitus with hyperglycemia: Secondary | ICD-10-CM | POA: Diagnosis not present

## 2022-10-30 NOTE — Patient Instructions (Signed)
Medication Instructions:  Take Torsemide as needed *If you need a refill on your cardiac medications before your next appointment, please call your pharmacy*   Lab Work: None ordered   Testing/Procedures: None ordered   Follow-Up: At [facility], you and your health needs are our priority.  As part of our continuing mission to provide you with exceptional heart care, we have created designated Provider Care Teams.  These Care Teams include your primary Cardiologist (physician) and Advanced Practice Providers (APPs -  Physician Assistants and Nurse Practitioners) who all work together to provide you with the care you need, when you need it.  We recommend signing up for the patient portal called "MyChart".  Sign up information is provided on this After Visit Summary.  MyChart is used to connect with patients for Virtual Visits (Telemedicine).  Patients are able to view lab/test results, encounter notes, upcoming appointments, etc.  Non-urgent messages can be sent to your provider as well.   To learn more about what you can do with MyChart, go to [email or web address].    Your next appointment:   3 month(s)  Provider:   Ambrose Pancoast, NP       Other Instructions Please check your weight daily.  Please contact the office if you gain more than 3lbs in a day or 5lbs in a week. Limit your salt intake to 1500-2000mg  per day or 500mg  of Sodium per meal.  Drink 64oz of fluid daily.

## 2022-11-05 DIAGNOSIS — H25811 Combined forms of age-related cataract, right eye: Secondary | ICD-10-CM | POA: Diagnosis not present

## 2022-11-05 DIAGNOSIS — H25812 Combined forms of age-related cataract, left eye: Secondary | ICD-10-CM | POA: Diagnosis not present

## 2022-11-17 ENCOUNTER — Other Ambulatory Visit: Payer: Self-pay | Admitting: Cardiology

## 2022-11-20 DIAGNOSIS — Z01818 Encounter for other preprocedural examination: Secondary | ICD-10-CM | POA: Diagnosis not present

## 2022-11-20 DIAGNOSIS — H25811 Combined forms of age-related cataract, right eye: Secondary | ICD-10-CM | POA: Diagnosis not present

## 2022-11-20 DIAGNOSIS — H25812 Combined forms of age-related cataract, left eye: Secondary | ICD-10-CM | POA: Diagnosis not present

## 2022-11-21 ENCOUNTER — Telehealth: Payer: Self-pay | Admitting: Cardiology

## 2022-11-21 MED ORDER — POTASSIUM CHLORIDE ER 10 MEQ PO TBCR: 10.0000 meq | EXTENDED_RELEASE_TABLET | Freq: Every day | ORAL | 3 refills | Status: DC | PRN

## 2022-11-21 NOTE — Telephone Encounter (Signed)
Called patient about his message. Patient stated that he is only taking the torsemide as needed and does he need to continue taking potassium 10 meq daily. Consulted Robin Searing NP, recommend patient take potassium with torsemide. Called patient back. Patient verbalized understanding.

## 2022-11-21 NOTE — Telephone Encounter (Signed)
Pt c/o medication issue:  1. Name of Medication:   potassium chloride (KLOR-CON) 10 MEQ tablet    2. How are you currently taking this medication (dosage and times per day)?    3. Are you having a reaction (difficulty breathing--STAT)? no  4. What is your medication issue? Calling with questions concerning medication. Please advise

## 2022-11-25 ENCOUNTER — Other Ambulatory Visit: Payer: Self-pay | Admitting: Nurse Practitioner

## 2022-11-25 DIAGNOSIS — J449 Chronic obstructive pulmonary disease, unspecified: Secondary | ICD-10-CM

## 2022-12-06 DIAGNOSIS — H25811 Combined forms of age-related cataract, right eye: Secondary | ICD-10-CM | POA: Diagnosis not present

## 2022-12-06 DIAGNOSIS — H269 Unspecified cataract: Secondary | ICD-10-CM | POA: Diagnosis not present

## 2022-12-11 DIAGNOSIS — E02 Subclinical iodine-deficiency hypothyroidism: Secondary | ICD-10-CM | POA: Diagnosis not present

## 2022-12-18 ENCOUNTER — Other Ambulatory Visit: Payer: Self-pay | Admitting: Cardiology

## 2022-12-22 ENCOUNTER — Other Ambulatory Visit: Payer: Self-pay | Admitting: Nurse Practitioner

## 2022-12-22 DIAGNOSIS — J449 Chronic obstructive pulmonary disease, unspecified: Secondary | ICD-10-CM

## 2022-12-29 ENCOUNTER — Other Ambulatory Visit: Payer: Self-pay | Admitting: Nurse Practitioner

## 2022-12-29 DIAGNOSIS — J449 Chronic obstructive pulmonary disease, unspecified: Secondary | ICD-10-CM

## 2023-01-16 ENCOUNTER — Other Ambulatory Visit: Payer: Self-pay | Admitting: Cardiology

## 2023-01-24 DIAGNOSIS — E7800 Pure hypercholesterolemia, unspecified: Secondary | ICD-10-CM | POA: Diagnosis not present

## 2023-01-24 DIAGNOSIS — M19049 Primary osteoarthritis, unspecified hand: Secondary | ICD-10-CM | POA: Diagnosis not present

## 2023-01-24 DIAGNOSIS — E1165 Type 2 diabetes mellitus with hyperglycemia: Secondary | ICD-10-CM | POA: Diagnosis not present

## 2023-01-24 DIAGNOSIS — I10 Essential (primary) hypertension: Secondary | ICD-10-CM | POA: Diagnosis not present

## 2023-01-24 DIAGNOSIS — Z9641 Presence of insulin pump (external) (internal): Secondary | ICD-10-CM | POA: Diagnosis not present

## 2023-01-24 DIAGNOSIS — E049 Nontoxic goiter, unspecified: Secondary | ICD-10-CM | POA: Diagnosis not present

## 2023-01-24 DIAGNOSIS — E02 Subclinical iodine-deficiency hypothyroidism: Secondary | ICD-10-CM | POA: Diagnosis not present

## 2023-01-27 NOTE — Progress Notes (Unsigned)
Office Visit    Patient Name: Oscar Robinson Date of Encounter: 01/28/2023  Primary Care Provider:  Nonnie Robinson., MD Primary Cardiologist:  Oscar Schultz, MD Primary Electrophysiologist: None   Past Medical History    Past Medical History:  Diagnosis Date   Anemia    Anxiety    Arthritis    CKD (chronic kidney disease), stage II    COPD (chronic obstructive pulmonary disease) (HCC)    Coronary artery disease    a. s/p CABG 1999.   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   Procedure: DUPUYTREN CONTRACTURE RELEASE;  Surgeon: Wyn Forster., MD;  Location: [facility];  Service: Orthopedics;  Laterality: Right;  palm, ring and small fingers dupuytrens contracture release   HAND SURGERY     lt palm,ring finger,   HAND SURGERY Left    pinky finger   HERNIA REPAIR  1996   lt/rt ing hernia   LEFT HEART CATH AND CORS/GRAFTS ANGIOGRAPHY N/A 02/26/2018   Procedure: LEFT HEART CATH AND CORS/GRAFTS ANGIOGRAPHY;  Surgeon: Corky Crafts, MD;  Location: [facility];  Service: Cardiovascular;  Laterality: N/A;   LEFT HEART CATHETERIZATION WITH CORONARY/GRAFT ANGIOGRAM N/A 06/15/2013   Procedure: LEFT HEART CATHETERIZATION WITH Isabel Caprice;  Surgeon: Oscar Schultz, MD;  Location: [facility] CATH LAB;  Service: Cardiovascular;  Laterality: N/A;   UPPER GASTROINTESTINAL ENDOSCOPY       Allergies  No Known Allergies   History of Present Illness    Oscar Robinson is a 77 y.o. male with PMH of CAD s/p CABG 1999, LIMA to LAD, DM, HLD, HTN, thyroid disease, COPD, obstructive sleep apnea, GERD, CKD stage II who presents today for 45-month follow-up of CHF.   [name] was seen initially by Dr. Anne Robinson in 05/2013 for evaluation of dyspnea. His last LHC was 02/2018 which revealed LAD/diagonal bifurcation disease with patent LIMA to LAD/diagonal albeit small caliber vessel and no change from previous cath.  He underwent Lexiscan Myoview on 05/2020 that was normal with no ischemia present. 2D echo was performed 01/2022 with EF of 55-60%, in RWMA, no LVH, normal LV function. He was started on triple therapy for his COPD and referred to pulmonary rehab.  He was last seen on 10/30/2022 and was doing well with no SOB at rest.  He reported compliance with torsemide daily and was noted to be euvolemic on examination.  He was noted to have elevated creatinine of 1.4 and torsemide was switched to as needed.  His blood pressure was noted to be controlled and he denied any cardiac complaints otherwise.  Oscar Robinson today for 72-month follow-up.  Since last being seen in the office patient reports that he is been doing well with no  changes to his current cardiac or pulmonary health.  His blood pressure today is controlled at 124/64 and heart rate is 78 bpm.  He is euvolemic on examination and weight is currently stable.  He reports compliance with his current medications and denies any adverse reactions.  He is remaining to stay active and is eating a balanced diet with salads and drinking adequate amount of water.  He reported feeling decreased energy over the past few months.  He was found to have elevated TSH and Synthroid was increased recently by endocrinology.  Patient denies chest pain, palpitations, dyspnea, PND, orthopnea, nausea, vomiting, dizziness, syncope, edema, weight gain, or early  satiety.   Home Medications    Current Outpatient Medications  Medication Sig Dispense Refill   albuterol (PROVENTIL) (2.5 MG/3ML) 0.083% nebulizer solution Take 3 mLs (2.5 mg total) by nebulization every 6 (six) hours as needed for wheezing or shortness of breath. 360 mL 5   albuterol (VENTOLIN HFA) 108 (90 Base) MCG/ACT inhaler INHALE 2 PUFFS BY MOUTH EVERY 4 HOURS AS NEEDED FOR WHEEZING OR SHORTNESS OF BREATH 9 g 2   ALPRAZolam (XANAX) 0.5 MG tablet Take 0.5 mg by mouth at bedtime.      antiseptic oral rinse (BIOTENE) LIQD 15 mLs by Mouth Rinse route daily.     aspirin 81 MG tablet Take 81 mg by mouth at bedtime.      Coenzyme Q10 (CO Q 10) 100 MG CAPS Take 100 mg by mouth at bedtime.      colesevelam (WELCHOL) 625 MG tablet TAKE 3 TABLETS BY MOUTH TWICE DAILY WITH MEALS 540 tablet 3   dextromethorphan-guaiFENesin (MUCINEX DM) 30-600 MG 12hr tablet Take 1 tablet by mouth 2 (two) times daily. 60 tablet 0   doxepin (SINEQUAN) 50 MG capsule Take 100 mg by mouth at bedtime.      esomeprazole (NEXIUM) 20 MG capsule Take 20 mg by mouth 2 (two) times daily.     fluticasone (FLONASE) 50 MCG/ACT nasal spray Use 1 spray(s) in each nostril once daily 16 g 3   HUMALOG 100 UNIT/ML injection 80 Units.   6   hydrochlorothiazide (HYDRODIURIL) 25 MG tablet Take 1 tablet by mouth once daily 90 tablet 3   levothyroxine (SYNTHROID) 175 MCG tablet Take 175 mcg by mouth every morning.     loratadine (CLARITIN) 10 MG tablet Take 1 tablet (10 mg total) by mouth daily. 30 tablet 2   losartan (COZAAR) 100 MG tablet Take 1 tablet by mouth once daily 90 tablet 3   metFORMIN (GLUCOPHAGE) 500 MG tablet Take 1 tablet (500 mg total) by mouth 2 (two) times daily with a meal.     metoprolol succinate (TOPROL-XL) 50 MG 24 hr tablet TAKE 1 TABLET BY MOUTH ONCE DAILY WITH OR IMMEDIATELY FOLOWING A MEAL 90 tablet 3   Multiple Vitamins-Minerals (CENTRUM SILVER 50+MEN) TABS Take 1 tablet by mouth daily.     nitroGLYCERIN  (NITROSTAT) 0.4 MG SL tablet Place 1 tablet (0.4 mg total) under the tongue every 5 (five) minutes as needed for chest pain. 25 tablet 3   Omega-3 Fatty Acids (FISH OIL) 600 MG CAPS Take 1,400 mg by mouth at bedtime.      potassium chloride (KLOR-CON) 10 MEQ tablet Take 1 tablet (10 mEq total) by mouth daily as needed (when taking torsemide). 90 tablet 3   pravastatin (PRAVACHOL) 40 MG tablet Take 1 tablet by mouth once daily 90 tablet 3   SYMBICORT 160-4.5 MCG/ACT inhaler Inhale 2 puffs  by mouth twice daily 11 g 3   tamsulosin (FLOMAX) 0.4 MG CAPS capsule SMARTSIG:1 Capsule(s) By Mouth Every Evening     Tiotropium Bromide Monohydrate (SPIRIVA RESPIMAT) 2.5 MCG/ACT AERS INHALE 2 SPRAY(S) BY MOUTH ONCE DAILY 4 g 2   torsemide (DEMADEX) 20 MG tablet Take 20 mg by mouth daily as needed. Take as needed for weight gain of 2lbs in a day or 5lbs in a week     Current Facility-Administered Medications  Medication Dose Route Frequency Provider Last Rate Last Admin   0.9 %  sodium chloride infusion  500 mL Intravenous Continuous Rachael Fee, MD         Review of Systems  Please see the history of present illness.    (+) Fatigue (+) Shortness of breath with heavy exertion  All other systems reviewed and are otherwise negative except as noted above.  Physical Exam    Wt Readings from Last 3 Encounters:  01/28/23 178 lb 9.6 oz (81 kg)  10/30/22 180 lb 12.8 oz (82 kg)  05/31/22 183 lb (83 kg)   VS: Vitals:   01/28/23 1545  BP: 124/64  Pulse: 78  SpO2: 93%  ,Body mass index is 26.37 kg/m.  Constitutional:      Appearance: Healthy appearance. Not in distress.  Neck:     Vascular: JVD normal.  Pulmonary:     Effort: Pulmonary effort is normal.     Breath sounds: No wheezing. No rales. Diminished in the bases Cardiovascular:     Normal rate. Regular rhythm. Normal S1. Normal S2.      Murmurs: There is no murmur.  Edema:    Peripheral edema absent.  Abdominal:     Palpations:  Abdomen is soft non tender. There is no hepatomegaly.  Skin:    General: Skin is warm and dry.  Neurological:     General: No focal deficit present.     Mental Status: Alert and oriented to person, place and time.     Cranial Nerves: Cranial nerves are intact.  EKG/LABS/ Recent Cardiac Studies    ECG personally reviewed by me today -none completed today  Cardiac Studies & Procedures   CARDIAC CATHETERIZATION  CARDIAC CATHETERIZATION 02/26/2018  Narrative  Ost 1st Diag lesion is 75% stenosed. LIMA to diagonal is patent. Appears unchanged from prior cath.  Prox LAD lesion is 25% stenosed.  The left ventricular systolic function is normal.  LV end diastolic pressure is normal.  The left ventricular ejection fraction is 55-65% by visual estimate.  There is no aortic valve stenosis.  No change from prior cath.  Recommend Aspirin 81mg  daily for moderate CAD.  Continue aggressive secondary prevention.  Findings Coronary Findings Diagnostic  Dominance: Right  Left Anterior Descending Prox LAD lesion is 25% stenosed.  First Diagonal Branch Ost 1st Diag lesion is 75% stenosed.  Second Diagonal Branch  LIMA Graft To 1st Diag  Intervention  No interventions have been documented.   STRESS TESTS  MYOCARDIAL PERFUSION IMAGING 05/24/2020  Narrative  Nuclear stress EF: 62%.  No T wave inversion was noted during stress.  There was no ST segment deviation noted during stress.  This is a low risk study.  Normal perfusion. LVEF 62% with normal wall motion. This is a low risk study. Compared to a prior study in 2014, ischemia is no longer present.   ECHOCARDIOGRAM  ECHOCARDIOGRAM COMPLETE 01/29/2022  Narrative ECHOCARDIOGRAM REPORT    Patient Name:   Oscar Robinson Date of Exam:  01/29/2022 Medical Rec #:  [id number]       Height:       69.0 in Accession #:    [id number]      Weight:       195.4 lb Date of Birth:  04-24-1946       BSA:          2.046 m Patient  Age:    76 years        BP:           151/81 mmHg Patient Gender: M               HR:           83 bpm. Exam Location:  [address]  Procedure: 2D Echo, Cardiac Doppler, Color Doppler and Strain Analysis  Indications:    R06.02. SOB  History:        Patient has prior history of Echocardiogram examinations, most recent 05/24/2020. Prior CABG, COPD and CKD, Signs/Symptoms:Edema; Risk Factors:Hypertension, Sleep Apnea and Diabetes.  Sonographer:    Clearence Ped RCS Referring Phys: [id number] Ruby Cola COBB  IMPRESSIONS   1. Left ventricular ejection fraction, by estimation, is 55 to 60%. The left ventricle has normal function. The left ventricle has no regional wall motion abnormalities. Left ventricular diastolic parameters were normal. 2. Right ventricular systolic function is normal. The right ventricular size is normal. 3. The mitral valve is normal in structure. Mild mitral valve regurgitation. 4. The aortic valve is tricuspid. Aortic valve regurgitation is not visualized. Aortic valve sclerosis/calcification is present, without any evidence of aortic stenosis. 5. The inferior vena cava is normal in size with greater than 50% respiratory variability, suggesting right atrial pressure of 3 mmHg.  Comparison(s): The left ventricular function is unchanged.  FINDINGS Left Ventricle: Left ventricular ejection fraction, by estimation, is 55 to 60%. The left ventricle has normal function. The left ventricle has no regional wall motion abnormalities. The left ventricular internal cavity size was normal in size. There is no left ventricular hypertrophy. Left ventricular diastolic parameters were normal.  Right Ventricle: The right ventricular size is normal. Right vetricular wall thickness was not assessed. Right ventricular systolic function is normal.  Left Atrium: Left atrial size was normal in size.  Right Atrium: Right atrial size was normal in size.  Pericardium: There is no  evidence of pericardial effusion.  Mitral Valve: The mitral valve is normal in structure. Mild mitral valve regurgitation.  Tricuspid Valve: The tricuspid valve is normal in structure. Tricuspid valve regurgitation is trivial.  Aortic Valve: The aortic valve is tricuspid. Aortic valve regurgitation is not visualized. Aortic valve sclerosis/calcification is present, without any evidence of aortic stenosis.  Pulmonic Valve: The pulmonic valve was not well visualized. Pulmonic valve regurgitation is not visualized. No evidence of pulmonic stenosis.  Aorta: The aortic root and ascending aorta are structurally normal, with no evidence of dilitation.  Venous: The inferior vena cava is normal in size with greater than 50% respiratory variability, suggesting right atrial pressure of 3 mmHg.  IAS/Shunts: No atrial level shunt detected by color flow Doppler.   LEFT VENTRICLE PLAX 2D LVIDd:         3.80 cm   Diastology LVIDs:         2.70 cm   LV e' medial:    9.68 cm/s LV PW:         1.10 cm   LV E/e' medial:  8.1 LV IVS:  0.90 cm   LV e' lateral:   9.57 cm/s LVOT diam:     2.10 cm   LV E/e' lateral: 8.2 LV SV:         66 LV SV Index:   32        2D Longitudinal Strain LVOT Area:     3.46 cm  2D Strain GLS (A2C):   -11.3 % 2D Strain GLS (A3C):   -19.9 % 2D Strain GLS (A4C):   -21.2 % 2D Strain GLS Avg:     -17.5 %  RIGHT VENTRICLE RV Basal diam:  3.50 cm RV S prime:     12.60 cm/s TAPSE (M-mode): 1.8 cm  LEFT ATRIUM             Index        RIGHT ATRIUM           Index LA diam:        3.20 cm 1.56 cm/m   RA Area:     12.50 cm LA Vol (A2C):   31.9 ml 15.59 ml/m  RA Volume:   30.10 ml  14.71 ml/m LA Vol (A4C):   24.4 ml 11.93 ml/m LA Biplane Vol: 28.7 ml 14.03 ml/m AORTIC VALVE LVOT Vmax:   98.20 cm/s LVOT Vmean:  65.300 cm/s LVOT VTI:    0.190 m  AORTA Ao Root diam: 3.40 cm Ao Asc diam:  3.30 cm  MITRAL VALVE MV Area (PHT):             SHUNTS MV Decel Time:              Systemic VTI:  0.19 m MV E velocity: 78.60 cm/s  Systemic Diam: 2.10 cm MV A velocity: 74.40 cm/s MV E/A ratio:  1.06  Dietrich Pates MD Electronically signed by Dietrich Pates MD Signature Date/Time: 01/29/2022/6:35:32 PM    Final             Lab Results  Component Value Date   WBC 7.2 03/14/2021   HGB 12.9 (L) 03/14/2021   HCT 39.2 03/14/2021   MCV 92 03/14/2021   PLT 227 03/14/2021   Lab Results  Component Value Date   CREATININE 1.33 (H) 04/25/2022   BUN 13 04/25/2022   NA 142 04/25/2022   K 4.0 04/25/2022   CL 98 04/25/2022   CO2 28 04/25/2022   Lab Results  Component Value Date   ALT 20 03/14/2021   AST 22 03/14/2021   ALKPHOS 65 03/14/2021   BILITOT 0.3 03/14/2021   Lab Results  Component Value Date   CHOL 143 03/14/2021   HDL 57 03/14/2021   LDLCALC 67 03/14/2021   TRIG 107 03/14/2021   CHOLHDL 2.5 03/14/2021    Lab Results  Component Value Date   HGBA1C 8.2 (H) 01/05/2020     Assessment & Plan    1.HFpEF/ Lower extremity edema: -Patient's last 2D echo was completed 01/2022 with an EF of 60% normal RV function with mild MV regurgitation -Today patient is euvolemic on examination. -Continue as needed torsemide, losartan 100 mg daily, Toprol 50 mg daily -Low sodium diet, fluid restriction <2L, and daily weights encouraged. Educated to contact our office for weight gain of 2 lbs overnight or 5 lbs in one week.   2.Coronary artery disease: - s/p CABG in 1999 with most recent ischemic evaluation completed in 2019 -Continue GDMT with ASA 81 mg daily, Toprol 50 mg daily, WelChol 625 mg, fish oil 600 mg, and Pravachol 40 mg  daily -Patient was encouraged to increase physical activity 250 minutes/week as tolerated  3.COPD:  -Patient previously on triple therapy for management of COPD -He is currently followed by Dr. Delton Coombes  4.DM type II:  -Follows closely with PCP and monitors his blood sugars dai   5. CKD stage III:  -Patient's most recent  creatinine was 1.42  -We will recheck BMET today and patient will have referral placed to nephrology if creatinine is elevated.  Disposition: Follow-up with Oscar Schultz, MD or APP in 6 months    Medication Adjustments/Labs and Tests Ordered: Current medicines are reviewed at length with the patient today.  Concerns regarding medicines are outlined above.   Signed, Napoleon Form, Leodis Rains, NP 01/28/2023, 4:35 PM [facility]

## 2023-01-28 ENCOUNTER — Ambulatory Visit: Payer: Medicare Other | Attending: Nurse Practitioner | Admitting: Nurse Practitioner

## 2023-01-28 ENCOUNTER — Encounter: Payer: Self-pay | Admitting: Nurse Practitioner

## 2023-01-28 VITALS — BP 124/64 | HR 78 | Ht 69.0 in | Wt 178.6 lb

## 2023-01-28 DIAGNOSIS — J439 Emphysema, unspecified: Secondary | ICD-10-CM | POA: Diagnosis not present

## 2023-01-28 DIAGNOSIS — I25709 Atherosclerosis of coronary artery bypass graft(s), unspecified, with unspecified angina pectoris: Secondary | ICD-10-CM | POA: Insufficient documentation

## 2023-01-28 DIAGNOSIS — E7800 Pure hypercholesterolemia, unspecified: Secondary | ICD-10-CM | POA: Diagnosis not present

## 2023-01-28 DIAGNOSIS — I130 Hypertensive heart and chronic kidney disease with heart failure and stage 1 through stage 4 chronic kidney disease, or unspecified chronic kidney disease: Secondary | ICD-10-CM | POA: Diagnosis not present

## 2023-01-28 DIAGNOSIS — I5032 Chronic diastolic (congestive) heart failure: Secondary | ICD-10-CM | POA: Insufficient documentation

## 2023-01-28 DIAGNOSIS — N183 Chronic kidney disease, stage 3 unspecified: Secondary | ICD-10-CM | POA: Diagnosis not present

## 2023-01-28 DIAGNOSIS — I5030 Unspecified diastolic (congestive) heart failure: Secondary | ICD-10-CM | POA: Insufficient documentation

## 2023-01-28 MED ORDER — LOSARTAN POTASSIUM 100 MG PO TABS: 100.0000 mg | ORAL_TABLET | Freq: Every day | ORAL | 3 refills | Status: DC

## 2023-01-28 NOTE — Patient Instructions (Signed)
Medication Instructions:  [facility]ME[facility] RED FISH OIL *If you need a refill on your cardiac medications before your next appointment, please call your pharmacy*   Lab Work: TODAY-BMET If you have labs (blood work) drawn today and your tests are completely normal, you will receive your results only by: MyChart Message (if you have MyChart) OR A paper copy in the mail If you have any lab test that is abnormal or we need to change your treatment, we will call you to review the results.   Testing/Procedures: NONE ORDERED   Follow-Up: At [facility], you and your health needs are our priority.  As part of our continuing mission to provide you with exceptional heart care, we have created designated Provider Care Teams.  These Care Teams include your primary Cardiologist (physician) and Advanced Practice Providers (APPs -  Physician Assistants and Nurse Practitioners) who all work together to provide you with the care you need, when you need it.  We recommend signing up for the patient portal called "MyChart".  Sign up information is provided on this After Visit Summary.  MyChart is used to connect with patients for Virtual Visits (Telemedicine).  Patients are able to view lab/test results, encounter notes, upcoming appointments, etc.  Non-urgent messages can be sent to your provider as well.   To learn more about what you can do with MyChart, go to [email or web address].    Your next appointment:   6 month(s)  Provider:   Donato Schultz, MD     Other Instructions

## 2023-01-29 LAB — BASIC METABOLIC PANEL
BUN/Creatinine Ratio: 12 (ref 10–24)
BUN: 15 mg/dL (ref 8–27)
CO2: 21 mmol/L (ref 20–29)
Calcium: 9.4 mg/dL (ref 8.6–10.2)
Chloride: 99 mmol/L (ref 96–106)
Creatinine, Ser: 1.24 mg/dL (ref 0.76–1.27)
Glucose: 215 mg/dL — ABNORMAL HIGH (ref 70–99)
Potassium: 4.5 mmol/L (ref 3.5–5.2)
Sodium: 137 mmol/L (ref 134–144)
eGFR: 60 mL/min/{1.73_m2} (ref >59)

## 2023-02-05 DIAGNOSIS — I10 Essential (primary) hypertension: Secondary | ICD-10-CM | POA: Diagnosis not present

## 2023-02-05 DIAGNOSIS — F411 Generalized anxiety disorder: Secondary | ICD-10-CM | POA: Diagnosis not present

## 2023-02-05 DIAGNOSIS — E7801 Familial hypercholesterolemia: Secondary | ICD-10-CM | POA: Diagnosis not present

## 2023-02-05 DIAGNOSIS — Z23 Encounter for immunization: Secondary | ICD-10-CM | POA: Diagnosis not present

## 2023-02-05 DIAGNOSIS — Z79899 Other long term (current) drug therapy: Secondary | ICD-10-CM | POA: Diagnosis not present

## 2023-02-05 DIAGNOSIS — Z125 Encounter for screening for malignant neoplasm of prostate: Secondary | ICD-10-CM | POA: Diagnosis not present

## 2023-02-05 DIAGNOSIS — Z0000 Encounter for general adult medical examination without abnormal findings: Secondary | ICD-10-CM | POA: Diagnosis not present

## 2023-02-19 DIAGNOSIS — M7741 Metatarsalgia, right foot: Secondary | ICD-10-CM | POA: Diagnosis not present

## 2023-02-19 DIAGNOSIS — E119 Type 2 diabetes mellitus without complications: Secondary | ICD-10-CM | POA: Diagnosis not present

## 2023-02-19 DIAGNOSIS — M7742 Metatarsalgia, left foot: Secondary | ICD-10-CM | POA: Diagnosis not present

## 2023-02-26 IMAGING — DX DG CHEST 2V
2 series · 2 of 2 positions shown · non-contrast
Comparison: 09/26/2021

CLINICAL DATA: Shortness of breath and bilateral lower extremity
edema.

EXAM:
CHEST - 2 VIEW

[chest pa]
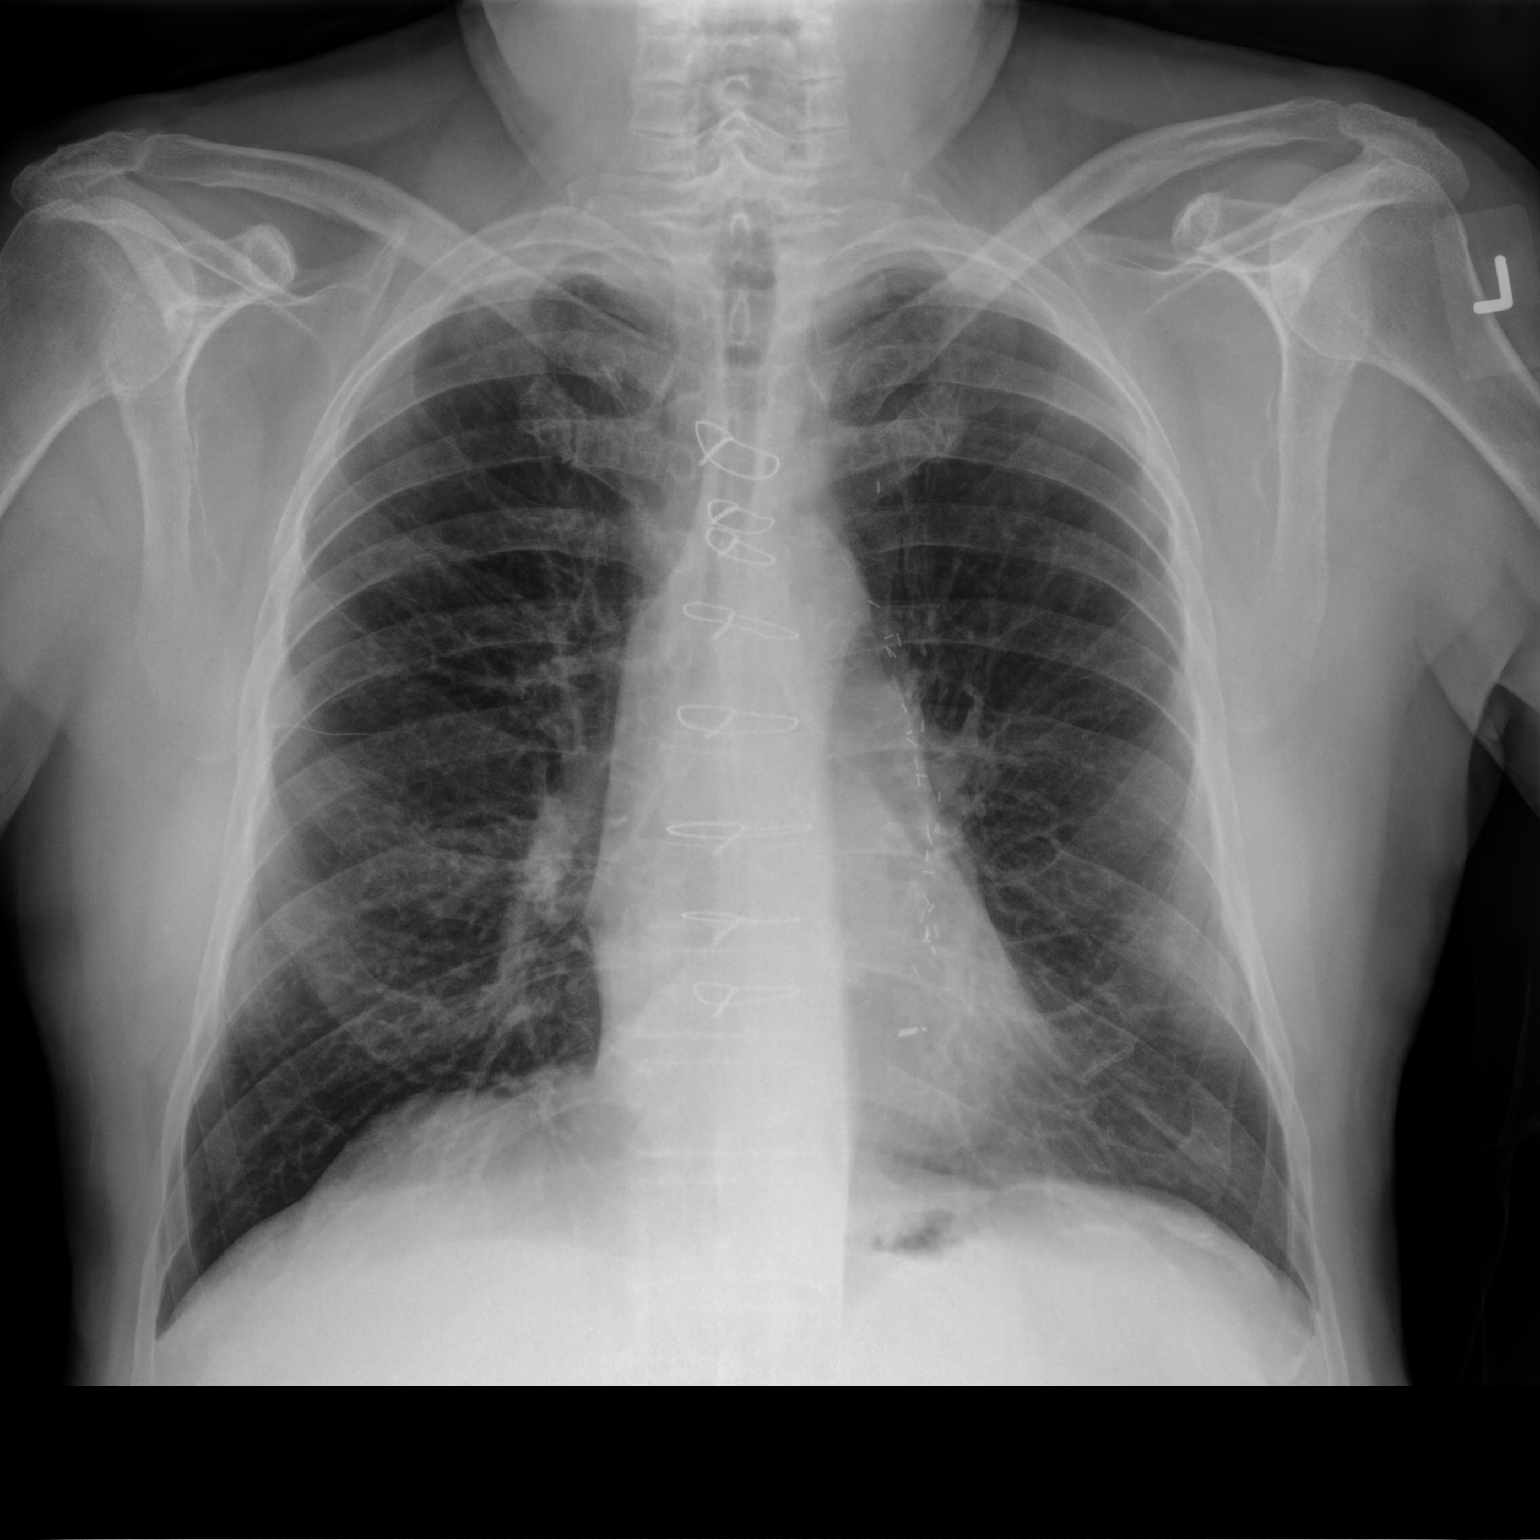

[chest lat]
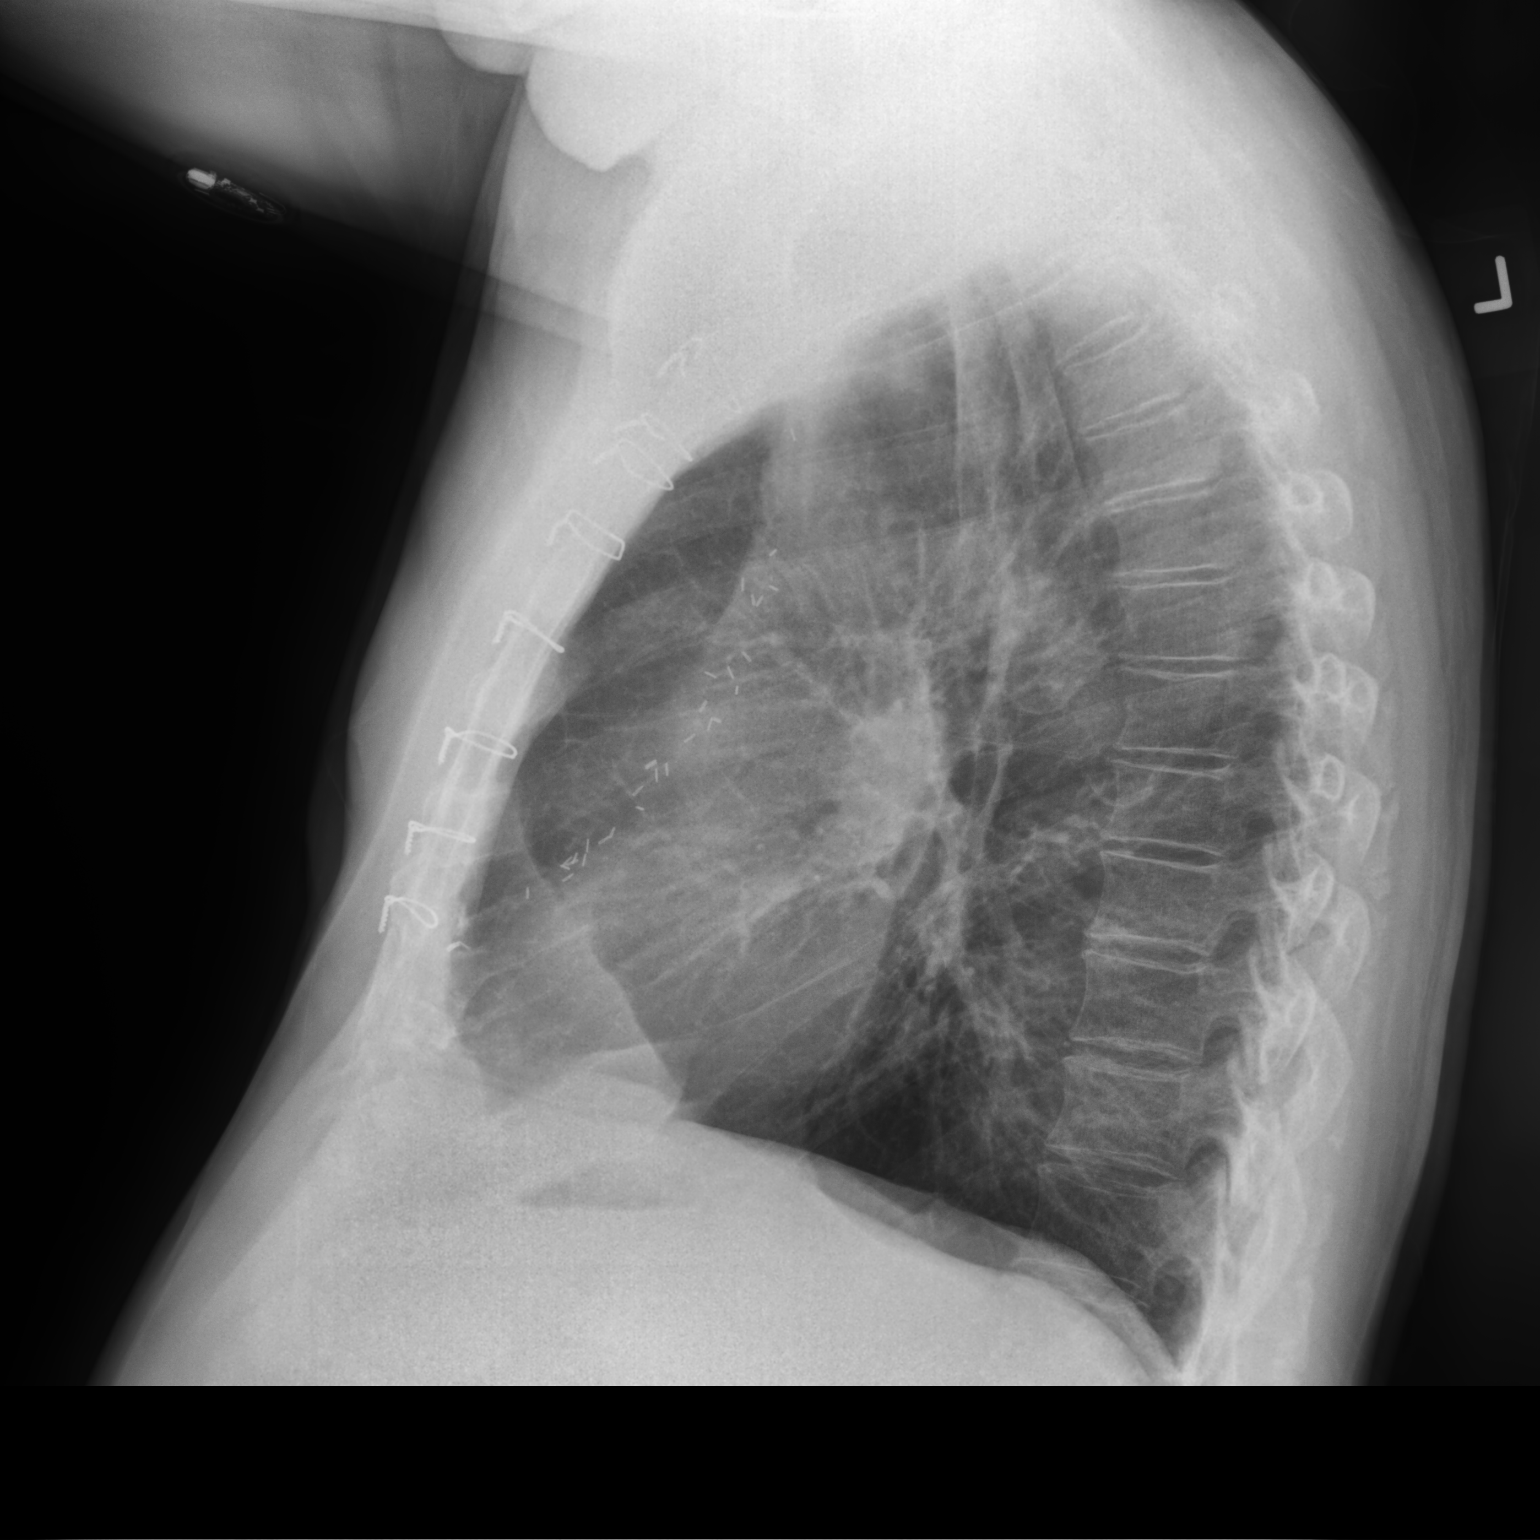

[2 of 2 positions shown; findings below may reference images not displayed]

FINDINGS: Previous median sternotomy and CABG procedure. Heart size is normal.
The lungs are hyperinflated. Coarsened interstitial markings of
emphysema noted. No pleural effusion, interstitial edema or airspace
consolidation. The visualized osseous structures are unremarkable.
IMPRESSION: No active cardiopulmonary abnormalities.

Emphysema ([id number]).

## 2023-03-02 ENCOUNTER — Other Ambulatory Visit: Payer: Self-pay | Admitting: Cardiology

## 2023-03-23 DIAGNOSIS — R0902 Hypoxemia: Secondary | ICD-10-CM | POA: Diagnosis not present

## 2023-03-23 DIAGNOSIS — R61 Generalized hyperhidrosis: Secondary | ICD-10-CM | POA: Diagnosis not present

## 2023-03-23 DIAGNOSIS — E161 Other hypoglycemia: Secondary | ICD-10-CM | POA: Diagnosis not present

## 2023-04-25 ENCOUNTER — Other Ambulatory Visit: Payer: Self-pay | Admitting: Nurse Practitioner

## 2023-04-25 DIAGNOSIS — E02 Subclinical iodine-deficiency hypothyroidism: Secondary | ICD-10-CM | POA: Diagnosis not present

## 2023-04-25 DIAGNOSIS — E1165 Type 2 diabetes mellitus with hyperglycemia: Secondary | ICD-10-CM | POA: Diagnosis not present

## 2023-04-25 DIAGNOSIS — E049 Nontoxic goiter, unspecified: Secondary | ICD-10-CM | POA: Diagnosis not present

## 2023-04-25 DIAGNOSIS — Z9641 Presence of insulin pump (external) (internal): Secondary | ICD-10-CM | POA: Diagnosis not present

## 2023-04-25 DIAGNOSIS — I10 Essential (primary) hypertension: Secondary | ICD-10-CM | POA: Diagnosis not present

## 2023-04-25 DIAGNOSIS — Z23 Encounter for immunization: Secondary | ICD-10-CM | POA: Diagnosis not present

## 2023-04-25 DIAGNOSIS — E7800 Pure hypercholesterolemia, unspecified: Secondary | ICD-10-CM | POA: Diagnosis not present

## 2023-05-01 ENCOUNTER — Telehealth: Payer: Self-pay | Admitting: Nurse Practitioner

## 2023-05-01 MED ORDER — POTASSIUM CHLORIDE ER 10 MEQ PO TBCR: 10.0000 meq | EXTENDED_RELEASE_TABLET | Freq: Every day | ORAL | 2 refills | Status: DC | PRN

## 2023-05-01 NOTE — Telephone Encounter (Signed)
Pt's medication was sent to pt's pharmacy as requested. Confirmation received.  °

## 2023-05-01 NOTE — Telephone Encounter (Signed)
*  STAT* If patient is at the pharmacy, call can be transferred to refill team.   1. Which medications need to be refilled? (please list name of each medication and dose if known) Potassium Chloride   2. Would you like to learn more about the convenience, safety, & potential cost savings by using the [facility]?     3. Are you open to using the [facility] (Type [facility]. .   4. Which pharmacy/location (including street and city if local pharmacy) is medication to be sent to?[address], [place]   5. Do they need a 30 day or 90 day supply? 90 days and refills- need this called today, out of itout

## 2023-06-18 DIAGNOSIS — K130 Diseases of lips: Secondary | ICD-10-CM | POA: Diagnosis not present

## 2023-06-27 DIAGNOSIS — L820 Inflamed seborrheic keratosis: Secondary | ICD-10-CM | POA: Diagnosis not present

## 2023-06-27 DIAGNOSIS — D2239 Melanocytic nevi of other parts of face: Secondary | ICD-10-CM | POA: Diagnosis not present

## 2023-06-30 ENCOUNTER — Other Ambulatory Visit: Payer: Self-pay | Admitting: Nurse Practitioner

## 2023-07-09 DIAGNOSIS — R972 Elevated prostate specific antigen [PSA]: Secondary | ICD-10-CM | POA: Diagnosis not present

## 2023-07-09 DIAGNOSIS — E02 Subclinical iodine-deficiency hypothyroidism: Secondary | ICD-10-CM | POA: Diagnosis not present

## 2023-07-09 DIAGNOSIS — E1165 Type 2 diabetes mellitus with hyperglycemia: Secondary | ICD-10-CM | POA: Diagnosis not present

## 2023-07-09 DIAGNOSIS — E7800 Pure hypercholesterolemia, unspecified: Secondary | ICD-10-CM | POA: Diagnosis not present

## 2023-07-10 LAB — COMPREHENSIVE METABOLIC PANEL: EGFR: 48

## 2023-07-16 DIAGNOSIS — M19049 Primary osteoarthritis, unspecified hand: Secondary | ICD-10-CM | POA: Diagnosis not present

## 2023-07-16 DIAGNOSIS — Z9641 Presence of insulin pump (external) (internal): Secondary | ICD-10-CM | POA: Diagnosis not present

## 2023-07-16 DIAGNOSIS — I10 Essential (primary) hypertension: Secondary | ICD-10-CM | POA: Diagnosis not present

## 2023-07-16 DIAGNOSIS — E1165 Type 2 diabetes mellitus with hyperglycemia: Secondary | ICD-10-CM | POA: Diagnosis not present

## 2023-07-16 DIAGNOSIS — E02 Subclinical iodine-deficiency hypothyroidism: Secondary | ICD-10-CM | POA: Diagnosis not present

## 2023-07-16 DIAGNOSIS — E7800 Pure hypercholesterolemia, unspecified: Secondary | ICD-10-CM | POA: Diagnosis not present

## 2023-07-16 DIAGNOSIS — E049 Nontoxic goiter, unspecified: Secondary | ICD-10-CM | POA: Diagnosis not present

## 2023-07-18 DIAGNOSIS — E119 Type 2 diabetes mellitus without complications: Secondary | ICD-10-CM | POA: Diagnosis not present

## 2023-07-18 DIAGNOSIS — R0989 Other specified symptoms and signs involving the circulatory and respiratory systems: Secondary | ICD-10-CM | POA: Diagnosis not present

## 2023-07-22 ENCOUNTER — Ambulatory Visit: Payer: Medicare Other | Attending: Cardiology | Admitting: Cardiology

## 2023-07-22 ENCOUNTER — Encounter: Payer: Self-pay | Admitting: Cardiology

## 2023-07-22 VITALS — BP 138/60 | HR 71 | Ht 69.0 in | Wt 179.2 lb

## 2023-07-22 DIAGNOSIS — I25709 Atherosclerosis of coronary artery bypass graft(s), unspecified, with unspecified angina pectoris: Secondary | ICD-10-CM

## 2023-07-22 DIAGNOSIS — N182 Chronic kidney disease, stage 2 (mild): Secondary | ICD-10-CM

## 2023-07-22 DIAGNOSIS — I5032 Chronic diastolic (congestive) heart failure: Secondary | ICD-10-CM

## 2023-07-22 DIAGNOSIS — Z09 Encounter for follow-up examination after completed treatment for conditions other than malignant neoplasm: Secondary | ICD-10-CM | POA: Diagnosis not present

## 2023-07-22 NOTE — Patient Instructions (Signed)
Medication Instructions:  The current medical regimen is effective;  continue present plan and medications.  *If you need a refill on your cardiac medications before your next appointment, please call your pharmacy*  Follow-Up: At [facility], you and your health needs are our priority.  As part of our continuing mission to provide you with exceptional heart care, we have created designated Provider Care Teams.  These Care Teams include your primary Cardiologist (physician) and Advanced Practice Providers (APPs -  Physician Assistants and Nurse Practitioners) who all work together to provide you with the care you need, when you need it.  We recommend signing up for the patient portal called "MyChart".  Sign up information is provided on this After Visit Summary.  MyChart is used to connect with patients for Virtual Visits (Telemedicine).  Patients are able to view lab/test results, encounter notes, upcoming appointments, etc.  Non-urgent messages can be sent to your provider as well.   To learn more about what you can do with MyChart, go to [email or web address].    Your next appointment:   6 month(s)  Provider:   Ernest Dick, NP     Then, Mark Skains, MD will plan to see you again in 1 year(s).     

## 2023-07-23 NOTE — Progress Notes (Signed)
Cardiology Office Note:  .   Date:  07/23/2023  ID:  Oscar Robinson, DOB [birth year], MRN [id number] PCP: Nonnie Done., MD  [facility] Providers Cardiologist:  Donato Schultz, MD {  History of Present Illness: .   Oscar Robinson is a 77 y.o. male Discussed with the use of AI scribe   History of Present Illness   The patient, a 76 year old male with a history of diastolic heart failure, lower extremity edema, COPD, and CKD stage 3A, presents with concerns about decreased energy levels. He reports feeling sluggish and lacking the vitality he once had. Despite this, he has been maintaining an active lifestyle, including regular gym visits three to four times a week.  The patient has been managing his fluid levels well, with only mild edema noted. He has been adhering to his medication regimen, which includes torsemide, losartan 100mg  daily, and Toprol 50mg  daily. He also has a history of bypass surgery in 1999 and has been on aspirin 81, Welchol 625, fish oil 600mg , and Pravachol 40mg  daily since then.  The patient has been vigilant about his kidney function, with recent creatinine levels ranging from 1.4 to 1.8. There have been concerns over creatinine from Dr. Talmage Nap.  The patient has also been mindful of his salt intake, making dietary adjustments where possible. He has quit his previous job, which involved heavy exposure to dust and other lung irritants, and has noticed an improvement in his respiratory symptoms since then.  Despite his chronic conditions and decreased energy levels, the patient remains proactive in managing his health and maintaining his physical activity.           Studies Reviewed: Marland Kitchen   EKG Interpretation Date/Time:  Monday July 22 2023 15:53:36 EST Ventricular Rate:  71 PR Interval:  162 QRS Duration:  94 QT Interval:  394 QTC Calculation: 428 R Axis:   54  Text Interpretation: Normal sinus rhythm Normal ECG When compared with ECG of  11-Jun-2013 12:17, Nonspecific T wave abnormality now evident in Inferior leads Confirmed by Donato Schultz ([id number]) on 07/22/2023 4:12:24 PM    Results   LABS Creatinine: 1.34 (02/05/2023) Creatinine: 1.5 (07/10/2023) Creatinine: 1.42 Sodium: 139 Potassium: 4.1  DIAGNOSTIC Echocardiogram: EF 60%, normal artery function, mild mitral valve regurgitation (01/2022) Cardiac catheterization: Main front bypass was patent (2019)     Risk Assessment/Calculations:            Physical Exam:   VS:  BP 138/60   Pulse 71   Ht 5\' 9"  (1.753 m)   Wt 179 lb 3.2 oz (81.3 kg)   BMI 26.46 kg/m    Wt Readings from Last 3 Encounters:  07/22/23 179 lb 3.2 oz (81.3 kg)  01/28/23 178 lb 9.6 oz (81 kg)  10/30/22 180 lb 12.8 oz (82 kg)    GEN: Well nourished, well developed in no acute distress NECK: No JVD; No carotid bruits CARDIAC: RRR, no murmurs, no rubs, no gallops RESPIRATORY:  Clear to auscultation without rales, wheezing or rhonchi  ABDOMEN: Soft, non-tender, non-distended EXTREMITIES:  1*2+ LE edema; No deformity   ASSESSMENT AND PLAN: .    Assessment and Plan    Diastolic Heart Failure Chronic condition with prior echocardiogram in June 2023 showing EF of 60%, normal artery function, and mild mitral valve regurgitation. Reports decreased energy. Current medications: torsemide 20mg , losartan 100 mg daily, Toprol 50 mg daily. Mild lower extremity edema (1-2+). Emphasized importance of diuretics to prevent fluid overload despite mild creatinine  elevation. Advised low-sodium diet and regular exercise. - Continue torsemide 20mg  QD, losartan 100 mg daily, Toprol 50 mg daily - Encourage low-sodium diet - Regular exercise - Follow-up in 6 months with Alden Server and in 1 year with cardiologist  Chronic Kidney Disease (CKD) Stage 3A Creatinine levels range from 1.34 to 1.5. Advised continuation of diuretics despite mild creatinine elevation to prevent fluid overload. Acceptable creatinine levels:  1.4-1.8; re-evaluation needed if >2.5. - Continue diuretic therapy - Monitor kidney function regularly - Avoid excessive fluid restriction  Chronic Obstructive Pulmonary Disease (COPD) Managed by pulmonary. 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 Lungs clear, no significant wheezing. - Continue management with Dr. Delton Coombes - Avoid respiratory irritants  General Health Maintenance Maintaining low-sodium diet and regular exercise (gym 3-4 times a week). Weight stable at 179 lbs. Encouraged to continue healthy habits. - Continue low-sodium diet - Continue regular exercise (gym 3-4 times a week)  Follow-up - Follow-up in 6 months with Alden Server - Follow-up in 1 year with cardiologist.              Signed, Donato Schultz, MD

## 2023-07-25 DIAGNOSIS — E1122 Type 2 diabetes mellitus with diabetic chronic kidney disease: Secondary | ICD-10-CM | POA: Diagnosis not present

## 2023-07-25 DIAGNOSIS — R0989 Other specified symptoms and signs involving the circulatory and respiratory systems: Secondary | ICD-10-CM | POA: Diagnosis not present

## 2023-07-25 DIAGNOSIS — I130 Hypertensive heart and chronic kidney disease with heart failure and stage 1 through stage 4 chronic kidney disease, or unspecified chronic kidney disease: Secondary | ICD-10-CM | POA: Diagnosis not present

## 2023-07-25 DIAGNOSIS — N1831 Chronic kidney disease, stage 3a: Secondary | ICD-10-CM | POA: Diagnosis not present

## 2023-07-25 DIAGNOSIS — E785 Hyperlipidemia, unspecified: Secondary | ICD-10-CM | POA: Diagnosis not present

## 2023-07-25 DIAGNOSIS — J449 Chronic obstructive pulmonary disease, unspecified: Secondary | ICD-10-CM | POA: Diagnosis not present

## 2023-07-25 DIAGNOSIS — Z87891 Personal history of nicotine dependence: Secondary | ICD-10-CM | POA: Diagnosis not present

## 2023-07-25 DIAGNOSIS — I509 Heart failure, unspecified: Secondary | ICD-10-CM | POA: Diagnosis not present

## 2023-08-09 DIAGNOSIS — J069 Acute upper respiratory infection, unspecified: Secondary | ICD-10-CM | POA: Diagnosis not present

## 2023-08-09 DIAGNOSIS — J209 Acute bronchitis, unspecified: Secondary | ICD-10-CM | POA: Diagnosis not present

## 2023-08-11 ENCOUNTER — Other Ambulatory Visit: Payer: Self-pay | Admitting: Primary Care

## 2023-08-15 DIAGNOSIS — F411 Generalized anxiety disorder: Secondary | ICD-10-CM | POA: Diagnosis not present

## 2023-08-15 DIAGNOSIS — Z79899 Other long term (current) drug therapy: Secondary | ICD-10-CM | POA: Diagnosis not present

## 2023-08-15 DIAGNOSIS — I10 Essential (primary) hypertension: Secondary | ICD-10-CM | POA: Diagnosis not present

## 2023-08-15 DIAGNOSIS — E7801 Familial hypercholesterolemia: Secondary | ICD-10-CM | POA: Diagnosis not present

## 2023-08-15 DIAGNOSIS — N401 Enlarged prostate with lower urinary tract symptoms: Secondary | ICD-10-CM | POA: Diagnosis not present

## 2023-08-15 DIAGNOSIS — Z125 Encounter for screening for malignant neoplasm of prostate: Secondary | ICD-10-CM | POA: Diagnosis not present

## 2023-08-23 DIAGNOSIS — J441 Chronic obstructive pulmonary disease with (acute) exacerbation: Secondary | ICD-10-CM | POA: Diagnosis not present

## 2023-08-23 DIAGNOSIS — N1831 Chronic kidney disease, stage 3a: Secondary | ICD-10-CM | POA: Diagnosis not present

## 2023-08-23 DIAGNOSIS — J0100 Acute maxillary sinusitis, unspecified: Secondary | ICD-10-CM | POA: Diagnosis not present

## 2023-08-26 ENCOUNTER — Ambulatory Visit: Payer: Medicare Other | Admitting: Nurse Practitioner

## 2023-08-27 ENCOUNTER — Ambulatory Visit (INDEPENDENT_AMBULATORY_CARE_PROVIDER_SITE_OTHER): Payer: Medicare Other

## 2023-08-27 ENCOUNTER — Encounter: Payer: Self-pay | Admitting: Primary Care

## 2023-08-27 ENCOUNTER — Ambulatory Visit: Payer: Medicare Other | Admitting: Primary Care

## 2023-08-27 VITALS — BP 130/80 | HR 74 | Temp 98.0°F | Ht 68.0 in | Wt 186.0 lb

## 2023-08-27 DIAGNOSIS — J441 Chronic obstructive pulmonary disease with (acute) exacerbation: Secondary | ICD-10-CM

## 2023-08-27 DIAGNOSIS — J449 Chronic obstructive pulmonary disease, unspecified: Secondary | ICD-10-CM

## 2023-08-27 MED ORDER — FLUTICASONE PROPIONATE 50 MCG/ACT NA SUSP: 2.0000 | Freq: Every day | NASAL | 5 refills | Status: DC

## 2023-08-27 MED ORDER — METHYLPREDNISOLONE ACETATE 80 MG/ML IJ SUSP
80.0000 mg | Freq: Once | INTRAMUSCULAR | Status: AC
  Administered 2023-08-27: 80 mg via INTRAMUSCULAR

## 2023-08-27 MED ORDER — PREDNISONE 10 MG PO TABS: ORAL_TABLET | ORAL | 0 refills | Status: DC

## 2023-08-27 NOTE — Patient Instructions (Signed)
-  COPD EXACERBATION: COPD exacerbation means a worsening of your chronic obstructive pulmonary disease symptoms. We will give you a Depo-Medrol injection today and start another course of prednisone tomorrow. Please monitor your blood sugars closely. We will also order a chest x-ray to rule out pneumonia. Continue using Symbicort, Spiriva, and Albuterol as needed, along with Mucinex and the acapella device.  -SINUS CONGESTION: Sinus congestion refers to the blockage and mucus buildup in your sinuses. We will refill your Flonase prescription and recommend using over-the-counter saline rinses 1-2 times daily to help clear your sinuses.  -DIABETES: Diabetes is a condition that affects your blood sugar levels. Continue to monitor your blood sugars closely while on prednisone and adjust your insulin as needed. If your blood sugars are consistently over 350, cut the prednisone dose in half and notify our office.  -GENERAL HEALTH MAINTENANCE: We will defer your pneumonia vaccine until you return to your baseline health status. Continue with your current routine and lifestyle recommendations.  Orders: CXR   Office treatment: Depo-medrol 80mg  IM  Rx: Refill fluticasone nasal spray Prednisone taper    Follow-up Please schedule a regular OV with Dr. Delton Coombes in the next 3-6 months / contact our office if not better with above recommendations or with any issues

## 2023-08-27 NOTE — Progress Notes (Signed)
@Patient  ID: Faythe Ghee, male    DOB: 01-11-1946, 78 y.o.   MRN: [id number]  Chief Complaint  Patient presents with   Follow-up    Referring provider: Nonnie Done., MD  HPI: 78 year old male, former smoker. PMH significant for COPD GOLD C, CAD, GERD. Patient of Dr. Delton Coombes  08/27/2023 Discussed the use of AI scribe software for clinical note transcription with the patient, who gave verbal consent to proceed.  History of Present Illness   The patient, with a history of stage three COPD, presented with a respiratory issue that began approximately three weeks ago. Prior to this episode, the patient reported being well for the past year, with symptoms well controlled on a regimen of Spiriva, Symbicort, and an as-needed albuterol nebulizer.  The recent respiratory issue was initially managed by the patient's primary care provider with a course of prednisone, which the patient completed. The patient reported that the prednisone helped with chest symptoms, but he is now experiencing sinus issues. He described mucus production from the sinuses, but denied any chest expectoration. Despite the sinus symptoms, the patient reported that his shortness of breath has returned to baseline.  The patient also has a history of diabetes, which he manages with insulin. He noted that his blood sugars tend to spike 6-8 hours after taking prednisone, but he has been able to control this with insulin adjustments.  The patient has been using a flutter valve device to aid in clearing any mucus from his lungs and has found this to be helpful. He has also been taking Mucinex to thin the mucus and aid expectoration. The patient reported running out of fluticasone nasal spray approximately six months ago and requested a refill. He has also been taking an antihistamine, loratadine.      No Known Allergies  Immunization History  Administered Date(s) Administered   Fluad Quad(high Dose 65+) 04/27/2019,  04/26/2020, 05/15/2021, 05/15/2022   H1N1 07/09/2008   Influenza Split 05/06/2012, 06/21/2015   Influenza Whole 05/23/2010, 05/07/2011   Influenza, High Dose Seasonal PF 05/23/2016, 04/14/2018   Influenza,inj,Quad PF,6+ Mos 05/01/2013, 05/07/2014   Influenza-Unspecified 04/01/2017   PFIZER(Purple Top)SARS-COV-2 Vaccination 09/27/2019, 10/21/2019, 07/04/2020   Pneumococcal Conjugate-13 03/09/2014   Pneumococcal Polysaccharide-23 05/23/2010, 06/23/2018   Zoster, Live 12/05/2014    Past Medical History:  Diagnosis Date   Anemia    Anxiety    Arthritis    CKD (chronic kidney disease), stage II    COPD (chronic obstructive pulmonary disease) (HCC)    Coronary artery disease    a. s/p CABG 1999.   Depression    Diabetes mellitus    Diverticulosis    GERD (gastroesophageal reflux disease)    Hiatal hernia    History of echocardiogram    Echocardiogram 10/21: EF 60-65, no RWMA, Gr 1 DD, normal RVSF, RVSP 15.2    History of nuclear stress test    Myoview 10/21: EF 62, normal perfusion; low risk    Hypercholesteremia    Hypertension    Sleep apnea    had sleep study and negative per pt   Thyroid disease    Tobacco abuse    Tubular adenoma of colon     Tobacco History: Social History   Tobacco Use  Smoking Status Former   Current packs/day: 0.00   Average packs/day: 2.0 packs/day for 35.0 years (70.0 ttl pk-yrs)   Types: Cigarettes   Start date: 09/27/1962   Quit date: 09/27/1997   Years since quitting: 25.9  Smokeless Tobacco Never   Counseling given: Not Answered   Outpatient Medications Prior to Visit  Medication Sig Dispense Refill   albuterol (PROVENTIL) (2.5 MG/3ML) 0.083% nebulizer solution Take 3 mLs (2.5 mg total) by nebulization every 6 (six) hours as needed for wheezing or shortness of breath. 360 mL 5   albuterol (VENTOLIN HFA) 108 (90 Base) MCG/ACT inhaler INHALE 2 PUFFS BY MOUTH EVERY 4 HOURS AS NEEDED FOR WHEEZING OR SHORTNESS OF BREATH 9 g 2   ALPRAZolam  (XANAX) 0.5 MG tablet Take 0.5 mg by mouth at bedtime.      antiseptic oral rinse (BIOTENE) LIQD 15 mLs by Mouth Rinse route daily.     aspirin 81 MG tablet Take 81 mg by mouth at bedtime.      Coenzyme Q10 (CO Q 10) 100 MG CAPS Take 100 mg by mouth at bedtime.      colesevelam (WELCHOL) 625 MG tablet TAKE 3 TABLETS BY MOUTH TWICE DAILY WITH MEALS 540 tablet 3   dextromethorphan-guaiFENesin (MUCINEX DM) 30-600 MG 12hr tablet Take 1 tablet by mouth 2 (two) times daily. 60 tablet 0   doxepin (SINEQUAN) 50 MG capsule Take 100 mg by mouth at bedtime.      esomeprazole (NEXIUM) 20 MG capsule Take 20 mg by mouth 2 (two) times daily.     HUMALOG 100 UNIT/ML injection 80 Units.   6   hydrochlorothiazide (HYDRODIURIL) 25 MG tablet Take 1 tablet by mouth once daily 90 tablet 3   levothyroxine (SYNTHROID) 200 MCG tablet Take 200 mcg by mouth every morning.     loratadine (CLARITIN) 10 MG tablet Take 1 tablet (10 mg total) by mouth daily. 30 tablet 2   losartan (COZAAR) 100 MG tablet Take 1 tablet (100 mg total) by mouth daily. 90 tablet 3   metFORMIN (GLUCOPHAGE) 500 MG tablet Take 1 tablet (500 mg total) by mouth 2 (two) times daily with a meal.     metoprolol succinate (TOPROL-XL) 50 MG 24 hr tablet TAKE 1 TABLET BY MOUTH ONCE DAILY WITH OR IMMEDIATELY FOLOWING A MEAL 90 tablet 3   Multiple Vitamins-Minerals (CENTRUM SILVER 50+MEN) TABS Take 1 tablet by mouth daily.     nitroGLYCERIN (NITROSTAT) 0.4 MG SL tablet Place 1 tablet (0.4 mg total) under the tongue every 5 (five) minutes as needed for chest pain. 25 tablet 3   Omega-3 Fatty Acids (FISH OIL) 600 MG CAPS Take 1,400 mg by mouth at bedtime.      potassium chloride (KLOR-CON) 10 MEQ tablet Take 1 tablet (10 mEq total) by mouth daily as needed (when taking torsemide). 90 tablet 2   pravastatin (PRAVACHOL) 40 MG tablet Take 1 tablet by mouth once daily 90 tablet 3   SYMBICORT 160-4.5 MCG/ACT inhaler Inhale 2 puffs by mouth twice daily 11 g 3    tamsulosin (FLOMAX) 0.4 MG CAPS capsule SMARTSIG:1 Capsule(s) By Mouth Every Evening     Tiotropium Bromide Monohydrate (SPIRIVA RESPIMAT) 2.5 MCG/ACT AERS INHALE 2 SPRAY(S) BY MOUTH ONCE DAILY 4 g 2   torsemide (DEMADEX) 20 MG tablet TAKE 2 TABLETS BY MOUTH ONCE DAILY FOR 3 DAYS AND THEN DECREASE TO 1 TABLET ONCE DAILY 90 tablet 2   fluticasone (FLONASE) 50 MCG/ACT nasal spray Use 1 spray(s) in each nostril once daily 16 g 3   Facility-Administered Medications Prior to Visit  Medication Dose Route Frequency Provider Last Rate Last Admin   0.9 %  sodium chloride infusion  500 mL Intravenous Continuous Rachael Fee, MD  Review of Systems  Review of Systems  Constitutional: Negative.   HENT:  Positive for congestion.   Respiratory:  Positive for cough. Negative for chest tightness, shortness of breath and wheezing.    Physical Exam  BP 130/80 (BP Location: Left Arm, Patient Position: Sitting, Cuff Size: Normal)   Pulse 74   Temp 98 F (36.7 C) (Oral)   Ht 5\' 8"  (1.727 m)   Wt 186 lb (84.4 kg)   SpO2 97%   BMI 28.28 kg/m  Physical Exam Constitutional:      Appearance: Normal appearance.  HENT:     Head: Normocephalic and atraumatic.     Mouth/Throat:     Mouth: Mucous membranes are moist.     Pharynx: Oropharynx is clear.  Cardiovascular:     Rate and Rhythm: Normal rate and regular rhythm.  Pulmonary:     Effort: Pulmonary effort is normal.     Breath sounds: Wheezing present.  Musculoskeletal:        General: Normal range of motion.     Cervical back: Normal range of motion and neck supple.  Skin:    General: Skin is warm and dry.  Neurological:     General: No focal deficit present.     Mental Status: He is alert and oriented to person, place, and time. Mental status is at baseline.  Psychiatric:        Mood and Affect: Mood normal.        Behavior: Behavior normal.        Thought Content: Thought content normal.        Judgment: Judgment normal.       Lab Results:  CBC    Component Value Date/Time   WBC 7.2 03/14/2021 1226   WBC 8.6 07/31/2018 1438   RBC 4.26 03/14/2021 1226   RBC 4.22 07/31/2018 1438   HGB 12.9 (L) 03/14/2021 1226   HCT 39.2 03/14/2021 1226   PLT 227 03/14/2021 1226   MCV 92 03/14/2021 1226   MCH 30.3 03/14/2021 1226   MCH 21.3 (L) 06/05/2016 1055   MCHC 32.9 03/14/2021 1226   MCHC 33.1 07/31/2018 1438   RDW 14.1 03/14/2021 1226   LYMPHSABS 2.0 07/31/2018 1438   MONOABS 1.1 (H) 07/31/2018 1438   EOSABS 0.2 07/31/2018 1438   BASOSABS 0.1 07/31/2018 1438    BMET    Component Value Date/Time   NA 137 01/28/2023 1648   K 4.5 01/28/2023 1648   CL 99 01/28/2023 1648   CO2 21 01/28/2023 1648   GLUCOSE 215 (H) 01/28/2023 1648   GLUCOSE 177 (H) 01/23/2022 1158   BUN 15 01/28/2023 1648   CREATININE 1.24 01/28/2023 1648   CREATININE 1.26 (H) 06/05/2016 1055   CALCIUM 9.4 01/28/2023 1648   GFRNONAA 60 06/03/2020 1216   GFRAA 69 06/03/2020 1216    BNP No results found for: "BNP"  ProBNP    Component Value Date/Time   PROBNP 63.0 09/12/2021 1454    Imaging: No results found.   Assessment & Plan:   1. Stage 3 severe COPD by GOLD classification (HCC) (Primary) - DG Chest 2 View; Future  2. COPD with acute exacerbation (HCC) - DG Chest 2 View; Future     COPD exacerbation Recent respiratory illness with prolonged symptoms. Abx and prednisone course completed with improvement in chest symptoms but ongoing sinus symptoms. Wheezing noted on exam. Patient reports COPD symptoms were well cotrolled on current inhaler regimen up until recent illness.  -Administer Depo-Medrol 80mg   injection today. -Start prednisone course tomorrow, monitor blood sugars closely. -Order chest x-ray to rule out pneumonia. -Continue use of Symbicort 160, Spiriva Respimat 2.87mcg, and Albuterol as needed. -Continue use of Mucinex and acapella device.  Sinus congestion -Refill Flonase prescription. -Recommend  over-the-counter saline rinses 1-2 times daily.  Diabetes Patient on insulin, self-adjusts based on blood sugar readings. -Monitor blood sugars closely while on prednisone, adjust insulin as needed. -If blood sugars consistently >300, cut prednisone dose in half and notify office.  General Health Maintenance -Defer pneumonia vaccine until return to baseline health status.      Glenford Bayley, NP 08/27/2023

## 2023-08-28 NOTE — Progress Notes (Signed)
Please let patient know CXR showed no acute process, lungs hyperinflated consistent with emphysema. No pneumonia

## 2023-10-22 DIAGNOSIS — E7800 Pure hypercholesterolemia, unspecified: Secondary | ICD-10-CM | POA: Diagnosis not present

## 2023-10-22 DIAGNOSIS — E1165 Type 2 diabetes mellitus with hyperglycemia: Secondary | ICD-10-CM | POA: Diagnosis not present

## 2023-10-22 DIAGNOSIS — I10 Essential (primary) hypertension: Secondary | ICD-10-CM | POA: Diagnosis not present

## 2023-10-22 DIAGNOSIS — E049 Nontoxic goiter, unspecified: Secondary | ICD-10-CM | POA: Diagnosis not present

## 2023-10-22 DIAGNOSIS — E02 Subclinical iodine-deficiency hypothyroidism: Secondary | ICD-10-CM | POA: Diagnosis not present

## 2023-10-22 DIAGNOSIS — Z9641 Presence of insulin pump (external) (internal): Secondary | ICD-10-CM | POA: Diagnosis not present

## 2023-11-04 ENCOUNTER — Other Ambulatory Visit: Payer: Self-pay | Admitting: Cardiology

## 2023-11-05 ENCOUNTER — Other Ambulatory Visit: Payer: Self-pay | Admitting: Nurse Practitioner

## 2023-11-05 DIAGNOSIS — R0989 Other specified symptoms and signs involving the circulatory and respiratory systems: Secondary | ICD-10-CM | POA: Diagnosis not present

## 2023-11-05 DIAGNOSIS — E119 Type 2 diabetes mellitus without complications: Secondary | ICD-10-CM | POA: Diagnosis not present

## 2023-11-05 DIAGNOSIS — J449 Chronic obstructive pulmonary disease, unspecified: Secondary | ICD-10-CM

## 2023-11-28 DIAGNOSIS — D2239 Melanocytic nevi of other parts of face: Secondary | ICD-10-CM | POA: Diagnosis not present

## 2023-11-28 DIAGNOSIS — L0100 Impetigo, unspecified: Secondary | ICD-10-CM | POA: Diagnosis not present

## 2023-11-28 DIAGNOSIS — L570 Actinic keratosis: Secondary | ICD-10-CM | POA: Diagnosis not present

## 2023-11-28 DIAGNOSIS — L820 Inflamed seborrheic keratosis: Secondary | ICD-10-CM | POA: Diagnosis not present

## 2023-11-28 DIAGNOSIS — D485 Neoplasm of uncertain behavior of skin: Secondary | ICD-10-CM | POA: Diagnosis not present

## 2023-11-28 DIAGNOSIS — L578 Other skin changes due to chronic exposure to nonionizing radiation: Secondary | ICD-10-CM | POA: Diagnosis not present

## 2023-11-28 DIAGNOSIS — D225 Melanocytic nevi of trunk: Secondary | ICD-10-CM | POA: Diagnosis not present

## 2023-12-04 DIAGNOSIS — E049 Nontoxic goiter, unspecified: Secondary | ICD-10-CM | POA: Diagnosis not present

## 2023-12-04 DIAGNOSIS — Z9641 Presence of insulin pump (external) (internal): Secondary | ICD-10-CM | POA: Diagnosis not present

## 2023-12-04 DIAGNOSIS — E7800 Pure hypercholesterolemia, unspecified: Secondary | ICD-10-CM | POA: Diagnosis not present

## 2023-12-04 DIAGNOSIS — R69 Illness, unspecified: Secondary | ICD-10-CM | POA: Diagnosis not present

## 2023-12-04 DIAGNOSIS — E1165 Type 2 diabetes mellitus with hyperglycemia: Secondary | ICD-10-CM | POA: Diagnosis not present

## 2023-12-04 DIAGNOSIS — E02 Subclinical iodine-deficiency hypothyroidism: Secondary | ICD-10-CM | POA: Diagnosis not present

## 2023-12-04 DIAGNOSIS — I10 Essential (primary) hypertension: Secondary | ICD-10-CM | POA: Diagnosis not present

## 2023-12-04 DIAGNOSIS — R739 Hyperglycemia, unspecified: Secondary | ICD-10-CM | POA: Diagnosis not present

## 2023-12-04 DIAGNOSIS — E162 Hypoglycemia, unspecified: Secondary | ICD-10-CM | POA: Diagnosis not present

## 2023-12-05 ENCOUNTER — Other Ambulatory Visit: Payer: Self-pay | Admitting: Cardiology

## 2023-12-25 ENCOUNTER — Encounter: Payer: Self-pay | Admitting: Emergency Medicine

## 2023-12-25 ENCOUNTER — Ambulatory Visit (INDEPENDENT_AMBULATORY_CARE_PROVIDER_SITE_OTHER): Payer: Medicare Other | Admitting: Emergency Medicine

## 2023-12-25 VITALS — BP 120/72 | HR 79 | Ht 69.0 in | Wt 179.2 lb

## 2023-12-25 DIAGNOSIS — J439 Emphysema, unspecified: Secondary | ICD-10-CM | POA: Diagnosis not present

## 2023-12-25 DIAGNOSIS — J449 Chronic obstructive pulmonary disease, unspecified: Secondary | ICD-10-CM

## 2023-12-25 DIAGNOSIS — I25709 Atherosclerosis of coronary artery bypass graft(s), unspecified, with unspecified angina pectoris: Secondary | ICD-10-CM | POA: Diagnosis not present

## 2023-12-25 MED ORDER — ALBUTEROL SULFATE HFA 108 (90 BASE) MCG/ACT IN AERS: 2.0000 | INHALATION_SPRAY | RESPIRATORY_TRACT | 2 refills | Status: AC | PRN

## 2023-12-25 MED ORDER — SYMBICORT 160-4.5 MCG/ACT IN AERO
2.0000 | INHALATION_SPRAY | Freq: Two times a day (BID) | RESPIRATORY_TRACT | 0 refills | Status: DC
Start: 2023-12-25 — End: 2024-05-29

## 2023-12-25 MED ORDER — SPIRIVA RESPIMAT 2.5 MCG/ACT IN AERS: INHALATION_SPRAY | RESPIRATORY_TRACT | 2 refills | Status: AC

## 2023-12-25 MED ORDER — FLUTICASONE PROPIONATE 50 MCG/ACT NA SUSP: 2.0000 | Freq: Every day | NASAL | 5 refills | Status: AC

## 2023-12-25 NOTE — Assessment & Plan Note (Signed)
 Please continue your Symbicort  and your Spiriva  as you have been taking them. Keep your albuterol  available use either 2 puffs or 1 nebulizer treatment up to every 4 hours when needed for shortness of breath, chest tightness, wheezing. We will try starting a new medication called Ohtuvayre .  You will take 1 nebulizer treatment twice a day.  Keep track of how the medication helps you so we can discuss it next time Follow in our office in about 4 months so we can review your breathing on the new medication

## 2023-12-25 NOTE — Patient Instructions (Signed)
 Please continue your Symbicort  and your Spiriva  as you have been taking them. Keep your albuterol  available use either 2 puffs or 1 nebulizer treatment up to every 4 hours when needed for shortness of breath, chest tightness, wheezing. We will try starting a new medication called Ohtuvayre .  You will take 1 nebulizer treatment twice a day.  Keep track of how the medication helps you so we can discuss it next time We will perform a walking oximetry today Please also follow-up in cardiology office to discuss your short windedness and chest symptoms Follow in our office in about 4 months so we can review your breathing on the new medication

## 2023-12-25 NOTE — Progress Notes (Signed)
 HPI:   ROV 05/15/22 --Oscar Robinson is 78 with a history of very severe COPD and associated chronic hypoxemic respiratory failure.  I last saw him in May and has been seeing twice here since.  I have been managing him on Symbicort  and Spiriva , intermittently has required diuretics.  In May I started him on prednisone  20 mg daily see if he would get benefit.  He did not notice that it helped him.  He was changed to nebulized regimen, Yupelri , Brovana , budesonide  but ultimately he went back to the original regimen (mainly for time reasons).  The prednisone  was tapered to off.  An ONO was performed that qualified him for nocturnal oxygen  but he wanted to defer.  He started pulmonary rehab. He had COVID-19 in August - was treated with prednisone , also was given an antiviral at [facility].  He is active - works on farm. Minimal cough or sputum. Occasionally hears some wheeze.   ROV 12/25/23 --78 year old man with a history of very severe COPD with associated chronic hypoxemic respiratory failure.  He has been managed on Symbicort  and Spiriva , Mucinex , fluticasone  nasal spray.  He was treated for an acute exacerbation in January with Depo-Medrol  and then prednisone . He did pulm rehab over a year ago and it did help his breathing and functional capacity. He went to the gym for a while after, but then fell off in December after the prolonged flare. He stays busy but his exertion is limited. He is supervising house remodeling. He uses albuterol  about 2-3x a day.    EXAM:  Vitals:   12/25/23 1525  BP: 120/72  Pulse: 79  SpO2: 96%  Weight: 179 lb 3.2 oz (81.3 kg)  Height: 5\' 9"  (1.753 m)    Gen: Pleasant, well-nourished, in no distress,  normal affect  ENT: No lesions,  mouth clear,  oropharynx clear, no postnasal drip  Neck: No JVD, no stridor  Lungs: No use of accessory muscles, bilateral expiratory wheezes  Cardiovascular: RRR, heart sounds normal, no murmur or gallops  Musculoskeletal: No deformities, no  cyanosis or clubbing  Neuro: alert, non focal  Skin: Warm, no lesions or rashes   GOLD COPD C Please continue your Symbicort  and your Spiriva  as you have been taking them. Keep your albuterol  available use either 2 puffs or 1 nebulizer treatment up to every 4 hours when needed for shortness of breath, chest tightness, wheezing. We will try starting a new medication called Ohtuvayre .  You will take 1 nebulizer treatment twice a day.  Keep track of how the medication helps you so we can discuss it next time Follow in our office in about 4 months so we can review your breathing on the new medication  Coronary artery disease involving coronary bypass graft of native heart with angina pectoris (HCC) He describes tachypnea and shortness of breath with exertion are likely largely related to his COPD but he did have CABG in 1999, needs to continue his cardiac evaluation and surveillance to ensure no evidence of ischemia.  Of note he tells me that the symptoms are reminiscent of his CAD from back in 1999 prior to his CABG.  He is well-established at cardiology and asked him to follow-up.      Racheal Buddle, MD, PhD 12/25/2023, 4:02 PM [facility] [phone] or if no answer [phone]

## 2023-12-25 NOTE — Assessment & Plan Note (Signed)
 He describes tachypnea and shortness of breath with exertion are likely largely related to his COPD but he did have CABG in 1999, needs to continue his cardiac evaluation and surveillance to ensure no evidence of ischemia.  Of note he tells me that the symptoms are reminiscent of his CAD from back in 1999 prior to his CABG.  He is well-established at cardiology and asked him to follow-up.

## 2023-12-25 NOTE — Addendum Note (Signed)
 Addended byGregory Leash, Telesforo Brosnahan A on: 12/25/2023 04:20 PM   Modules accepted: Orders

## 2023-12-26 ENCOUNTER — Telehealth: Payer: Self-pay

## 2023-12-26 ENCOUNTER — Telehealth: Payer: Self-pay | Admitting: Nurse Practitioner

## 2023-12-26 NOTE — Telephone Encounter (Signed)
 Patient c/o Palpitations:  STAT if patient reporting lightheadedness, shortness of breath, or chest pain  How long have you had palpitations/irregular HR/ Afib? Are you having the symptoms now? He says he have been having fluttering. He says he is not having it at this time  Are you currently experiencing lightheadedness, SOB or CP? Short of breath- not at this time, also some dizziness, but not at this time  Do you have a history of afib (atrial fibrillation) or irregular heart rhythm?   Have you checked your BP or HR? (document readings if available): good  Are you experiencing any other symptoms?  No- patient said he would like to be seen asap please

## 2023-12-26 NOTE — Telephone Encounter (Signed)
 Called patient back about message. Patient stated he had symptoms similar to symptoms he had prior to his CABG, SOB and dizziness. Patient stated he only had these sensations once yesterday and it has not come back. Made patient the first available office visit with Dr. Renna Cary next week. Encouraged patient to use his nitroglycerin  as prescribed. Encouraged patient to go to the ED or call 911 if he has these symptoms again or if they are worse. Will send message to Dr. Renna Cary and his nurse for further advisement.

## 2023-12-26 NOTE — Telephone Encounter (Signed)
 Received Ohtuvayre new start paperwork. Completed form and faxed with clinicals and insurance card copy to [facility]   Phone#: [phone] Fax#: [phone]   Chesley Mires, PharmD, MPH, BCPS, CPP Clinical Pharmacist (Rheumatology and Pulmonology)

## 2023-12-27 NOTE — Telephone Encounter (Signed)
 Dr Renna Cary aware.  Appt scheduled for 01/02/24

## 2024-01-02 ENCOUNTER — Ambulatory Visit: Attending: Cardiology | Admitting: Cardiology

## 2024-01-02 ENCOUNTER — Encounter: Payer: Self-pay | Admitting: Cardiology

## 2024-01-02 VITALS — BP 124/73 | HR 69 | Ht 69.0 in | Wt 175.2 lb

## 2024-01-02 DIAGNOSIS — N1831 Chronic kidney disease, stage 3a: Secondary | ICD-10-CM

## 2024-01-02 DIAGNOSIS — I5032 Chronic diastolic (congestive) heart failure: Secondary | ICD-10-CM

## 2024-01-02 DIAGNOSIS — R079 Chest pain, unspecified: Secondary | ICD-10-CM | POA: Diagnosis not present

## 2024-01-02 DIAGNOSIS — I25709 Atherosclerosis of coronary artery bypass graft(s), unspecified, with unspecified angina pectoris: Secondary | ICD-10-CM | POA: Diagnosis not present

## 2024-01-02 DIAGNOSIS — R0602 Shortness of breath: Secondary | ICD-10-CM | POA: Diagnosis not present

## 2024-01-02 NOTE — Progress Notes (Signed)
 Cardiology Office Note:  .   Date:  01/02/2024  ID:  Oscar Robinson, DOB 12-09-1945, MRN [id number] PCP: Lucius Sabins., MD  [facility] Providers Cardiologist:  Dorothye Gathers, MD    History of Present Illness: .   Oscar Robinson is a 78 y.o. male Discussed the use of AI scribe software for clinical note transcription with the patient, who gave verbal consent to proceed.  History of Present Illness Oscar Robinson "Oscar Robinson" is a 78 year old male with coronary artery disease and a history of CABG who presents with shortness of breath and dizziness. He was referred by Dr. Baldwin Levee for evaluation of his breathing/ cardiac issues.  He has been experiencing shortness of breath and dizziness, reminiscent of symptoms prior to his coronary artery bypass grafting (CABG) in 1999. These symptoms occurred once yesterday but have not recurred since. During the episode, he noted faster breathing, similar to the time before his bypass surgery.  His past cardiac evaluations include an echocardiogram in 2023 to assess heart function, a nuclear stress test in 2021, and a cardiac catheterization in July 2019.  He has a history of diastolic heart failure, COPD, and chronic kidney disease stage 3A. He reports decreased energy and feeling sluggish. His current medications include aspirin  81 mg, Welchol  for hyperlipidemia, hydrochlorothiazide  25 mg daily, losartan  100 mg daily, metoprolol  50 mg daily, pravastatin  40 mg daily, and torsemide  20 mg two tablets once daily for three days if fluid overload occurs, then decreasing to one tablet daily. For COPD, he has taken prednisone  intermittently.  He quit smoking 25 years ago and exercised heavily for 20 years. Recently, he has not been going to the gym due to his wife's hip replacement and subsequent complications, which required his attention and led to significant sleep deprivation. He has since hired help to manage his responsibilities at home, including  maintaining his land and feeding wild turkeys. Now planting corn.       ROS: No syncope  Studies Reviewed: Aaron Aas    EKG Interpretation Date/Time:  Thursday Jan 02 2024 08:24:50 EDT Ventricular Rate:  69 PR Interval:  164 QRS Duration:  96 QT Interval:  406 QTC Calculation: 435 R Axis:   56  Text Interpretation: Normal sinus rhythm Nonspecific T wave abnormality When compared with ECG of 22-Jul-2023 15:53, No significant change was found Confirmed by Dorothye Gathers ([id number]) on 01/02/2024 8:26:31 AM    Results LABS LDL: 39 mg/dL X5M: 8.4% Hb: 13.2 g/dL Cr: 1.3 mg/dL ALT: 15 U/L  DIAGNOSTIC Echocardiogram: Normal pump function, EF 60%, normal RV, mild mitral valve regurgitation, unchanged from prior (2023) Nuclear stress test: Normal (2021) Cardiac catheterization: LIMA to diagonal patent, first diagonal lesion 75% stenosis, proximal LAD 25% stenosis, normal LVEDP (02/26/2018)  Risk Assessment/Calculations:            Physical Exam:   VS:  BP 124/73   Pulse 69   Ht 5\' 9"  (1.753 m)   Wt 175 lb 3.2 oz (79.5 kg)   SpO2 94%   BMI 25.87 kg/m    Wt Readings from Last 3 Encounters:  01/02/24 175 lb 3.2 oz (79.5 kg)  12/25/23 179 lb 3.2 oz (81.3 kg)  08/27/23 186 lb (84.4 kg)    GEN: Well nourished, well developed in no acute distress NECK: No JVD; No carotid bruits CARDIAC: RRR, no murmurs, no rubs, no gallops, CABG scar RESPIRATORY:  Poor air movement bilat ABDOMEN: Soft, non-tender, non-distended EXTREMITIES:  No edema; No  deformity   ASSESSMENT AND PLAN: .    Assessment and Plan Assessment & Plan Diastolic heart failure Intermittent dyspnea and dizziness, similar to pre-CABG symptoms. Recent tachypnea. Echocardiogram in 2023 showed normal systolic function with EF 60% and mild mitral regurgitation. Cardiac catheterization in 2019 revealed patent LIMA to diagonal and 75% stenosis in the first diagonal lesion. Recurrence of symptoms warrants further evaluation. - Order  Lexiscan  nuclear stress test to evaluate cardiac function and rule out any unusual flow or blockage.  Coronary artery disease with CABG CABG in 1999 with patent LIMA to diagonal. Recurrence of dyspnea and dizziness similar to pre-CABG symptoms. Cardiac catheterization in 2019 showed no change from prior cath. Normal nuclear stress test in 2021. Further evaluation is necessary due to symptom recurrence. - Order Lexiscan  nuclear stress test to assess for any changes in coronary circulation.  Chronic obstructive pulmonary disease (COPD) COPD with occasional prednisone  use. Recent tachypnea possibly related to exertion and COPD.  Chronic kidney disease, stage 3A Slightly reduced renal function with creatinine at 1.3. No significant deterioration.  Hyperlipidemia LDL well-controlled at 39 with pravastatin  and Welchol .  Elevated A1c A1c elevated at 7.8, likely influenced by recent prednisone  use.  Goals of Care He aims to reach age 66 and is optimistic about health management. Lifestyle changes include smoking cessation and increased exercise to improve long-term health outcomes.       Informed Consent   Shared Decision Making/Informed Consent The risks [chest pain, shortness of breath, cardiac arrhythmias, dizziness, blood pressure fluctuations, myocardial infarction, stroke/transient ischemic attack, nausea, vomiting, allergic reaction, radiation exposure, metallic taste sensation and life-threatening complications (estimated to be 1 in 10,000)], benefits (risk stratification, diagnosing coronary artery disease, treatment guidance) and alternatives of a nuclear stress test were discussed in detail with Oscar Robinson and he agrees to proceed.     Dispo: 1 yr  Signed, Dorothye Gathers, MD

## 2024-01-02 NOTE — Patient Instructions (Signed)
 Medication Instructions:  Your physician recommends that you continue on your current medications as directed. Please refer to the Current Medication list given to you today.  *If you need a refill on your cardiac medications before your next appointment, please call your pharmacy*  Lab Work: If you have labs (blood work) drawn today and your tests are completely normal, you will receive your results only by: MyChart Message (if you have MyChart) OR A paper copy in the mail If you have any lab test that is abnormal or we need to change your treatment, we will call you to review the results.  Testing/Procedures: Your physician has requested that you have a lexiscan  myoview . For further information please visit [email or web address] Please follow instruction sheet, as given.  Follow-Up: At [facility], you and your health needs are our priority.  As part of our continuing mission to provide you with exceptional heart care, our providers are all part of one team.  This team includes your primary Cardiologist (physician) and Advanced Practice Providers or APPs (Physician Assistants and Nurse Practitioners) who all work together to provide you with the care you need, when you need it.  Your next appointment:   1 year(s)  Provider:   Dorothye Gathers, MD    We recommend signing up for the patient portal called "MyChart".  Sign up information is provided on this After Visit Summary.  MyChart is used to connect with patients for Virtual Visits (Telemedicine).  Patients are able to view lab/test results, encounter notes, upcoming appointments, etc.  Non-urgent messages can be sent to your provider as well.   To learn more about what you can do with MyChart, go to [email or web address].

## 2024-01-06 ENCOUNTER — Telehealth (HOSPITAL_COMMUNITY): Payer: Self-pay

## 2024-01-06 NOTE — Telephone Encounter (Signed)
 Detailed instructions left on the patient's answering machine. S.Jassiel Flye CCT

## 2024-01-08 ENCOUNTER — Other Ambulatory Visit: Payer: Self-pay | Admitting: Cardiology

## 2024-01-09 ENCOUNTER — Other Ambulatory Visit: Payer: Self-pay | Admitting: Cardiology

## 2024-01-09 DIAGNOSIS — N1831 Chronic kidney disease, stage 3a: Secondary | ICD-10-CM

## 2024-01-09 DIAGNOSIS — R0602 Shortness of breath: Secondary | ICD-10-CM

## 2024-01-09 DIAGNOSIS — I25709 Atherosclerosis of coronary artery bypass graft(s), unspecified, with unspecified angina pectoris: Secondary | ICD-10-CM

## 2024-01-09 DIAGNOSIS — R079 Chest pain, unspecified: Secondary | ICD-10-CM

## 2024-01-09 DIAGNOSIS — I5032 Chronic diastolic (congestive) heart failure: Secondary | ICD-10-CM

## 2024-01-13 DIAGNOSIS — Z0000 Encounter for general adult medical examination without abnormal findings: Secondary | ICD-10-CM | POA: Diagnosis not present

## 2024-01-13 DIAGNOSIS — I5032 Chronic diastolic (congestive) heart failure: Secondary | ICD-10-CM | POA: Diagnosis not present

## 2024-01-13 DIAGNOSIS — R42 Dizziness and giddiness: Secondary | ICD-10-CM | POA: Diagnosis not present

## 2024-01-13 DIAGNOSIS — E1159 Type 2 diabetes mellitus with other circulatory complications: Secondary | ICD-10-CM | POA: Diagnosis not present

## 2024-01-14 ENCOUNTER — Telehealth: Payer: Self-pay

## 2024-01-14 ENCOUNTER — Telehealth: Payer: Self-pay | Admitting: Cardiology

## 2024-01-14 ENCOUNTER — Ambulatory Visit (HOSPITAL_COMMUNITY)
Admission: RE | Admit: 2024-01-14 | Discharge: 2024-01-14 | Disposition: A | Discharge status: Home / Self Care | Source: Ambulatory Visit | Attending: Cardiovascular Disease | Admitting: Cardiovascular Disease

## 2024-01-14 DIAGNOSIS — N1831 Chronic kidney disease, stage 3a: Secondary | ICD-10-CM | POA: Diagnosis not present

## 2024-01-14 DIAGNOSIS — I5032 Chronic diastolic (congestive) heart failure: Secondary | ICD-10-CM | POA: Insufficient documentation

## 2024-01-14 DIAGNOSIS — R079 Chest pain, unspecified: Secondary | ICD-10-CM | POA: Diagnosis not present

## 2024-01-14 DIAGNOSIS — R0602 Shortness of breath: Secondary | ICD-10-CM | POA: Insufficient documentation

## 2024-01-14 DIAGNOSIS — I25709 Atherosclerosis of coronary artery bypass graft(s), unspecified, with unspecified angina pectoris: Secondary | ICD-10-CM | POA: Diagnosis not present

## 2024-01-14 LAB — MYOCARDIAL PERFUSION IMAGING
LV dias vol: 73 mL (ref 62–150)
LV sys vol: 29 mL
Nuc Stress EF: 60 %
Peak HR: 73 {beats}/min
Rest HR: 65 {beats}/min
Rest Nuclear Isotope Dose: 10.3 mCi
SDS: 0
SRS: 0
SSS: 0
ST Depression (mm): 0 mm
Stress Nuclear Isotope Dose: 32.5 mCi
TID: 1.25

## 2024-01-14 MED ORDER — REGADENOSON 0.4 MG/5ML IV SOLN
INTRAVENOUS | Status: AC
  Filled 2024-01-14: qty 5

## 2024-01-14 MED ORDER — REGADENOSON 0.4 MG/5ML IV SOLN
0.4000 mg | Freq: Once | INTRAVENOUS | Status: AC
Start: 2024-01-14 — End: 2024-01-14
  Administered 2024-01-14: 0.4 mg via INTRAVENOUS

## 2024-01-14 MED ORDER — TECHNETIUM TC 99M TETROFOSMIN IV KIT
10.3000 | PACK | Freq: Once | INTRAVENOUS | Status: AC | PRN
  Administered 2024-01-14: 10.3 via INTRAVENOUS

## 2024-01-14 MED ORDER — TECHNETIUM TC 99M TETROFOSMIN IV KIT
32.5000 | PACK | Freq: Once | INTRAVENOUS | Status: AC | PRN
  Administered 2024-01-14: 32.5 via INTRAVENOUS

## 2024-01-14 NOTE — Telephone Encounter (Signed)
 Please let him know that I agree with stopping the Ohtuvayre . Would have him continue all of our other meds. We can revisit any other COPD med options when we follow up. Thanks

## 2024-01-14 NOTE — Telephone Encounter (Signed)
 Pt c/o medication issue:  1. Name of Medication:   metFORMIN  (GLUCOPHAGE ) 500 MG tablet   2. How are you currently taking this medication (dosage and times per day)?   3. Are you having a reaction (difficulty breathing--STAT)?   4. What is your medication issue?   Patient stated he has a stress test this morning and wants to confirm if he should take this medication prior to his test.

## 2024-01-14 NOTE — Telephone Encounter (Signed)
**Note De-identified  [place] Obfuscation** Please advise 

## 2024-01-14 NOTE — Telephone Encounter (Signed)
 Copied from CRM [id number]. Topic: Clinical - Medical Advice >> Jan 13, 2024  3:53 PM Ilene Malling wrote: Reason for CRM: Patient [id number] wants to speak with Dr. Lacinda Pica nurse regarding new medication Ohtuvayre . Patient states had an reactions of sore throat, hard time swallowing, wrist swollen, arthritis worsen and did not feel it help with breathing. Patient stop taking med Friday night and noticed the problems patient was having cleared away. Please advise and call back at the afternoon. Patient is having a stress test done tomorrow morning.   Dr.Byrum can you please advise .  Thank you

## 2024-01-15 ENCOUNTER — Ambulatory Visit: Payer: Self-pay | Admitting: Cardiology

## 2024-01-17 ENCOUNTER — Other Ambulatory Visit: Payer: Self-pay | Admitting: Nurse Practitioner

## 2024-01-29 NOTE — Telephone Encounter (Signed)
 Pt calling for results. He states he was not able to understand what they said via mychart.

## 2024-01-29 NOTE — Telephone Encounter (Signed)
 I called and spoke to pt. Pt informed of Dr Lanny note and verbalized understanding. Pt was suppose to f/u with Dr Shelah in 4 months from previous appointment. Routing to the front desk to try and schedule pt in September with Dr Byrunm. NFN

## 2024-01-29 NOTE — Telephone Encounter (Signed)
 Called and spoke to pt. Full name and DOB verified. Went over stress test results - pt aware and was very thankful for call.  Oscar Oneil BROCKS, MD to Oscar Robinson     01/15/24  9:58 AM Normal study Low risk stress test Reassuring Normal pump function  Last read by Oscar Robinson at 1:14PM on 01/27/2024.

## 2024-02-03 DIAGNOSIS — E1165 Type 2 diabetes mellitus with hyperglycemia: Secondary | ICD-10-CM | POA: Diagnosis not present

## 2024-02-03 DIAGNOSIS — N400 Enlarged prostate without lower urinary tract symptoms: Secondary | ICD-10-CM | POA: Diagnosis not present

## 2024-02-03 DIAGNOSIS — E7800 Pure hypercholesterolemia, unspecified: Secondary | ICD-10-CM | POA: Diagnosis not present

## 2024-02-03 DIAGNOSIS — E02 Subclinical iodine-deficiency hypothyroidism: Secondary | ICD-10-CM | POA: Diagnosis not present

## 2024-02-03 DIAGNOSIS — E049 Nontoxic goiter, unspecified: Secondary | ICD-10-CM | POA: Diagnosis not present

## 2024-02-03 DIAGNOSIS — Z9641 Presence of insulin pump (external) (internal): Secondary | ICD-10-CM | POA: Diagnosis not present

## 2024-02-03 DIAGNOSIS — R5382 Chronic fatigue, unspecified: Secondary | ICD-10-CM | POA: Diagnosis not present

## 2024-02-03 DIAGNOSIS — I10 Essential (primary) hypertension: Secondary | ICD-10-CM | POA: Diagnosis not present

## 2024-02-18 ENCOUNTER — Telehealth: Payer: Self-pay

## 2024-02-18 DIAGNOSIS — J439 Emphysema, unspecified: Secondary | ICD-10-CM

## 2024-02-18 NOTE — Telephone Encounter (Signed)
 Copied from CRM [id number]. Topic: General - Other >> Feb 18, 2024  1:09 PM Rilla B wrote: Reason for CRM: Patient is calling to request a referral to go back to rehab at [facility]. Please call patient to discuss.   ----------------------------------------------------------------------- From previous Reason for Contact - Med. Prior Auth: Reason for CRM: Patient is calling to request a referral to go back to rehab at [facility].   Called and spoke with the patient. Pt reports he was staying active & going to the gym and then got sick in January and can tell he can't do as much as he was doing before getting sick/when he completed pulmonary rehab.  Dr. Shelah please advise pt would like referral to pulm rehab.

## 2024-02-19 NOTE — Telephone Encounter (Signed)
 Referral made to [facility] rehab at [facility]

## 2024-03-10 DIAGNOSIS — M7742 Metatarsalgia, left foot: Secondary | ICD-10-CM | POA: Diagnosis not present

## 2024-03-10 DIAGNOSIS — M7741 Metatarsalgia, right foot: Secondary | ICD-10-CM | POA: Diagnosis not present

## 2024-03-10 DIAGNOSIS — E119 Type 2 diabetes mellitus without complications: Secondary | ICD-10-CM | POA: Diagnosis not present

## 2024-03-30 ENCOUNTER — Ambulatory Visit (INDEPENDENT_AMBULATORY_CARE_PROVIDER_SITE_OTHER)
Admission: RE | Admit: 2024-03-30 | Discharge: 2024-03-30 | Disposition: A | Discharge status: Home / Self Care | Source: Ambulatory Visit | Attending: Family Medicine | Admitting: Family Medicine

## 2024-03-30 ENCOUNTER — Other Ambulatory Visit (HOSPITAL_BASED_OUTPATIENT_CLINIC_OR_DEPARTMENT_OTHER): Payer: Self-pay | Admitting: Family Medicine

## 2024-03-30 DIAGNOSIS — M25541 Pain in joints of right hand: Secondary | ICD-10-CM | POA: Diagnosis not present

## 2024-03-30 DIAGNOSIS — M25542 Pain in joints of left hand: Secondary | ICD-10-CM | POA: Diagnosis not present

## 2024-03-30 DIAGNOSIS — M19041 Primary osteoarthritis, right hand: Secondary | ICD-10-CM | POA: Diagnosis not present

## 2024-03-30 DIAGNOSIS — M79641 Pain in right hand: Secondary | ICD-10-CM | POA: Diagnosis not present

## 2024-03-30 DIAGNOSIS — M1811 Unilateral primary osteoarthritis of first carpometacarpal joint, right hand: Secondary | ICD-10-CM | POA: Diagnosis not present

## 2024-04-16 DIAGNOSIS — Z79899 Other long term (current) drug therapy: Secondary | ICD-10-CM | POA: Diagnosis not present

## 2024-04-16 DIAGNOSIS — I10 Essential (primary) hypertension: Secondary | ICD-10-CM | POA: Diagnosis not present

## 2024-04-16 DIAGNOSIS — M25541 Pain in joints of right hand: Secondary | ICD-10-CM | POA: Diagnosis not present

## 2024-04-16 DIAGNOSIS — E7801 Familial hypercholesterolemia: Secondary | ICD-10-CM | POA: Diagnosis not present

## 2024-04-16 DIAGNOSIS — Z125 Encounter for screening for malignant neoplasm of prostate: Secondary | ICD-10-CM | POA: Diagnosis not present

## 2024-04-23 ENCOUNTER — Other Ambulatory Visit: Payer: Self-pay | Admitting: Cardiology

## 2024-04-23 ENCOUNTER — Encounter: Payer: Self-pay | Admitting: Emergency Medicine

## 2024-04-23 ENCOUNTER — Ambulatory Visit: Admitting: Emergency Medicine

## 2024-04-23 VITALS — BP 128/68 | HR 80 | Ht 69.0 in | Wt 171.0 lb

## 2024-04-23 DIAGNOSIS — J00 Acute nasopharyngitis [common cold]: Secondary | ICD-10-CM

## 2024-04-23 DIAGNOSIS — Z23 Encounter for immunization: Secondary | ICD-10-CM

## 2024-04-23 DIAGNOSIS — J439 Emphysema, unspecified: Secondary | ICD-10-CM

## 2024-04-23 MED ORDER — AZITHROMYCIN 250 MG PO TABS: 250.0000 mg | ORAL_TABLET | Freq: Every day | ORAL | 11 refills | Status: AC

## 2024-04-23 NOTE — Progress Notes (Signed)
 HPI:   ROV 12/25/23 --78 year old man with a history of very severe COPD with associated chronic hypoxemic respiratory failure.  He has been managed on Symbicort  and Spiriva , Mucinex , fluticasone  nasal spray.  He was treated for an acute exacerbation in January with Depo-Medrol  and then prednisone . He did pulm rehab over a year ago and it did help his breathing and functional capacity. He went to the gym for a while after, but then fell off in December after the prolonged flare. He stays busy but his exertion is limited. He is supervising house remodeling. He uses albuterol  about 2-3x a day.   ROV 04/23/2024 --follow-up visit for Oscar Robinson.  He is 78 with very severe COPD. He transiently on O2 after a PNA, but is off of it now.  We have been managing him on Symbicort  and Spiriva .  He also takes Mucinex .  He uses fluticasone  nasal spray and loratadine  for allergic rhinitis.  I referred him to pulmonary rehab at [facility] back in July.  We tried starting him on Ohtuvayre  at our last visit in May. Today he reports that he stopped the Ohtuvayre  because he had increased UA irritation and also some dysphagia. Stopped it after about 1 week. He has been doing pulm rehab and feels that it helps a lot - he has regained a lot of his stamina. He is doing treadmill, weights. He is pre-treating gym w albuterol .    EXAM:  Vitals:   04/23/24 1556  BP: 128/68  Pulse: 80  SpO2: 94%  Weight: 171 lb (77.6 kg)  Height: 5' 9 (1.753 m)    Gen: Pleasant, well-nourished, in no distress,  normal affect  ENT: No lesions,  mouth clear,  oropharynx clear, no postnasal drip  Neck: No JVD, no stridor  Lungs: No use of accessory muscles, bilateral expiratory wheezes  Cardiovascular: RRR, heart sounds normal, no murmur or gallops  Musculoskeletal: No deformities, no cyanosis or clubbing  Neuro: alert, non focal  Skin: Warm, no lesions or rashes   GOLD COPD C His last flare was in January, none since I saw him in May.   Overall doing better since he started pulmonary rehab at [facility].  He is going to continue a maintenance program.  I think will be reasonable to add azithromycin  to see if he gets benefit.  Ultimately as he progresses he may end up needing to be on every day prednisone  but I do not think we need to start this for now.  He did not tolerate the Ohtuvayre .  Please continue your Symbicort  and your Spiriva  as you have been taking them.  Remember to rinse and gargle after using the Symbicort . Please continue to use your albuterol  2 puffs up to every 4 hours if needed for shortness of breath, chest tightness, wheezing.  It is a good idea to take this about 15 minutes before going to the gym for your workout. Please start azithromycin  250 mg once daily. Flu shot today. Get your COVID-19 vaccine this fall Keep up the good work with your pulmonary rehab Continue Mucinex  Follow with Dr. Shelah in 6 months.  Please call sooner if you have any problems.  Rhinitis Continue your fluticasone  nasal spray and your loratadine  as you have been taking them   Time spent 30 minutes reviewing symptoms, discussing medications, therapeutic options and potential changes to his medications.    Lamar Shelah, MD, PhD 04/23/2024, 4:34 PM [facility] [phone] or if no answer [phone]

## 2024-04-23 NOTE — Assessment & Plan Note (Signed)
 His last flare was in January, none since I saw him in May.  Overall doing better since he started pulmonary rehab at [facility].  He is going to continue a maintenance program.  I think will be reasonable to add azithromycin  to see if he gets benefit.  Ultimately as he progresses he may end up needing to be on every day prednisone  but I do not think we need to start this for now.  He did not tolerate the Ohtuvayre .  Please continue your Symbicort  and your Spiriva  as you have been taking them.  Remember to rinse and gargle after using the Symbicort . Please continue to use your albuterol  2 puffs up to every 4 hours if needed for shortness of breath, chest tightness, wheezing.  It is a good idea to take this about 15 minutes before going to the gym for your workout. Please start azithromycin  250 mg once daily. Flu shot today. Get your COVID-19 vaccine this fall Keep up the good work with your pulmonary rehab Continue Mucinex  Follow with Dr. Shelah in 6 months.  Please call sooner if you have any problems.

## 2024-04-23 NOTE — Assessment & Plan Note (Signed)
Continue your fluticasone nasal spray and your loratadine as you have been taking them.

## 2024-04-23 NOTE — Patient Instructions (Signed)
 Please continue your Symbicort  and your Spiriva  as you have been taking them.  Remember to rinse and gargle after using the Symbicort . Please continue to use your albuterol  2 puffs up to every 4 hours if needed for shortness of breath, chest tightness, wheezing.  It is a good idea to take this about 15 minutes before going to the gym for your workout. Please start azithromycin  250 mg once daily. Flu shot today. Get your COVID-19 vaccine this fall Keep up the good work with your pulmonary rehab Continue Mucinex  Continue your fluticasone  nasal spray and your loratadine  as you have been taking them Follow with Dr. Shelah in 6 months.  Please call sooner if you have any problems.

## 2024-05-01 ENCOUNTER — Other Ambulatory Visit: Payer: Self-pay | Admitting: Cardiology

## 2024-05-05 DIAGNOSIS — E02 Subclinical iodine-deficiency hypothyroidism: Secondary | ICD-10-CM | POA: Diagnosis not present

## 2024-05-05 DIAGNOSIS — E1165 Type 2 diabetes mellitus with hyperglycemia: Secondary | ICD-10-CM | POA: Diagnosis not present

## 2024-05-05 DIAGNOSIS — E7800 Pure hypercholesterolemia, unspecified: Secondary | ICD-10-CM | POA: Diagnosis not present

## 2024-05-12 DIAGNOSIS — E049 Nontoxic goiter, unspecified: Secondary | ICD-10-CM | POA: Diagnosis not present

## 2024-05-12 DIAGNOSIS — I10 Essential (primary) hypertension: Secondary | ICD-10-CM | POA: Diagnosis not present

## 2024-05-12 DIAGNOSIS — Z9641 Presence of insulin pump (external) (internal): Secondary | ICD-10-CM | POA: Diagnosis not present

## 2024-05-12 DIAGNOSIS — E7800 Pure hypercholesterolemia, unspecified: Secondary | ICD-10-CM | POA: Diagnosis not present

## 2024-05-12 DIAGNOSIS — E02 Subclinical iodine-deficiency hypothyroidism: Secondary | ICD-10-CM | POA: Diagnosis not present

## 2024-05-12 DIAGNOSIS — R5382 Chronic fatigue, unspecified: Secondary | ICD-10-CM | POA: Diagnosis not present

## 2024-05-12 DIAGNOSIS — E1165 Type 2 diabetes mellitus with hyperglycemia: Secondary | ICD-10-CM | POA: Diagnosis not present

## 2024-05-29 ENCOUNTER — Other Ambulatory Visit: Payer: Self-pay | Admitting: Emergency Medicine

## 2024-05-29 DIAGNOSIS — J449 Chronic obstructive pulmonary disease, unspecified: Secondary | ICD-10-CM

## 2024-06-09 ENCOUNTER — Other Ambulatory Visit: Payer: Self-pay | Admitting: Cardiology

## 2024-06-15 DIAGNOSIS — D225 Melanocytic nevi of trunk: Secondary | ICD-10-CM | POA: Diagnosis not present

## 2024-06-15 DIAGNOSIS — L570 Actinic keratosis: Secondary | ICD-10-CM | POA: Diagnosis not present

## 2024-06-15 DIAGNOSIS — L578 Other skin changes due to chronic exposure to nonionizing radiation: Secondary | ICD-10-CM | POA: Diagnosis not present

## 2024-06-15 DIAGNOSIS — L820 Inflamed seborrheic keratosis: Secondary | ICD-10-CM | POA: Diagnosis not present

## 2024-06-15 DIAGNOSIS — D2239 Melanocytic nevi of other parts of face: Secondary | ICD-10-CM | POA: Diagnosis not present

## 2024-06-22 DIAGNOSIS — E119 Type 2 diabetes mellitus without complications: Secondary | ICD-10-CM | POA: Diagnosis not present

## 2024-06-25 DIAGNOSIS — H25812 Combined forms of age-related cataract, left eye: Secondary | ICD-10-CM | POA: Diagnosis not present

## 2024-06-25 DIAGNOSIS — Z01818 Encounter for other preprocedural examination: Secondary | ICD-10-CM | POA: Diagnosis not present

## 2024-06-25 DIAGNOSIS — Z961 Presence of intraocular lens: Secondary | ICD-10-CM | POA: Diagnosis not present

## 2024-06-30 ENCOUNTER — Telehealth: Payer: Self-pay | Admitting: Emergency Medicine

## 2024-06-30 DIAGNOSIS — J449 Chronic obstructive pulmonary disease, unspecified: Secondary | ICD-10-CM

## 2024-06-30 NOTE — Telephone Encounter (Signed)
 This was not received.  Called and spoke with [facility] and provided B pod fax number and asked them to re-fax this.  Will await fax.

## 2024-06-30 NOTE — Telephone Encounter (Signed)
 I have received the form . RB can sign this when he is back in office 11/26.

## 2024-06-30 NOTE — Telephone Encounter (Signed)
 Copied from CRM #[id number]. Topic: General - Other >> Jun 30, 2024  9:50 AM Benton KIDD wrote: Reason for CRM: [facility] pharmacy we had sent a prescription renewal for albuterol  nebulizer solution and equipment this was sent on november 18th and wanted to check and make sure it was received . this is for dr byrum . Will refax to alternate fax number  Phone [phone] Fax number [phone]

## 2024-07-16 DIAGNOSIS — Z794 Long term (current) use of insulin: Secondary | ICD-10-CM | POA: Diagnosis not present

## 2024-07-16 DIAGNOSIS — Z7982 Long term (current) use of aspirin: Secondary | ICD-10-CM | POA: Diagnosis not present

## 2024-07-16 DIAGNOSIS — L03116 Cellulitis of left lower limb: Secondary | ICD-10-CM | POA: Diagnosis not present

## 2024-07-16 DIAGNOSIS — Z79899 Other long term (current) drug therapy: Secondary | ICD-10-CM | POA: Diagnosis not present

## 2024-07-16 DIAGNOSIS — Z7984 Long term (current) use of oral hypoglycemic drugs: Secondary | ICD-10-CM | POA: Diagnosis not present

## 2024-07-22 DIAGNOSIS — J209 Acute bronchitis, unspecified: Secondary | ICD-10-CM | POA: Diagnosis not present

## 2024-07-25 ENCOUNTER — Other Ambulatory Visit: Payer: Self-pay | Admitting: Cardiology

## 2024-07-28 ENCOUNTER — Other Ambulatory Visit: Payer: Self-pay | Admitting: Cardiology

## 2024-08-24 ENCOUNTER — Other Ambulatory Visit: Payer: Self-pay | Admitting: Emergency Medicine

## 2024-08-24 DIAGNOSIS — J449 Chronic obstructive pulmonary disease, unspecified: Secondary | ICD-10-CM

## 2024-08-27 ENCOUNTER — Ambulatory Visit: Payer: Self-pay

## 2024-08-27 NOTE — Telephone Encounter (Signed)
 FYI Only or Action Required?: FYI only for provider: appointment scheduled on 09/02/24.  Patient is followed in Pulmonology for COPD, last seen on 04/23/2024 by Shelah Lamar RAMAN, MD.  Called Nurse Triage reporting Requesting O2 at home .  Symptoms began yesterday.  Interventions attempted: Rescue inhaler.  Symptoms are: completely resolved.  Triage Disposition: See PCP Within 2 Weeks  Patient/caregiver understands and will follow disposition?: yes     Message from Patoka B sent at 08/27/2024  3:22 PM EST  Reason for Triage: Breathing issues.  Asking for oxygen  at home   Reason for Disposition  [1] MILD longstanding difficulty breathing (e.g., minimal/no SOB at rest, SOB with walking, pulse < 100) AND [2] SAME as normal  Answer Assessment - Initial Assessment Questions Pt called wanting O2 for night use to use as needed. Pt stated he was exposed to paint fumes and caused him to be SOB. Pt stated it scared him because he was that SOB when he was hospitalized. He stated he was wanting it just to have on hand. No sx today.      1. RESPIRATORY STATUS: Describe your breathing? (e.g., wheezing, shortness of breath, unable to speak, severe coughing)      Transient episode SOB after inhaling paint fumes for 30 minutes without mask  2. ONSET: When did this breathing problem begin?      Just 08/27/24 none today  3. PATTERN Does the difficult breathing come and go, or has it been constant since it started?      Has come and gone  4. SEVERITY: How bad is your breathing? (e.g., mild, moderate, severe)      At no distress 5. RECURRENT SYMPTOM: Have you had difficulty breathing before? If Yes, ask: When was the last time? and What happened that time?      When had pneumonia and was in hospital  6. CARDIAC HISTORY: Do you have any history of heart disease? (e.g., heart attack, angina, bypass surgery, angioplasty)      CAD 7. LUNG HISTORY: Do you have any history of lung  disease?  (e.g., pulmonary embolus, asthma, emphysema)     COPD 8. CAUSE: What do you think is causing the breathing problem?      Pt thinks it was pain fumes 9. OTHER SYMPTOMS: Do you have any other symptoms? (e.g., chest pain, cough, dizziness, fever, runny nose)    denies 10. O2 SATURATION MONITOR:  Do you use an oxygen  saturation monitor (pulse oximeter) at home? If Yes, ask: What is your reading (oxygen  level) today? What is your usual oxygen  saturation reading? (e.g., 95%)       Doesn't have with him  Protocols used: Breathing Difficulty-A-AH

## 2024-08-28 NOTE — Telephone Encounter (Signed)
 Noted. NFN

## 2024-09-02 ENCOUNTER — Ambulatory Visit: Admitting: Emergency Medicine

## 2024-09-24 ENCOUNTER — Ambulatory Visit: Admitting: Emergency Medicine
